# Patient Record
Sex: Male | Born: 1940 | Race: Black or African American | Hispanic: No | Marital: Married | Smoking: Former smoker
Health system: Southern US, Community
[De-identification: ages and names within clinical notes are randomized; demographics above are authoritative.]

## PROBLEM LIST (undated history)

## (undated) DIAGNOSIS — Z860100 Personal history of colon polyps, unspecified: Secondary | ICD-10-CM

## (undated) DIAGNOSIS — I2510 Atherosclerotic heart disease of native coronary artery without angina pectoris: Secondary | ICD-10-CM

## (undated) DIAGNOSIS — R7303 Prediabetes: Secondary | ICD-10-CM

## (undated) DIAGNOSIS — I10 Essential (primary) hypertension: Secondary | ICD-10-CM

## (undated) DIAGNOSIS — M1990 Unspecified osteoarthritis, unspecified site: Secondary | ICD-10-CM

## (undated) DIAGNOSIS — K5732 Diverticulitis of large intestine without perforation or abscess without bleeding: Secondary | ICD-10-CM

## (undated) DIAGNOSIS — R739 Hyperglycemia, unspecified: Secondary | ICD-10-CM

## (undated) DIAGNOSIS — K627 Radiation proctitis: Secondary | ICD-10-CM

## (undated) DIAGNOSIS — E785 Hyperlipidemia, unspecified: Secondary | ICD-10-CM

## (undated) DIAGNOSIS — E119 Type 2 diabetes mellitus without complications: Secondary | ICD-10-CM

## (undated) DIAGNOSIS — C61 Malignant neoplasm of prostate: Secondary | ICD-10-CM

## (undated) DIAGNOSIS — L02419 Cutaneous abscess of limb, unspecified: Secondary | ICD-10-CM

## (undated) HISTORY — DX: Cutaneous abscess of limb, unspecified: L02.419

## (undated) HISTORY — PX: COLONOSCOPY W/ POLYPECTOMY: SHX1380

## (undated) HISTORY — DX: Malignant neoplasm of prostate: C61

## (undated) HISTORY — DX: Atherosclerotic heart disease of native coronary artery without angina pectoris: I25.10

## (undated) HISTORY — DX: Essential (primary) hypertension: I10

## (undated) HISTORY — DX: Personal history of colonic polyps: Z86.010

## (undated) HISTORY — DX: Type 2 diabetes mellitus without complications: E11.9

## (undated) HISTORY — DX: Unspecified osteoarthritis, unspecified site: M19.90

## (undated) HISTORY — DX: Personal history of colon polyps, unspecified: Z86.0100

## (undated) HISTORY — DX: Radiation proctitis: K62.7

## (undated) HISTORY — DX: Diverticulitis of large intestine without perforation or abscess without bleeding: K57.32

## (undated) HISTORY — PX: COLONOSCOPY: SHX174

## (undated) HISTORY — DX: Hyperglycemia, unspecified: R73.9

## (undated) HISTORY — DX: Hyperlipidemia, unspecified: E78.5

---

## 2002-11-22 DIAGNOSIS — C61 Malignant neoplasm of prostate: Secondary | ICD-10-CM

## 2002-11-22 HISTORY — DX: Malignant neoplasm of prostate: C61

## 2004-01-20 ENCOUNTER — Encounter: Admission: RE | Admit: 2004-01-20 | Discharge: 2004-04-19 | Payer: Self-pay | Admitting: Internal Medicine

## 2004-09-14 ENCOUNTER — Ambulatory Visit (HOSPITAL_COMMUNITY): Admission: RE | Admit: 2004-09-14 | Discharge: 2004-09-14 | Payer: Self-pay | Admitting: Urology

## 2004-09-14 IMAGING — CR DG ABDOMEN 1V
1 series · 1 of 1 positions shown · non-contrast
Comparison: none

CLINICAL DATA: Left ureteral calculus. Preop for lithotripsy.

[view not recorded]
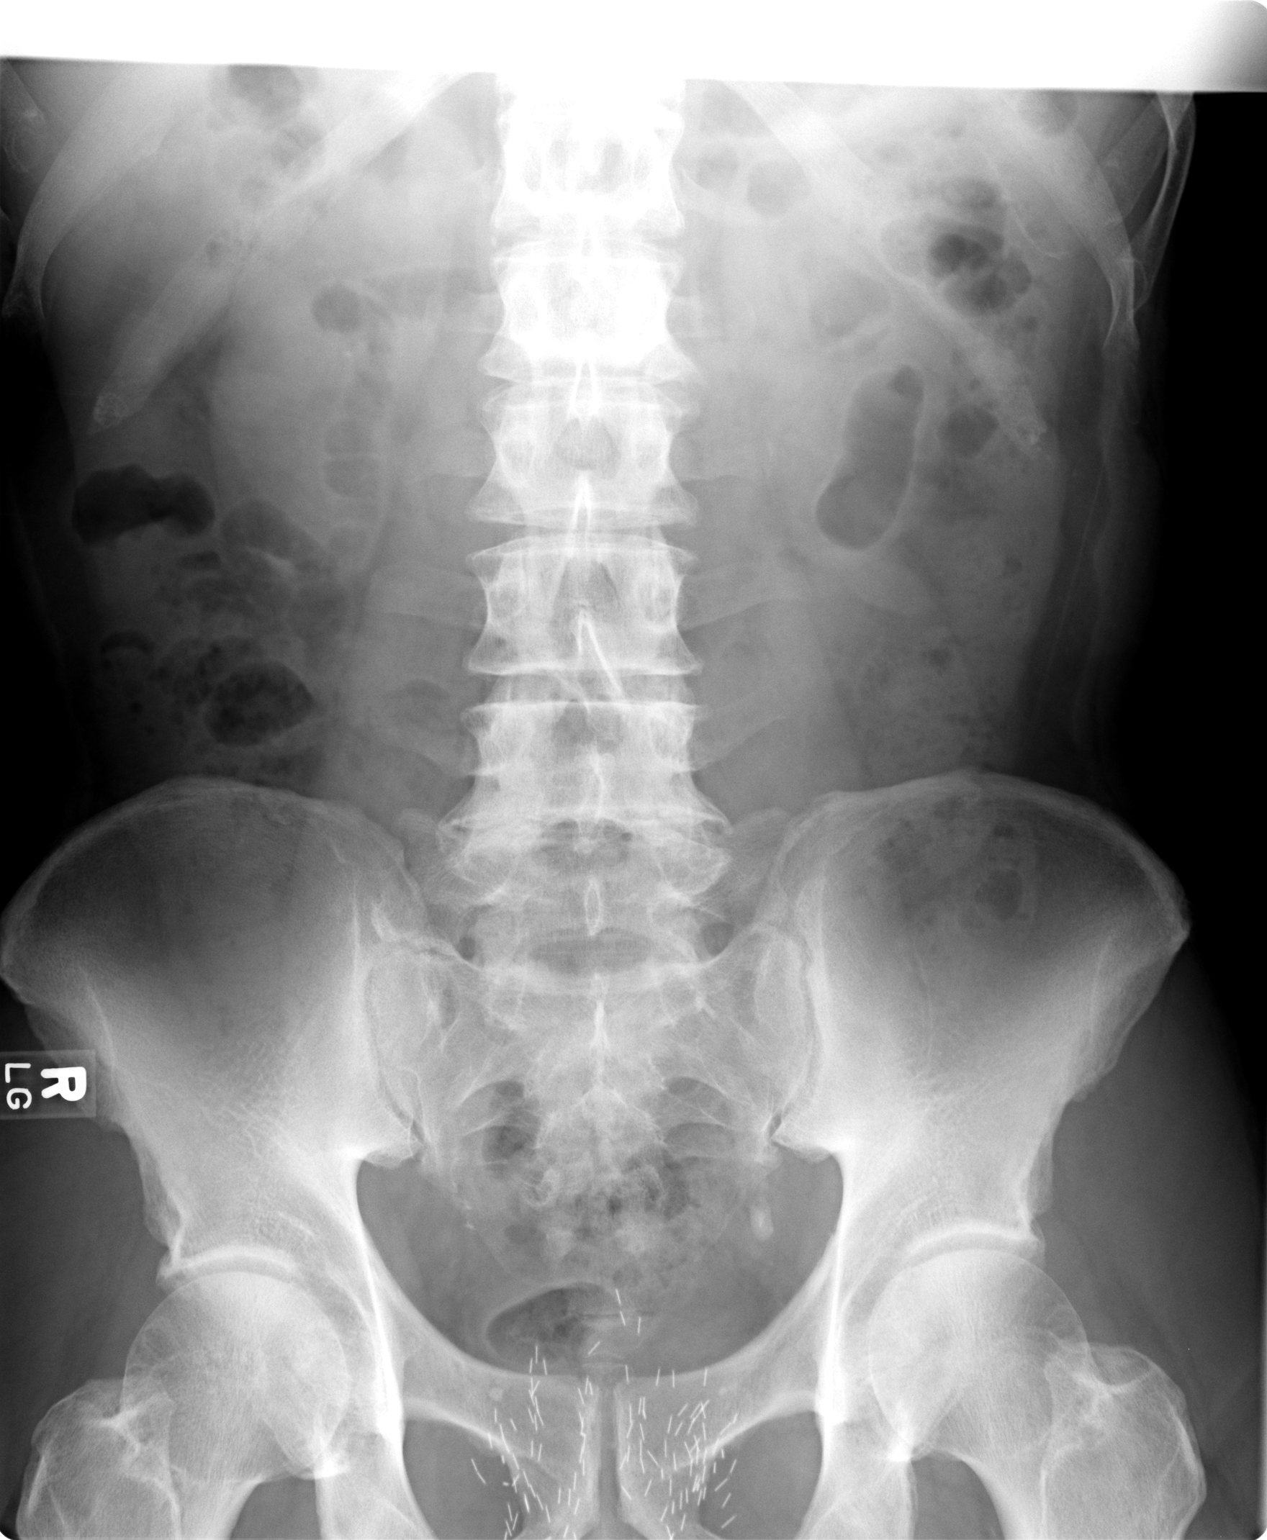

[1 of 1 positions shown; findings below may reference images not displayed]

ABDOMEN - ONE VIEW

There are no prior studies for comparison.

A tiny 3 mm calculus is noted in the mid pole of the right kidney, and a probable tiny 2 mm
calculus is also noted in the upper pole of the left kidney.

A large ureteral calcification is seen in the left pelvis over the expected course of the distal
left ureter and this measures 5 x 12 mm. Several tiny calcifications are also seen in the right
pelvis, which are of uncertain etiology. These may represent vascular calcifications, although
ureteral calculi cannot definitely be excluded. 

There is no evidence of dilated bowel loops. Prostate radioactive seed implants are noted.

IMPRESSION 

1. 5 x 12 mm left pelvic calcification, suspicious for distal left ureteral calculus. 

2. Several tiny calcifications in the right pelvis are indeterminate, and may represent distal
right ureteral calculi versus vascular calcification.

3. Tiny bilateral intrarenal calculi.

## 2005-09-21 ENCOUNTER — Ambulatory Visit: Payer: Self-pay | Admitting: Internal Medicine

## 2005-10-06 ENCOUNTER — Encounter (INDEPENDENT_AMBULATORY_CARE_PROVIDER_SITE_OTHER): Payer: Self-pay | Admitting: *Deleted

## 2005-10-06 ENCOUNTER — Ambulatory Visit: Payer: Self-pay | Admitting: Internal Medicine

## 2007-07-12 ENCOUNTER — Ambulatory Visit: Payer: Self-pay | Admitting: Vascular Surgery

## 2008-04-17 ENCOUNTER — Observation Stay: Payer: TRICARE For Life (TFL) | Admitting: Internal Medicine

## 2008-04-17 ENCOUNTER — Other Ambulatory Visit: Payer: Self-pay

## 2008-04-17 IMAGING — CT CT HEAD WITHOUT CONTRAST
2 series · 16 of 30 positions shown, 20 images · non-contrast
Comparison: none

REASON FOR EXAM: dizzy
COMMENTS:

[Series 2: without · axial · non-contrast · 0.40mm/px · z∈[+8,+128]mm · 13 of 29 slices shown, 17 images]
[im 3/29  brain]
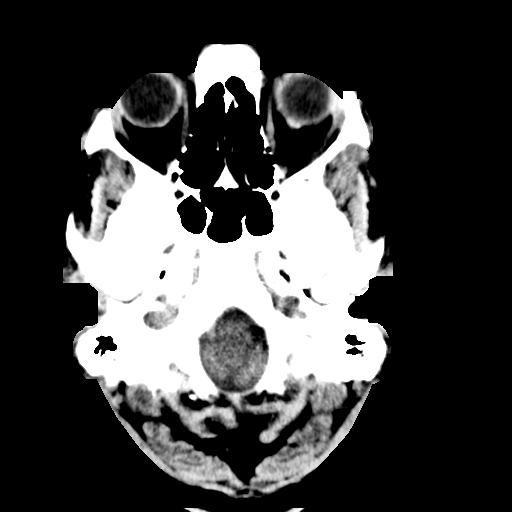
[im 3/29  bone]
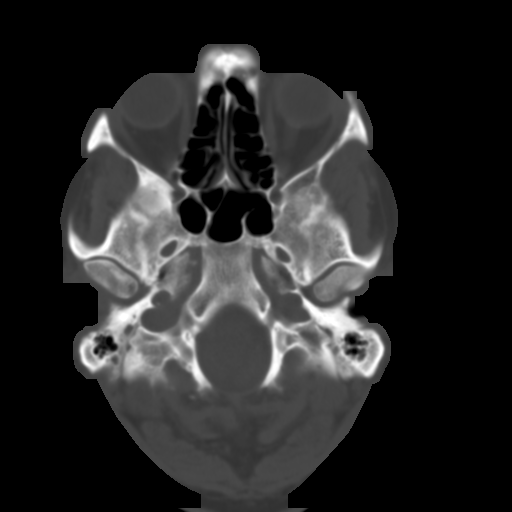
[im 5/29  brain]
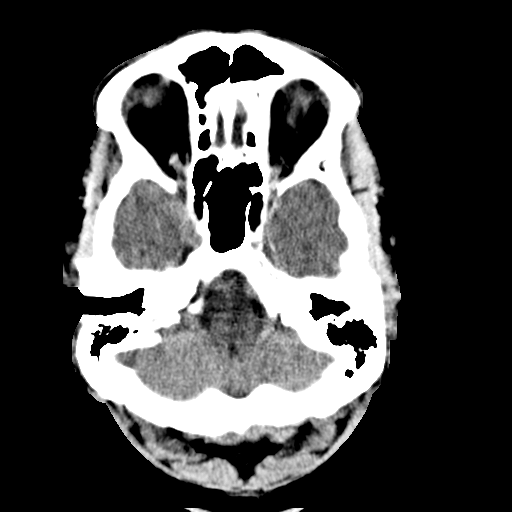
[im 7/29  brain]
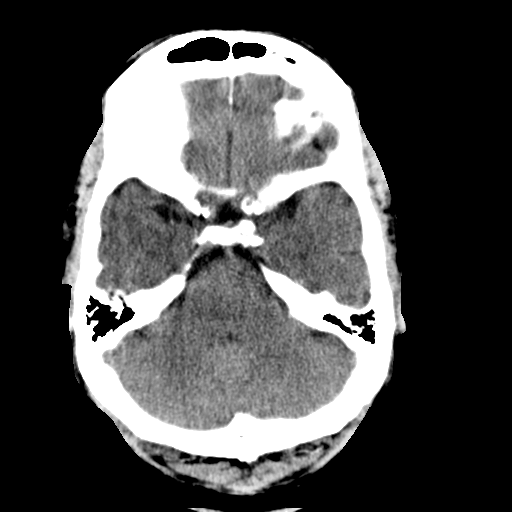
[im 9/29  brain]
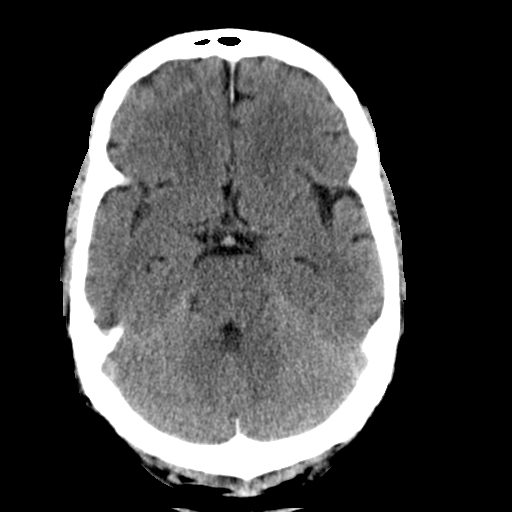
[im 11/29  brain]
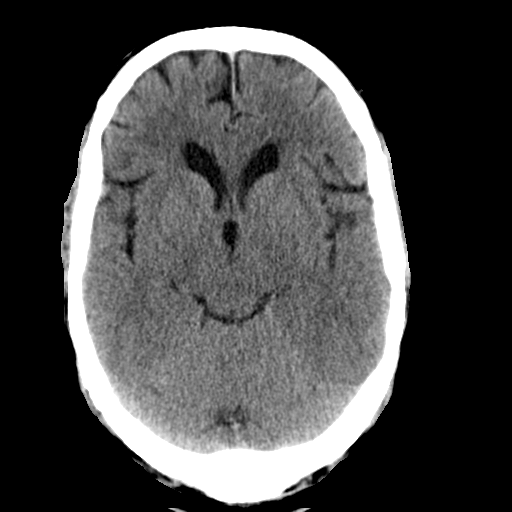
[im 11/29  bone]
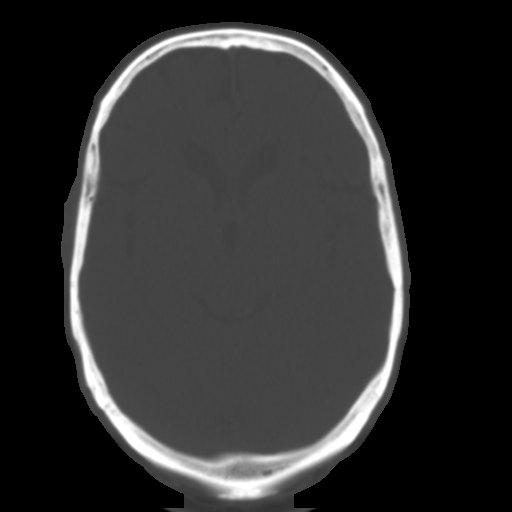
[im 13/29  brain]
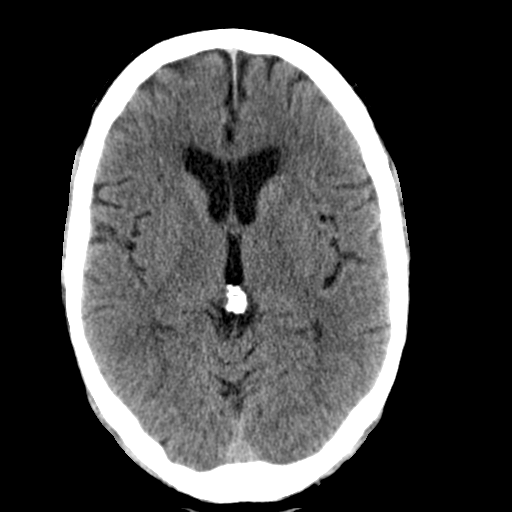
[im 15/29  brain]
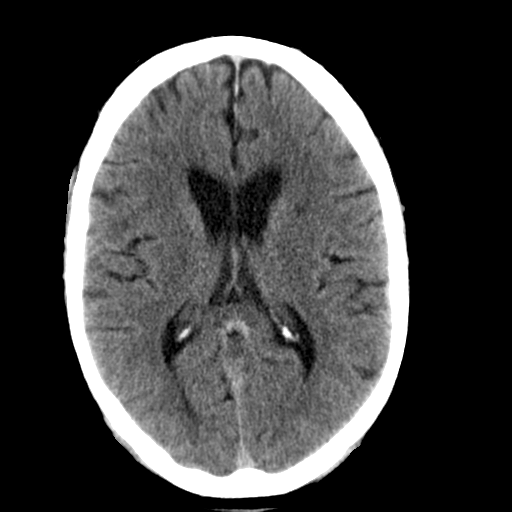
[im 17/29  brain]
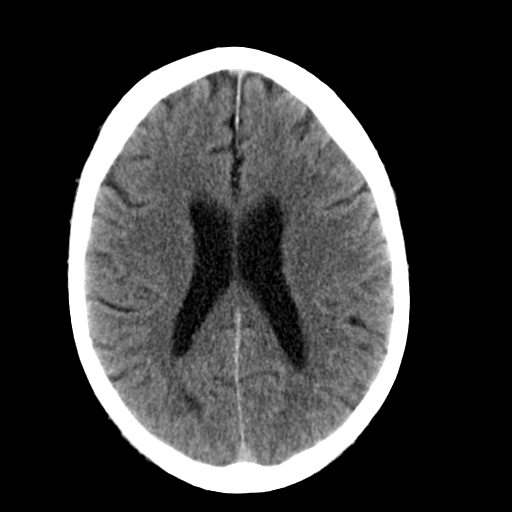
[im 19/29  brain]
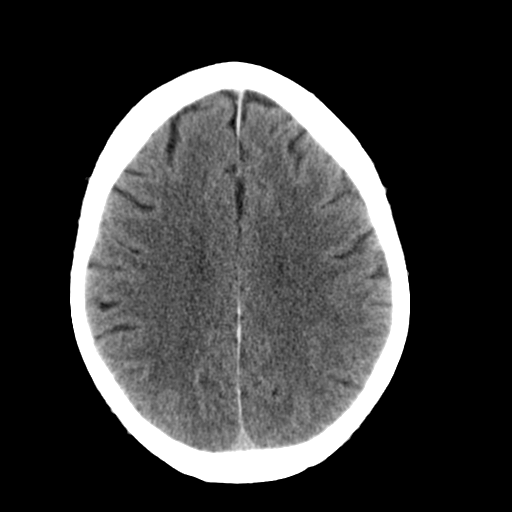
[im 19/29  bone]
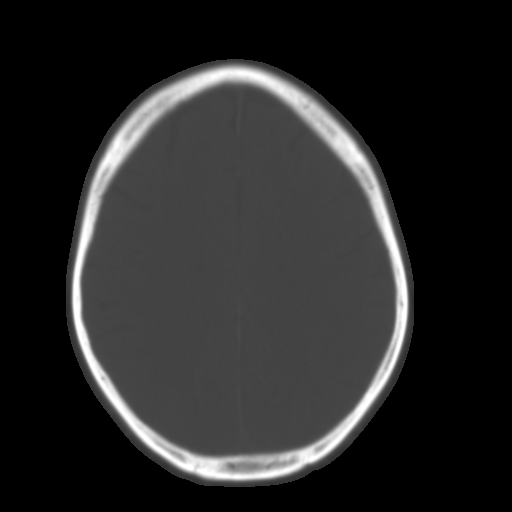
[im 21/29  brain]
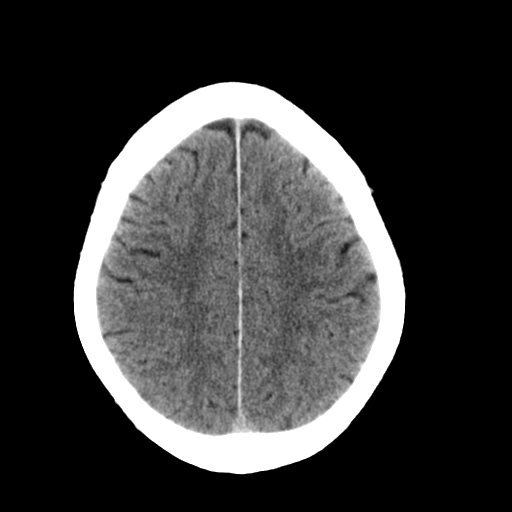
[im 23/29  brain]
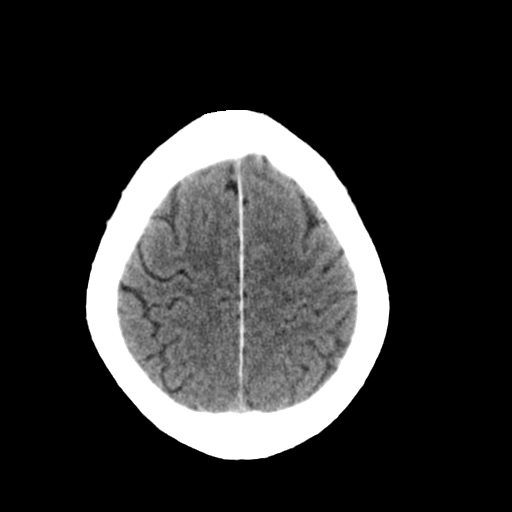
[im 25/29  brain]
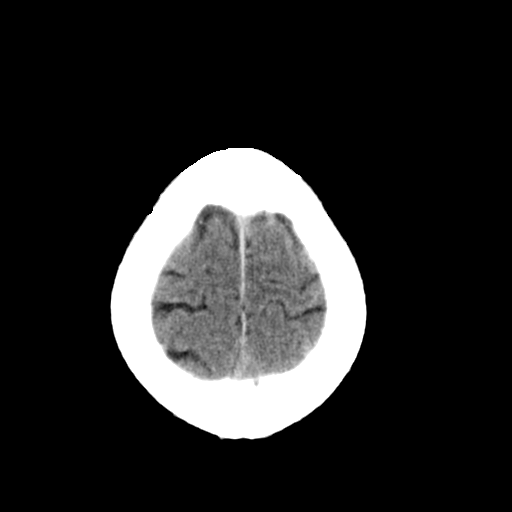
[im 27/29  brain]
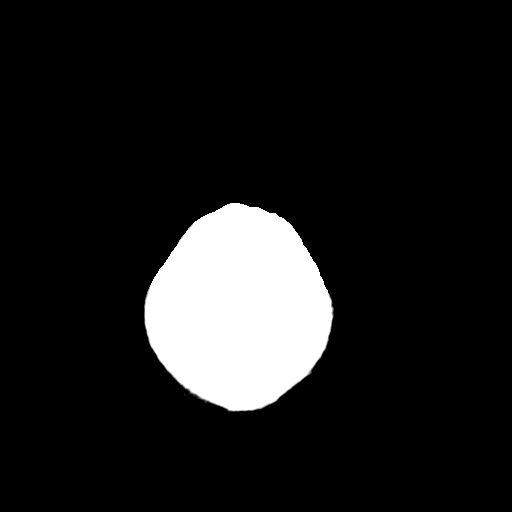
[im 27/29  bone]
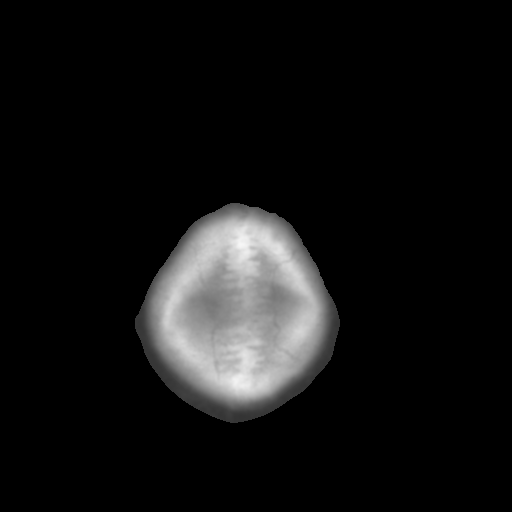

[Series 3: bone · axial · 0.40mm/px · z∈[+8,+48]mm · 3 of 29 slices shown]
[im 3/29  bone]
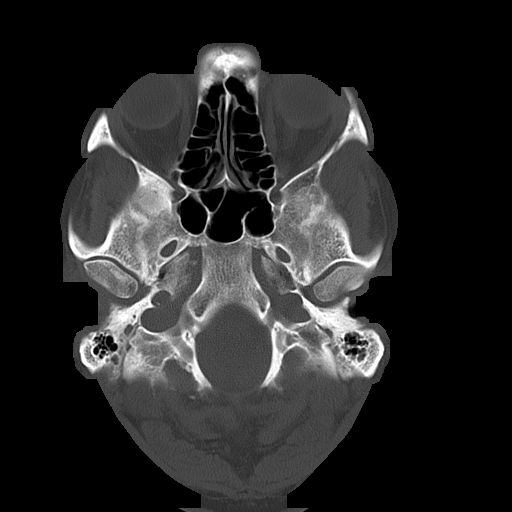
[im 7/29  bone]
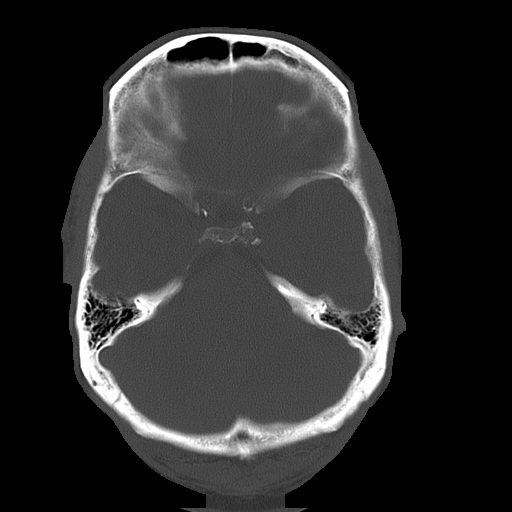
[im 11/29  bone]
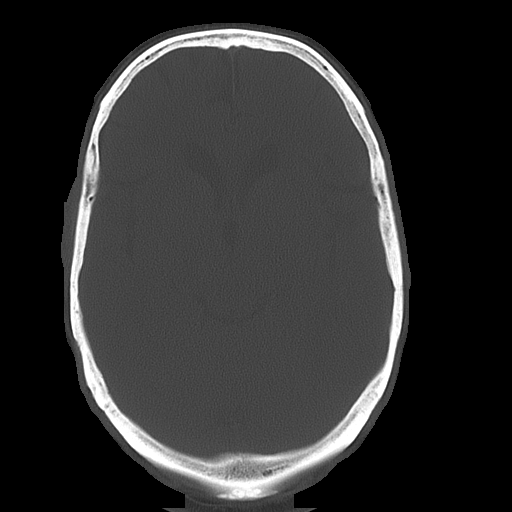

[16 of 30 positions shown; findings below may reference images not displayed]

PROCEDURE:     CT  - CT HEAD WITHOUT CONTRAST  - [DATE]  [DATE]

RESULT:     There is no evidence of intraaxial nor extraaxial fluid
collections nor evidence of acute hemorrhage. No secondary signs are
appreciated to suggest mass-effect, subacute or chronic infarction. The
visualized bony skeleton demonstrates no evidence of fracture nor
dislocation.
IMPRESSION: Head CT without evidence of focal or acute intracranial
abnormalities.

Dr. XONN, of the emergency department, was informed of these findings at
the time of the initial interpretation.

## 2008-04-22 HISTORY — PX: CORONARY ANGIOPLASTY WITH STENT PLACEMENT: SHX49

## 2008-04-26 ENCOUNTER — Ambulatory Visit: Payer: Self-pay | Admitting: Cardiology

## 2008-04-26 LAB — CONVERTED CEMR LAB
ALT: 32 units/L (ref 0–53)
AST: 23 units/L (ref 0–37)
Albumin: 4.8 g/dL (ref 3.5–5.2)
Alkaline Phosphatase: 43 units/L (ref 39–117)
BUN: 18 mg/dL (ref 6–23)
Basophils Absolute: 0 10*3/uL (ref 0.0–0.1)
Basophils Relative: 1 % (ref 0–1)
CO2: 24 meq/L (ref 19–32)
Calcium: 10.1 mg/dL (ref 8.4–10.5)
Chloride: 106 meq/L (ref 96–112)
Creatinine, Ser: 1.12 mg/dL (ref 0.40–1.50)
Eosinophils Absolute: 0.1 10*3/uL (ref 0.0–0.7)
Eosinophils Relative: 2 % (ref 0–5)
Glucose, Bld: 85 mg/dL (ref 70–99)
HCT: 46 % (ref 39.0–52.0)
Hemoglobin: 15.3 g/dL (ref 13.0–17.0)
INR: 1.1 (ref 0.0–1.5)
Lymphocytes Relative: 53 % — ABNORMAL HIGH (ref 12–46)
Lymphs Abs: 1.8 10*3/uL (ref 0.7–4.0)
MCHC: 33.3 g/dL (ref 30.0–36.0)
MCV: 79 fL (ref 78.0–100.0)
Monocytes Absolute: 0.4 10*3/uL (ref 0.1–1.0)
Monocytes Relative: 11 % (ref 3–12)
Neutro Abs: 1.2 10*3/uL — ABNORMAL LOW (ref 1.7–7.7)
Neutrophils Relative %: 34 % — ABNORMAL LOW (ref 43–77)
Platelets: 158 10*3/uL (ref 150–400)
Potassium: 3.8 meq/L (ref 3.5–5.3)
Prothrombin Time: 14.1 s (ref 11.6–15.2)
RBC: 5.82 M/uL — ABNORMAL HIGH (ref 4.22–5.81)
RDW: 15.3 % (ref 11.5–15.5)
Sodium: 142 meq/L (ref 135–145)
Total Bilirubin: 0.5 mg/dL (ref 0.3–1.2)
Total Protein: 8 g/dL (ref 6.0–8.3)
WBC: 3.4 10*3/uL — ABNORMAL LOW (ref 4.0–10.5)
aPTT: 35 s (ref 24–37)

## 2008-05-02 ENCOUNTER — Ambulatory Visit: Payer: Self-pay | Admitting: Cardiovascular Disease

## 2008-05-02 ENCOUNTER — Inpatient Hospital Stay (HOSPITAL_COMMUNITY): Admission: RE | Admit: 2008-05-02 | Discharge: 2008-05-03 | Payer: Self-pay | Admitting: Cardiovascular Disease

## 2008-05-02 ENCOUNTER — Encounter: Admission: RE | Admit: 2008-05-02 | Discharge: 2008-05-03 | Payer: Self-pay | Admitting: Cardiovascular Disease

## 2008-05-02 ENCOUNTER — Ambulatory Visit: Payer: Self-pay | Admitting: Cardiology

## 2008-05-02 ENCOUNTER — Inpatient Hospital Stay (HOSPITAL_BASED_OUTPATIENT_CLINIC_OR_DEPARTMENT_OTHER): Admission: RE | Admit: 2008-05-02 | Discharge: 2008-05-02 | Payer: Self-pay | Admitting: Cardiology

## 2008-05-13 ENCOUNTER — Ambulatory Visit: Payer: Self-pay | Admitting: Cardiology

## 2008-07-08 ENCOUNTER — Encounter: Payer: Self-pay | Admitting: Cardiology

## 2008-08-01 ENCOUNTER — Ambulatory Visit: Payer: Self-pay | Admitting: Internal Medicine

## 2008-08-02 ENCOUNTER — Encounter: Payer: Self-pay | Admitting: Cardiology

## 2008-08-02 LAB — CONVERTED CEMR LAB
BUN: 22 mg/dL (ref 6–23)
CO2: 24 meq/L (ref 19–32)
Calcium: 10 mg/dL (ref 8.4–10.5)
Chloride: 103 meq/L (ref 96–112)
Creatinine, Ser: 1.12 mg/dL (ref 0.40–1.50)
Glucose, Bld: 99 mg/dL (ref 70–99)
Potassium: 3.7 meq/L (ref 3.5–5.3)
Sodium: 140 meq/L (ref 135–145)

## 2008-08-05 ENCOUNTER — Ambulatory Visit: Payer: Self-pay | Admitting: Cardiology

## 2008-08-29 DIAGNOSIS — C61 Malignant neoplasm of prostate: Secondary | ICD-10-CM | POA: Insufficient documentation

## 2008-08-29 DIAGNOSIS — K5730 Diverticulosis of large intestine without perforation or abscess without bleeding: Secondary | ICD-10-CM | POA: Insufficient documentation

## 2008-08-29 DIAGNOSIS — I10 Essential (primary) hypertension: Secondary | ICD-10-CM | POA: Insufficient documentation

## 2008-08-29 DIAGNOSIS — D126 Benign neoplasm of colon, unspecified: Secondary | ICD-10-CM | POA: Insufficient documentation

## 2008-08-29 DIAGNOSIS — I2510 Atherosclerotic heart disease of native coronary artery without angina pectoris: Secondary | ICD-10-CM | POA: Insufficient documentation

## 2008-08-29 DIAGNOSIS — E785 Hyperlipidemia, unspecified: Secondary | ICD-10-CM

## 2008-08-29 DIAGNOSIS — E1169 Type 2 diabetes mellitus with other specified complication: Secondary | ICD-10-CM | POA: Insufficient documentation

## 2008-09-03 ENCOUNTER — Ambulatory Visit: Payer: Self-pay | Admitting: Internal Medicine

## 2008-09-03 DIAGNOSIS — K520 Gastroenteritis and colitis due to radiation: Secondary | ICD-10-CM | POA: Insufficient documentation

## 2008-09-10 ENCOUNTER — Ambulatory Visit: Payer: Self-pay | Admitting: Internal Medicine

## 2008-09-10 ENCOUNTER — Encounter: Payer: Self-pay | Admitting: Internal Medicine

## 2008-09-12 ENCOUNTER — Encounter: Payer: Self-pay | Admitting: Internal Medicine

## 2009-04-10 ENCOUNTER — Ambulatory Visit: Payer: Self-pay | Admitting: Cardiology

## 2010-03-30 ENCOUNTER — Ambulatory Visit: Payer: Self-pay | Admitting: Cardiology

## 2010-12-22 NOTE — Assessment & Plan Note (Signed)
Summary: 1 yr rov pfh   Visit Type:  Follow-up Primary Provider:  Marisue Brooklyn DO.  CC:  CAD.  History of Present Illness: The patient presents for followup of his known coronary disease. Since I last saw him his only complaint has been swelling in his ankles and wrists. He says this causes a little stiffness but is not overwhelmingly uncomfortable. He exercises a couple of times per week and denies any chest pressure, neck or arm discomfort. He denies any palpitations, presyncope or syncope. He denies any shortness of breath, PND or orthopnea. He did have lipids drawn recently but he forgot to bring these results.  Current Medications (verified): 1)  Plavix 75 Mg Tabs (Clopidogrel Bisulfate) .Marland Kitchen.. 1 Tablet By Mouth Once Daily 2)  Aspirin Ec 325 Mg Tbec (Aspirin) .Marland Kitchen.. 1 Tablet By Mouth Once Daily 3)  Flomax 0.4 Mg Xr24h-Cap (Tamsulosin Hcl) .Marland Kitchen.. 1 Tablet By Mouth Once Daily 4)  Amlodipine Besylate 10 Mg Tabs (Amlodipine Besylate) .Marland Kitchen.. 1 Tablet By Mouth Once Daily 5)  Citalopram Hydrobromide 40 Mg Tabs (Citalopram Hydrobromide) .... 1/2 Tablet By Mouth Once Daily 6)  Zolpidem Tartrate 10 Mg Tabs (Zolpidem Tartrate) .Marland Kitchen.. 1 Tablet By Mouth At Bedtime 7)  Hydrochlorothiazide 25 Mg Tabs (Hydrochlorothiazide) .Marland Kitchen.. 1 Tablet By Mouth Once Daily 8)  Vitamin B-6 50 Mg Tabs (Pyridoxine Hcl) .... Take One Tablet By Mouth Once Daily. 9)  Risperidone 2 Mg Tabs (Risperidone) .... 1/2 Tablet At Bedtime 10)  Simvastatin 80 Mg Tabs (Simvastatin) .... 1/2 Tablet By Mouth At Bedtime 11)  Nitroglycerin 0.4 Mg Subl (Nitroglycerin) .... One Tablet Under Tongue Every 5 Minutes As Needed For Chest Pain---May Repeat Times Three 12)  Ergocalciferol 50000 Unit Caps (Ergocalciferol) .Marland Kitchen.. 1 Tablet Q Week 13)  Cialis 10 Mg Tabs (Tadalafil) .... As Needed  Allergies (verified): No Known Drug Allergies  Past History:  Past Medical History: COLONIC POLYPS (ICD-211.3) DIVERTICULOSIS, COLON (ICD-562.10) CAD  (ICD-414.00) (99% stenosis in the mid-right coronary artery with 80% to 90% diffuse   stenosis more proximally.  There is also a 99% stenosis in the distal   left circumflex.  EF was 60% with mild inferior hypokinesis.  He then underwent successful PCI   and stenting of the left circumflex with placement of a Psychologist, occupational Platinum Study drug-eluting stent.) ADENOCARCINOMA, PROSTATE (ICD-185)status post seed implantation HYPERLIPIDEMIA (ICD-272.4) HYPERTENSION (ICD-401.9) Radiation proctitis with chronic bleeding  Past Surgical History: Reviewed history from 04/08/2009 and no changes required. Angioplasty/stent  6/09  Review of Systems       As stated in the HPI and negative for all other systems.   Vital Signs:  Patient profile:   70 year old male Height:      69 inches Weight:      191 pounds BMI:     28.31 Pulse rate:   62 / minute Resp:     16 per minute BP sitting:   138 / 70  (right arm)  Vitals Entered By: Marrion Coy, CNA (Mar 30, 2010 11:41 AM)  Physical Exam  General:  Well developed, well nourished, in no acute distress. Head:  normocephalic and atraumatic Eyes:  PERRLA/EOM intact; conjunctiva and lids normal. Mouth:  Teeth, gums and palate normal. Oral mucosa normal. Neck:  Neck supple, no JVD. No masses, thyromegaly or abnormal cervical nodes. Chest Wall:  no deformities or breast masses noted Lungs:  Clear bilaterally to auscultation and percussion. Abdomen:  Bowel sounds positive; abdomen soft and non-tender without masses, organomegaly,  or hernias noted. No hepatosplenomegaly. Msk:  Back normal, normal gait. Muscle strength and tone normal. Extremities:  mild ankle edema, nonpitting Neurologic:  Alert and oriented x 3. Skin:  Intact without lesions or rashes. Psych:  Normal affect.   Detailed Cardiovascular Exam  Neck    Carotids: Carotids full and equal bilaterally without bruits.      Neck Veins: Normal, no JVD.    Heart    Inspection:  no deformities or lifts noted.      Palpation: normal PMI with no thrills palpable.      Auscultation: regular rate and rhythm, S1, S2 without murmurs, rubs, gallops, or clicks.    Vascular    Abdominal Aorta: no palpable masses, pulsations, or audible bruits.      Femoral Pulses: normal femoral pulses bilaterally.      Pedal Pulses: normal pedal pulses bilaterally.      Radial Pulses: normal radial pulses bilaterally.      Peripheral Circulation: no clubbing, cyanosis, with normal capillary refill.     EKG  Procedure date:  03/30/2010  Findings:      sinus rhythm, rate 63, left axis deviation, early transition lead one consistent with abnormal lead placement, minimal voltage criteria for LVH  Impression & Recommendations:  Problem # 1:  EDEMA (ICD-782.3) I suspect his edema is related to the Norvasc. We discussed stopping this. He does not want to try that as his blood pressure is well controlled and the swelling is not overly bothersome. He will let me know if he changes his mind.  Problem # 2:  CAD (ICD-414.00) He has no new symptoms. He will continue the Plavix indefinitely. No new evaluations are necessary. Orders: EKG w/ Interpretation (93000)  Problem # 3:  HYPERLIPIDEMIA (ICD-272.4) He is having his lipids followed by his primary care. I would be happy to review these but he forgot the blood work that was drawn recently. The goal should be an LDL less than 100 and HDL greater than 40.  Patient Instructions: 1)  Your physician recommends that you schedule a follow-up appointment in: 1 year with Dr Antoine Poche 2)  Your physician recommends that you continue on your current medications as directed. Please refer to the Current Medication list given to you today.

## 2011-04-06 NOTE — Cardiovascular Report (Signed)
[name] Colon, [name] Colon             ACCOUNT NO.:  [id number]   MEDICAL RECORD NO.:  [id number]          PATIENT TYPE:  OIB   LOCATION:  1961                         FACILITY:  [facility]   PHYSICIAN:  Rollene Rotunda, MD, FACCDATE OF BIRTH:  08-05-1941   DATE OF PROCEDURE:  05/02/2008  DATE OF DISCHARGE:  05/02/2008                            CARDIAC CATHETERIZATION   PRIMARY CARE PHYSICIAN:  Lovenia Kim, DO   CARDIOLOGIST:  Rollene Rotunda, MD, [facility]   PROCEDURE:  Left heart catheterization/coronary arteriography.   INDICATIONS:  The patient with new onset chest pain (unstable angina)  and a Cardiolite suggesting inferior wall ischemia.   PROCEDURE:  Left heart catheterization was performed via the right  femoral artery.  The artery was cannulated using anterior wall puncture.  A #4-French arterial sheath was inserted via the modified Seldinger  technique.  Preformed Judkins and a pigtail catheter were utilized.  The  patient tolerated the procedure well and left the lab in stable  condition.   RESULTS:  Hemodynamics LV 139/25, AO 139/1.  Coronaries, left main at  25% stenosis.  The LAD had a proximal long 25% stenosis.  First diagonal  was very large and wrapped the apex.  There was long proximal 30-40%  stenosis.  The circumflex in the AV groove had diffuse long luminal  irregularities 20% stenosis.  There was a distal long 99% stenosis  before a moderate-sized posterolateral.  First obtuse marginal was very  small and normal.  Second obtuse marginal was large and branching with  diffuse luminal irregularities.  The right coronary artery was dominant,  but moderate-sized vessel.  There were proximal and mid diffuse lesions  with scattered 80-90% lesions and a mid 99% stenosis.  PDA was moderate  size and normal.   Left ventriculogram:  Left ventriculogram was obtained in the RAO  projection.  The EF of 60% with mild inferior hypokinesis.   Distal aortogram:  Distal aortogram  was obtained, secondary to  difficulty advancing the guidewire.   NOTE:  All catheter exchanges were made with a retained guidewire.  The  distal aortogram demonstrated, he had diffuse nonobstructive 25% plaque  in the right greater than left iliac.  Renal arteries appeared to have  some mild nonobstructive plaquing bilaterally.   CONCLUSION:  Severe two-vessel coronary artery disease.  Mild bilateral  iliac plaque.  Well-preserved ejection fraction with minor inferior wall  motion abnormality.   PLAN:  We will refer the patient to Dr. Excell Seltzer for consideration of  percutaneous revascularization of the circumflex lesion, which is a  discrete lesion compromising a moderate size vessel.  We will also  consider PCI of the  right coronary artery.  However, this is diffuse disease and appears to  be more chronic.  This may be difficult to treat percutaneously and we  may require medical management that I will review with Dr. Excell Seltzer.  Regardless, the patient needs aggressive secondary risk reduction.      Rollene Rotunda, MD, [facility]  Electronically Signed     JH/MEDQ  D:  05/02/2008  T:  05/02/2008  Job:  [id number]  cc:   Lovenia Kim, D.O.

## 2011-04-06 NOTE — Procedures (Signed)
CAROTID DUPLEX EXAM   INDICATION:  Carotid bruit.   HISTORY:  Diabetes:  Diet controlled  Cardiac:  No  Hypertension:  Yes  Smoking:  No quit in 1994  Previous Surgery:  No  CV History:  Amaurosis Fugax No, Paresthesias No, Hemiparesis No   REFERRING PHYSICIAN:  Dr. Marisue Brooklyn                                       RIGHT             LEFT  Brachial systolic pressure:         134               138  Brachial Doppler waveforms:         Biphasic          Biphasic  Vertebral direction of flow:        Antegrade         Antegrade  DUPLEX VELOCITIES (cm/sec)  CCA peak systolic                   73                97  ECA peak systolic                   77                94  ICA peak systolic                   42                54  ICA end diastolic                   11                18  PLAQUE MORPHOLOGY:                  None              None  PLAQUE AMOUNT:                      None              None  PLAQUE LOCATION:                    None              None   IMPRESSION:  1. No internal carotid artery stenosis bilaterally.  2. A preliminary copy was faxed to Dr. Hardie Pulley office.   ___________________________________________  Larina Earthly, M.D.   DP/MEDQ  D:  07/12/2007  T:  07/13/2007  Job:  [id number]

## 2011-04-06 NOTE — Letter (Signed)
May 13, 2008    Camila Li, PA  Ambulatory Care, [facility],  [address]  [place], [place] [address]   RE:  MEZIAH, BLASINGAME  MRN:  [id number]  /  DOB:  12-13-1940   Dear Ms. Gates:   Mr. Iovino is under my care for coronary artery disease.  He was  recently diagnosed with this when he had some chest discomfort and an  abnormal stress test.  He was subsequently found to have 2-vessel  coronary artery disease on cardiac catheterization and underwent  successful angioplasty and stenting of a lesion in the circumflex.  He  does have obstructive coronary disease in his right coronary artery and  is being managed for this.  He has a well-preserved ejection fraction.  Currently, he has no unstable symptoms.  He has had no arrhythmias or  valvular abnormalities.  He has not had any syncope.  His blood pressure  is well controlled.  No other cardiovascular problems that would  preclude him from driving in accordance with Department of [facility]  Administration Guidelines.   I hope this letter is helpful.  I do think that he should be cleared to  drive.  I would be happy to discuss this further.  Please call my office  at [phone].    Sincerely,      Rollene Rotunda, MD, [facility]  Electronically Signed    JH/MedQ  DD: 05/13/2008  DT: 05/14/2008  Job #: [id number]

## 2011-04-06 NOTE — Assessment & Plan Note (Signed)
[facility] OFFICE NOTE   NAME:[name] Colon, [name] Colon                      MRN:          [id number]  DATE:04/26/2008                            DOB:          11/14/1941    PRIMARY:  Marisue Brooklyn.   REASON FOR PRESENTATION:  Evaluate patient with an abnormal stress test  and chest discomfort.   HISTORY OF PRESENT ILLNESS:  The patient is a 70 year old gentleman  without prior cardiac history.  He was taking a trip to [place], now  about 10 days ago.  He had an episode of substernal chest pain while  driving.  This was severe.  It was a heaviness.  It lasted about 5  minutes.  He had never had this before.  There was no radiation to his  neck or to his arms.  He had a warm fuzzy feeling after that.  He  thought it was significant and he might have to pull over but he did not  and kept driving.  It went away on its own.  He had never had this  before.  He subsequently went to [facility] and came back.  However, when  he was trying to mow the lawn a few days after that he noticed chest  discomfort with activity.  He stopped what he was doing.  He fell  lightheaded the next day and on the 27th presented to [facility] Emergency  Room.  He ruled out for myocardial infarction, had no acute EKG changes.  However, he was seen by Dr. Juliann Pares and sent for a stress perfusion  study.  This demonstrated evidence of moderate inferior defect with  moderate redistribution suggestive of a possible ischemia.  The EF was  66%.  It was suggested that he have a cath but he wanted to come here  for another opinion.   Since that time the patient has been doing some activities but not as  much as he would like.  He is laid off a little bit until he gets his  evaluation.  He has not had any new discomfort, neck or arm discomfort.  He has not had any new palpitation, presyncope or syncope.   PAST MEDICAL HISTORY:  1. Hypertension since 1978.  2. Hyperlipidemia since 2003.  3. Diabetes mellitus controlled with diet.  4. Prostate cancer.  5. Post traumatic stress disorder.  6. Lumbar disc disease.  7. Nephrolithiasis.   PAST SURGICAL HISTORY:  1. Operation on four pulled muscles.  2. Radiation seed implants.   ALLERGIES:  NONE.   MEDICATIONS:  1. Lexapro 40 mg daily.  2. Norvasc 10 mg daily.  3. Hydrochlorothiazide 25 mg daily.  4. Pyridoxine.  5. Risperdal 0.5 mg nightly.  6. Simvastatin 80 mg daily.  7. Flomax.  8. Ambien.   SOCIAL HISTORY:  The patient is retired.  He was in the [employer] and is  disabled from this.  He is married and has two children.  He has four  living grandchildren.  He quit smoking in 1995 after smoking for 38  years.  His does not drink  alcohol.   FAMILY HISTORY:  Contributory for his father having peripheral vascular  disease in his 4s and dying of heart failure at 31.  He had brothers  who died of complications of diabetes.   REVIEW OF SYSTEMS:  As stated in the HPI and positive for some decreased  hearing, urinary incontinence and some hematuria with nephrolithiasis in  the past, back pain.  Negative for all other systems.   PHYSICAL EXAMINATION:  The patient is in no acute distress.  He has a  rather flat affect.  Blood pressure 140/76, weight 187 pounds, body mass  index 27.  HEENT:  Eyelids unremarkable, pupils equally round and reactive to  light, fundi not visualized, oral mucosa unremarkable.  NECK:  No jugular venous distention at 45 degrees, carotid upstroke  brisk and symmetric, no bruits, no thyromegaly.  LYMPHATICS:  No cervical, axillary, inguinal adenopathy.  LUNGS:  Clear to auscultation bilaterally.  BACK:  No costovertebral angle tenderness.  CHEST:  Unremarkable.  HEART:  PMI not displaced or sustained, S1 and S2 within normal limits,  no S3, no S4, no clicks, no rubs, no murmurs.  ABDOMEN:  Flat, positive bowel sounds normal in frequency and pitch, no  bruits,  no rebound, no guarding, no midline pulsatile mass, no  hepatomegaly, no splenomegaly.  SKIN:  No rashes, no nodules.  EXTREMITIES:  With 2+ pulses throughout, left soft femoral bruit, no  cyanosis, no clubbing, no edema.  NEURO:  Oriented to person, place, and time; cranial nerves II-XII  grossly intact, motor grossly intact throughout.   ASSESSMENT/PLAN:  1. Abnormal stress perfusion study and chest pain.  The patient's      pretest probability of obstructive coronary disease is quite high.      Given this, the next test should be a cardiac catheterization.  I      described the risks and benefits.  He understands the risk of      death, heart attack, stroke, bleeding, bruising, vascular trauma,      emboli, infection, renal insufficiency and other.  He agrees to      proceed.  He will lay off any physical activity until we have this      done and come to the ER if he does have any chest discomfort.  We      will give him a prescription for sublingual nitroglycerin.  He will      be taking aspirin which we will make sure we had to his regimen.      He will remain on the other medicines as listed.  2. Risk reduction.  He can have a fasting lipid profile at some point.      I will try to review the [place] records to see if he had this.      We need to treat this aggressively.  3. Hypertension.  Blood pressure is very slightly elevated.  He will      remain on the medications as listed.  4. Post traumatic stress disorder.  He has this under control and he      will remain on his medicines.  5. Followup will be at the time of his catheterization.     Rollene Rotunda, MD, [facility]  Electronically Signed    JH/MedQ  DD: 04/26/2008  DT: 04/26/2008  Job #: [id number]   cc:   Lovenia Kim, D.O.

## 2011-04-06 NOTE — Assessment & Plan Note (Signed)
[facility]                            CARDIOLOGY OFFICE NOTE   NAME:Colon, [name] [name] Colon                    MRN:          [id number]  DATE:08/05/2008                            DOB:          Oct 12, 1941    PRIMARY CARE PHYSICIAN:  [name] Colon, D.O.   REASON FOR PRESENTATION:  Evaluate the patient with coronary artery  disease.   HISTORY OF PRESENT ILLNESS:  The patient is a 70 year old gentleman who  presents for followup of his known coronary artery disease.  Since I  last saw him, he has been walking for exercise.  With his level of  activity, he denies any chest discomfort, neck or arm discomfort.  He  has had no palpitations, presyncope, or syncope.  He denies any PND or  orthopnea.  He will occasionally get some fleeting chest discomfort, but  no symptoms similar to his previous angina.   PAST MEDICAL HISTORY:  Coronary artery disease (EF 60% on  catheterization in June, left main had 25% stenosis, the LAD had 25%  stenosis, followed by 30-40% stenosis, circumflex had 20% stenosis, his  distal long 99% stenosis before moderate-sized posterolateral.  The  right coronary artery was dominant of moderate size with diffuse 80-90%  stenosis and mid 99% stenosis.  He had a drug-eluting stent to the left  circumflex), hypertension, dyslipidemia, good diet-controlled diabetes  mellitus, prostate cancer, posttraumatic stress disorder, lumbar disk  disease, radiation proctitis with seed implants.   ALLERGIES:  None.   MEDICATIONS:  1. Plavix 75 mg daily.  2. Aspirin 325 mg daily.  3. Tamsulosin.  4. Zolpidem.  5. Amlodipine 10 mg daily.  6. Citalopram.  7. Hydrochlorothiazide 25 mg daily.  8. Paroxetine.  9. Risperidone.  10.Simvastatin 40 mg at bedtime.   REVIEW OF SYSTEMS:  As stated in the HPI and otherwise negative for  other systems.   PHYSICAL EXAMINATION:  GENERAL:  The patient is in no distress.  VITAL SIGNS:  Blood pressure  137/78, heart rate 57 and regular, weight  192 pounds, body mass index 28.  HEENT:  Eyelids unremarkable.  Pupils, equal, round, reactive to light.  Fundi not visualized.  Oral mucosa unremarkable.  NECK:  No jugular venous distention at 45 degrees.  Carotid upstroke  brisk and symmetric.  No bruits, no thyromegaly.  LYMPHATICS:  No cervical, axillary, or inguinal adenopathy.  LUNGS:  Clear to auscultation bilaterally.  BACK:  No costovertebral angle tenderness.  CHEST:  Unremarkable.  HEART:  PMI not displaced on sustained.  S1 and S2 within normal limits.  No S3, no S4, no clicks, no rubs, no murmurs.  ABDOMEN:  Flat, positive bowel sounds.  Normal in frequency and pitch,  no bruits, no guarding, no midline pulsatile mass, no hepatomegaly, no  splenomegaly.  SKIN:  No rashes, no nodules.  EXTREMITIES:  2+  pulses, no edema.   EKG sinus rhythm rate 58, axis leftward, interval within normal limits.  No acute ST-wave changes.   ASSESSMENT AND PLAN:  1. Coronary artery disease.  The patient has coronary artery disease      with  no ongoing symptoms.  No further cardiovascular testing is      suggested.  He will continue with medications as listed.  Of  note,      he does have some issues with guaiac-positive stools, apparently he      has not had any profound anemia.  He is due to see Dr. Marina Colon soon.      He has had his aspirin held in the past with radiation proctitis.      For now, he is going to continue his aspirin and Plavix and should      only have this stopped, if there is absolute contraindication.  He      will continue with risk reduction and increase his walking even      more.  2. Dyslipidemia.  I reviewed his lipid profile.  He is very happy to      see that his HDL is up 10 points from 34-44, his LDL is in the 80s.      He will continue on this regimen.  3. Hypertension.  Blood pressure is borderline.  He will keep an eye      on this.  He will continue on the  medication as listed.  4. Followup.  We will see him back in 6 months or sooner if needed.     Rollene Rotunda, MD, [facility]  Electronically Signed    JH/MedQ  DD: 08/05/2008  DT: 08/06/2008  Job #: [id number]   cc:   [name] Colon, D.O.

## 2011-04-09 NOTE — Assessment & Plan Note (Signed)
[facility]                            [place] OFFICE NOTE   NAME:[name] Colon, [name] Colon                      MRN:          [id number]  DATE:05/13/2008                            DOB:          09-30-1941    PRIMARY CARE PHYSICIAN:  Lovenia Kim, DO.   REASON FOR PRESENTATION:  Evaluate the patient with coronary artery  disease status post recent PCI.   HISTORY OF PRESENT ILLNESS:  The patient presented with chest discomfort  and had an abnormal stress perfusion study at [facility].  He had a  cardiac catheterization subsequently demonstrating an EF of 60%.  Left  main had 25% stenosis, LAD had a long proximal 25% stenosis, first  diagonal was large and wrapped the apex of the long proximal 30%-40%  stenosis, the circumflex in the AV groove had diffuse luminal  irregularities with 20% stenosis, there is distal long 99% stenosis  before moderate size posterolateral, first obtuse marginal was very  small and normal, second obtuse marginal was large and branching with  diffuse luminal irregularities, and the right coronary artery is  dominant but moderate sized with diffuse scattered 80%-90% stenosis and  mid 99% stenosis.  The patient subsequently had stenting with a drug-  eluting stent of the left circumflex.   Since discharge from the hospital, he has not been overly active but has  not had any symptoms.  He has not had any chest discomfort, neck or arm  discomfort.  He has had no palpitation, presyncope, or syncope.  He has  had no shortness of breath.  He denies any PND or orthopnea.  He has had  no groin pain or bleeding.   PAST MEDICAL HISTORY:  1. Coronary artery disease, as described.  2. Hypertension.  3. Dyslipidemia.  4. Diet-controlled diabetes.  5. Prostate cancer.  6. Posttraumatic stress disorder.  7. Lumbar disk disease.   PAST SURGICAL HISTORY:  Radiation seed implants and operation 4 pulled  muscles.   ALLERGIES:  None.   MEDICATIONS:  1. Norvasc 10 mg daily.  2. Hydrochlorothiazide 25 mg daily.  3. Paroxetine 50 mg daily.  4. Risperdal 0.5 mg at bedtime.  5. Simvastatin 80 mg daily.  6. Flomax 0.4 mg daily.  7. Ambien 5 mg daily.  8. Citalopram.  9. Plavix 75 mg daily.  10.Aspirin 325 mg daily.  11.Vitamin D.   REVIEW OF SYSTEMS:  As stated in the HPI and otherwise negative for  other systems.   PHYSICAL EXAMINATION:  GENERAL:  The patient is in no distress.  VITAL SIGNS:  Blood pressure 118/68, heart rate 58 and regular, weight  191, and body mass index 28.  HEENT:  Eyelids are unremarkable, pupils equal, round, and reactive to  light, fundi not visualized, and oral mucosa is unremarkable.  NECK:  No  jugular venous distention at 45 degrees, carotid upstroke brisk and  symmetrical, no bruits, and no thyromegaly.  LYMPHATICS:  No cervical, axillary, or inguinal adenopathy.  LUNGS:  Clear to auscultation bilaterally.  CHEST:  Unremarkable.  HEART:  PMI not displaced or sustained, S1 and S2 within  normal limits,  no S3 or S4, and no clicks, rubs, or murmurs.  ABDOMEN:  Flat, positive  bowel sounds, normal in frequency and pitch, no bruits, no rebound, no  guarding, no midline pulsatile mass, no hepatomegaly, and no  splenomegaly.  SKIN:  No rashes and no nodules.  EXTREMITIES:  Pulses 2+ throughout, the right groin has a slight firm,  nontender nodule on to the skin without bruit or pulsatile mass.  NEUROLOGIC:  Grossly intact.   EKG; sinus rhythm, rate 58, axis within normal limits, intervals within  normal limits, and no acute ST-T wave changes.   ASSESSMENT AND PLAN:  1. Coronary artery disease.  The patient is having no further      symptoms.  No further cardiovascular testing is suggested.  He will      continue with aggressive risk reduction.  2. Dyslipidemia.  We will check a lipid profile again in about 12      weeks.  He has had a low HDL, and I have prescribed exercise which       he is going to slowly increase.  He may need combination therapy      going for.  3. Borderline blood sugars.  He reports a hemoglobin A1c of 6.  He      will have this followed by Dr. Elisabeth Most going forward.  4. Erectile dysfunction.  I cautioned the patient about the      combination of Flomax and Viagra.  He has no contraindication to      become sexually active again, though I have not prescribed      the Viagra.  He is not taking nitrates.  5. Followup.  We will see him back in 3 months.     Rollene Rotunda, MD, [facility]  Electronically Signed    JH/MedQ  DD: 05/13/2008  DT: 05/14/2008  Job #: [id number]   cc:   Lovenia Kim, D.O.

## 2011-04-09 NOTE — Discharge Summary (Signed)
[name] Colon, [name] Colon             ACCOUNT NO.:  [id number]   MEDICAL RECORD NO.:  [id number]          PATIENT TYPE:  OIB   LOCATION:  1961                         FACILITY:  [facility]   PHYSICIAN:  Veverly Fells. Excell Seltzer, MD  DATE OF BIRTH:  10-Dec-1940   DATE OF ADMISSION:  05/02/2008  DATE OF DISCHARGE:  05/03/2008                               DISCHARGE SUMMARY   PRIMARY CARDIOLOGIST:  Rollene Rotunda, MD, [facility].   PRIMARY CARE Tkeya Stencil:  Lovenia Kim, D.O.   [facility] Prathik Aman:  Dr. Olene Floss.   DISCHARGE DIAGNOSIS:  Unstable angina.   SECONDARY DIAGNOSES:  1. Coronary artery disease status post successful percutaneous      coronary intervention and stenting of the left circumflex with the      platinum studies, drug-eluting stent, this admission.  2. Hypertension.  3. Hyperlipidemia.  4. Diet-controlled diabetes mellitus.  5. History of prostate cancer.  6. History of posttraumatic stress disorder.  7. Lumbar disk disease.  8. Nephrolithiasis.   ALLERGIES:  No known drug allergies.   PROCEDURE:  Left cardiac catheterization with successful PCI and  stenting of the right coronary artery with placement of drug-eluting  stent.   HISTORY OF PRESENT ILLNESS:  A 70 year old African-American male without  prior cardiac history, who recently had episode of chest discomfort and  underwent  Myoview stress testing demonstrating moderate inferior defect  with moderate redistribution suggestive of ischemia.  EF was 66%.  He  subsequently follow up Dr. Antoine Poche, April 26, 2008, in our [place]  office, now scheduled for cardiac catheterization.   HOSPITAL COURSE:  The patient presented to the outpatient cardiac cath  lab, May 02, 2008, with a left heart cardiac catheterization revealed  99% stenosis in the mid-right coronary artery with 80% to 90% diffuse  stenosis more proximally.  There is also a 99% stenosis in the distal  left circumflex.  EF was 60% with mild inferior hypokinesis.  Films  were  reviewed and was felt the patient benefit most from percutaneous  intervention to the left circumflex.  He then underwent successful PCI  and stenting of the left circumflex with placement of a Tree surgeon Platinum Study drug-eluting stent.  The patient tolerated  this procedure well and post procedure cardiac markers have remained  negative.  He has been ambulating without difficulty or limitations, and  we discharged home today in good condition.   DISCHARGE LABS:  Hemoglobin 13.3, hematocrit 40.0, WBC 4.6, platelets  158, MCV 79.4, sodium 138, potassium 3.1, chloride 104, CO2 27, BUN 11,  creatinine 28.7, glucose 84, CK 235, MB 3.1, and troponin I 0.01.  Total  cholesterol 126, triglycerides 53, HDL 34, LDL 81, calcium 8.6, and  hemoglobin A1c 6.1.   DISPOSITION:  The patient is being discharged home today in good  condition.   FOLLOW-UP APPOINTMENTS:  We have arranged for follow up with Dr.  Antoine Poche in our [facility] office on May 13, 2008, at 10:30 a.m.  He is  asked to follow up Dr. Marisue Brooklyn and Dr. Olene Floss at the [facility] as previously scheduled.  DISCHARGE MEDICATIONS:  Aspirin 325 mg daily, Plavix 75 mg daily,  Lexapro 40 mg  daily, Norvasc 10 mg daily, HCTZ 25 mg daily, Risperdal  0.5 mg nightly, and simvastatin 80 mg nightly, Flomax 0.4 mg nightly,  Ambien 5 mg nightly, vitamin B6 50 mg daily, nitroglycerin 0.4 mg  sublingual p.r.n. chest pain.  Outstanding lab studies, none.   DURATION OF DISCHARGE/ENCOUNTER:  45 minutes including physician time.      [name] Colon, [name] Colon      Veverly Fells. Excell Seltzer, MD  Electronically Signed    CB/MEDQ  D:  05/03/2008  T:  05/04/2008  Job:  [id number]   cc:   Lovenia Kim, D.O.  Dr. Olene Floss

## 2011-04-20 ENCOUNTER — Other Ambulatory Visit: Payer: Self-pay | Admitting: Cardiovascular Disease

## 2011-06-03 ENCOUNTER — Encounter: Payer: Self-pay | Admitting: Cardiology

## 2011-07-09 ENCOUNTER — Encounter: Payer: Self-pay | Admitting: Cardiology

## 2011-07-09 ENCOUNTER — Ambulatory Visit (INDEPENDENT_AMBULATORY_CARE_PROVIDER_SITE_OTHER): Payer: Medicare Other | Admitting: Cardiology

## 2011-07-09 DIAGNOSIS — I2510 Atherosclerotic heart disease of native coronary artery without angina pectoris: Secondary | ICD-10-CM

## 2011-07-09 DIAGNOSIS — E785 Hyperlipidemia, unspecified: Secondary | ICD-10-CM

## 2011-07-09 DIAGNOSIS — R609 Edema, unspecified: Secondary | ICD-10-CM

## 2011-07-09 DIAGNOSIS — I10 Essential (primary) hypertension: Secondary | ICD-10-CM

## 2011-07-09 NOTE — Patient Instructions (Signed)
The current medical regimen is effective;  continue present plan and medications.  Your physician has requested that you have an exercise tolerance test. For further information please visit [email or web address] Please also follow instruction sheet, as given.  Please have fasting lab (lipid) work the day you return for your treadmill.  Please speak with your MD at the [facility] about the possibility of changing from Zocor as you are also on Norvasc (Amlodipine).

## 2011-07-09 NOTE — Assessment & Plan Note (Signed)
This is mild and he he will have no change in therapy.

## 2011-07-09 NOTE — Assessment & Plan Note (Signed)
I will bring the patient back for a POET (Plain Old Exercise Test). This will allow me to screen for obstructive coronary disease, risk stratify and very importantly provide a prescription for exercise.  It has been 4 years since his cath.  He needs to exercise more.

## 2011-07-09 NOTE — Assessment & Plan Note (Signed)
The blood pressure is at target. No change in medications is indicated. We will continue with therapeutic lifestyle changes (TLC).  

## 2011-07-09 NOTE — Assessment & Plan Note (Signed)
He will get a fasting lipid profile when he returns.  I told him that he should be off of Zocor 80 mg since he is on amlodipine 10.  [facility] and does not want me to write a prescription.  He understands the FDA warning.

## 2011-07-09 NOTE — Progress Notes (Signed)
HPI The patient presents for follow up of his CAD.  Since I last saw him he has done well.  The patient denies any new symptoms such as chest discomfort, neck or arm discomfort. There has been no new shortness of breath, PND or orthopnea. There have been no reported palpitations, presyncope or syncope.  He does not exercise as much as I would like.  He goes up and down stairs without complaints.  No Known Allergies  Current Outpatient Prescriptions  Medication Sig Dispense Refill  . amLODipine (NORVASC) 10 MG tablet Take 10 mg by mouth daily.        Marland Kitchen aspirin EC 325 MG tablet Take 325 mg by mouth daily.        . citalopram (CELEXA) 40 MG tablet Take 20 mg by mouth daily.        . hydrochlorothiazide 25 MG tablet Take 25 mg by mouth daily.        . nitroGLYCERIN (NITROSTAT) 0.4 MG SL tablet Place 0.4 mg under the tongue every 5 (five) minutes as needed.        Marland Kitchen PLAVIX 75 MG tablet TAKE 1 TABLET BY MOUTH DAILY  30 tablet  11  . pyridOXINE (VITAMIN B-6) 50 MG tablet Take 50 mg by mouth daily.        . risperiDONE (RISPERDAL) 2 MG tablet Take 1 mg by mouth daily.        . simvastatin (ZOCOR) 80 MG tablet Take 40 mg by mouth at bedtime.        . tadalafil (CIALIS) 10 MG tablet Take 10 mg by mouth daily as needed.        . Tamsulosin HCl (FLOMAX) 0.4 MG CAPS Take 0.4 mg by mouth daily.        Marland Kitchen zolpidem (AMBIEN) 10 MG tablet Take 10 mg by mouth at bedtime.          Past Medical History  Diagnosis Date  . History of colonoscopy   . History of colonic polyps   . Diverticulitis, colon   . CAD (coronary artery disease)   . Adenocarcinoma of prostate     s/p seed implantation  . Hyperlipidemia   . HTN (hypertension)   . Radiation proctitis     with chronic bleeding    Past Surgical History  Procedure Date  . Coronary angioplasty with stent placement 04/2008   ROS:  As stated in the HPI and negative for all other systems.  PHYSICAL EXAM BP 126/68  Pulse 58  Ht 5\' 9"  (1.753 m)  Wt 189  lb (85.73 kg)  BMI 27.91 kg/m2 GENERAL:  Well appearing HEENT:  Pupils equal round and reactive, fundi not visualized, oral mucosa unremarkable NECK:  No jugular venous distention, waveform within normal limits, carotid upstroke brisk and symmetric, no bruits, no thyromegaly LYMPHATICS:  No cervical, inguinal adenopathy LUNGS:  Clear to auscultation bilaterally BACK:  No CVA tenderness CHEST:  Unremarkable HEART:  PMI not displaced or sustained,S1 and S2 within normal limits, no S3, no S4, no clicks, no rubs, no murmurs ABD:  Flat, positive bowel sounds normal in frequency in pitch, no bruits, no rebound, no guarding, no midline pulsatile mass, no hepatomegaly, no splenomegaly EXT:  2 plus pulses throughout, trace edema, no cyanosis no clubbing SKIN:  No rashes no nodules NEURO:  Cranial nerves II through XII grossly intact, motor grossly intact throughout PSYCH:  Cognitively intact, oriented to person place and time   EKG:  Sinus rhythm, rate  58, axis within normal limits, intervals within normal limits, no acute ST-T wave changes.   ASSESSMENT AND PLAN

## 2011-07-27 ENCOUNTER — Encounter: Payer: Self-pay | Admitting: Cardiology

## 2011-08-05 ENCOUNTER — Ambulatory Visit (INDEPENDENT_AMBULATORY_CARE_PROVIDER_SITE_OTHER): Payer: Medicare Other | Admitting: General Surgery

## 2011-08-05 ENCOUNTER — Encounter (INDEPENDENT_AMBULATORY_CARE_PROVIDER_SITE_OTHER): Payer: Self-pay | Admitting: General Surgery

## 2011-08-05 VITALS — BP 124/84 | HR 56 | Temp 98.2°F | Ht 69.0 in | Wt 187.6 lb

## 2011-08-05 DIAGNOSIS — R229 Localized swelling, mass and lump, unspecified: Secondary | ICD-10-CM

## 2011-08-05 DIAGNOSIS — R2230 Localized swelling, mass and lump, unspecified upper limb: Secondary | ICD-10-CM

## 2011-08-05 NOTE — Progress Notes (Signed)
Chief Complaint  Patient presents with  . Other    new pt- eval  4-5 cm. cystic mass left axilla...Marland KitchenMarland Kitchen pt on antibiotics    HPI [name] Colon is a 70 y.o. male.    The stoma was referred to me by Dr. Lucky Cowboy for evaluation of an enlarging mass in the left axilla.  This patient states that he has had a lump in his left axilla for many years. It has been enlarging for the past 6 months and this has accelerated over the past few weeks. He denies any history of pain, tenderness or drainage.  He was recently placed on doxycycline for this.  Significant medical history is that he has coronary artery disease and had stent placed in June 2009. He is on Plavix and aspirin. He has been treated with for prostate cancer with radiation therapy. He has hypertension and radiation proctitis. HPI  Past Medical History  Diagnosis Date  . History of colonoscopy   . History of colonic polyps   . Diverticulitis, colon   . CAD (coronary artery disease)   . Adenocarcinoma of prostate     s/p seed implantation  . Hyperlipidemia   . HTN (hypertension)   . Radiation proctitis     with chronic bleeding  . Axillary abscess     left    Past Surgical History  Procedure Date  . Coronary angioplasty with stent placement 04/2008    Family History  Problem Relation Age of Onset  . Heart disease Father   . Colon cancer Neg Hx     Social History History  Substance Use Topics  . Smoking status: Former Smoker    Quit date: 11/22/1992  . Smokeless tobacco: Not on file  . Alcohol Use: Yes    No Known Allergies  Current Outpatient Prescriptions  Medication Sig Dispense Refill  . amLODipine (NORVASC) 10 MG tablet Take 10 mg by mouth daily.        Marland Kitchen aspirin EC 325 MG tablet Take 325 mg by mouth daily.        . citalopram (CELEXA) 40 MG tablet Take 20 mg by mouth daily.        Marland Kitchen doxycycline (ADOXA) 100 MG tablet Take 100 mg by mouth 2 (two) times daily.        . hydrochlorothiazide 25 MG  tablet Take 25 mg by mouth daily.        . nitroGLYCERIN (NITROSTAT) 0.4 MG SL tablet Place 0.4 mg under the tongue every 5 (five) minutes as needed.        Marland Kitchen PLAVIX 75 MG tablet TAKE 1 TABLET BY MOUTH DAILY  30 tablet  11  . propranolol (INDERAL) 10 MG tablet Take 10 mg by mouth as needed.        . pyridOXINE (VITAMIN B-6) 50 MG tablet Take 50 mg by mouth daily.        . risperiDONE (RISPERDAL) 2 MG tablet Take 1 mg by mouth daily.        . simvastatin (ZOCOR) 80 MG tablet Take 40 mg by mouth at bedtime.        . tadalafil (CIALIS) 10 MG tablet Take 10 mg by mouth daily as needed.        . Tamsulosin HCl (FLOMAX) 0.4 MG CAPS Take 0.4 mg by mouth daily.        Marland Kitchen zolpidem (AMBIEN) 10 MG tablet Take 10 mg by mouth at bedtime.  Review of Systems Review of Systems  Constitutional: Negative.   HENT: Negative.   Eyes: Negative.   Respiratory: Negative.   Cardiovascular: Negative.   Gastrointestinal: Negative.   Genitourinary: Negative.   Musculoskeletal: Negative.   Skin: Negative.   Neurological: Negative.   Hematological: Negative.   Psychiatric/Behavioral: Negative.     Blood pressure 124/84, pulse 56, temperature 98.2 F (36.8 C), temperature source Temporal, height 5\' 9"  (1.753 m), weight 187 lb 9.6 oz (85.095 kg).  Physical Exam Physical Exam  Constitutional: He appears well-developed. No distress.  HENT:  Head: Normocephalic and atraumatic.  Nose: Nose normal.  Mouth/Throat: No oropharyngeal exudate.  Eyes: Conjunctivae are normal. Left eye exhibits no discharge. No scleral icterus.  Neck: Normal range of motion. Neck supple. No JVD present. No tracheal deviation present. No thyromegaly present.  Cardiovascular: Normal rate, regular rhythm and normal heart sounds.  Exam reveals no gallop.   No murmur heard. Pulmonary/Chest: Effort normal and breath sounds normal. He has no wheezes. He has no rales. He exhibits no tenderness.  Abdominal: Bowel sounds are normal. He  exhibits no distension and no mass. There is no tenderness. There is no rebound and no guarding.  Musculoskeletal: Normal range of motion. He exhibits no edema.  Lymphadenopathy:    He has no cervical adenopathy.  Neurological: He is alert. He exhibits normal muscle tone.  Skin: Skin is dry. No rash noted. He is not diaphoretic. No erythema. No pallor.     Psychiatric: His behavior is normal. Judgment and thought content normal.    Data Reviewed  I reviewed Dr. Gigi Gin office notes, his medication list, and his medical history. Assessment    5 cm left axillary mass, probable epidermoid cyst. This has been enlarging and is at risk for infection.  Coronary artery disease, status post coronary angioplasty with stent placement 2009.  Prostate cancer.  Radiation proctitis  Hypertension  Hyperlipidemia  Diverticulosis.     Plan    Scheduled for elective excision of this large mass in his left axilla under monitored sedation in a controlled setting in the operating room.  He will need to discontinue his aspirin and Plavix for 5 days preop to reduce the risk of bleeding.   cardiac clearance with Dr. Antoine Poche.  I discussed the indications and details of the surgery with the patient. Risks and complications have been outlined, including but not limited to bleeding, infection, nerve damage with chronic pain or numbness, cardiac pulmonary and thromboembolic problems. Recurrence is also a possibility and this was explained. He seems to understand all these issues well. All ofl his questions were answered. He is in full agreement with this plan.       Caeli Linehan M 08/05/2011, 9:35 AM

## 2011-08-05 NOTE — Patient Instructions (Signed)
You have a 5 cm mass in your left axilla. I suspect that this is a benign cystic process that has enlarged recently. There may be some low-grade infection associated with this, and I agree with the antibiotic you are taking. We are going to schedule you for surgery to remove this in the near future.

## 2011-08-09 ENCOUNTER — Ambulatory Visit (INDEPENDENT_AMBULATORY_CARE_PROVIDER_SITE_OTHER): Payer: Medicare Other | Admitting: General Surgery

## 2011-08-09 ENCOUNTER — Encounter (INDEPENDENT_AMBULATORY_CARE_PROVIDER_SITE_OTHER): Payer: Self-pay | Admitting: General Surgery

## 2011-08-09 VITALS — Temp 98.2°F

## 2011-08-09 DIAGNOSIS — L089 Local infection of the skin and subcutaneous tissue, unspecified: Secondary | ICD-10-CM

## 2011-08-09 DIAGNOSIS — L723 Sebaceous cyst: Secondary | ICD-10-CM

## 2011-08-09 NOTE — Progress Notes (Signed)
Subjective:     Patient ID: [name] Colon, male   DOB: 02/20/1941, 70 y.o.   MRN: [id number]  HPI This is a 70 year old male with a history of a left axillary abscess who was seen by Dr. Derrell Lolling last week. He was awaiting clearance from his cardiologist prior to taking him to the operating room. In the interim he has developed more tenderness and drainage from his left axillary abscess. He denies any fevers at this point. He still is on his Plavix and his aspirin.  Review of Systems     Objective:   Physical Exam tender fluctuant left axillary abscess with some surrounding erythema   Assessment:     Left axillary abscess    Plan:     His left axillary abscess is certainly worsen when he was here previously. I discussed with him incising and draining this today. I cleansed this with Betadine and then anesthetized with 1% lidocaine. I then made a cruciate incision overlying this with the turn of some. Once as well as a fair amount of sebaceous cyst material. I then packed this with iodoform gauze and placed a dressing. He still is going to need this excised at some point but they should take care of the infection. He's going to continue the doxycycline. He is going to come back and see Dr. Derrell Lolling in one week. I had him remove the packing in 48 hours.

## 2011-08-16 ENCOUNTER — Encounter (INDEPENDENT_AMBULATORY_CARE_PROVIDER_SITE_OTHER): Payer: Self-pay | Admitting: General Surgery

## 2011-08-16 ENCOUNTER — Ambulatory Visit (INDEPENDENT_AMBULATORY_CARE_PROVIDER_SITE_OTHER): Payer: Medicare Other | Admitting: General Surgery

## 2011-08-16 VITALS — BP 126/84 | HR 60 | Temp 96.6°F | Resp 14 | Ht 69.0 in | Wt 192.2 lb

## 2011-08-16 DIAGNOSIS — Z9889 Other specified postprocedural states: Secondary | ICD-10-CM

## 2011-08-16 NOTE — Progress Notes (Signed)
Subjective:     Patient ID: [name] Colon, male   DOB: 10/29/1941, 70 y.o.   MRN: [id number]  HPI This patient underwent recent incision and drainage of left axillary abscess by Dr. Dwain Sarna. Dr. Dwain Sarna also expressed some sebaceous material. The patient states that he's feeling much better. He is still taking antibiotics.  Review of Systems     Objective:   Physical Exam The patient looks well. He is in no distress.  The left axilla shows a small mass and a small open area where the incision was made. The lump is probably 2 cm x 1 cm in size. It is much smaller than it was before. It is not tender. There is no purulent drainage or cellulitis.    Assessment:     Abscessed left axillary sebaceous cyst. Recovering uneventfully following incision and drainage.    Plan:     Continue wound care and antibiotics as prescribed.  I told him that if a mass persists, more than one month, that he needs to return to see me for possible excision of residual sebaceous cyst. He was told that if the mass completely resolves then nothing further needs to be done.

## 2011-08-16 NOTE — Patient Instructions (Signed)
The nfection in your left axilla appears to be clearing up. You still have a small lump which hopefully will completely resolve. Take the rest of the antibiotics until they are completely gone. If you still have a lump in your left axilla in one month then return to see me. If the lump completely goes away, then nothing further needs to be done.

## 2011-08-19 LAB — CARDIAC PANEL(CRET KIN+CKTOT+MB+TROPI)
CK, MB: 3.1
CK, MB: 3.4
Relative Index: 1.1
Relative Index: 1.3
Total CK: 235 — ABNORMAL HIGH
Total CK: 310 — ABNORMAL HIGH
Troponin I: 0.01
Troponin I: 0.01

## 2011-08-19 LAB — BASIC METABOLIC PANEL
BUN: 11
CO2: 27
Calcium: 8.6
Chloride: 104
Creatinine, Ser: 0.87
GFR calc Af Amer: >60
GFR calc non Af Amer: >60
Glucose, Bld: 84
Potassium: 3.1 — ABNORMAL LOW
Sodium: 138

## 2011-08-19 LAB — POCT I-STAT GLUCOSE
Glucose, Bld: 88
Operator id: 141321

## 2011-08-19 LAB — CBC
HCT: 40
Hemoglobin: 13.3
MCHC: 33.3
MCV: 79.4
Platelets: 158
RBC: 5.04
RDW: 14.6
WBC: 4.6

## 2011-08-19 LAB — HEMOGLOBIN A1C
Hgb A1c MFr Bld: 6.1
Mean Plasma Glucose: 140

## 2011-08-19 LAB — LIPID PANEL
Cholesterol: 126
HDL: 34 — ABNORMAL LOW
LDL Cholesterol: 81
Total CHOL/HDL Ratio: 3.7
Triglycerides: 53
VLDL: 11

## 2011-08-30 ENCOUNTER — Ambulatory Visit (INDEPENDENT_AMBULATORY_CARE_PROVIDER_SITE_OTHER): Payer: Medicare Other | Admitting: Cardiology

## 2011-08-30 ENCOUNTER — Other Ambulatory Visit (INDEPENDENT_AMBULATORY_CARE_PROVIDER_SITE_OTHER): Payer: Medicare Other | Admitting: *Deleted

## 2011-08-30 DIAGNOSIS — I2510 Atherosclerotic heart disease of native coronary artery without angina pectoris: Secondary | ICD-10-CM

## 2011-08-30 DIAGNOSIS — E785 Hyperlipidemia, unspecified: Secondary | ICD-10-CM

## 2011-08-30 LAB — LIPID PANEL
Cholesterol: 136 mg/dL (ref 0–200)
HDL: 46.7 mg/dL (ref >39.00)
LDL Cholesterol: 76 mg/dL (ref 0–99)
Total CHOL/HDL Ratio: 3
Triglycerides: 67 mg/dL (ref 0.0–149.0)
VLDL: 13.4 mg/dL (ref 0.0–40.0)

## 2011-08-30 NOTE — Progress Notes (Signed)
Exercise Treadmill Test  Pre-Exercise Testing Evaluation Rhythm: sinus bradycardia  Rate: 54   PR:  17 QRS:  .09  QT:  46 QTc: 44     Test  Exercise Tolerance Test Ordering MD: Angelina Sheriff, MD  Interpreting MD:  Angelina Sheriff, MD  Unique Test No: 1  Treadmill:  1  Indication for ETT: known ASHD  Contraindication to ETT: No   Stress Modality: exercise - treadmill  Cardiac Imaging Performed: non   Protocol: standard Bruce - maximal  Max BP:  179/93  Max MPHR (bpm):  150 85% MPR (bpm):  127  MPHR obtained (bpm):  100 % MPHR obtained:  70  Reached 85% MPHR (min:sec):  NA Total Exercise Time (min-sec):  6:00  Workload in METS:  7.0 Borg Scale: 17  Reason ETT Terminated:  fatigue    ST Segment Analysis At Rest: normal ST segments - no evidence of significant ST depression With Exercise: no evidence of significant ST depression  Other Information Arrhythmia:  No Angina during ETT:  absent (0) Quality of ETT:  non-diagnostic  ETT Interpretation:  normal - no evidence of ischemia by ST analysis  Comments: The patient had a fair exercise tolerance.  There was no chest pain.  There was an appropriate level of dyspnea.  There were no arrhythmias, a normal heart rate response and normal BP response.  There were no ischemic ST T wave changes and a normal heart rate recovery.  He did not achieve his target heart rate because of his medications.  I did not want him to hold his medications for this.   Recommendations: Negative ETT but not at target heart rate.  No further testing is indicated.  Based on the above I gave the patient a prescription for exercise.

## 2011-09-07 ENCOUNTER — Encounter: Payer: Self-pay | Admitting: *Deleted

## 2012-02-29 ENCOUNTER — Encounter: Payer: Self-pay | Admitting: Cardiology

## 2012-04-18 ENCOUNTER — Other Ambulatory Visit: Payer: Self-pay | Admitting: *Deleted

## 2012-04-18 MED ORDER — CLOPIDOGREL BISULFATE 75 MG PO TABS: 75.0000 mg | ORAL_TABLET | Freq: Every day | ORAL | Status: DC

## 2012-05-31 ENCOUNTER — Other Ambulatory Visit (HOSPITAL_COMMUNITY): Payer: Self-pay | Admitting: Internal Medicine

## 2012-05-31 ENCOUNTER — Ambulatory Visit (HOSPITAL_COMMUNITY)
Admission: RE | Admit: 2012-05-31 | Discharge: 2012-05-31 | Disposition: A | Discharge status: Home / Self Care | Payer: Medicare Other | Source: Ambulatory Visit | Attending: Internal Medicine | Admitting: Internal Medicine

## 2012-05-31 DIAGNOSIS — R059 Cough, unspecified: Secondary | ICD-10-CM | POA: Insufficient documentation

## 2012-05-31 DIAGNOSIS — R634 Abnormal weight loss: Secondary | ICD-10-CM | POA: Insufficient documentation

## 2012-05-31 DIAGNOSIS — R05 Cough: Secondary | ICD-10-CM

## 2012-05-31 DIAGNOSIS — Z87891 Personal history of nicotine dependence: Secondary | ICD-10-CM | POA: Insufficient documentation

## 2012-05-31 IMAGING — CR DG CHEST 2V
3 series · 3 of 3 positions shown · non-contrast
Comparison: None.

CLINICAL DATA: Nonproductive cough.  Former smoker. Unintentional
weight loss during past month.

CHEST - 2 VIEW

[view not recorded (1 of 3)]
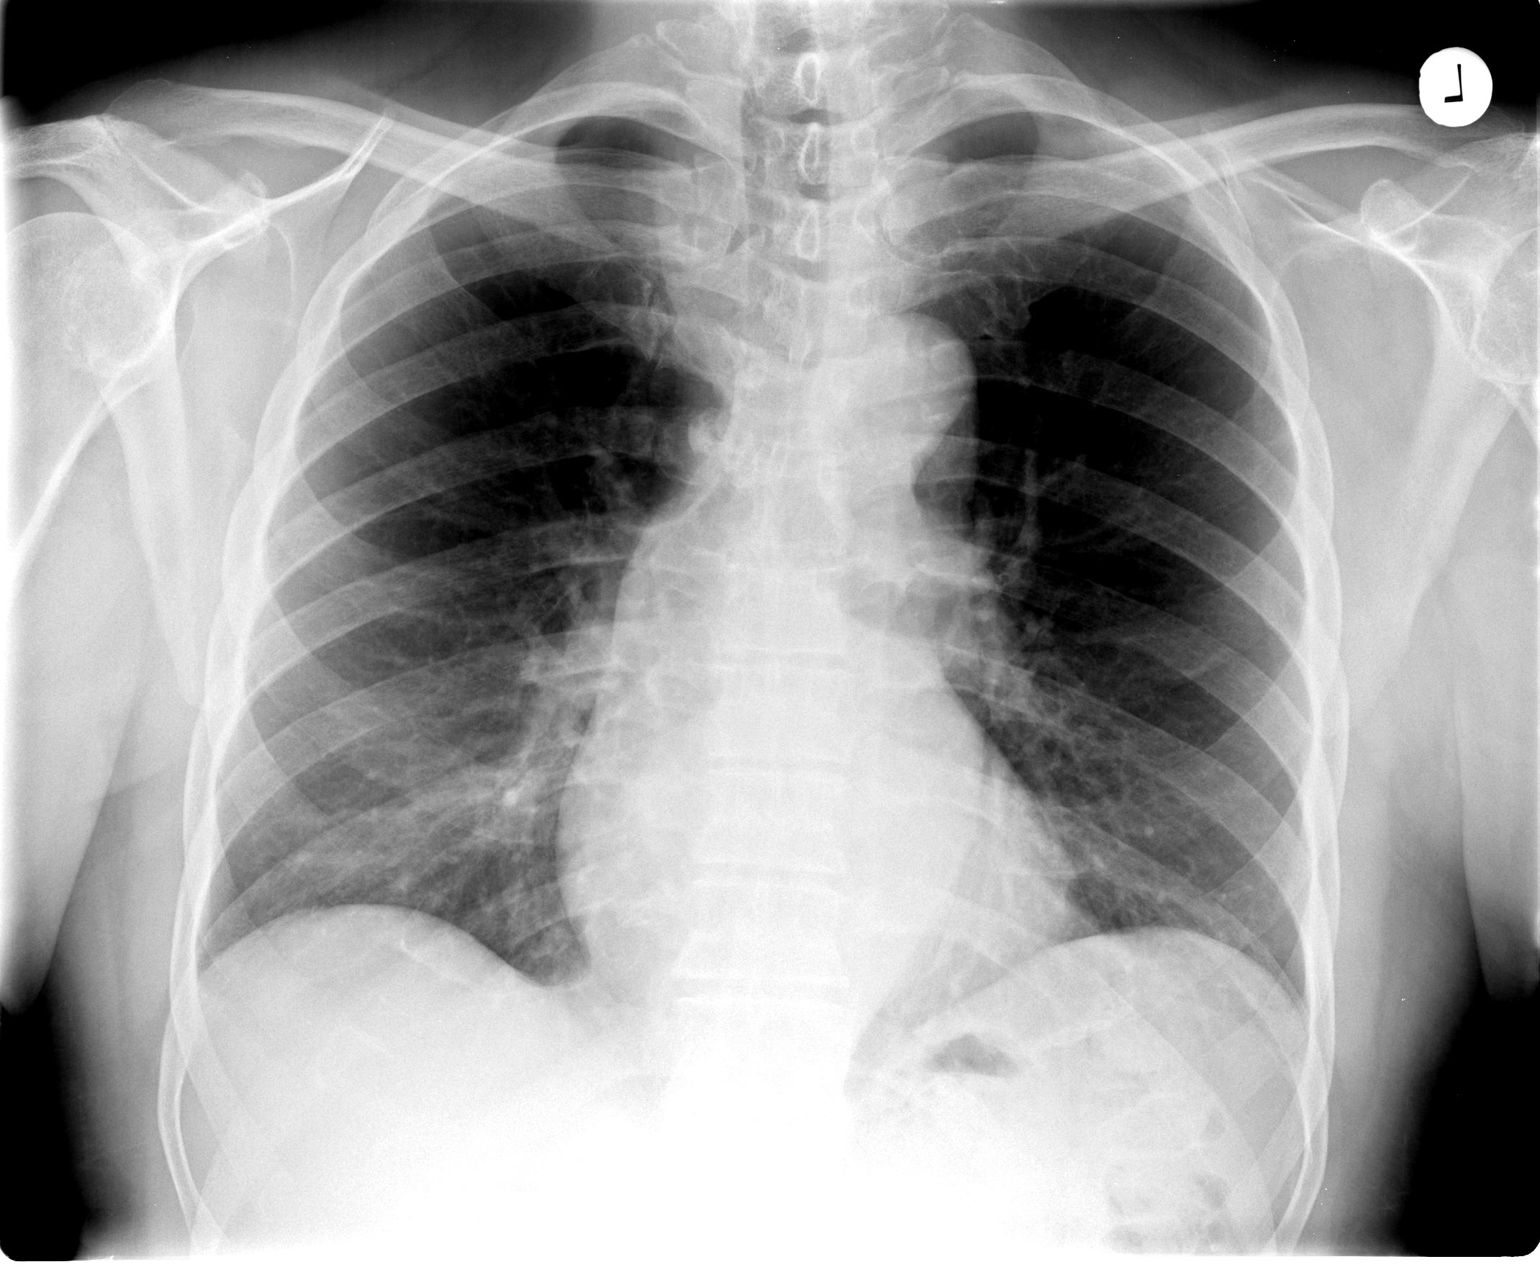

[view not recorded (2 of 3)]
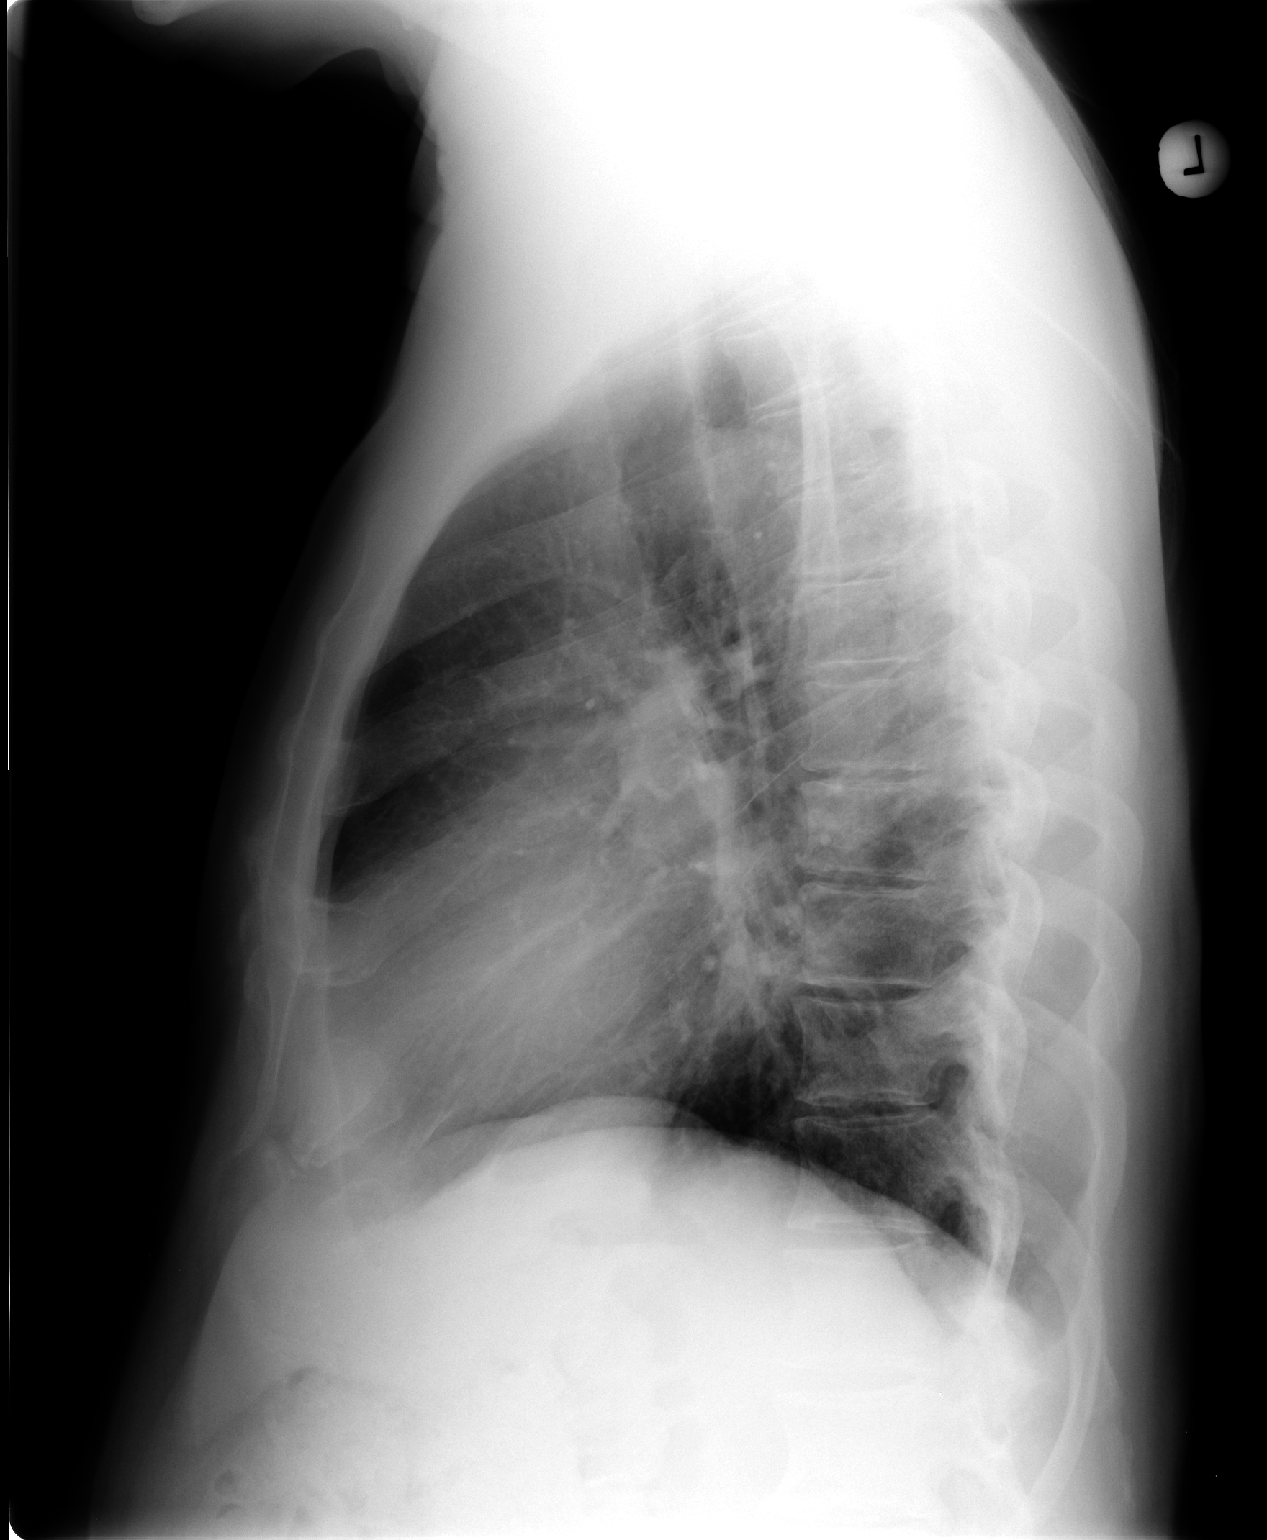

[view not recorded (3 of 3)]
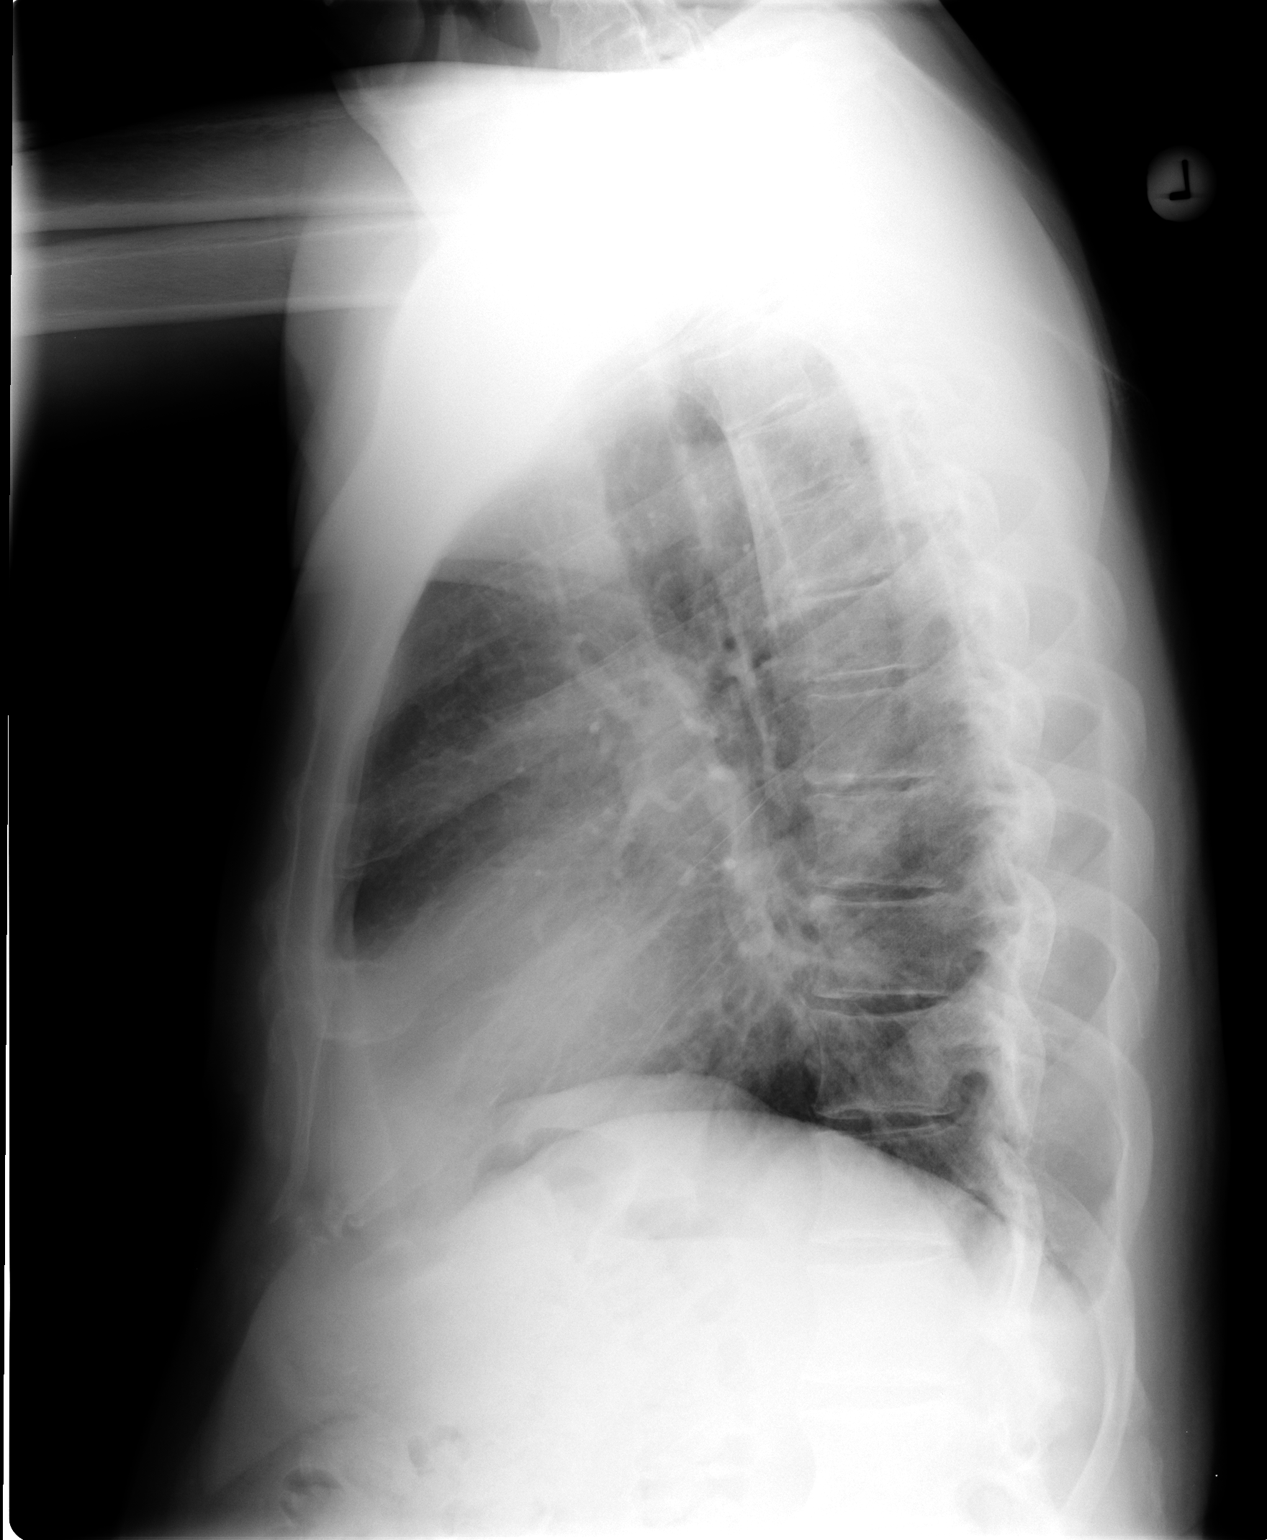

[3 of 3 positions shown; findings below may reference images not displayed]

FINDINGS: The heart size is within normal limits.  Mild tortuosity
of the thoracic aorta and great vessels is noted.  Both lungs are
clear.  No evidence of pleural effusion.  No mass or
lymphadenopathy identified.  The visualized skeletal structures are
unremarkable.
IMPRESSION: No active cardiopulmonary disease.

## 2012-08-16 ENCOUNTER — Encounter (HOSPITAL_COMMUNITY): Payer: Self-pay | Admitting: Adult Health

## 2012-08-16 DIAGNOSIS — R51 Headache: Secondary | ICD-10-CM | POA: Insufficient documentation

## 2012-08-16 DIAGNOSIS — I10 Essential (primary) hypertension: Secondary | ICD-10-CM | POA: Insufficient documentation

## 2012-08-16 NOTE — ED Notes (Addendum)
Went to [place] today for an appointment for PSTD, found to have BP of 204/108. Sent to [facility] ER and given Hydralazine 50 mg and norvasc 5 mg and sent home with same. Took one hydralazine at 4 pm for increased BP of 190/100, bp was brought down, but pt developed HA this evening and bp has gone back up. Denies blurred vision, denies CP, denies SOB. Neurologically intact.  BP 187/109 at this time.  Pt has been taking Lisinopril 40 mg for HTN.

## 2012-08-17 ENCOUNTER — Emergency Department (HOSPITAL_COMMUNITY): Payer: Medicare Other

## 2012-08-17 ENCOUNTER — Emergency Department (HOSPITAL_COMMUNITY)
Admission: EM | Admit: 2012-08-17 | Discharge: 2012-08-17 | Disposition: A | Discharge status: Home / Self Care | Payer: Medicare Other | Attending: Emergency Medicine | Admitting: Emergency Medicine

## 2012-08-17 DIAGNOSIS — R51 Headache: Secondary | ICD-10-CM

## 2012-08-17 DIAGNOSIS — I10 Essential (primary) hypertension: Secondary | ICD-10-CM

## 2012-08-17 IMAGING — CT CT HEAD W/O CM
1 of 2 series · 13 of 30 positions shown, 17 images · non-contrast
Comparison: None.

CLINICAL DATA: Hypertension.  Headaches.  Nausea.

CT HEAD WITHOUT CONTRAST
TECHNIQUE: Contiguous axial images were obtained from the base of
the skull through the vertex without contrast.

[Series 2: brain · axial · 0.48mm/px · z∈[+155,+288]mm · 13 of 32 slices shown, 17 images]
[im 3/32  brain]
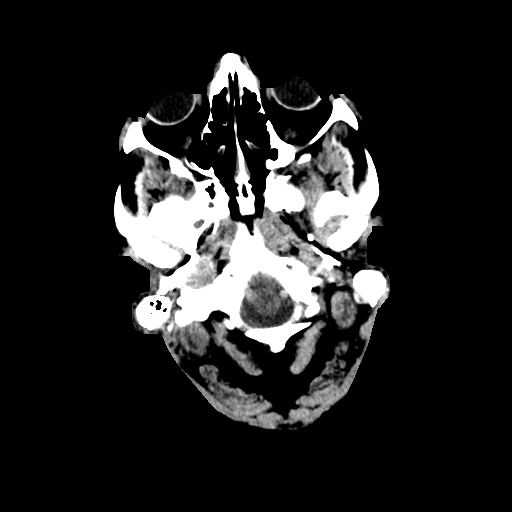
[im 3/32  bone]
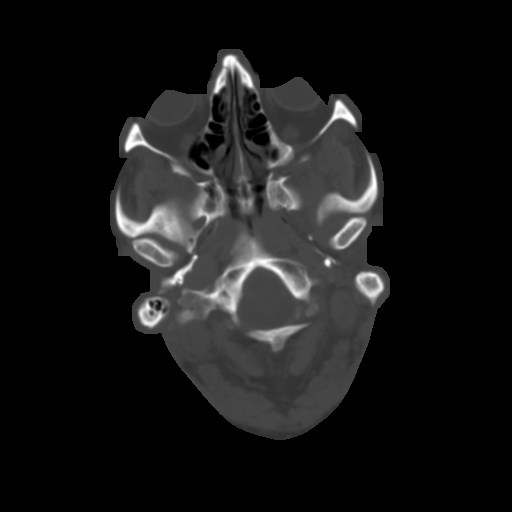
[im 5/32  brain]
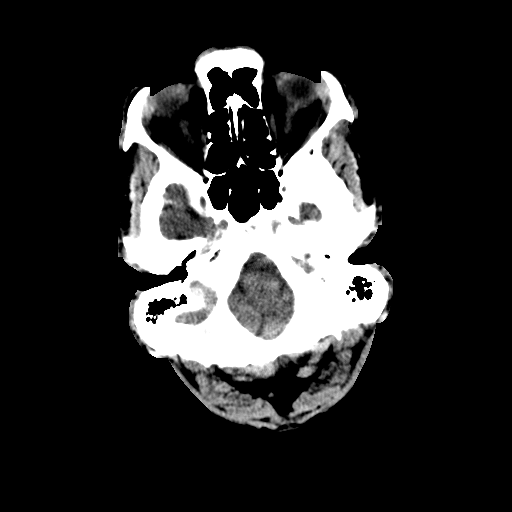
[im 7/32  brain]
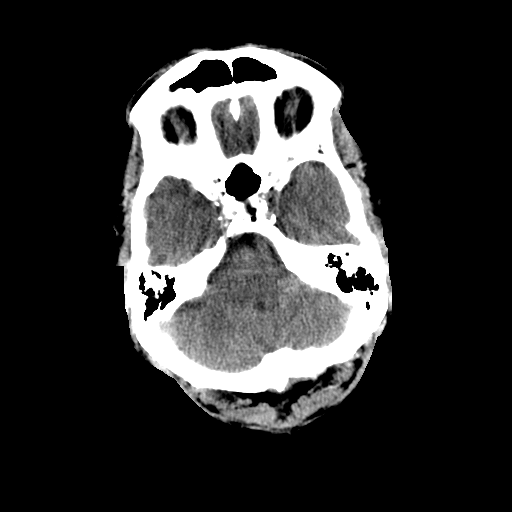
[im 9/32  brain]
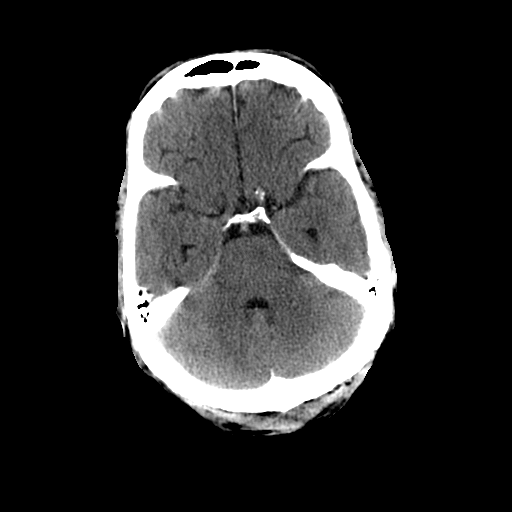
[im 12/32  brain]
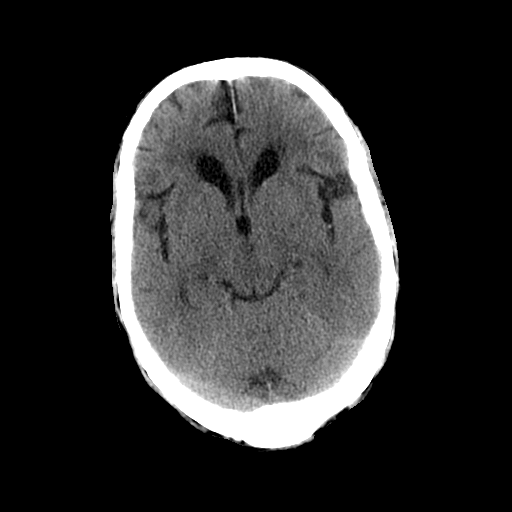
[im 12/32  bone]
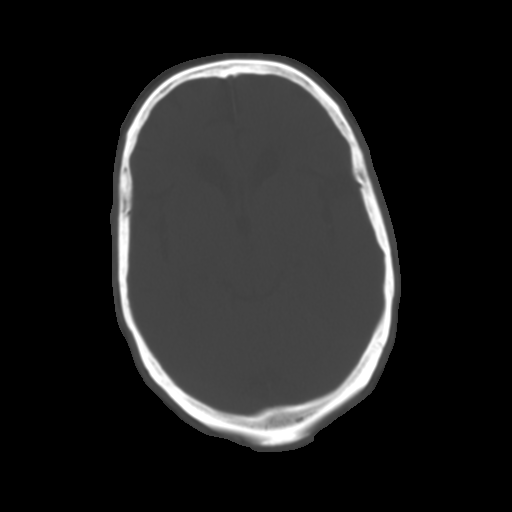
[im 14/32  brain]
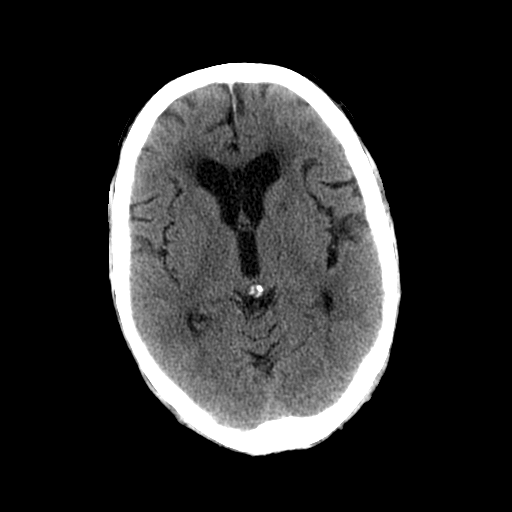
[im 16/32  brain]
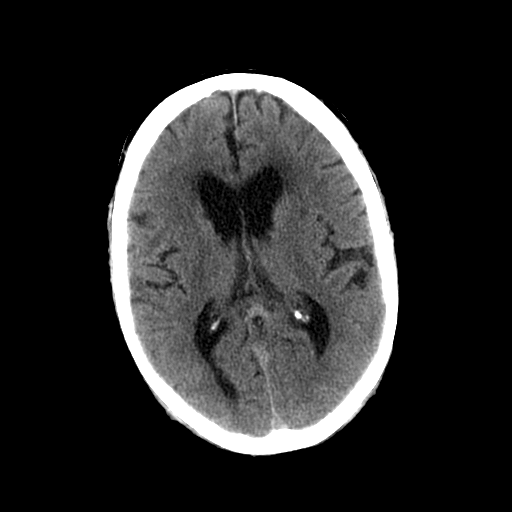
[im 18/32  brain]
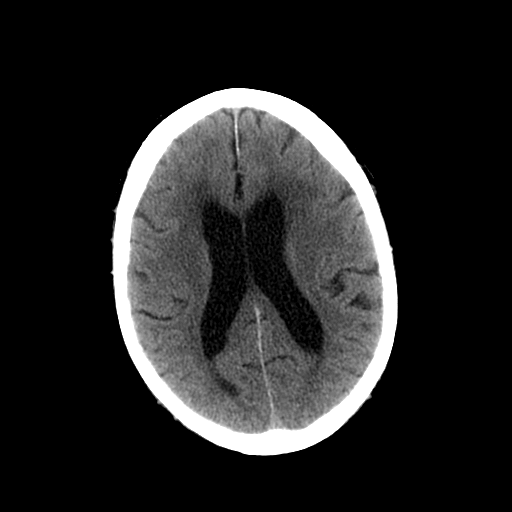
[im 20/32  brain]
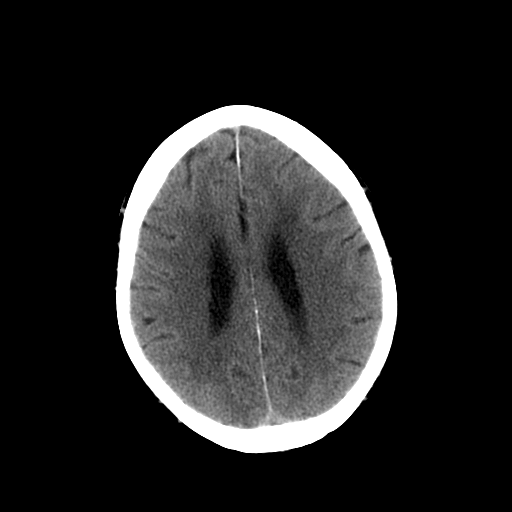
[im 20/32  bone]
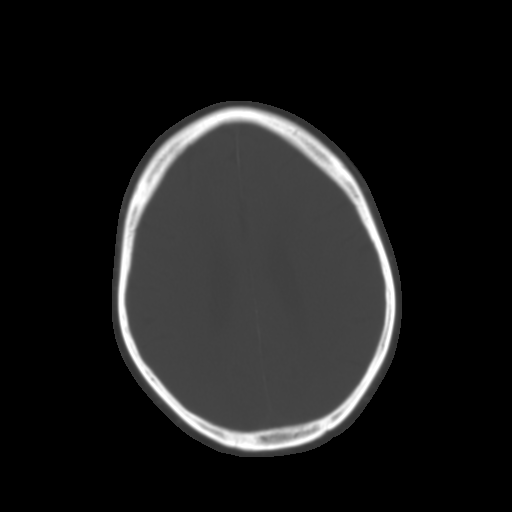
[im 23/32  brain]
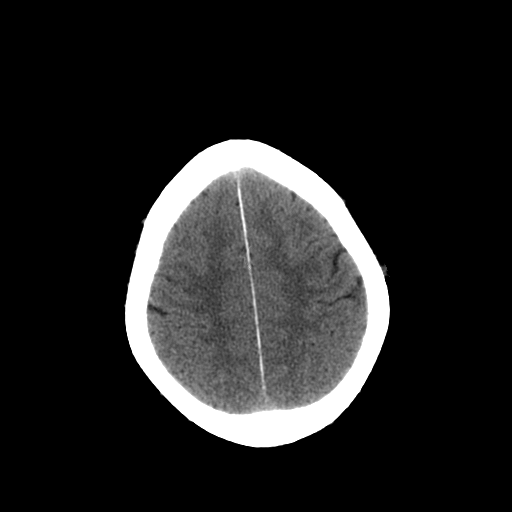
[im 25/32  brain]
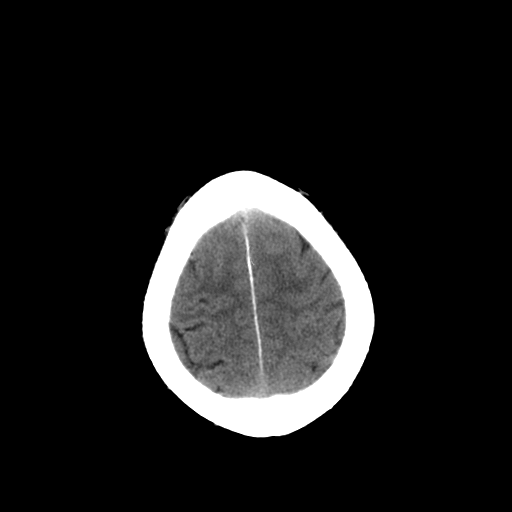
[im 27/32  brain]
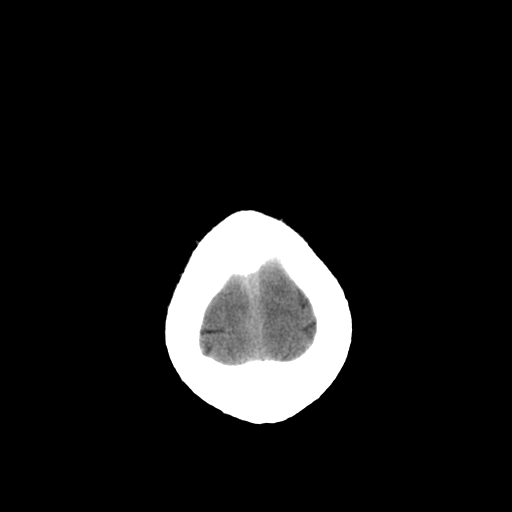
[im 29/32  brain]
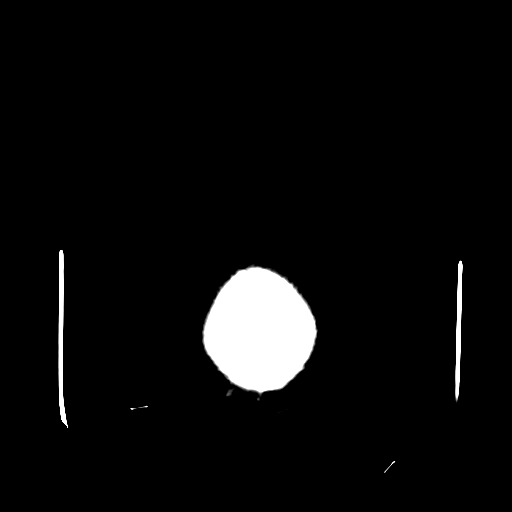
[im 29/32  bone]
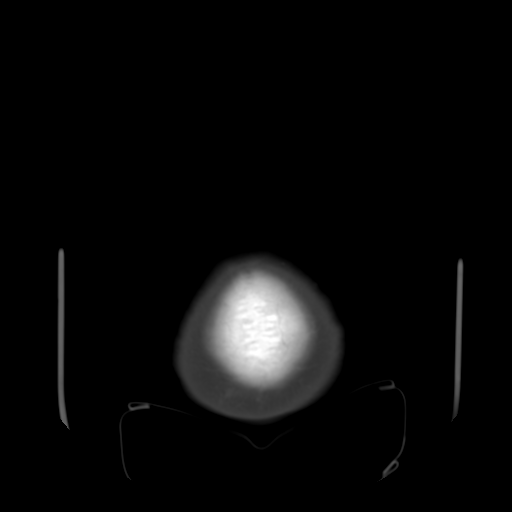

[13 of 30 positions shown; findings below may reference images not displayed]

FINDINGS: No mass lesion, mass effect, midline shift,
hydrocephalus, hemorrhage.  No acute territorial cortical
ischemia/infarct. Atrophy and chronic ischemic white matter disease
is present.  Visualized paranasal sinuses appear within normal
limits.
IMPRESSION: Mild atrophy and chronic ischemic white matter disease.  No acute
intracranial abnormality.

## 2012-08-17 NOTE — ED Provider Notes (Addendum)
History     CSN: [id number]  Arrival date & time 08/16/12  2154   First MD Initiated Contact with Patient 08/17/12 0050      Chief Complaint  Patient presents with  . Hypertension    (Consider location/radiation/quality/duration/timing/severity/associated sxs/prior treatment) Patient is a 71 y.o. male presenting with hypertension. The history is provided by the patient.  Hypertension This is a chronic problem. The current episode started more than 1 week ago. The problem occurs constantly. The problem has been gradually worsening. Associated symptoms include headaches. Nothing aggravates the symptoms. Nothing relieves the symptoms.    Past Medical History  Diagnosis Date  . History of colonoscopy   . History of colonic polyps   . Diverticulitis, colon   . CAD (coronary artery disease)   . Adenocarcinoma of prostate     s/p seed implantation  . Hyperlipidemia   . HTN (hypertension)   . Radiation proctitis     with chronic bleeding  . Axillary abscess     left    Past Surgical History  Procedure Date  . Coronary angioplasty with stent placement 04/2008    Family History  Problem Relation Age of Onset  . Heart disease Father   . Colon cancer Neg Hx     History  Substance Use Topics  . Smoking status: Former Smoker    Quit date: 11/22/1992  . Smokeless tobacco: Not on file  . Alcohol Use: Yes      Review of Systems  Neurological: Positive for headaches.  All other systems reviewed and are negative.    Allergies  Review of patient's allergies indicates no known allergies.  Home Medications   Current Outpatient Rx  Name Route Sig Dispense Refill  . ASPIRIN EC 325 MG PO TBEC Oral Take 325 mg by mouth daily.      Marland Kitchen CITALOPRAM HYDROBROMIDE 40 MG PO TABS Oral Take 20 mg by mouth daily.      Marland Kitchen CLOPIDOGREL BISULFATE 75 MG PO TABS Oral Take 1 tablet (75 mg total) by mouth daily. 30 tablet 9  . HYDRALAZINE HCL 50 MG PO TABS Oral Take 50 mg by mouth 3 (three)  times daily.    Marland Kitchen HYDROCHLOROTHIAZIDE 25 MG PO TABS Oral Take 25 mg by mouth daily.      Marland Kitchen LISINOPRIL 40 MG PO TABS Oral Take 20 mg by mouth daily.    Marland Kitchen NAPROXEN SODIUM 220 MG PO TABS Oral Take 220 mg by mouth 2 (two) times daily with a meal. For headache pain    . PROPRANOLOL HCL 10 MG PO TABS Oral Take 10 mg by mouth as needed. For anxiety    . VITAMIN B-6 50 MG PO TABS Oral Take 50 mg by mouth daily.      Marland Kitchen RISPERIDONE 2 MG PO TABS Oral Take 1 mg by mouth at bedtime.     Marland Kitchen SIMVASTATIN 80 MG PO TABS Oral Take 40 mg by mouth at bedtime.      . TAMSULOSIN HCL 0.4 MG PO CAPS Oral Take 0.4 mg by mouth daily.      Marland Kitchen ZOLPIDEM TARTRATE 10 MG PO TABS Oral Take 10 mg by mouth at bedtime.      Marland Kitchen NITROGLYCERIN 0.4 MG SL SUBL Sublingual Place 0.4 mg under the tongue every 5 (five) minutes as needed.        BP 187/109  Pulse 83  Temp 97.4 F (36.3 C) (Oral)  Resp 20  SpO2 96%  Physical Exam  Constitutional: He is oriented to person, place, and time. He appears well-developed and well-nourished.  HENT:  Head: Normocephalic and atraumatic.  Eyes: Conjunctivae normal are normal. Pupils are equal, round, and reactive to light.  Neck: Normal range of motion. Neck supple.  Cardiovascular: Normal rate, regular rhythm, normal heart sounds and intact distal pulses.   Pulmonary/Chest: Effort normal and breath sounds normal.  Abdominal: Soft. Bowel sounds are normal.  Neurological: He is alert and oriented to person, place, and time.  Skin: Skin is warm and dry.  Psychiatric: He has a normal mood and affect. His behavior is normal. Judgment and thought content normal.    ED Course  Procedures (including critical care time)  Labs Reviewed - No data to display No results found.   No diagnosis found.   Date: 08/17/2012  Rate: 88  Rhythm: normal sinus rhythm  QRS Axis: normal  Intervals: QT prolonged  ST/T Wave abnormalities: nonspecific ST changes  Conduction Disutrbances:none  Narrative  Interpretation:   Old EKG Reviewed: unchanged    MDM  + ha, htn,  Seen in ed in [place] earlier today where bp meds were manipulated.  States developed excruciating ha this evening  Which was atypical, and came to ed to be evaluated.  Appt with pmd tomorrow.  Will check bp, ct head,  reassess   bp improved.  Head ct neg.  Will dc to [facility] outpt,  Ret new/worsening sxs     Iverna Hammac Lytle Michaels, MD 08/17/12 0100  Ziaire Bieser Lytle Michaels, MD 08/17/12 [phone]

## 2012-08-17 NOTE — ED Notes (Signed)
Pt took Hydralazine 50 mg PO around 1600 on 08/16/12 prior to arrival. Before then, pt sts BP was 208/100.

## 2012-08-31 ENCOUNTER — Ambulatory Visit: Payer: TRICARE For Life (TFL) | Admitting: Cardiology

## 2012-08-31 ENCOUNTER — Encounter: Payer: Self-pay | Admitting: Cardiology

## 2012-09-01 ENCOUNTER — Ambulatory Visit (INDEPENDENT_AMBULATORY_CARE_PROVIDER_SITE_OTHER): Payer: Medicare Other | Admitting: Cardiology

## 2012-09-01 ENCOUNTER — Encounter: Payer: Self-pay | Admitting: Cardiology

## 2012-09-01 VITALS — BP 130/60 | HR 88 | Ht 69.0 in | Wt 188.0 lb

## 2012-09-01 DIAGNOSIS — I2510 Atherosclerotic heart disease of native coronary artery without angina pectoris: Secondary | ICD-10-CM

## 2012-09-01 NOTE — Progress Notes (Signed)
HPI The patient presents for follow up of his CAD.  Since I last saw him he has done relatively well.  However, I did review an ER report recently when he presented with hypertension. He had been taking lisinopril but this was stopped because of cough. He had been off of amlodipine because of edema. He is now back on amlodipine. He also reports that he was just started on Benicar yesterday. He is walking routinely. The patient denies any new symptoms such as chest discomfort, neck or arm discomfort. There has been no new shortness of breath, PND or orthopnea. There have been no reported palpitations, presyncope or syncope.    No Known Allergies  Current Outpatient Prescriptions  Medication Sig Dispense Refill  . amLODipine (NORVASC) 5 MG tablet Take 5 mg by mouth daily.      Marland Kitchen aspirin 81 MG tablet Take 81 mg by mouth daily.      . citalopram (CELEXA) 40 MG tablet Take 20 mg by mouth daily.        . clopidogrel (PLAVIX) 75 MG tablet Take 1 tablet (75 mg total) by mouth daily.  30 tablet  9  . hydrALAZINE (APRESOLINE) 50 MG tablet Take 50 mg by mouth 3 (three) times daily.      . hydrochlorothiazide 25 MG tablet Take 25 mg by mouth daily.        . naproxen sodium (ANAPROX) 220 MG tablet Take 220 mg by mouth as needed. For headache pain      . nitroGLYCERIN (NITROSTAT) 0.4 MG SL tablet Place 0.4 mg under the tongue every 5 (five) minutes as needed.        . propranolol (INDERAL) 10 MG tablet Take 10 mg by mouth as needed. For anxiety      . pyridOXINE (VITAMIN B-6) 50 MG tablet Take 50 mg by mouth daily.        . risperiDONE (RISPERDAL) 2 MG tablet Take 1 mg by mouth at bedtime.       . simvastatin (ZOCOR) 80 MG tablet Take 40 mg by mouth at bedtime.        . Tamsulosin HCl (FLOMAX) 0.4 MG CAPS Take 0.4 mg by mouth daily.        Marland Kitchen zolpidem (AMBIEN) 10 MG tablet Take 10 mg by mouth at bedtime.          Past Medical History  Diagnosis Date  . History of colonoscopy   . History of colonic  polyps   . Diverticulitis, colon   . CAD (coronary artery disease)   . Adenocarcinoma of prostate     s/p seed implantation  . Hyperlipidemia   . HTN (hypertension)   . Radiation proctitis     with chronic bleeding  . Axillary abscess     left    Past Surgical History  Procedure Date  . Coronary angioplasty with stent placement 04/2008   ROS:  As stated in the HPI and negative for all other systems.  PHYSICAL EXAM BP 130/60  Pulse 88  Ht 5\' 9"  (1.753 m)  Wt 188 lb (85.276 kg)  BMI 27.76 kg/m2 GENERAL:  Well appearing HEENT:  Pupils equal round and reactive, fundi not visualized, oral mucosa unremarkable NECK:  No jugular venous distention, waveform within normal limits, carotid upstroke brisk and symmetric, no bruits, no thyromegaly LYMPHATICS:  No cervical, inguinal adenopathy LUNGS:  Clear to auscultation bilaterally BACK:  No CVA tenderness CHEST:  Unremarkable HEART:  PMI not displaced or sustained,S1  and S2 within normal limits, no S3, no S4, no clicks, no rubs, no murmurs ABD:  Flat, positive bowel sounds normal in frequency in pitch, no bruits, no rebound, no guarding, no midline pulsatile mass, no hepatomegaly, no splenomegaly EXT:  2 plus pulses throughout, trace edema, no cyanosis no clubbing SKIN:  No rashes no nodules NEURO:  Cranial nerves II through XII grossly intact, motor grossly intact throughout PSYCH:  Cognitively intact, oriented to person place and time   EKG:  Sinus rhythm, rate 58, axis within normal limits, intervals within normal limits, no acute ST-T wave changes.   ASSESSMENT AND PLAN  CAD -  He has had no new symptoms since a stress test last year. He will continue with risk reduction. No further cardiovascular testing is indicated.  EDEMA -  This was related to Norvasc but he does better with this medication in terms of his blood pressure so he will continue his lower dose.  HYPERLIPIDEMIA -  I reviewed his most recent lipids. These are  at target. No change in therapy is indicated.  HYPERTENSION -  He said his blood pressure is not typically controlled at home. It is good year. He was just started on Benicar. I will last him to bring his blood pressure cuff in to correlate it and make sure it is accurate. We will keep his blood pressure diary and follow with  Nadean Corwin, MD

## 2012-09-01 NOTE — Patient Instructions (Addendum)
The current medical regimen is effective;  continue present plan and medications.  Follow up in 1 year with Dr Hochrein.  You will receive a letter in the mail 2 months before you are due.  Please call us when you receive this letter to schedule your follow up appointment.  

## 2012-09-07 ENCOUNTER — Ambulatory Visit (INDEPENDENT_AMBULATORY_CARE_PROVIDER_SITE_OTHER): Payer: Medicare Other

## 2012-09-07 VITALS — BP 146/70 | HR 80 | Resp 12 | Wt 190.2 lb

## 2012-09-07 DIAGNOSIS — I10 Essential (primary) hypertension: Secondary | ICD-10-CM

## 2012-09-07 NOTE — Progress Notes (Signed)
[name] Colon presents with his bp cuff for a bp check to find out if his bp monitor is accurate.  Manual BP=146/70  P=80.  [name] Colon's bp monitor reads 130/74 and 81.  Systolic number is off.  Rx for a new bp monitor was given to [name] Colon.  I also reminded him to check his bp 1-2 hours after taking his bp meds.

## 2012-10-09 ENCOUNTER — Encounter: Payer: Self-pay | Admitting: Cardiology

## 2013-02-07 ENCOUNTER — Other Ambulatory Visit: Payer: Self-pay | Admitting: Cardiology

## 2013-08-13 ENCOUNTER — Encounter: Payer: Self-pay | Admitting: Internal Medicine

## 2013-08-15 ENCOUNTER — Encounter: Payer: Self-pay | Admitting: Nurse Practitioner

## 2013-08-20 ENCOUNTER — Ambulatory Visit (INDEPENDENT_AMBULATORY_CARE_PROVIDER_SITE_OTHER): Payer: Medicare Other | Admitting: Nurse Practitioner

## 2013-08-20 ENCOUNTER — Telehealth: Payer: Self-pay

## 2013-08-20 ENCOUNTER — Encounter: Payer: Self-pay | Admitting: Nurse Practitioner

## 2013-08-20 VITALS — BP 150/80 | HR 72 | Ht 69.0 in | Wt 194.0 lb

## 2013-08-20 DIAGNOSIS — Z86010 Personal history of colonic polyps: Secondary | ICD-10-CM

## 2013-08-20 DIAGNOSIS — Z7901 Long term (current) use of anticoagulants: Secondary | ICD-10-CM

## 2013-08-20 MED ORDER — MOVIPREP 100 G PO SOLR: ORAL | Status: DC

## 2013-08-20 NOTE — Patient Instructions (Addendum)
You have been scheduled for a colonoscopy with propofol. Please follow written instructions given to you at your visit today.  Please pick up your prep kit at the pharmacy within the next 1-3 days. If you use inhalers (even only as needed), please bring them with you on the day of your procedure. Your physician has requested that you go to www.startemmi.com and enter the access code given to you at your visit today. This web site gives a general overview about your procedure. However, you should still follow specific instructions given to you by our office regarding your preparation for the procedure.  You will be contaced by our office prior to your procedure for directions on holding your Plavix.  If you do not hear from our office 1 week prior to your scheduled procedure, please call [phone] to discuss.  I appreciate the opportunity to care for you.

## 2013-08-20 NOTE — Telephone Encounter (Signed)
[facility] [address]  [place] [address]  08/20/2013    RE: [name] Colon DOB: 05-29-1941 MRN: [id number]   Dear Dr. Rollene Rotunda,    We have scheduled the above patient for an endoscopic procedure. Our records show that he is on anticoagulation therapy.   Please advise as to how long the patient may come off his therapy of Plavix prior to the colonoscopy procedure, which is scheduled for 09/11/13 with Dr. Yancey Flemings.  Please fax back/ or route the completed form to Neisha Hinger [place], CMA (AAMA) at [phone] .   Sincerely,   Vincenzo Stave [place], CMA (AAMA)

## 2013-08-20 NOTE — Progress Notes (Signed)
Agree with initial assessment and plans 

## 2013-08-20 NOTE — Progress Notes (Signed)
HPI :  Patient is a 72 year old male known to Dr. Marina Goodell.  He has a history of adenomatous colon polyps. His last colonoscopy was in October 2009, done for evaluation of Hemoccult-positive stool and polyp surveillance. 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  Patient is due for surveillance colonoscopy. He has CAD and is s/p stenting of left circumflex with drug-eluting stent in 2009. Patient takes Plavix as well as several antihypertensives and he is followed by Dr. Antoine Poche.  No recent chest pain. He has no GI complaints  Past Medical History  Diagnosis Date  . History of colonic polyps   . Diverticulitis, colon   . CAD (coronary artery disease)   . Adenocarcinoma of prostate     s/p seed implantation  . Hyperlipidemia   . HTN (hypertension)   . Radiation proctitis     with chronic bleeding  . Axillary abscess     left    Family History  Problem Relation Age of Onset  . Heart disease Father   . Colon cancer Neg Hx    History  Substance Use Topics  . Smoking status: Former Smoker    Quit date: 11/22/1992  . Smokeless tobacco: Never Used  . Alcohol Use: No   Current Outpatient Prescriptions  Medication Sig Dispense Refill  . amLODipine (NORVASC) 5 MG tablet Take 5 mg by mouth daily.      Marland Kitchen aspirin 81 MG tablet Take 81 mg by mouth daily.      Marland Kitchen atenolol (TENORMIN) 50 MG tablet Take 50 mg by mouth daily.      . citalopram (CELEXA) 40 MG tablet Take 20 mg by mouth daily.        . clopidogrel (PLAVIX) 75 MG tablet take 1 tablet by mouth once daily  30 tablet  9  . hydrALAZINE (APRESOLINE) 50 MG tablet Take 50 mg by mouth 3 (three) times daily.      . hydrochlorothiazide 25 MG tablet Take 25 mg by mouth daily.        Marland Kitchen losartan (COZAAR) 25 MG tablet Take 25 mg by mouth daily. Patient not sure of dosage      . naproxen sodium (ANAPROX) 220 MG tablet Take 220 mg by mouth as needed. For headache pain      .  nitroGLYCERIN (NITROSTAT) 0.4 MG SL tablet Place 0.4 mg under the tongue every 5 (five) minutes as needed.        . propranolol (INDERAL) 10 MG tablet Take 10 mg by mouth as needed. For anxiety      . pyridOXINE (VITAMIN B-6) 50 MG tablet Take 50 mg by mouth daily.        . risperiDONE (RISPERDAL) 2 MG tablet Take 1 mg by mouth at bedtime.       . simvastatin (ZOCOR) 80 MG tablet Take 40 mg by mouth at bedtime.        . Tamsulosin HCl (FLOMAX) 0.4 MG CAPS Take 0.4 mg by mouth daily.        Marland Kitchen zolpidem (AMBIEN) 10 MG tablet Take 10 mg by mouth at bedtime.         No current facility-administered medications for this visit.   No Known Allergies   Review of Systems: All systems reviewed and negative except where noted in HPI.   Physical Exam: BP 150/80  Pulse 72  Ht 5\' 9"  (1.753 m)  Wt 194 lb (87.998 kg)  BMI 28.64 kg/m2 Constitutional:  Pleasant,well-developed, black male in no acute distress. HEENT: Normocephalic and atraumatic. Conjunctivae are normal. No scleral icterus. Neck supple.  Cardiovascular: Normal rate, regular rhythm.  Pulmonary/chest: Effort normal and breath sounds normal. No wheezing, rales or rhonchi. Abdominal: Soft, nondistended, nontender. Bowel sounds active throughout. There are no masses palpable. No hepatomegaly. Extremities: no edema Neurological: Alert and oriented to person place and time. Skin: Skin is warm and dry. No rashes noted. Psychiatric: Normal mood and affect. Behavior is normal.   ASSESSMENT AND PLAN: 71. 72 year old male with a history of adenomatous colon polyps (2004, 2006, 2009), now due for surveillance colonoscopy.  We will contact his cardiologist, Dr. Antoine Poche about holding Plavix for procedure. The risks, benefits, and alternatives to colonoscopy with possible biopsy and possible polypectomy were discussed with the patient and he consents to proceed.   2. CAD, history of stent placement in 2009. Patient is on chronic Plavix and followed  by [facility].    3. HTN, on several antihypertensive medications.  4.  ? Depression based on medication profile. Also, patient on Risperdal.

## 2013-08-21 NOTE — Telephone Encounter (Signed)
Forwarded to Patti [place] for her knowledge.

## 2013-08-21 NOTE — Telephone Encounter (Signed)
Ok to hold Plavix for 5 days as needed for the procedure.

## 2013-08-21 NOTE — Telephone Encounter (Signed)
Spoke with patient who was driving, he is going to call me back tomorrow for instructions on holding his Plavix.

## 2013-08-22 NOTE — Telephone Encounter (Signed)
Patient informed to hold Plavix for 5 days prior to procedure, he verbalized understanding.

## 2013-09-03 ENCOUNTER — Ambulatory Visit (INDEPENDENT_AMBULATORY_CARE_PROVIDER_SITE_OTHER): Payer: Medicare Other | Admitting: Cardiology

## 2013-09-03 ENCOUNTER — Encounter: Payer: Self-pay | Admitting: Cardiology

## 2013-09-03 VITALS — BP 160/71 | HR 57 | Ht 69.0 in | Wt 195.0 lb

## 2013-09-03 DIAGNOSIS — I2510 Atherosclerotic heart disease of native coronary artery without angina pectoris: Secondary | ICD-10-CM

## 2013-09-03 DIAGNOSIS — I10 Essential (primary) hypertension: Secondary | ICD-10-CM

## 2013-09-03 NOTE — Patient Instructions (Signed)
Please stop your Plavix Continue all other medications.  Follow up in 1 year with Dr Antoine Poche.  You will receive a letter in the mail 2 months before you are due.  Please call us when you receive this letter to schedule your follow up appointment.

## 2013-09-03 NOTE — Progress Notes (Signed)
HPI The patient presents for follow up of his CAD.  Since I last saw him he has done well.  He does some walking.  The patient denies any new symptoms such as chest discomfort, neck or arm discomfort. There has been no new shortness of breath, PND or orthopnea. There have been no reported palpitations, presyncope or syncope.  He has had none of the symptoms that was his previous angina.   No Known Allergies  Current Outpatient Prescriptions  Medication Sig Dispense Refill  . amLODipine (NORVASC) 5 MG tablet Take 5 mg by mouth daily.      Marland Kitchen aspirin 81 MG tablet Take 81 mg by mouth daily.      Marland Kitchen atenolol (TENORMIN) 50 MG tablet Take 50 mg by mouth daily.      . citalopram (CELEXA) 40 MG tablet Take 20 mg by mouth daily.        . clopidogrel (PLAVIX) 75 MG tablet take 1 tablet by mouth once daily  30 tablet  9  . hydrALAZINE (APRESOLINE) 50 MG tablet Take 50 mg by mouth 3 (three) times daily.      . hydrochlorothiazide 25 MG tablet Take 25 mg by mouth daily.        Marland Kitchen losartan (COZAAR) 25 MG tablet Take 25 mg by mouth daily. Patient not sure of dosage      . MOVIPREP 100 G SOLR Use per prep instructions  1 kit  0  . naproxen sodium (ANAPROX) 220 MG tablet Take 220 mg by mouth as needed. For headache pain      . nitroGLYCERIN (NITROSTAT) 0.4 MG SL tablet Place 0.4 mg under the tongue every 5 (five) minutes as needed.        . propranolol (INDERAL) 10 MG tablet Take 10 mg by mouth as needed. For anxiety      . pyridOXINE (VITAMIN B-6) 50 MG tablet Take 50 mg by mouth daily.        . risperiDONE (RISPERDAL) 2 MG tablet Take 1 mg by mouth at bedtime.       . simvastatin (ZOCOR) 80 MG tablet Take 40 mg by mouth at bedtime.        . Tamsulosin HCl (FLOMAX) 0.4 MG CAPS Take 0.4 mg by mouth daily.        Marland Kitchen zolpidem (AMBIEN) 10 MG tablet Take 10 mg by mouth at bedtime.         No current facility-administered medications for this visit.    Past Medical History  Diagnosis Date  . History of  colonic polyps   . Diverticulitis, colon   . CAD (coronary artery disease)   . Adenocarcinoma of prostate     s/p seed implantation  . Hyperlipidemia   . HTN (hypertension)   . Radiation proctitis     with chronic bleeding  . Axillary abscess     left    Past Surgical History  Procedure Laterality Date  . Coronary angioplasty with stent placement  04/2008   ROS:  As stated in the HPI and negative for all other systems.  PHYSICAL EXAM BP 160/71  Pulse 57  Ht 5\' 9"  (1.753 m)  Wt 195 lb (88.451 kg)  BMI 28.78 kg/m2 GENERAL:  Well appearing NECK:  No jugular venous distention, waveform within normal limits, carotid upstroke brisk and symmetric, no bruits, no thyromegaly LUNGS:  Clear to auscultation bilaterally BACK:  No CVA tenderness CHEST:  Unremarkable HEART:  PMI not displaced or sustained,S1 and  S2 within normal limits, no S3, no S4, no clicks, no rubs, no murmurs ABD:  Flat, positive bowel sounds normal in frequency in pitch, no bruits, no rebound, no guarding, no midline pulsatile mass, no hepatomegaly, no splenomegaly EXT:  2 plus pulses throughout, trace edema, no cyanosis no clubbing   EKG:  Sinus rhythm, rate 57, axis within normal limits, intervals within normal limits, no acute ST-T wave changes.  09/03/2013   ASSESSMENT AND PLAN  CAD -  He has had no new symptoms since a stress test in 2012.  He has had no new symptoms since that time. He will continue with risk reduction. No further cardiovascular testing is indicated.  He can stop the Plavix.  HYPERLIPIDEMIA -  I have not seen recent lipids.  This is followed by Nadean Corwin, MD.  I will defer to his management  HYPERTENSION -  His blood pressure is slightly elevated.  However this is unusual.  His readings at home are SBP 120 - 140.  He will continue the current therapy.

## 2013-09-10 ENCOUNTER — Encounter: Payer: Self-pay | Admitting: Cardiology

## 2013-09-11 ENCOUNTER — Encounter: Payer: Self-pay | Admitting: Internal Medicine

## 2013-09-11 ENCOUNTER — Ambulatory Visit (AMBULATORY_SURGERY_CENTER): Payer: Medicare Other | Admitting: Internal Medicine

## 2013-09-11 VITALS — BP 166/81 | HR 48 | Temp 97.9°F | Resp 21 | Ht 69.0 in | Wt 194.0 lb

## 2013-09-11 DIAGNOSIS — D126 Benign neoplasm of colon, unspecified: Secondary | ICD-10-CM

## 2013-09-11 DIAGNOSIS — Z86010 Personal history of colonic polyps: Secondary | ICD-10-CM

## 2013-09-11 MED ORDER — SODIUM CHLORIDE 0.9 % IV SOLN: 500.0000 mL | INTRAVENOUS | Status: DC

## 2013-09-11 NOTE — Progress Notes (Signed)
Called to room to assist during endoscopic procedure.  Patient ID and intended procedure confirmed with present staff. Received instructions for my participation in the procedure from the performing physician.  

## 2013-09-11 NOTE — Progress Notes (Signed)
Patient did not experience any of the following events: a burn prior to discharge; a fall within the facility; wrong site/side/patient/procedure/implant event; or a hospital transfer or hospital admission upon discharge from the facility. (G8907) Patient did not have preoperative order for IV antibiotic SSI prophylaxis. (G8918)  

## 2013-09-11 NOTE — Patient Instructions (Addendum)

## 2013-09-11 NOTE — Op Note (Signed)
[facility] [address] [place], [address]   COLONOSCOPY PROCEDURE REPORT  PATIENT: [name] Colon, [name] Colon  MR#: [id number] BIRTHDATE: 1941/11/04 , 71  yrs. old GENDER: Male ENDOSCOPIST: Roxy Cedar, MD REFERRED [id number] Program Recall PROCEDURE DATE:  09/11/2013 PROCEDURE:   Colonoscopy with snare polypectomy x20 Codes for extended service for time (greater than 30 minutes) and technical (20 polyps) First Screening Colonoscopy - Avg.  risk and is 50 yrs.  old or older - No.  Prior Negative Screening - Now for repeat screening. N/A  History of Adenoma - Now for follow-up colonoscopy & has been > or = to 3 yrs.  Yes hx of adenoma.  Has been 3 or more years since last colonoscopy.  Polyps Removed Today? Yes. ASA CLASS:   Class III INDICATIONS:Patient's personal history of adenomatous colon polyps.  MEDICATIONS: MAC sedation, administered by CRNA and propofol (Diprivan) 300mg  IV  DESCRIPTION OF PROCEDURE:   After the risks benefits and alternatives of the procedure were thoroughly explained, informed consent was obtained.  A digital rectal exam revealed no abnormalities of the rectum.   The LB [id number]  endoscope was introduced through the anus and advanced to the cecum, which was identified by both the appendix and ileocecal valve. No adverse events experienced.   The quality of the prep was good, using MoviPrep  The instrument was then slowly withdrawn as the colon was fully examined.      COLON FINDINGS: The colonoscope was advanced to the cecum without difficulty.  Approximately 20 polyps were found throughout the colon.  18 polyps measured less than 8 mm and were removed with cold snare.  2 larger polyps measuring 12 and 18 mm respectively were removed with hot snare.  Most, but not all polyps were retrieved.  Moderate left-sided diverticulosis was present. 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 Retroflexed views  revealed no abnormalities. The time to cecum=3 minutes 20 seconds.  Withdrawal time=28 minutes 09 seconds.  The scope was withdrawn and the procedure completed. COMPLICATIONS: There were no complications.  ENDOSCOPIC IMPRESSION: 1. 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 Left-sided diverticulosis 3. Radiation proctitis.  RECOMMENDATIONS: 1. Repeat Colonoscopy in 1 year.   Note: The patient's Plavix has been discontinued by his cardiologist   eSigned:  Roxy Cedar, MD 09/11/2013 5:05 PM   cc: Lucky Cowboy, MD and The Patient   PATIENT NAME:  [name] Colon, [name] Colon MR#: [id number]

## 2013-09-11 NOTE — Progress Notes (Signed)
A/ox3 pleased with MAC, report to Jane RN 

## 2013-09-12 ENCOUNTER — Telehealth: Payer: Self-pay | Admitting: *Deleted

## 2013-09-12 NOTE — Telephone Encounter (Signed)
  Follow up Call-  Call back number 09/11/2013  Post procedure Call Back phone  # [phone]  Permission to leave phone message Yes     Patient questions:  Do you have a fever, pain , or abdominal swelling? no Pain Score  0 *  Have you tolerated food without any problems? yes  Have you been able to return to your normal activities? yes  Do you have any questions about your discharge instructions: Diet   no Medications  no Follow up visit  no  Do you have questions or concerns about your Care? no  Actions: * If pain score is 4 or above: No action needed, pain <4.

## 2013-09-18 ENCOUNTER — Encounter: Payer: Self-pay | Admitting: Internal Medicine

## 2013-09-27 ENCOUNTER — Other Ambulatory Visit: Payer: Self-pay

## 2013-09-27 ENCOUNTER — Other Ambulatory Visit: Payer: Self-pay | Admitting: Physician Assistant

## 2013-09-27 NOTE — Telephone Encounter (Signed)
RX Ambien 10 mg called to pharmacy

## 2013-11-19 ENCOUNTER — Other Ambulatory Visit: Payer: Self-pay | Admitting: Internal Medicine

## 2013-12-13 ENCOUNTER — Encounter: Payer: Self-pay | Admitting: Physician Assistant

## 2013-12-13 ENCOUNTER — Ambulatory Visit (INDEPENDENT_AMBULATORY_CARE_PROVIDER_SITE_OTHER): Payer: Medicare Other | Admitting: Physician Assistant

## 2013-12-13 VITALS — BP 120/70 | HR 56 | Temp 97.9°F | Resp 16 | Ht 70.0 in | Wt 196.0 lb

## 2013-12-13 DIAGNOSIS — I2510 Atherosclerotic heart disease of native coronary artery without angina pectoris: Secondary | ICD-10-CM

## 2013-12-13 DIAGNOSIS — E785 Hyperlipidemia, unspecified: Secondary | ICD-10-CM

## 2013-12-13 DIAGNOSIS — Z79899 Other long term (current) drug therapy: Secondary | ICD-10-CM

## 2013-12-13 DIAGNOSIS — I10 Essential (primary) hypertension: Secondary | ICD-10-CM

## 2013-12-13 DIAGNOSIS — E119 Type 2 diabetes mellitus without complications: Secondary | ICD-10-CM

## 2013-12-13 LAB — CBC WITH DIFFERENTIAL/PLATELET
BASOS ABS: 0 10*3/uL (ref 0.0–0.1)
BASOS PCT: 1 % (ref 0–1)
Eosinophils Absolute: 0.1 10*3/uL (ref 0.0–0.7)
Eosinophils Relative: 2 % (ref 0–5)
HCT: 44.4 % (ref 39.0–52.0)
HEMOGLOBIN: 14.8 g/dL (ref 13.0–17.0)
LYMPHS PCT: 40 % (ref 12–46)
Lymphs Abs: 1.4 10*3/uL (ref 0.7–4.0)
MCH: 26.1 pg (ref 26.0–34.0)
MCHC: 33.3 g/dL (ref 30.0–36.0)
MCV: 78.2 fL (ref 78.0–100.0)
MONOS PCT: 8 % (ref 3–12)
Monocytes Absolute: 0.3 10*3/uL (ref 0.1–1.0)
NEUTROS ABS: 1.7 10*3/uL (ref 1.7–7.7)
NEUTROS PCT: 49 % (ref 43–77)
Platelets: 185 10*3/uL (ref 150–400)
RBC: 5.68 MIL/uL (ref 4.22–5.81)
RDW: 16.3 % — AB (ref 11.5–15.5)
WBC: 3.4 10*3/uL — AB (ref 4.0–10.5)

## 2013-12-13 LAB — BASIC METABOLIC PANEL WITH GFR
BUN: 12 mg/dL (ref 6–23)
CHLORIDE: 103 meq/L (ref 96–112)
CO2: 26 mEq/L (ref 19–32)
Calcium: 9.9 mg/dL (ref 8.4–10.5)
Creat: 1.1 mg/dL (ref 0.50–1.35)
GFR, EST AFRICAN AMERICAN: 77 mL/min
GFR, EST NON AFRICAN AMERICAN: 66 mL/min
Glucose, Bld: 139 mg/dL — ABNORMAL HIGH (ref 70–99)
POTASSIUM: 3.4 meq/L — AB (ref 3.5–5.3)
SODIUM: 142 meq/L (ref 135–145)

## 2013-12-13 LAB — HEMOGLOBIN A1C
Hgb A1c MFr Bld: 6.2 % — ABNORMAL HIGH (ref <5.7)
Mean Plasma Glucose: 131 mg/dL — ABNORMAL HIGH (ref <117)

## 2013-12-13 LAB — HEPATIC FUNCTION PANEL
ALT: 13 U/L (ref 0–53)
AST: 21 U/L (ref 0–37)
Albumin: 4.3 g/dL (ref 3.5–5.2)
Alkaline Phosphatase: 44 U/L (ref 39–117)
BILIRUBIN DIRECT: 0.2 mg/dL (ref 0.0–0.3)
BILIRUBIN INDIRECT: 0.5 mg/dL (ref 0.0–0.9)
Total Bilirubin: 0.7 mg/dL (ref 0.3–1.2)
Total Protein: 6.8 g/dL (ref 6.0–8.3)

## 2013-12-13 LAB — MAGNESIUM: Magnesium: 1.7 mg/dL (ref 1.5–2.5)

## 2013-12-13 LAB — LIPID PANEL
CHOL/HDL RATIO: 3.4 ratio
Cholesterol: 147 mg/dL (ref 0–200)
HDL: 43 mg/dL (ref >39)
LDL CALC: 89 mg/dL (ref 0–99)
TRIGLYCERIDES: 76 mg/dL (ref <150)
VLDL: 15 mg/dL (ref 0–40)

## 2013-12-13 LAB — TSH: TSH: 2.021 u[IU]/mL (ref 0.350–4.500)

## 2013-12-13 MED ORDER — POTASSIUM CHLORIDE ER 10 MEQ PO TBCR: 10.0000 meq | EXTENDED_RELEASE_TABLET | Freq: Two times a day (BID) | ORAL | Status: DC

## 2013-12-13 NOTE — Addendum Note (Signed)
Addended by: Vicie Mutters R on: 12/13/2013 06:10 PM   Modules accepted: Orders

## 2013-12-13 NOTE — Progress Notes (Signed)
HPI Patient presents for 3 month follow up with hypertension, hyperlipidemia, diabetes and vitamin D. Patient's blood pressure has been controlled at home, today their BP is BP: 120/70 mmHg  Patient denies chest pain, shortness of breath, dizziness.  Patient's cholesterol is diet controlled. In addition they are on zocor and denies myalgias. The cholesterol last visit was LDL 132 which is not at goal of less than 70.  The patient has been working on diet and exercise for diabetes, and denies changes in vision, polys, and paresthesias. Last A1C 6.5 (6.0)  His last potassium was 3.4. He has CKD with last GFR 54 and BUN 14, Cr 1.31. Vitamin D 36.  Recent colonoscopy with Dr. Henrene Pastor that will be repeated in one year for due to 20 polyps.  Patient is on Vitamin D supplement.   Current Medications:  Current Outpatient Prescriptions on File Prior to Visit  Medication Sig Dispense Refill  . amLODipine (NORVASC) 5 MG tablet Take 5 mg by mouth daily.      Marland Kitchen aspirin 81 MG tablet Take 81 mg by mouth daily.      Marland Kitchen atenolol (TENORMIN) 100 MG tablet take 1 tablet by mouth once daily for blood pressure  90 tablet  1  . atenolol (TENORMIN) 50 MG tablet Take 50 mg by mouth daily.      . citalopram (CELEXA) 40 MG tablet Take 20 mg by mouth daily.        . hydrALAZINE (APRESOLINE) 50 MG tablet Take 50 mg by mouth 3 (three) times daily.      Marland Kitchen losartan (COZAAR) 25 MG tablet Take 25 mg by mouth daily. Patient not sure of dosage      . naproxen sodium (ANAPROX) 220 MG tablet Take 220 mg by mouth as needed. For headache pain      . nitroGLYCERIN (NITROSTAT) 0.4 MG SL tablet Place 0.4 mg under the tongue every 5 (five) minutes as needed.        . propranolol (INDERAL) 10 MG tablet Take 10 mg by mouth as needed. For anxiety      . pyridOXINE (VITAMIN B-6) 50 MG tablet Take 50 mg by mouth daily.        . risperiDONE (RISPERDAL) 2 MG tablet Take 1 mg by mouth at bedtime.       . simvastatin (ZOCOR) 80 MG tablet Take 40 mg  by mouth at bedtime.        . Tamsulosin HCl (FLOMAX) 0.4 MG CAPS Take 0.4 mg by mouth daily.        Marland Kitchen zolpidem (AMBIEN) 10 MG tablet take 1 tablet by mouth at bedtime  30 tablet  5   No current facility-administered medications on file prior to visit.   Medical History:  Past Medical History  Diagnosis Date  . History of colonic polyps   . Diverticulitis, colon   . CAD (coronary artery disease)   . Adenocarcinoma of prostate     s/p seed implantation  . Hyperlipidemia   . HTN (hypertension)   . Radiation proctitis     with chronic bleeding  . Axillary abscess     left   Allergies:  Allergies  Allergen Reactions  . Viagra [Sildenafil Citrate]     ROS Constitutional: Denies fever, chills, headaches, insomnia, fatigue, night sweats Eyes: Denies redness, blurred vision, diplopia, discharge, itchy, watery eyes.  ENT: Denies congestion, post nasal drip, sore throat, earache, dental pain, Tinnitus, Vertigo, Sinus pain, snoring.  Cardio: Denies chest pain, palpitations,  irregular heartbeat, dyspnea, diaphoresis, orthopnea, PND, claudication, edema Respiratory: denies cough, shortness of breath, wheezing.  Gastrointestinal: Denies dysphagia, heartburn, AB pain/ cramps, N/V, diarrhea, constipation, hematemesis, melena, hematochezia,  hemorrhoids Genitourinary: Denies dysuria, frequency, urgency, nocturia, hesitancy, discharge, hematuria, flank pain Musculoskeletal: Denies myalgia, stiffness, pain, swelling and strain/sprain. Skin: Denies pruritis, rash, changing in skin lesion Neuro: Denies Weakness, tremor, incoordination, spasms, pain Psychiatric: Denies confusion, memory loss, sensory loss Endocrine: Denies change in weight, skin, hair change, nocturia Diabetic Polys, Denies visual blurring, hyper /hypo glycemic episodes, and paresthesia, Heme/Lymph: Denies Excessive bleeding, bruising, enlarged lymph nodes  Family history- Review and unchanged Social history- Review and  unchanged Physical Exam: Filed Vitals:   12/13/13 0927  BP: 120/70  Pulse: 56  Temp: 97.9 F (36.6 C)  Resp: 16   Filed Weights   12/13/13 0927  Weight: 196 lb (88.905 kg)   General Appearance: Well nourished, in no apparent distress. Eyes: PERRLA, EOMs, conjunctiva no swelling or erythema Sinuses: No Frontal/maxillary tenderness ENT/Mouth: Ext aud canals clear, TMs without erythema, bulging. No erythema, swelling, or exudate on post pharynx.  Tonsils not swollen or erythematous. Hearing normal.  Neck: Supple, thyroid normal.  Respiratory: Respiratory effort normal, BS equal bilaterally without rales, rhonchi, wheezing or stridor.  Cardio: RRR with no MRGs. Brisk peripheral pulses without edema.  Abdomen: Soft, + BS.  Non tender, no guarding, rebound, hernias, masses. Lymphatics: Non tender without lymphadenopathy.  Musculoskeletal: Full ROM, 5/5 strength, normal gait.  Skin: Warm, dry without rashes, lesions, ecchymosis.  Neuro: Cranial nerves intact. Normal muscle tone, no cerebellar symptoms. Sensation intact.  Psych: Awake and oriented X 3, normal affect, Insight and Judgment appropriate.   Assessment and Plan:  Hypertension: Continue medication, monitor blood pressure at home.  Continue DASH diet. Cholesterol: Continue diet and exercise. Check cholesterol. Goal is 70, if not at goal we will switch to lipitor or add zetia.  Diabetes-Continue diet and exercise. Check A1C. LONG discussion about DM and his CAD.  CKD- check GFR CAD- no CP.SOB.  Vitamin D Def- check level and continue medications.   Continue diet and meds as discussed. Further disposition pending results of labs.  Vicie Mutters 9:31 AM

## 2013-12-13 NOTE — Patient Instructions (Signed)
   Bad carbs also include fruit juice, alcohol, and sweet tea. These are empty calories that do not signal to your brain that you are full.   Please remember the good carbs are still carbs which convert into sugar. So please measure them out no more than 1/2-1 cup of rice, oatmeal, pasta, and beans.  Veggies are however free foods! Pile them on.   I like lean protein at every meal such as chicken, [place], pork chops, cottage cheese, etc. Just do not fry these meats and please center your meal around vegetable, the meats should be a side dish.   No all fruit is created equal. Please see the list below, the fruit at the bottom is higher in sugars than the fruit at the top   Your LDL is not in range. Your LDL is the bad cholesterol that can lead to heart attack and stroke. To lower your number you can decrease your fatty foods, red meat, cheese, milk and increase fiber like whole grains and veggies. You can also add a fiber supplement like Metamucil or Benefiber.

## 2013-12-14 ENCOUNTER — Other Ambulatory Visit: Payer: Self-pay

## 2013-12-17 NOTE — Progress Notes (Signed)
[facility] 12/17/13

## 2014-01-18 ENCOUNTER — Other Ambulatory Visit: Payer: Medicare Other

## 2014-01-18 DIAGNOSIS — D72819 Decreased white blood cell count, unspecified: Secondary | ICD-10-CM

## 2014-01-18 DIAGNOSIS — E876 Hypokalemia: Secondary | ICD-10-CM

## 2014-01-18 LAB — CBC WITH DIFFERENTIAL/PLATELET
Basophils Absolute: 0 10*3/uL (ref 0.0–0.1)
Basophils Relative: 1 % (ref 0–1)
EOS ABS: 0.1 10*3/uL (ref 0.0–0.7)
EOS PCT: 2 % (ref 0–5)
HEMATOCRIT: 44.7 % (ref 39.0–52.0)
Hemoglobin: 15 g/dL (ref 13.0–17.0)
LYMPHS ABS: 1.5 10*3/uL (ref 0.7–4.0)
Lymphocytes Relative: 42 % (ref 12–46)
MCH: 26.2 pg (ref 26.0–34.0)
MCHC: 33.6 g/dL (ref 30.0–36.0)
MCV: 78.1 fL (ref 78.0–100.0)
MONO ABS: 0.3 10*3/uL (ref 0.1–1.0)
Monocytes Relative: 8 % (ref 3–12)
Neutro Abs: 1.7 10*3/uL (ref 1.7–7.7)
Neutrophils Relative %: 47 % (ref 43–77)
PLATELETS: 167 10*3/uL (ref 150–400)
RBC: 5.72 MIL/uL (ref 4.22–5.81)
RDW: 16 % — ABNORMAL HIGH (ref 11.5–15.5)
WBC: 3.6 10*3/uL — ABNORMAL LOW (ref 4.0–10.5)

## 2014-01-18 LAB — BASIC METABOLIC PANEL WITH GFR
BUN: 14 mg/dL (ref 6–23)
CALCIUM: 9.8 mg/dL (ref 8.4–10.5)
CO2: 25 mEq/L (ref 19–32)
Chloride: 103 mEq/L (ref 96–112)
Creat: 1.12 mg/dL (ref 0.50–1.35)
GFR, Est African American: 75 mL/min
GFR, Est Non African American: 65 mL/min
Glucose, Bld: 158 mg/dL — ABNORMAL HIGH (ref 70–99)
Potassium: 3.4 mEq/L — ABNORMAL LOW (ref 3.5–5.3)
Sodium: 141 mEq/L (ref 135–145)

## 2014-01-18 LAB — MAGNESIUM: Magnesium: 1.9 mg/dL (ref 1.5–2.5)

## 2014-01-21 ENCOUNTER — Telehealth: Payer: Self-pay

## 2014-01-21 ENCOUNTER — Other Ambulatory Visit: Payer: Self-pay

## 2014-01-21 MED ORDER — POTASSIUM CHLORIDE CRYS ER 20 MEQ PO TBCR: 20.0000 meq | EXTENDED_RELEASE_TABLET | Freq: Two times a day (BID) | ORAL | Status: DC

## 2014-01-21 NOTE — Telephone Encounter (Signed)
Patient aware of lab results and instructions, patient aware new RX for Potassium Chloride 20 meq sent to pharmacy, he is aware of increase from 10 to 20 meq and will recheck at his April visit with Dr Melford Aase per Vicie Mutters, PA

## 2014-02-25 ENCOUNTER — Other Ambulatory Visit: Payer: Self-pay | Admitting: Physician Assistant

## 2014-03-17 ENCOUNTER — Encounter: Payer: Self-pay | Admitting: Internal Medicine

## 2014-03-17 DIAGNOSIS — Z79899 Other long term (current) drug therapy: Secondary | ICD-10-CM | POA: Insufficient documentation

## 2014-03-17 DIAGNOSIS — E119 Type 2 diabetes mellitus without complications: Secondary | ICD-10-CM | POA: Insufficient documentation

## 2014-03-17 NOTE — Progress Notes (Signed)
Patient ID: [name] Colon, male   DOB: 1941-08-29, 73 y.o.   MRN: [id number]    This very nice 73 y.o. 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    HTN predates since the 1990's. BP has been controlled at home. Today's BP: 126/74 mmHg . In 2006 pt had a Stent for ACS and in 2012 had a negative stress test. Patient denies any cardiac type chest pain, palpitations, dyspnea/orthopnea/PND, dizziness, claudication, or dependent edema.   Hyperlipidemia was not controlled with diet as he had been off of his meds at his October visit. Last Cholesterol was 193, Triglycerides were 84, HDL 44 and LDL 132 and patient agreed to restart his simvastatin. LDL did come down to 87 in Jan 2015 Patient denies myalgias or other med SE's.    Also, the patient has history of T2 NIDDM with Stage 2 CKD (GFR 75 ml/min)  And managed with diet since 2003 with last A1c was 6.2% in Jan 2015. Patient denies any symptoms of reactive hypoglycemia, diabetic polys, paresthesias or visual blurring.   Further, Patient has history of Vitamin D Deficiency of 60 in 2012 with last vitamin D of 36 in Oct 2014. Patient supplements vitamin D without any suspected side-effects.  Medication Sig  . amLODipine (NORVASC) 5 MG tablet Take 5 mg by mouth daily.  Marland Kitchen aspirin 81 MG tablet Take 81 mg by mouth daily.  Marland Kitchen atenolol (TENORMIN) 100 MG tablet take 1 tablet by mouth once daily for blood pressure  . citalopram (CELEXA) 40 MG tablet Take 20 mg by mouth daily.    Marland Kitchen losartan (COZAAR) 25 MG tablet Take 25 mg by mouth daily. Patient not sure of dosage  . nitroGLYCERIN (NITROSTAT) 0.4 MG SL tablet Place 0.4 mg under the tongue every 5 (five) minutes as needed.    . potassium chloride (K-DUR) 10 MEQ tablet Take 1 tablet (10 mEq total) by mouth 2 (two) times daily.  . potassium chloride SA (K-DUR,KLOR-CON) 20 MEQ tablet Take 1 tablet (20 mEq total) by mouth 2 (two)  times daily.  . propranolol (INDERAL) 10 MG tablet Take 10 mg by mouth as needed. For anxiety  . pyridOXINE (VITAMIN B-6) 50 MG tablet Take 50 mg by mouth daily.    . risperiDONE (RISPERDAL) 2 MG tablet Take 1 mg by mouth at bedtime.   . simvastatin (ZOCOR) 80 MG tablet Take 40 mg by mouth at bedtime.    . Tamsulosin HCl (FLOMAX) 0.4 MG CAPS Take 0.4 mg by mouth daily.    Marland Kitchen zolpidem (AMBIEN) 10 MG tablet take 1 tablet by mouth at bedtime   Allergies  Allergen Reactions  . Viagra [Sildenafil Citrate]    PMHx:   Past Medical History  Diagnosis Date  . History of colonic polyps   . Diverticulitis, colon   . CAD (coronary artery disease)   . Adenocarcinoma of prostate     s/p seed implantation  . Hyperlipidemia   . HTN (hypertension)   . Radiation proctitis     with chronic bleeding  . Axillary abscess     left   FHx:    Reviewed / unchanged  SHx:    Reviewed / unchanged   Systems Review: Constitutional: Denies fever, chills, wt changes, headaches, insomnia, fatigue, night sweats, change in appetite. Eyes: Denies redness, blurred vision, diplopia, discharge, itchy, watery eyes.  ENT: Denies discharge, congestion, post nasal drip, epistaxis, sore throat, earache,  hearing loss, dental pain, tinnitus, vertigo, sinus pain, snoring.  CV: Denies chest pain, palpitations, irregular heartbeat, syncope, dyspnea, diaphoresis, orthopnea, PND, claudication, edema. Respiratory: denies cough, dyspnea, DOE, pleurisy, hoarseness, laryngitis, wheezing.  Gastrointestinal: Denies dysphagia, odynophagia, heartburn, reflux, water brash, abdominal pain or cramps, nausea, vomiting, bloating, diarrhea, constipation, hematemesis, melena, hematochezia,  or hemorrhoids. Genitourinary: Denies dysuria, frequency, urgency, nocturia, hesitancy, discharge, hematuria, flank pain. Musculoskeletal: Denies arthralgias, myalgias, stiffness, jt. swelling, pain, limp, strain/sprain.  Skin: Denies pruritus, rash, hives,  warts, acne, eczema, change in skin lesion(s). Neuro: No weakness, tremor, incoordination, spasms, paresthesia, or pain. Psychiatric: Denies confusion, memory loss, or sensory loss. Endo: Denies change in weight, skin, hair change.  Heme/Lymph: No excessive bleeding, bruising, orenlarged lymph nodes.  Exam:  BP 126/74  Pulse 52  Temp 98.1 F   Resp 16  Ht 5\' 10"    Wt 195 lb 3.2 oz   BMI 28.01 kg/m2  Appears well nourished - in no distress. Eyes: PERRLA, EOMs, conjunctiva no swelling or erythema. Sinuses: No frontal/maxillary tenderness ENT/Mouth: EAC's clear, TM's nl w/o erythema, bulging. Nares clear w/o erythema, swelling, exudates. Oropharynx clear without erythema or exudates. Oral hygiene is good. Tongue normal, non obstructing. Hearing intact.  Neck: Supple. Thyroid nl. Car 2+/2+ without bruits, nodes or JVD. Chest: Respirations nl with BS clear & equal w/o rales, rhonchi, wheezing or stridor.  Cor: Heart sounds normal w/ regular rate and rhythm without sig. murmurs, gallops, clicks, or rubs. Peripheral pulses normal and equal  without edema.  Abdomen: Soft & bowel sounds normal. Non-tender w/o guarding, rebound, hernias, masses, or organomegaly.  Lymphatics: Unremarkable.  Musculoskeletal: Full ROM all peripheral extremities, joint stability, 5/5 strength, and normal gait.  Skin: Warm, dry without exposed rashes, lesions, ecchymosis apparent.  Neuro: Cranial nerves intact, reflexes equal bilaterally. Sensory-motor testing grossly intact. Tendon reflexes grossly intact.  Pysch: Alert & oriented x 3. Insight and judgement nl & appropriate. No ideations.  Assessment and Plan:  1. Hypertension - Continue monitor blood pressure at home. Continue diet/meds same.  2. Hyperlipidemia - Continue diet/meds, exercise,& lifestyle modifications. Continue monitor periodic cholesterol/liver & renal functions   3. T2 NIDDM w/Stage 2 CKD - continue recommend prudent low glycemic diet, weight  control, regular exercise, diabetic monitoring and periodic eye exams.  4. Vitamin D Deficiency - Continue supplementation.  5. ASCAD S/P Stent (2006)   Recommended regular exercise, BP monitoring, weight control, and discussed med and SE's. Recommended labs to assess and monitor clinical status. Further disposition pending results of labs.

## 2014-03-18 ENCOUNTER — Ambulatory Visit (INDEPENDENT_AMBULATORY_CARE_PROVIDER_SITE_OTHER): Payer: Medicare Other | Admitting: Internal Medicine

## 2014-03-18 VITALS — BP 126/74 | HR 52 | Temp 98.1°F | Resp 16 | Ht 70.0 in | Wt 195.2 lb

## 2014-03-18 DIAGNOSIS — Z79899 Other long term (current) drug therapy: Secondary | ICD-10-CM

## 2014-03-18 DIAGNOSIS — E559 Vitamin D deficiency, unspecified: Secondary | ICD-10-CM | POA: Insufficient documentation

## 2014-03-18 DIAGNOSIS — E782 Mixed hyperlipidemia: Secondary | ICD-10-CM

## 2014-03-18 DIAGNOSIS — I10 Essential (primary) hypertension: Secondary | ICD-10-CM

## 2014-03-18 DIAGNOSIS — E1129 Type 2 diabetes mellitus with other diabetic kidney complication: Secondary | ICD-10-CM

## 2014-03-18 LAB — MAGNESIUM: MAGNESIUM: 1.9 mg/dL (ref 1.5–2.5)

## 2014-03-18 LAB — CBC WITH DIFFERENTIAL/PLATELET
BASOS ABS: 0 10*3/uL (ref 0.0–0.1)
Basophils Relative: 1 % (ref 0–1)
EOS ABS: 0.1 10*3/uL (ref 0.0–0.7)
Eosinophils Relative: 2 % (ref 0–5)
HEMATOCRIT: 46.5 % (ref 39.0–52.0)
Hemoglobin: 15.5 g/dL (ref 13.0–17.0)
Lymphocytes Relative: 37 % (ref 12–46)
Lymphs Abs: 1.5 10*3/uL (ref 0.7–4.0)
MCH: 26.1 pg (ref 26.0–34.0)
MCHC: 33.3 g/dL (ref 30.0–36.0)
MCV: 78.2 fL (ref 78.0–100.0)
MONO ABS: 0.4 10*3/uL (ref 0.1–1.0)
Monocytes Relative: 9 % (ref 3–12)
Neutro Abs: 2 10*3/uL (ref 1.7–7.7)
Neutrophils Relative %: 51 % (ref 43–77)
PLATELETS: 165 10*3/uL (ref 150–400)
RBC: 5.95 MIL/uL — ABNORMAL HIGH (ref 4.22–5.81)
RDW: 15.9 % — ABNORMAL HIGH (ref 11.5–15.5)
WBC: 4 10*3/uL (ref 4.0–10.5)

## 2014-03-18 LAB — LIPID PANEL
Cholesterol: 156 mg/dL (ref 0–200)
HDL: 45 mg/dL (ref >39)
LDL CALC: 94 mg/dL (ref 0–99)
Total CHOL/HDL Ratio: 3.5 Ratio
Triglycerides: 83 mg/dL (ref <150)
VLDL: 17 mg/dL (ref 0–40)

## 2014-03-18 LAB — BASIC METABOLIC PANEL WITH GFR
BUN: 13 mg/dL (ref 6–23)
CO2: 28 meq/L (ref 19–32)
CREATININE: 1.11 mg/dL (ref 0.50–1.35)
Calcium: 10.1 mg/dL (ref 8.4–10.5)
Chloride: 99 mEq/L (ref 96–112)
GFR, EST NON AFRICAN AMERICAN: 66 mL/min
GFR, Est African American: 76 mL/min
Glucose, Bld: 132 mg/dL — ABNORMAL HIGH (ref 70–99)
Potassium: 3.7 mEq/L (ref 3.5–5.3)
Sodium: 138 mEq/L (ref 135–145)

## 2014-03-18 LAB — HEPATIC FUNCTION PANEL
ALBUMIN: 4.6 g/dL (ref 3.5–5.2)
ALK PHOS: 41 U/L (ref 39–117)
ALT: 25 U/L (ref 0–53)
AST: 25 U/L (ref 0–37)
Bilirubin, Direct: 0.2 mg/dL (ref 0.0–0.3)
Indirect Bilirubin: 0.7 mg/dL (ref 0.2–1.2)
TOTAL PROTEIN: 7.4 g/dL (ref 6.0–8.3)
Total Bilirubin: 0.9 mg/dL (ref 0.2–1.2)

## 2014-03-18 LAB — HEMOGLOBIN A1C
HEMOGLOBIN A1C: 6.2 % — AB (ref <5.7)
MEAN PLASMA GLUCOSE: 131 mg/dL — AB (ref <117)

## 2014-03-18 LAB — VITAMIN D 25 HYDROXY (VIT D DEFICIENCY, FRACTURES): VIT D 25 HYDROXY: 34 ng/mL (ref 30–89)

## 2014-03-18 LAB — TSH: TSH: 2.874 u[IU]/mL (ref 0.350–4.500)

## 2014-03-18 NOTE — Patient Instructions (Signed)

## 2014-03-19 LAB — INSULIN, FASTING: Insulin fasting, serum: 95 u[IU]/mL — ABNORMAL HIGH (ref 3–28)

## 2014-03-22 ENCOUNTER — Other Ambulatory Visit: Payer: Self-pay | Admitting: Physician Assistant

## 2014-05-21 ENCOUNTER — Other Ambulatory Visit: Payer: Self-pay | Admitting: Emergency Medicine

## 2014-05-21 ENCOUNTER — Other Ambulatory Visit: Payer: Self-pay | Admitting: Physician Assistant

## 2014-05-21 ENCOUNTER — Encounter: Payer: Self-pay | Admitting: Cardiology

## 2014-05-21 MED ORDER — ATENOLOL 100 MG PO TABS: ORAL_TABLET | ORAL | Status: DC

## 2014-06-17 ENCOUNTER — Ambulatory Visit: Payer: Self-pay | Admitting: Emergency Medicine

## 2014-06-20 ENCOUNTER — Ambulatory Visit: Payer: Self-pay | Admitting: Emergency Medicine

## 2014-07-03 ENCOUNTER — Ambulatory Visit (INDEPENDENT_AMBULATORY_CARE_PROVIDER_SITE_OTHER): Payer: Medicare Other | Admitting: Physician Assistant

## 2014-07-03 ENCOUNTER — Encounter: Payer: Self-pay | Admitting: Physician Assistant

## 2014-07-03 VITALS — BP 110/70 | HR 60 | Temp 97.9°F | Resp 16 | Ht 70.0 in | Wt 195.0 lb

## 2014-07-03 DIAGNOSIS — I10 Essential (primary) hypertension: Secondary | ICD-10-CM

## 2014-07-03 DIAGNOSIS — E782 Mixed hyperlipidemia: Secondary | ICD-10-CM

## 2014-07-03 DIAGNOSIS — E559 Vitamin D deficiency, unspecified: Secondary | ICD-10-CM

## 2014-07-03 DIAGNOSIS — Z79899 Other long term (current) drug therapy: Secondary | ICD-10-CM

## 2014-07-03 DIAGNOSIS — Z7901 Long term (current) use of anticoagulants: Secondary | ICD-10-CM

## 2014-07-03 DIAGNOSIS — E1129 Type 2 diabetes mellitus with other diabetic kidney complication: Secondary | ICD-10-CM

## 2014-07-03 LAB — CBC WITH DIFFERENTIAL/PLATELET
BASOS ABS: 0 10*3/uL (ref 0.0–0.1)
BASOS PCT: 1 % (ref 0–1)
EOS ABS: 0.1 10*3/uL (ref 0.0–0.7)
EOS PCT: 3 % (ref 0–5)
HEMATOCRIT: 45.2 % (ref 39.0–52.0)
Hemoglobin: 14.9 g/dL (ref 13.0–17.0)
Lymphocytes Relative: 38 % (ref 12–46)
Lymphs Abs: 1.6 10*3/uL (ref 0.7–4.0)
MCH: 25.9 pg — ABNORMAL LOW (ref 26.0–34.0)
MCHC: 33 g/dL (ref 30.0–36.0)
MCV: 78.5 fL (ref 78.0–100.0)
MONO ABS: 0.3 10*3/uL (ref 0.1–1.0)
Monocytes Relative: 8 % (ref 3–12)
Neutro Abs: 2.1 10*3/uL (ref 1.7–7.7)
Neutrophils Relative %: 50 % (ref 43–77)
Platelets: 170 10*3/uL (ref 150–400)
RBC: 5.76 MIL/uL (ref 4.22–5.81)
RDW: 15.8 % — AB (ref 11.5–15.5)
WBC: 4.1 10*3/uL (ref 4.0–10.5)

## 2014-07-03 LAB — BASIC METABOLIC PANEL WITH GFR
BUN: 15 mg/dL (ref 6–23)
CALCIUM: 10.2 mg/dL (ref 8.4–10.5)
CO2: 29 mEq/L (ref 19–32)
CREATININE: 1.23 mg/dL (ref 0.50–1.35)
Chloride: 104 mEq/L (ref 96–112)
GFR, EST AFRICAN AMERICAN: 67 mL/min
GFR, EST NON AFRICAN AMERICAN: 58 mL/min — AB
Glucose, Bld: 168 mg/dL — ABNORMAL HIGH (ref 70–99)
Potassium: 3.7 mEq/L (ref 3.5–5.3)
Sodium: 142 mEq/L (ref 135–145)

## 2014-07-03 LAB — LIPID PANEL
Cholesterol: 143 mg/dL (ref 0–200)
HDL: 41 mg/dL (ref >39)
LDL CALC: 81 mg/dL (ref 0–99)
Total CHOL/HDL Ratio: 3.5 Ratio
Triglycerides: 104 mg/dL (ref <150)
VLDL: 21 mg/dL (ref 0–40)

## 2014-07-03 LAB — HEPATIC FUNCTION PANEL
ALBUMIN: 4.5 g/dL (ref 3.5–5.2)
ALT: 20 U/L (ref 0–53)
AST: 21 U/L (ref 0–37)
Alkaline Phosphatase: 44 U/L (ref 39–117)
BILIRUBIN TOTAL: 0.5 mg/dL (ref 0.2–1.2)
Bilirubin, Direct: 0.2 mg/dL (ref 0.0–0.3)
Indirect Bilirubin: 0.3 mg/dL (ref 0.2–1.2)
TOTAL PROTEIN: 7.3 g/dL (ref 6.0–8.3)

## 2014-07-03 LAB — HEMOGLOBIN A1C
Hgb A1c MFr Bld: 6.2 % — ABNORMAL HIGH (ref <5.7)
MEAN PLASMA GLUCOSE: 131 mg/dL — AB (ref <117)

## 2014-07-03 LAB — MAGNESIUM: Magnesium: 2.1 mg/dL (ref 1.5–2.5)

## 2014-07-03 LAB — TSH: TSH: 2.024 u[IU]/mL (ref 0.350–4.500)

## 2014-07-03 NOTE — Progress Notes (Signed)
Assessment and Plan:  Hypertension: Continue medication, monitor blood pressure at home. Continue DASH diet. Cholesterol: Continue diet and exercise. Check cholesterol.  Diabetes-Continue diet and exercise. Check A1C Vitamin D Def- check level and continue medications.   Continue diet and meds as discussed. Further disposition pending results of labs. Discussed med's effects and SE's.    HPI 73 y.o. male  presents for 3 month follow up with hypertension, hyperlipidemia, diabetes and vitamin D. His blood pressure has been controlled at home, today their BP is BP: 110/70 mmHg He does workout, he walks up and down the stairs to his "man cave" which is upstairs. He denies chest pain, shortness of breath, dizziness.  He is on cholesterol medication and denies myalgias. His cholesterol is at goal. The cholesterol last visit was:   Lab Results  Component Value Date   CHOL 156 03/18/2014   HDL 45 03/18/2014   LDLCALC 94 03/18/2014   TRIG 83 03/18/2014   CHOLHDL 3.5 03/18/2014   He has been working on diet and exercise for Diabetes, and denies paresthesia of the feet, polydipsia and polyuria. Last A1C in the office was:  Lab Results  Component Value Date   HGBA1C 6.2* 03/18/2014   Patient is on Vitamin D supplement. Lab Results  Component Value Date   VD25OH 34 03/18/2014     He takes ambien at night to help him sleep, denies AE's.  Current Medications:  Current Outpatient Prescriptions on File Prior to Visit  Medication Sig Dispense Refill  . amLODipine (NORVASC) 5 MG tablet Take 5 mg by mouth daily.      Marland Kitchen aspirin 81 MG tablet Take 81 mg by mouth daily.      Marland Kitchen atenolol (TENORMIN) 100 MG tablet take 1 tablet by mouth once daily for blood pressure  90 tablet  1  . citalopram (CELEXA) 40 MG tablet Take 20 mg by mouth daily.        Marland Kitchen losartan (COZAAR) 25 MG tablet Take 25 mg by mouth daily. Patient not sure of dosage      . losartan-hydrochlorothiazide (HYZAAR) 100-25 MG per tablet take 1  tablet by mouth once daily  30 tablet  5  . nitroGLYCERIN (NITROSTAT) 0.4 MG SL tablet Place 0.4 mg under the tongue every 5 (five) minutes as needed.        . potassium chloride (K-DUR) 10 MEQ tablet Take 1 tablet (10 mEq total) by mouth 2 (two) times daily.  60 tablet  3  . potassium chloride SA (K-DUR,KLOR-CON) 20 MEQ tablet take 1 tablet by mouth twice a day  60 tablet  3  . propranolol (INDERAL) 10 MG tablet Take 10 mg by mouth as needed. For anxiety      . pyridOXINE (VITAMIN B-6) 50 MG tablet Take 50 mg by mouth daily.        . risperiDONE (RISPERDAL) 2 MG tablet Take 1 mg by mouth at bedtime.       . simvastatin (ZOCOR) 80 MG tablet Take 40 mg by mouth at bedtime.        . Tamsulosin HCl (FLOMAX) 0.4 MG CAPS Take 0.4 mg by mouth daily.        Marland Kitchen zolpidem (AMBIEN) 10 MG tablet take 1 tablet by mouth at bedtime  30 tablet  4   No current facility-administered medications on file prior to visit.   Medical History:  Past Medical History  Diagnosis Date  . History of colonic polyps   . Diverticulitis, colon   .  CAD (coronary artery disease)   . Adenocarcinoma of prostate     s/p seed implantation  . Hyperlipidemia   . HTN (hypertension)   . Radiation proctitis     with chronic bleeding  . Axillary abscess     left   Allergies:  Allergies  Allergen Reactions  . Viagra [Sildenafil Citrate]      Review of Systems: [X]  = complains of  [ ]  = denies  General: Fatigue [ ]  Fever [ ]  Chills [ ]  Weakness [ ]   Insomnia [ ]  Eyes: Redness [ ]  Blurred vision [ ]  Diplopia [ ]   ENT: Congestion [ ]  Sinus Pain [ ]  Post Nasal Drip [ ]  Sore Throat [ ]  Earache [ ]   Cardiac: Chest pain/pressure [ ]  SOB [ ]  Orthopnea [ ]   Palpitations [ ]   Paroxysmal nocturnal dyspnea[ ]  Claudication [ ]  Edema [ ]   Pulmonary: Cough [ ]  Wheezing[ ]   SOB [ ]   Snoring [ ]   GI: Nausea [ ]  Vomiting[ ]  Dysphagia[ ]  Heartburn[ ]  Abdominal pain [ ]  Constipation [ ] ; Diarrhea [ ] ; BRBPR [ ]  Melena[ ]  GU: Hematuria[ ]   Dysuria [ ]  Nocturia[ ]  Urgency [ ]   Hesitancy [ ]  Discharge [ ]  Neuro: Headaches[ ]  Vertigo[ ]  Paresthesias[ ]  Spasm [ ]  Speech changes [ ]  Incoordination [ ]   Ortho: Arthritis [ ]  Joint pain [ ]  Muscle pain [ ]  Joint swelling [ ]  Back Pain [ ]  Skin:  Rash [ ]   Pruritis [ ]  Change in skin lesion [ ]   Psych: Depression[ ]  Anxiety[ ]  Confusion [ ]  Memory loss [ ]   Heme/Lypmh: Bleeding [ ]  Bruising [ ]  Enlarged lymph nodes [ ]   Endocrine: Visual blurring [ ]  Paresthesia [ ]  Polyuria [ ]  Polydypsea [ ]    Heat/cold intolerance [ ]  Hypoglycemia [ ]   Family history- Review and unchanged Social history- Review and unchanged Physical Exam: BP 110/70  Pulse 60  Temp(Src) 97.9 F (36.6 C)  Resp 16  Ht 5\' 10"  (1.778 m)  Wt 195 lb (88.451 kg)  BMI 27.98 kg/m2 Wt Readings from Last 3 Encounters:  07/03/14 195 lb (88.451 kg)  03/18/14 195 lb 3.2 oz (88.542 kg)  12/13/13 196 lb (88.905 kg)   General Appearance: Well nourished, in no apparent distress. Eyes: PERRLA, EOMs, conjunctiva no swelling or erythema Sinuses: No Frontal/maxillary tenderness ENT/Mouth: Ext aud canals clear, TMs without erythema, bulging. No erythema, swelling, or exudate on post pharynx.  Tonsils not swollen or erythematous. Hearing normal.  Neck: Supple, thyroid normal.  Respiratory: Respiratory effort normal, BS equal bilaterally without rales, rhonchi, wheezing or stridor.  Cardio: RRR with no MRGs. Brisk peripheral pulses without edema.  Abdomen: Soft, + BS.  Non tender, no guarding, rebound, hernias, masses. Lymphatics: Non tender without lymphadenopathy.  Musculoskeletal: Full ROM, 5/5 strength, normal gait.  Skin: Warm, dry without rashes, lesions, ecchymosis.  Neuro: Cranial nerves intact. No cerebellar symptoms. Sensation intact.  Psych: Awake and oriented X 3, normal affect, Insight and Judgment appropriate.    Vicie Mutters 9:51 AM

## 2014-07-04 LAB — INSULIN, FASTING: Insulin fasting, serum: 196 u[IU]/mL — ABNORMAL HIGH (ref 3–28)

## 2014-07-11 ENCOUNTER — Encounter: Payer: Self-pay | Admitting: Internal Medicine

## 2014-07-22 ENCOUNTER — Encounter: Payer: Self-pay | Admitting: Internal Medicine

## 2014-07-23 ENCOUNTER — Other Ambulatory Visit: Payer: Self-pay | Admitting: Physician Assistant

## 2014-09-03 ENCOUNTER — Ambulatory Visit (INDEPENDENT_AMBULATORY_CARE_PROVIDER_SITE_OTHER): Payer: Medicare Other | Admitting: Cardiology

## 2014-09-03 ENCOUNTER — Encounter: Payer: Self-pay | Admitting: Cardiology

## 2014-09-03 VITALS — BP 157/83 | HR 52 | Ht 69.0 in | Wt 196.0 lb

## 2014-09-03 DIAGNOSIS — E782 Mixed hyperlipidemia: Secondary | ICD-10-CM

## 2014-09-03 DIAGNOSIS — I2510 Atherosclerotic heart disease of native coronary artery without angina pectoris: Secondary | ICD-10-CM

## 2014-09-03 DIAGNOSIS — I10 Essential (primary) hypertension: Secondary | ICD-10-CM

## 2014-09-03 NOTE — Progress Notes (Signed)
HPI The patient presents for follow up of his CAD.  Since I last saw him he has done well.  He is limited by knee pain.  However, with routine activities he has no new complints.  The patient denies any new symptoms such as chest discomfort, neck or arm discomfort. There has been no new shortness of breath, PND or orthopnea. There have been no reported palpitations, presyncope or syncope.  He has had none of the symptoms that was his previous angina.   Allergies  Allergen Reactions  . Viagra [Sildenafil Citrate]     Current Outpatient Prescriptions  Medication Sig Dispense Refill  . amLODipine (NORVASC) 5 MG tablet Take 5 mg by mouth daily.      Marland Kitchen aspirin 81 MG tablet Take 81 mg by mouth daily.      Marland Kitchen atenolol (TENORMIN) 100 MG tablet take 1 tablet by mouth once daily for blood pressure  90 tablet  1  . citalopram (CELEXA) 40 MG tablet Take 20 mg by mouth daily.        Marland Kitchen losartan (COZAAR) 25 MG tablet Take 25 mg by mouth daily. Patient not sure of dosage      . losartan-hydrochlorothiazide (HYZAAR) 100-25 MG per tablet take 1 tablet by mouth once daily  30 tablet  5  . nitroGLYCERIN (NITROSTAT) 0.4 MG SL tablet Place 0.4 mg under the tongue every 5 (five) minutes as needed.        . potassium chloride (K-DUR) 10 MEQ tablet Take 1 tablet (10 mEq total) by mouth 2 (two) times daily.  60 tablet  3  . potassium chloride SA (K-DUR,KLOR-CON) 20 MEQ tablet take 1 tablet by mouth twice a day  60 tablet  3  . propranolol (INDERAL) 10 MG tablet Take 10 mg by mouth as needed. For anxiety      . pyridOXINE (VITAMIN B-6) 50 MG tablet Take 50 mg by mouth daily.        . risperiDONE (RISPERDAL) 2 MG tablet Take 1 mg by mouth at bedtime.       . simvastatin (ZOCOR) 80 MG tablet Take 40 mg by mouth at bedtime.        . Tamsulosin HCl (FLOMAX) 0.4 MG CAPS Take 0.4 mg by mouth daily.        Marland Kitchen zolpidem (AMBIEN) 10 MG tablet take 1 tablet by mouth at bedtime  30 tablet  4   No current facility-administered  medications for this visit.    Past Medical History  Diagnosis Date  . History of colonic polyps   . Diverticulitis, colon   . CAD (coronary artery disease)   . Adenocarcinoma of prostate     s/p seed implantation  . Hyperlipidemia   . HTN (hypertension)   . Radiation proctitis     with chronic bleeding  . Axillary abscess     left    Past Surgical History  Procedure Laterality Date  . Coronary angioplasty with stent placement  04/2008   ROS:  As stated in the HPI and negative for all other systems.  PHYSICAL EXAM BP 157/83  Pulse 52  Ht 5\' 9"  (1.753 m)  Wt 196 lb (88.905 kg)  BMI 28.93 kg/m2 GENERAL:  Well appearing NECK:  No jugular venous distention, waveform within normal limits, carotid upstroke brisk and symmetric, no bruits, no thyromegaly LUNGS:  Clear to auscultation bilaterally CHEST:  Unremarkable HEART:  PMI not displaced or sustained,S1 and S2 within normal limits, no S3, no  S4, no clicks, no rubs, no murmurs ABD:  Flat, positive bowel sounds normal in frequency in pitch, no bruits, no rebound, no guarding, no midline pulsatile mass, no hepatomegaly, no splenomegaly EXT:  2 plus pulses throughout, trace edema, no cyanosis no clubbing   EKG:  Sinus rhythm, rate 52, axis within normal limits, intervals within normal limits, no acute ST-T wave changes.  09/03/2014   ASSESSMENT AND PLAN  CAD -  He has had no new symptoms since a stress test in 2012.  Marland Kitchen He will continue with risk reduction. No further cardiovascular testing is indicated.  We did discuss exercises he might do with a sore knee.  HYPERLIPIDEMIA -  I have seen recent lipids.  This is followed by Alesia Richards, MD.  I will defer to his management  Lab Results  Component Value Date   CHOL 143 07/03/2014   TRIG 104 07/03/2014   HDL 41 07/03/2014   LDLCALC 81 07/03/2014     HYPERTENSION -  His blood pressure is slightly elevated.  However this is unusual.  His readings at home are SBP  120 - 140.  He will continue the current therapy.

## 2014-09-03 NOTE — Patient Instructions (Signed)
Your physician recommends that you schedule a follow-up appointment in: one year with Dr. Hochrein  

## 2014-09-05 ENCOUNTER — Ambulatory Visit (AMBULATORY_SURGERY_CENTER): Payer: Self-pay | Admitting: *Deleted

## 2014-09-05 VITALS — Ht 69.0 in | Wt 197.6 lb

## 2014-09-05 DIAGNOSIS — Z86010 Personal history of colonic polyps: Secondary | ICD-10-CM

## 2014-09-05 MED ORDER — MOVIPREP 100 G PO SOLR: 1.0000 | Freq: Once | ORAL | Status: DC

## 2014-09-05 NOTE — Progress Notes (Signed)
No egg or soy allergy. No anesthesia problems.  No home O2.  No diet meds.  

## 2014-09-17 ENCOUNTER — Encounter: Payer: Self-pay | Admitting: Internal Medicine

## 2014-09-17 ENCOUNTER — Ambulatory Visit (INDEPENDENT_AMBULATORY_CARE_PROVIDER_SITE_OTHER): Payer: Medicare Other | Admitting: Internal Medicine

## 2014-09-17 VITALS — BP 126/80 | HR 60 | Temp 97.6°F | Resp 16 | Ht 70.0 in | Wt 197.0 lb

## 2014-09-17 DIAGNOSIS — Z1331 Encounter for screening for depression: Secondary | ICD-10-CM

## 2014-09-17 DIAGNOSIS — E559 Vitamin D deficiency, unspecified: Secondary | ICD-10-CM

## 2014-09-17 DIAGNOSIS — I10 Essential (primary) hypertension: Secondary | ICD-10-CM

## 2014-09-17 DIAGNOSIS — E782 Mixed hyperlipidemia: Secondary | ICD-10-CM

## 2014-09-17 DIAGNOSIS — I2510 Atherosclerotic heart disease of native coronary artery without angina pectoris: Secondary | ICD-10-CM

## 2014-09-17 DIAGNOSIS — Z1212 Encounter for screening for malignant neoplasm of rectum: Secondary | ICD-10-CM

## 2014-09-17 DIAGNOSIS — R6889 Other general symptoms and signs: Secondary | ICD-10-CM

## 2014-09-17 DIAGNOSIS — Z0001 Encounter for general adult medical examination with abnormal findings: Secondary | ICD-10-CM

## 2014-09-17 DIAGNOSIS — Z79899 Other long term (current) drug therapy: Secondary | ICD-10-CM

## 2014-09-17 DIAGNOSIS — Z9181 History of falling: Secondary | ICD-10-CM

## 2014-09-17 DIAGNOSIS — E1129 Type 2 diabetes mellitus with other diabetic kidney complication: Secondary | ICD-10-CM

## 2014-09-17 DIAGNOSIS — Z125 Encounter for screening for malignant neoplasm of prostate: Secondary | ICD-10-CM

## 2014-09-17 LAB — CBC WITH DIFFERENTIAL/PLATELET
BASOS ABS: 0 10*3/uL (ref 0.0–0.1)
BASOS PCT: 1 % (ref 0–1)
Eosinophils Absolute: 0.1 10*3/uL (ref 0.0–0.7)
Eosinophils Relative: 4 % (ref 0–5)
HEMATOCRIT: 47 % (ref 39.0–52.0)
Hemoglobin: 15.5 g/dL (ref 13.0–17.0)
LYMPHS PCT: 46 % (ref 12–46)
Lymphs Abs: 1.6 10*3/uL (ref 0.7–4.0)
MCH: 26.5 pg (ref 26.0–34.0)
MCHC: 33 g/dL (ref 30.0–36.0)
MCV: 80.3 fL (ref 78.0–100.0)
MONO ABS: 0.2 10*3/uL (ref 0.1–1.0)
Monocytes Relative: 6 % (ref 3–12)
Neutro Abs: 1.5 10*3/uL — ABNORMAL LOW (ref 1.7–7.7)
Neutrophils Relative %: 43 % (ref 43–77)
PLATELETS: 167 10*3/uL (ref 150–400)
RBC: 5.85 MIL/uL — ABNORMAL HIGH (ref 4.22–5.81)
RDW: 15.7 % — AB (ref 11.5–15.5)
WBC: 3.5 10*3/uL — AB (ref 4.0–10.5)

## 2014-09-17 NOTE — Patient Instructions (Signed)
 Recommend the book "The END of DIETING" by Dr Joel Furman   and the book "The END of DIABETES " by Dr Joel Fuhrman  At [employer] - get book & Audio CD's      Being diabetic has a  300% increased risk for heart attack, stroke, cancer, and alzheimer- type vascular dementia. It is very important that you work harder with diet by avoiding all foods that are white except chicken & fish. Avoid white rice (brown & wild rice is OK), white potatoes (sweetpotatoes in moderation is OK), White bread or wheat bread or anything made out of white flour like bagels, donuts, rolls, buns, biscuits, cakes, pastries, cookies, pizza crust, and pasta (made from white flour & egg whites) - vegetarian pasta or spinach or wheat pasta is OK. Multigrain breads like Arnold's or [employer], or multigrain sandwich thins or flatbreads.  Diet, exercise and weight loss can reverse and cure diabetes in the early stages.  Diet, exercise and weight loss is very important in the control and prevention of complications of diabetes which affects every system in your body, ie. Brain - dementia/stroke, eyes - glaucoma/blindness, heart - heart attack/heart failure, kidneys - dialysis, stomach - gastric paralysis, intestines - malabsorption, nerves - severe painful neuritis, circulation - gangrene & loss of a leg(s), and finally cancer and Alzheimers.    I recommend avoid fried & greasy foods,  sweets/candy, white rice (brown or wild rice or Quinoa is OK), white potatoes (sweet potatoes are OK) - anything made from white flour - bagels, doughnuts, rolls, buns, biscuits,white and wheat breads, pizza crust and traditional pasta made of white flour & egg white(vegetarian pasta or spinach or wheat pasta is OK).  Multi-grain bread is OK - like multi-grain flat bread or sandwich thins. Avoid alcohol in excess. Exercise is also important.    Eat all the vegetables you want - avoid meat, especially red meat and dairy - especially cheese.  Cheese  is the most concentrated form of trans-fats which is the worst thing to clog up our arteries. Veggie cheese is OK which can be found in the fresh produce section at [employer] or [employer] or [employer]  Preventive Care for Adults A healthy lifestyle and preventive care can promote health and wellness. Preventive health guidelines for men include the following key practices:  A routine yearly physical is a good way to check with your health care provider about your health and preventative screening. It is a chance to share any concerns and updates on your health and to receive a thorough exam.  Visit your dentist for a routine exam and preventative care every 6 months. Brush your teeth twice a day and floss once a day. Good oral hygiene prevents tooth decay and gum disease.  The frequency of eye exams is based on your age, health, family medical history, use of contact lenses, and other factors. Follow your health care provider's recommendations for frequency of eye exams.  Eat a healthy diet. Foods such as vegetables, fruits, whole grains, low-fat dairy products, and lean protein foods contain the nutrients you need without too many calories. Decrease your intake of foods high in solid fats, added sugars, and salt. Eat the right amount of calories for you.Get information about a proper diet from your health care provider, if necessary.  Regular physical exercise is one of the most important things you can do for your health. Most adults should get at least 150 minutes of moderate-intensity exercise (any activity that   that increases your heart rate and causes you to sweat) each week. In addition, most adults need muscle-strengthening exercises on 2 or more days a week.  Maintain a healthy weight. The body mass index (BMI) is a screening tool to identify possible weight problems. It provides an estimate of body fat based on height and weight. Your health care provider can find your BMI and can help  you achieve or maintain a healthy weight.For adults 20 years and older:  A BMI below 18.5 is considered underweight.  A BMI of 18.5 to 24.9 is normal.  A BMI of 25 to 29.9 is considered overweight.  A BMI of 30 and above is considered obese.  Maintain normal blood lipids and cholesterol levels by exercising and minimizing your intake of saturated fat. Eat a balanced diet with plenty of fruit and vegetables. Blood tests for lipids and cholesterol should begin at age 39 and be repeated every 5 years. If your lipid or cholesterol levels are high, you are over 50, or you are at high risk for heart disease, you may need your cholesterol levels checked more frequently.Ongoing high lipid and cholesterol levels should be treated with medicines if diet and exercise are not working.  If you smoke, find out from your health care provider how to quit. If you do not use tobacco, do not start.  Lung cancer screening is recommended for adults aged 13-80 years who are at high risk for developing lung cancer because of a history of smoking. A yearly low-dose CT scan of the lungs is recommended for people who have at least a 30-pack-year history of smoking and are a current smoker or have quit within the past 15 years. A pack year of smoking is smoking an average of 1 pack of cigarettes a day for 1 year (for example: 1 pack a day for 30 years or 2 packs a day for 15 years). Yearly screening should continue until the smoker has stopped smoking for at least 15 years. Yearly screening should be stopped for people who develop a health problem that would prevent them from having lung cancer treatment.  If you choose to drink alcohol, do not have more than 2 drinks per day. One drink is considered to be 12 ounces (355 mL) of beer, 5 ounces (148 mL) of wine, or 1.5 ounces (44 mL) of liquor.  Avoid use of street drugs. Do not share needles with anyone. Ask for help if you need support or instructions about stopping the  use of drugs.  High blood pressure causes heart disease and increases the risk of stroke. Your blood pressure should be checked at least every 1-2 years. Ongoing high blood pressure should be treated with medicines, if weight loss and exercise are not effective.  If you are 14-52 years old, ask your health care provider if you should take aspirin to prevent heart disease.  Diabetes screening involves taking a blood sample to check your fasting blood sugar level. Testing should be considered at a younger age or be carried out more frequently if you are overweight and have at least 1 risk factor for diabetes.  Colorectal cancer can be detected and often prevented. Most routine colorectal cancer screening begins at the age of 19 and continues through age 71. However, your health care provider may recommend screening at an earlier age if you have risk factors for colon cancer. On a yearly basis, your health care provider may provide home test kits to check for hidden blood  in the stool. Use of a small camera at the end of a tube to directly examine the colon (sigmoidoscopy or colonoscopy) can detect the earliest forms of colorectal cancer. Talk to your health care provider about this at age 22, when routine screening begins. Direct exam of the colon should be repeated every 5-10 years through age 54, unless early forms of precancerous polyps or small growths are found.  Hepatitis C blood testing is recommended for all people born from [birth year] through 1965 and any individual with known risks for hepatitis C.  Screening for abdominal aortic aneurysm (AAA)  by ultrasound is recommended for men who have history of high blood pressure or who are current or former smokers.  Healthy men should  receive prostate-specific antigen (PSA) blood tests as part of routine cancer screening. Talk with your health care provider about prostate cancer screening.  Testicular cancer screening is  recommended for adult males.  Screening includes self-exam, a health care provider exam, and other screening tests. Consult with your health care provider about any symptoms you have or any concerns you have about testicular cancer.  Use sunscreen. Apply sunscreen liberally and repeatedly throughout the day. You should seek shade when your shadow is shorter than you. Protect yourself by wearing long sleeves, pants, a wide-brimmed hat, and sunglasses year round, whenever you are outdoors.  Once a month, do a whole-body skin exam, using a mirror to look at the skin on your back. Tell your health care provider about new moles, moles that have irregular borders, moles that are larger than a pencil eraser, or moles that have changed in shape or color.  Stay current with required vaccines (immunizations).  Influenza vaccine. All adults should be immunized every year.  Tetanus, diphtheria, and acellular pertussis (Td, Tdap) vaccine. An adult who has not previously received Tdap or who does not know his vaccine status should receive 1 dose of Tdap. This initial dose should be followed by tetanus and diphtheria toxoids (Td) booster doses every 10 years. Adults with an unknown or incomplete history of completing a 3-dose immunization series with Td-containing vaccines should begin or complete a primary immunization series including a Tdap dose. Adults should receive a Td booster every 10 years.  Zoster vaccine. One dose is recommended for adults aged 42 years or older unless certain conditions are present.    PREVNAR - Pneumococcal 13-valent conjugate (PCV13) vaccine. When indicated, a person who is uncertain of his immunization history and has no record of immunization should receive the PCV13 vaccine. An adult aged 59 years or older who has certain medical conditions and has not been previously immunized should receive 1 dose of PCV13 vaccine. This PCV13 should be followed with a dose of pneumococcal polysaccharide (PPSV23) vaccine. The  PPSV23 vaccine dose should be obtained at least 8 weeks after the dose of PCV13 vaccine. An adult aged 45 years or older who has certain medical conditions and previously received 1 or more doses of PPSV23 vaccine should receive 1 dose of PCV13. The PCV13 vaccine dose should be obtained 1 or more years after the last PPSV23 vaccine dose.    PNEUMOVAX - Pneumococcal polysaccharide (PPSV23) vaccine. When PCV13 is also indicated, PCV13 should be obtained first. All adults aged 61 years and older should be immunized. An adult younger than age 20 years who has certain medical conditions should be immunized. Any person who resides in a nursing home or long-term care facility should be immunized. An adult smoker should be immunized.  People with an immunocompromised condition and certain other conditions should receive both PCV13 and PPSV23 vaccines. People with human immunodeficiency virus (HIV) infection should be immunized as soon as possible after diagnosis. Immunization during chemotherapy or radiation therapy should be avoided. Routine use of PPSV23 vaccine is not recommended for American Indians, [place] Natives, or people younger than 65 years unless there are medical conditions that require PPSV23 vaccine. When indicated, people who have unknown immunization and have no record of immunization should receive PPSV23 vaccine. One-time revaccination 5 years after the first dose of PPSV23 is recommended for people aged 19-64 years who have chronic kidney failure, nephrotic syndrome, asplenia, or immunocompromised conditions. People who received 1-2 doses of PPSV23 before age 7 years should receive another dose of PPSV23 vaccine at age 54 years or later if at least 5 years have passed since the previous dose. Doses of PPSV23 are not needed for people immunized with PPSV23 at or after age 43 years.    Hepatitis A vaccine. Adults who wish to be protected from this disease, have certain high-risk conditions, work  with hepatitis A-infected animals, work in hepatitis A research labs, or travel to or work in countries with a high rate of hepatitis A should be immunized. Adults who were previously unvaccinated and who anticipate close contact with an international adoptee during the first 60 days after arrival in the [place] from a country with a high rate of hepatitis A should be immunized.    Hepatitis B vaccine. Adults should be immunized if they wish to be protected from this disease, have certain high-risk conditions, may be exposed to blood or other infectious body fluids, are household contacts or sex partners of hepatitis B positive people, are clients or workers in certain care facilities, or travel to or work in countries with a high rate of hepatitis B.   Preventive Service / Frequency   Ages 26 and over  Blood pressure check.  Lipid and cholesterol check.  Lung cancer screening. / Every year if you are aged 42-80 years and have a 30-pack-year history of smoking and currently smoke or have quit within the past 15 years. Yearly screening is stopped once you have quit smoking for at least 15 years or develop a health problem that would prevent you from having lung cancer treatment.  Fecal occult blood test (FOBT) of stool. You may not have to do this test if you get a colonoscopy every 10 years.  Flexible sigmoidoscopy** or colonoscopy.** / Every 5 years for a flexible sigmoidoscopy or every 10 years for a colonoscopy beginning at age 35 and continuing until age 15.  Hepatitis C blood test.** / For all people born from [birth year] through 1966 and any individual with known risks for hepatitis C.  Abdominal aortic aneurysm (AAA) screening./ Screening current or former smokers or have Hypertension.  Skin self-exam. / Monthly.  Influenza vaccine. / Every year.  Tetanus, diphtheria, and acellular pertussis (Tdap/Td) vaccine.** / 1 dose of Td every 10 years.   Zoster vaccine.** / 1 dose for  adults aged 31 years or older.         Pneumococcal 13-valent conjugate (PCV13) vaccine.    Pneumococcal polysaccharide (PPSV23) vaccine.     Hepatitis A vaccine.** / Consult your health care provider.  Hepatitis B vaccine.** / Consult your health care provider.

## 2014-09-17 NOTE — Progress Notes (Signed)
Patient ID: [name] Colon, male   DOB: 02/21/1941, 73 y.o.   MRN: [id number]  [facility] VISIT AND CPE  Assessment:   1. Essential hypertension  - [place], RETROPERITNL ABD,  LTD - TSH  2. Hyperlipidemia  - Lipid panel  3. Type 2 diabetes mellitus with other diabetic kidney complication  - Microalbumin / creatinine urine ratio - Hemoglobin A1c - Insulin, fasting - HM DIABETES FOOT EXAM - LOW EXTREMITY NEUR EXAM DOCUM  4. Vitamin D deficiency  - Vit D  25 hydroxy (rtn osteoporosis monitoring)  5. Atherosclerosis of native coronary artery of native heart without angina pectoris   6. Screening for rectal cancer  - POC Hemoccult Bld/Stl (3-Cd Home Screen); Future  7. Screening for prostate cancer  - PSA  8. Medication management  - Urine Microscopic - CBC with Differential - BASIC METABOLIC PANEL WITH GFR - Hepatic function panel - Magnesium  9. At low risk for fall  10. Screening for depression - Negative  11. Encounter for general adult medical examination with abnormal findings  12. ASHD s/p PTCA & Stent (2003)   Plan:   During the course of the visit the patient was educated and counseled about appropriate screening and preventive services including:    Pneumococcal vaccine   Influenza vaccine  Td vaccine  Screening electrocardiogram  Bone densitometry screening  Colorectal cancer screening  Diabetes screening  Glaucoma screening  Nutrition counseling   Advanced directives: requested  Screening recommendations, referrals: Vaccinations: DT vaccine 2008 Influenza vaccine HD Oct 2015 @ [place] Pneumococcal vaccine declined Prevnar vaccine declined Shingles vaccine Advised to get for free @ Pharmacy Hep B vaccine not indicated  Nutrition assessed and recommended  Colonoscopy 09/11/2013 and f/u scheduled 09/19/2014(Dr Henrene Pastor) Recommended yearly ophthalmology/optometry visit for glaucoma screening and  checkup Recommended yearly dental visit for hygiene and checkup Advanced directives - No - Offered forms  Conditions/risks identified: BMI: Discussed weight loss, diet, and increase physical activity.  Increase physical activity: AHA recommends 150 minutes of physical activity a week.  Medications reviewed Diabetes is at goal, ARB therapy: Yes. Urinary Incontinence is not an issue: discussed non pharmacology and pharmacology options.  Fall risk: low- discussed PT, home fall assessment, medications.   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  Date of last medicare wellness visit is unknown.  He has had elevated blood pressure since the 1990's. His blood pressure has been controlled at home, today their BP is BP: 126/80 mmHg. Patient had aPTCA & Stent in 2003 by Dr Percival Spanish and had been on Plavix til Oct 2014. Neg ETT in [place]cT 2014.  He does not workout. He denies chest pain, shortness of breath, dizziness.  He is on cholesterol medication and denies myalgias. His cholesterol is at goal. The cholesterol last visit was:  Lab Results  Component Value Date   CHOL 143 07/03/2014   HDL 41 07/03/2014   LDLCALC 81 07/03/2014   TRIG 104 07/03/2014   CHOLHDL 3.5 07/03/2014   He has had diabetes for 12 years since 2003. He has been working on diet and exercise for diabetes, and denies foot ulcerations, hyperglycemia, nausea, paresthesia of the feet, polydipsia, polyuria and visual disturbances. Last A1C in the office was:  Lab Results  Component Value Date   HGBA1C 6.2* 07/03/2014   Patient is on Vitamin D supplement.  (Vit D 38 in 2008) Lab Results  Component Value Date  VD25OH 27 - still very low 03/18/2014     Names of Other Physician/Practitioners you currently use: 1. [facility] here for primary care 2. [facility] - Different Dr's - every 6 months monitoring for glaucoma 3.  Dr Evette Doffing @ [place], dentist, last visit Aug 2015  Patient Care Team: Unk Pinto, MD as PCP - General (Internal Medicine) Minus Breeding, MD as Consulting Physician (Cardiology) Irene Shipper, MD as Consulting Physician (Gastroenterology) Claybon Jabs, MD as Consulting Physician (Urology)  Medication Review: Medication Sig  . amLODipine (NORVASC) 5 MG tablet Take 5 mg by mouth daily.  Marland Kitchen aspirin 81 MG tablet Take 81 mg by mouth daily.  Marland Kitchen atenolol (TENORMIN) 100 MG tablet take 1 tablet by mouth once daily for blood pressure  . citalopram (CELEXA) 40 MG tablet Take 20 mg by mouth daily.    Marland Kitchen losartan-hydrochlorothiazide (HYZAAR) 100-25 MG per tablet take 1 tablet by mouth once daily  . MOVIPREP 100 G SOLR Take 1 kit (200 g total) by mouth once. Name brand only, movi prep as directed, no substitutions.  . nitroGLYCERIN (NITROSTAT) 0.4 MG SL tablet Place 0.4 mg under the tongue every 5 (five) minutes as needed.    . potassium chloride SA (K-DUR,KLOR-CON) 20 MEQ tablet take 1 tablet by mouth twice a day  . propranolol (INDERAL) 10 MG tablet Take 10 mg by mouth as needed. For anxiety  . pyridOXINE (VITAMIN B-6) 50 MG tablet Take 50 mg by mouth daily.    . risperiDONE (RISPERDAL) 2 MG tablet Take 1 mg by mouth at bedtime.   . simvastatin (ZOCOR) 80 MG tablet Take 40 mg by mouth at bedtime.    . Tamsulosin HCl (FLOMAX) 0.4 MG CAPS Take 0.4 mg by mouth daily.    Marland Kitchen zolpidem (AMBIEN) 10 MG tablet take 1 tablet by mouth at bedtime     Current Problems (verified) Patient Active Problem List   Diagnosis Date Noted  . Vitamin D deficiency 03/18/2014  . 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 Medication management 03/17/2014  . Long term (current) use of anticoagulants 03/17/2014  . 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 ADENOCARCINOMA, PROSTATE 08/29/2008  . COLONIC POLYPS 08/29/2008  . Hyperlipidemia 08/29/2008  . Essential hypertension 08/29/2008  . Coronary atherosclerosis 08/29/2008   . DIVERTICULOSIS, COLON 08/29/2008    Screening Tests Health Maintenance  Topic Date Due  . Foot Exam  09/26/1950  . Ophthalmology Exam  09/26/1950  . Zostavax  09/26/2000  . Pneumococcal Polysaccharide Vaccine Age 10 And Over  09/26/2005  . Urine Microalbumin  08/31/2013  . Influenza Vaccine  06/22/2014  . Colonoscopy  09/11/2014  . Hemoglobin A1c  01/03/2015  . Tetanus/tdap  11/22/2016    Immunization History  Administered Date(s) Administered  . Td 11/22/2006    Preventative care: Last colonoscopy: 09/11/2013 and scheduled 09/19/2014 w/ Dr Henrene Pastor  Prior vaccinations: TD : 2008  [facility]  Pneumococcal: Refused Shingles/Zostavax: Advised to get free at Pharmacy  History reviewed: allergies, current medications, past family history, past medical history, past social history, past surgical history and problem list  Risk Factors: Tobacco History  Substance Use Topics  . Smoking status: Former Smoker    Quit date: 11/22/1992  . Smokeless tobacco: Never Used  . Alcohol Use: No   He does not smoke.  Patient is a former smoker. Are there smokers in your home (other than you)?  No  Alcohol Current  alcohol use: none  Caffeine Current caffeine use: coffee 1-2 /day  Exercise Current exercise: none  Nutrition/Diet Current diet: in general, a "healthy" diet    Cardiac risk factors: advanced age (older than 6 for men, 66 for women), diabetes mellitus, dyslipidemia, hypertension, male gender, obesity and sedentary lifestyle.  Depression Screen (Note: if answer to either of the following is "Yes", a more complete depression screening is indicated)   Q1: Over the past two weeks, have you felt down, depressed or hopeless? No  Q2: Over the past two weeks, have you felt little interest or pleasure in doing things? No  Have you lost interest or pleasure in daily life? No  Do you often feel hopeless? No  Do you cry easily over simple problems?  No  Activities of Daily Living In your present state of health, do you have any difficulty performing the following activities?:  Driving? No Managing money?  No Feeding yourself? No Getting from bed to chair? No Climbing a flight of stairs? No Preparing food and eating?: No Bathing or showering? No Getting dressed: No Getting to the toilet? No Using the toilet:No Moving around from place to place: No In the past year have you fallen or had a near fall?:No   Are you sexually active?  No  Do you have more than one partner?  No  Vision Difficulties: No  Hearing Difficulties: No Do you often ask people to speak up or repeat themselves? No Do you experience ringing or noises in your ears? No Do you have difficulty understanding soft or whispered voices? No  Cognition  Do you feel that you have a problem with memory?No  Do you often misplace items? No  Do you feel safe at home?  Yes  Advanced directives Does patient have a [place]? No - offered forms Does patient have a Living Will? No - offered forms   Objective:     Blood pressure 126/80, pulse 60, temperature 97.6 F (36.4 C), temperature source Temporal, resp. rate 16, height _0  (1.778 m), weight 197 lb (89.359 kg). Body mass index is 28.27 kg/(m^2).  General appearance: alert, no distress, WD/WN, male Cognitive Testing  Alert? Yes  Normal Appearance? Yes  Oriented to person? Yes  Place? Yes   Time? Yes  Recall of three objects?  Yes  Can perform simple calculations? Yes  Displays appropriate judgment? Yes  Can read the correct time from a watch/clock? Yes  HEENT: normocephalic, sclerae anicteric, TMs pearly, nares patent, no discharge or erythema, pharynx normal Oral cavity: MMM, no lesions Neck: supple, no lymphadenopathy, no thyromegaly, no masses Heart: RRR, normal S1, S2, no murmurs Lungs: CTA bilaterally, no wheezes, rhonchi, or rales Abdomen: +bs, soft, non tender, non  distended, no masses, no hepatomegaly, no splenomegaly Musculoskeletal: nontender, no swelling, no obvious deformity Extremities: no edema, no cyanosis, no clubbing Pulses: 2+ symmetric, upper and lower extremities, normal cap refill Neurological: alert, oriented x 3, CN2-12 intact, strength normal upper extremities and lower extremities, sensation normal throughout, DTRs 2+ throughout, no cerebellar signs, gait normal Psychiatric: normal affect, behavior normal, pleasant   Medicare Attestation I have personally reviewed: The patient's medical and social history Their use of alcohol, tobacco or illicit drugs Their current medications and supplements The patient's functional ability including ADLs,fall risks, home safety risks, cognitive, and hearing and visual impairment Diet and physical activities Evidence for depression or mood disorders  The patient's weight, height, BMI, and visual acuity have been recorded  in the chart.  I have made referrals, counseling, and provided education to the patient based on review of the above and I have provided the patient with a written personalized care plan for preventive services.    Samai Corea DAVID, MD   09/17/2014

## 2014-09-18 ENCOUNTER — Other Ambulatory Visit: Payer: Self-pay | Admitting: Physician Assistant

## 2014-09-18 LAB — VITAMIN D 25 HYDROXY (VIT D DEFICIENCY, FRACTURES): Vit D, 25-Hydroxy: 110 ng/mL — ABNORMAL HIGH (ref 30–89)

## 2014-09-18 LAB — BASIC METABOLIC PANEL WITH GFR
BUN: 12 mg/dL (ref 6–23)
CHLORIDE: 104 meq/L (ref 96–112)
CO2: 25 mEq/L (ref 19–32)
Calcium: 10 mg/dL (ref 8.4–10.5)
Creat: 1.09 mg/dL (ref 0.50–1.35)
GFR, EST AFRICAN AMERICAN: 77 mL/min
GFR, EST NON AFRICAN AMERICAN: 67 mL/min
Glucose, Bld: 133 mg/dL — ABNORMAL HIGH (ref 70–99)
Potassium: 3.8 mEq/L (ref 3.5–5.3)
Sodium: 142 mEq/L (ref 135–145)

## 2014-09-18 LAB — PSA

## 2014-09-18 LAB — MICROALBUMIN / CREATININE URINE RATIO
Creatinine, Urine: 206.5 mg/dL
MICROALB/CREAT RATIO: 5.8 mg/g (ref 0.0–30.0)
Microalb, Ur: 1.2 mg/dL (ref <2.0)

## 2014-09-18 LAB — URINALYSIS, MICROSCOPIC ONLY
BACTERIA UA: NONE SEEN
CASTS: NONE SEEN
CRYSTALS: NONE SEEN
Squamous Epithelial / LPF: NONE SEEN

## 2014-09-18 LAB — LIPID PANEL
CHOLESTEROL: 149 mg/dL (ref 0–200)
HDL: 42 mg/dL (ref >39)
LDL Cholesterol: 93 mg/dL (ref 0–99)
TRIGLYCERIDES: 69 mg/dL (ref <150)
Total CHOL/HDL Ratio: 3.5 Ratio
VLDL: 14 mg/dL (ref 0–40)

## 2014-09-18 LAB — INSULIN, FASTING: INSULIN FASTING, SERUM: 50 u[IU]/mL — AB (ref 2.0–19.6)

## 2014-09-18 LAB — HEPATIC FUNCTION PANEL
ALBUMIN: 4.7 g/dL (ref 3.5–5.2)
ALT: 18 U/L (ref 0–53)
AST: 22 U/L (ref 0–37)
Alkaline Phosphatase: 47 U/L (ref 39–117)
BILIRUBIN TOTAL: 0.8 mg/dL (ref 0.2–1.2)
Bilirubin, Direct: 0.2 mg/dL (ref 0.0–0.3)
Indirect Bilirubin: 0.6 mg/dL (ref 0.2–1.2)
TOTAL PROTEIN: 7.4 g/dL (ref 6.0–8.3)

## 2014-09-18 LAB — TSH: TSH: 2.672 u[IU]/mL (ref 0.350–4.500)

## 2014-09-18 LAB — HEMOGLOBIN A1C
Hgb A1c MFr Bld: 6.1 % — ABNORMAL HIGH (ref <5.7)
MEAN PLASMA GLUCOSE: 128 mg/dL — AB (ref <117)

## 2014-09-18 LAB — MAGNESIUM: Magnesium: 1.9 mg/dL (ref 1.5–2.5)

## 2014-09-19 ENCOUNTER — Encounter: Payer: Self-pay | Admitting: Internal Medicine

## 2014-09-19 ENCOUNTER — Ambulatory Visit (AMBULATORY_SURGERY_CENTER): Payer: Medicare Other | Admitting: Internal Medicine

## 2014-09-19 VITALS — BP 152/97 | HR 45 | Temp 96.4°F | Resp 23 | Ht 69.0 in | Wt 197.0 lb

## 2014-09-19 DIAGNOSIS — D120 Benign neoplasm of cecum: Secondary | ICD-10-CM

## 2014-09-19 DIAGNOSIS — D123 Benign neoplasm of transverse colon: Secondary | ICD-10-CM

## 2014-09-19 DIAGNOSIS — Z86010 Personal history of colonic polyps: Secondary | ICD-10-CM

## 2014-09-19 MED ORDER — SODIUM CHLORIDE 0.9 % IV SOLN: 500.0000 mL | INTRAVENOUS | Status: DC

## 2014-09-19 NOTE — Progress Notes (Signed)
Called to room to assist during endoscopic procedure.  Patient ID and intended procedure confirmed with present staff. Received instructions for my participation in the procedure from the performing physician.  

## 2014-09-19 NOTE — Patient Instructions (Signed)
YOU HAD AN ENDOSCOPIC PROCEDURE TODAY AT THE [facility]: Refer to the procedure report that was given to you for any specific questions about what was found during the examination.  If the procedure report does not answer your questions, please call your gastroenterologist to clarify.  If you requested that your care partner not be given the details of your procedure findings, then the procedure report has been included in a sealed envelope for you to review at your convenience later.  YOU SHOULD EXPECT: Some feelings of bloating in the abdomen. Passage of more gas than usual.  Walking can help get rid of the air that was put into your GI tract during the procedure and reduce the bloating. If you had a lower endoscopy (such as a colonoscopy or flexible sigmoidoscopy) you may notice spotting of blood in your stool or on the toilet paper. If you underwent a bowel prep for your procedure, then you may not have a normal bowel movement for a few days.  DIET: Your first meal following the procedure should be a light meal and then it is ok to progress to your normal diet.  A half-sandwich or bowl of soup is an example of a good first meal.  Heavy or fried foods are harder to digest and may make you feel nauseous or bloated.  Likewise meals heavy in dairy and vegetables can cause extra gas to form and this can also increase the bloating.  Drink plenty of fluids but you should avoid alcoholic beverages for 24 hours.  ACTIVITY: Your care partner should take you home directly after the procedure.  You should plan to take it easy, moving slowly for the rest of the day.  You can resume normal activity the day after the procedure however you should NOT DRIVE or use heavy machinery for 24 hours (because of the sedation medicines used during the test).    SYMPTOMS TO REPORT IMMEDIATELY: A gastroenterologist can be reached at any hour.  During normal business hours, 8:30 AM to 5:00 PM Monday through Friday,  call [phone].  After hours and on weekends, please call the GI answering service at [phone] who will take a message and have the physician on call contact you.   Following lower endoscopy (colonoscopy or flexible sigmoidoscopy):  Excessive amounts of blood in the stool  Significant tenderness or worsening of abdominal pains  Swelling of the abdomen that is new, acute  Fever of 100F or higher  FOLLOW UP: If any biopsies were taken you will be contacted by phone or by letter within the next 1-3 weeks.  Call your gastroenterologist if you have not heard about the biopsies in 3 weeks.  Our staff will call the home number listed on your records the next business day following your procedure to check on you and address any questions or concerns that you may have at that time regarding the information given to you following your procedure. This is a courtesy call and so if there is no answer at the home number and we have not heard from you through the emergency physician on call, we will assume that you have returned to your regular daily activities without incident.  SIGNATURES/CONFIDENTIALITY: You and/or your care partner have signed paperwork which will be entered into your electronic medical record.  These signatures attest to the fact that that the information above on your After Visit Summary has been reviewed and is understood.  Full responsibility of the confidentiality of this   discharge information lies with you and/or your care-partner.   Resume medications. Information given on polyps, diverticulosis and proctitis and high fiber diet with discharge instructions.

## 2014-09-19 NOTE — Progress Notes (Signed)
Report to PACU, RN, vss, BBS= Clear.  

## 2014-09-19 NOTE — Op Note (Signed)
[place]  [address] [place], [id number]   COLONOSCOPY PROCEDURE REPORT  PATIENT: Fox, Salminen  MR#: [id number] BIRTHDATE: 10/22/1941 , 70  yrs. old GENDER: male ENDOSCOPIST: Eustace Quail, MD REFERRED EX:[id number] Program Recall PROCEDURE DATE:  09/19/2014 PROCEDURE:   Colonoscopy with snare polypectomy -4 First Screening Colonoscopy - Avg.  risk and is 50 yrs.  old or older - No.  Prior Negative Screening - Now for repeat screening. N/A  History of Adenoma - Now for follow-up colonoscopy & has been > or = to 3 yrs.  No.  It has been less than 3 yrs since last colonoscopy.  Medical reason.  Polyps Removed Today? Yes. ASA CLASS:   Class III INDICATIONS:surveillance colonoscopy based on a history of adenomatous colonic polyp(s). Previous examinations 2004, 2006, 2009, and most recently October 2014 (greater than 20 adenomas). MEDICATIONS: Monitored anesthesia care and Propofol 200 mg IV  DESCRIPTION OF PROCEDURE:   After the risks benefits and alternatives of the procedure were thoroughly explained, informed consent was obtained.  The digital rectal exam revealed no abnormalities of the rectum.   The LB [id number]  endoscope was introduced through the anus and advanced to the cecum, which was identified by both the appendix and ileocecal valve. No adverse events experienced.   The quality of the prep was good, using MoviPrep  The instrument was then slowly withdrawn as the colon was fully examined.  COLON FINDINGS: Four polyps, 3-23mm in size, were found in the transverse colon(3) and cecum.  A cold snare polypectomy was performed.  The resection was complete, the polyp tissue was retrieved and sent to histology.   There was moderate diverticulosis in the left colon. 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   The examination was otherwise normal.  Retroflexed views revealed no abnormalities. The time to cecum=2 min 47 sec. Withdrawal time=12  minutes 19 seconds.  The scope was withdrawn and the procedure completed. COMPLICATIONS: There were no immediate complications.  ENDOSCOPIC IMPRESSION: 1.   Four polyps were found in the transverse colon and at the cecum; polypectomy was performed with a cold snare 2.   Moderate diverticulosis was noted in the left colon 3.   Radiation proctitis 4.   The examination was otherwise normal  RECOMMENDATIONS: 1. Repeat Colonoscopy in 3 years.  eSigned:  Eustace Quail, MD 09/19/2014 12:04 PM   cc: Unk Pinto, MD and The Patient   PATIENT NAME:  Apolo, Cutshaw MR#: [id number]

## 2014-09-20 ENCOUNTER — Telehealth: Payer: Self-pay | Admitting: *Deleted

## 2014-09-20 NOTE — Telephone Encounter (Signed)
  Follow up Call-  Call back number 09/19/2014 09/11/2013  Post procedure Call Back phone  # [phone]  Permission to leave phone message Yes Yes     Patient questions:  Do you have a fever, pain , or abdominal swelling? No. Pain Score  0 *  Have you tolerated food without any problems? Yes.    Have you been able to return to your normal activities? Yes.    Do you have any questions about your discharge instructions: Diet   No. Medications  No. Follow up visit  No.  Do you have questions or concerns about your Care? No.  Actions: * If pain score is 4 or above: No action needed, pain <4.

## 2014-09-24 ENCOUNTER — Encounter: Payer: Self-pay | Admitting: Internal Medicine

## 2014-11-12 ENCOUNTER — Encounter: Payer: Self-pay | Admitting: Physician Assistant

## 2014-11-12 ENCOUNTER — Ambulatory Visit (INDEPENDENT_AMBULATORY_CARE_PROVIDER_SITE_OTHER): Payer: Medicare Other | Admitting: Physician Assistant

## 2014-11-12 ENCOUNTER — Ambulatory Visit (HOSPITAL_COMMUNITY)
Admission: RE | Admit: 2014-11-12 | Discharge: 2014-11-12 | Disposition: A | Discharge status: Home / Self Care | Payer: Medicare Other | Source: Ambulatory Visit | Attending: Physician Assistant | Admitting: Physician Assistant

## 2014-11-12 VITALS — BP 146/80 | HR 78 | Temp 98.2°F | Resp 16 | Ht 70.0 in | Wt 200.0 lb

## 2014-11-12 DIAGNOSIS — R05 Cough: Secondary | ICD-10-CM | POA: Insufficient documentation

## 2014-11-12 DIAGNOSIS — R059 Cough, unspecified: Secondary | ICD-10-CM

## 2014-11-12 DIAGNOSIS — J209 Acute bronchitis, unspecified: Secondary | ICD-10-CM

## 2014-11-12 DIAGNOSIS — R0989 Other specified symptoms and signs involving the circulatory and respiratory systems: Secondary | ICD-10-CM | POA: Diagnosis not present

## 2014-11-12 DIAGNOSIS — I2510 Atherosclerotic heart disease of native coronary artery without angina pectoris: Secondary | ICD-10-CM

## 2014-11-12 IMAGING — CR DG CHEST 2V
2 series · 2 of 2 positions shown · non-contrast
Comparison: None.

CLINICAL DATA: Cough, chest cold/congestion x1 week

EXAM:
CHEST  2 VIEW

[view not recorded (1 of 2)]
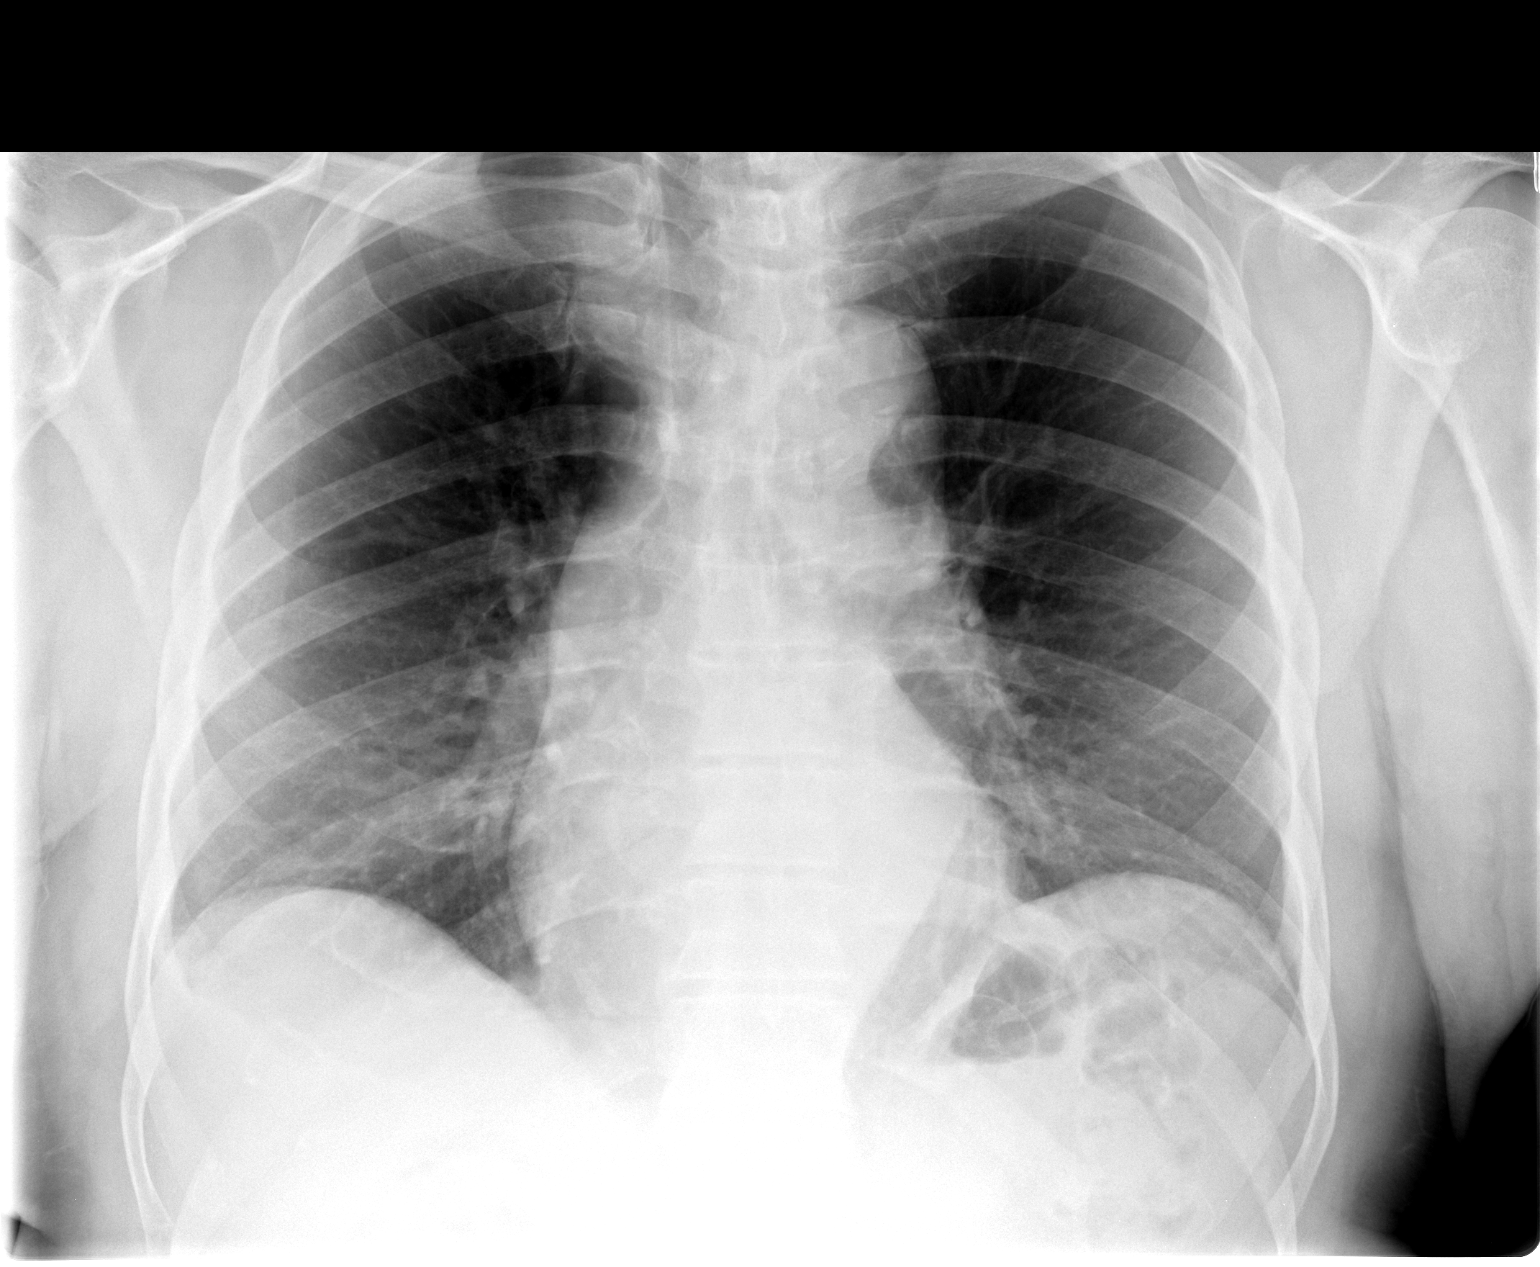

[view not recorded (2 of 2)]
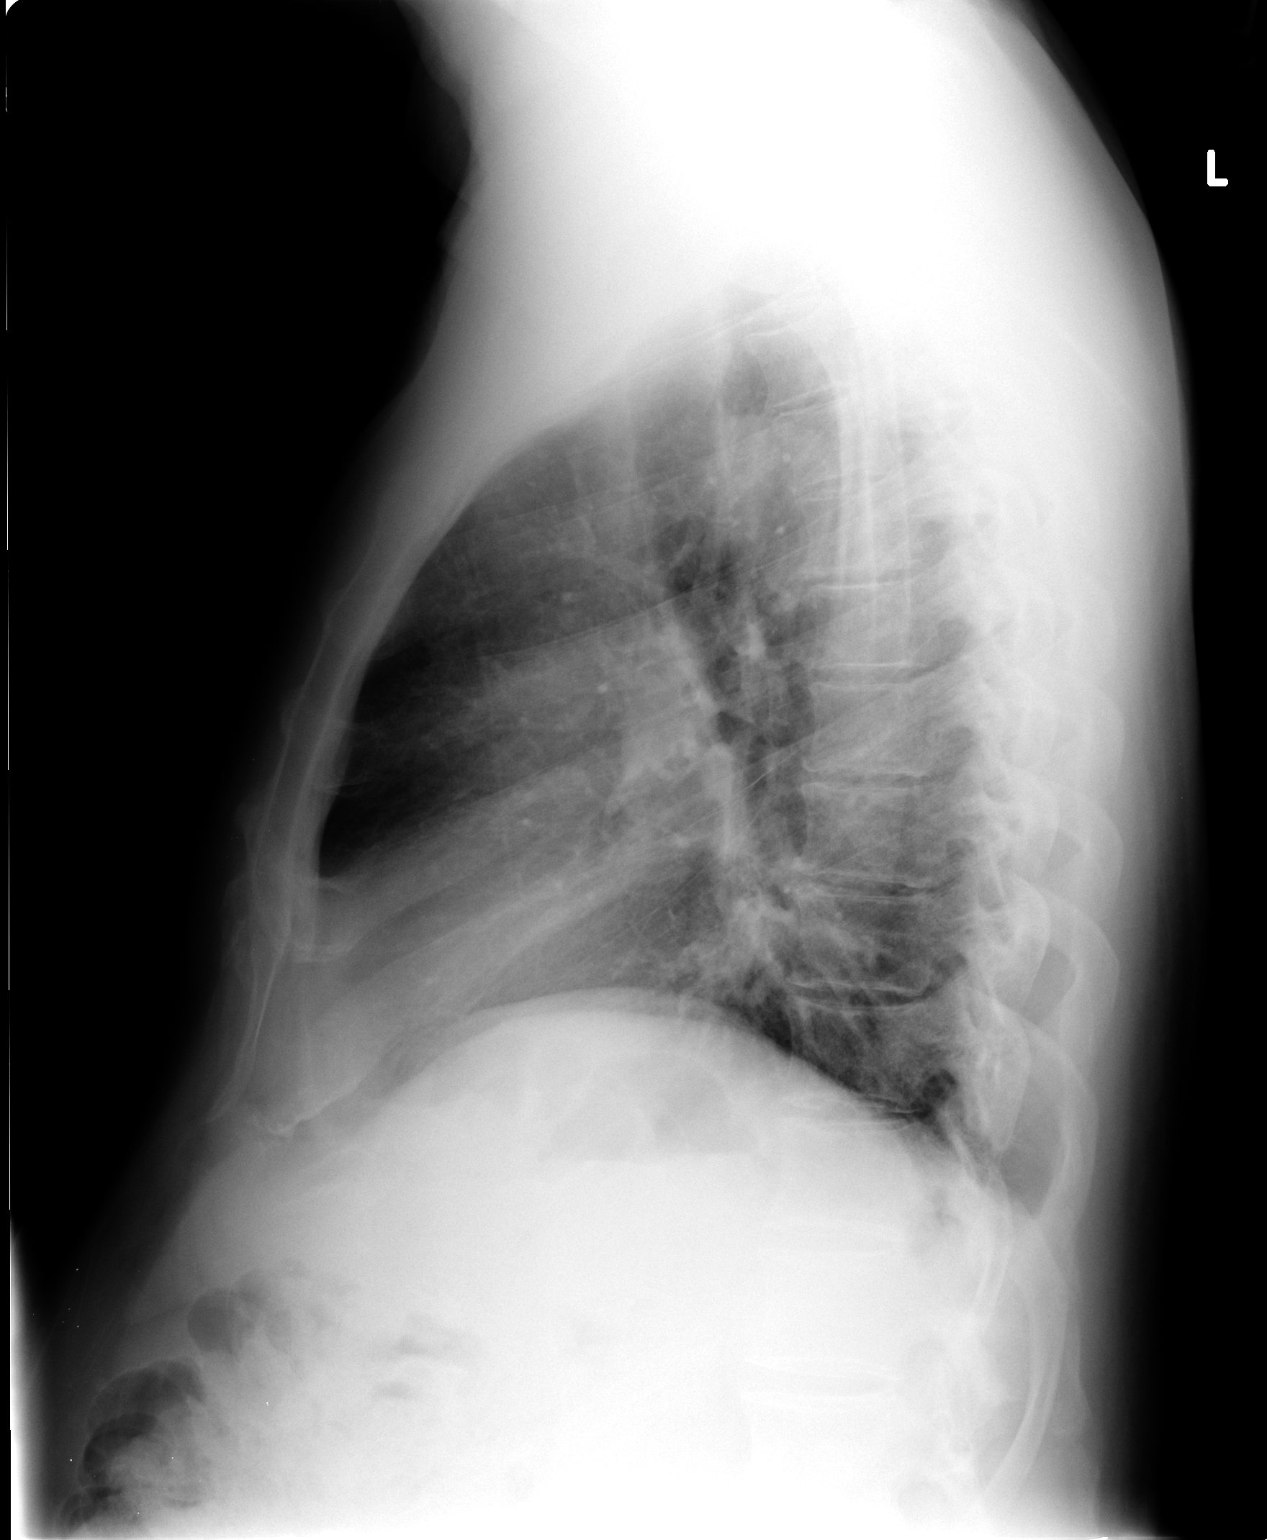

[2 of 2 positions shown; findings below may reference images not displayed]

FINDINGS: Mild patchy right basilar opacity, atelectasis versus pneumonia. No
pleural effusion or pneumothorax.

Heart is normal in size.

Visualized osseous structures are within normal limits.
IMPRESSION: Mild patchy right basilar opacity, atelectasis versus pneumonia.

## 2014-11-12 MED ORDER — BENZONATATE 100 MG PO CAPS: 200.0000 mg | ORAL_CAPSULE | Freq: Three times a day (TID) | ORAL | Status: DC | PRN

## 2014-11-12 MED ORDER — PREDNISONE 10 MG PO TABS: ORAL_TABLET | ORAL | Status: AC

## 2014-11-12 MED ORDER — IPRATROPIUM-ALBUTEROL 0.5-2.5 (3) MG/3ML IN SOLN
3.0000 mL | Freq: Once | RESPIRATORY_TRACT | Status: AC
  Administered 2014-11-12: 3 mL via RESPIRATORY_TRACT

## 2014-11-12 MED ORDER — DOXYCYCLINE HYCLATE 100 MG PO TABS: 100.0000 mg | ORAL_TABLET | Freq: Two times a day (BID) | ORAL | Status: DC

## 2014-11-12 NOTE — Progress Notes (Signed)
Subjective:    Patient ID: [name] Colon, male    DOB: 05-13-1941, 73 y.o.   MRN: [id number]  Cough This is a new problem. Episode onset: 1 week ago. The problem has been gradually improving. Cough characteristics: Mucus with yellow brown color. Associated symptoms include headaches and a sore throat. Pertinent negatives include no chills, ear pain, fever, postnasal drip, rash, rhinorrhea, shortness of breath or wheezing. The symptoms are aggravated by lying down. Risk factors: Former Smoker- Quit 1994- Used to smoke 1ppd and started at age 60. Treatments tried: Coricidin OTC. The treatment provided no relief.  GFR= 67 on 09/17/14 Review of Systems  Constitutional: Positive for fatigue. Negative for fever, chills and diaphoresis.  HENT: Positive for congestion and sore throat. Negative for ear discharge, ear pain, postnasal drip, rhinorrhea, sinus pressure and trouble swallowing.   Eyes: Negative.   Respiratory: Positive for cough. Negative for chest tightness, shortness of breath and wheezing.   Cardiovascular: Negative.   Gastrointestinal: Negative.  Negative for nausea, vomiting, abdominal pain, diarrhea and constipation.  Skin: Negative.  Negative for rash.  Neurological: Positive for dizziness and headaches. Negative for light-headedness.   Past Medical History  Diagnosis Date  . History of colonic polyps   . Diverticulitis, colon   . CAD (coronary artery disease)   . Adenocarcinoma of prostate     s/p seed implantation  . Hyperlipidemia   . HTN (hypertension)   . Radiation proctitis     with chronic bleeding  . Axillary abscess     left  . Arthritis    Current Outpatient Prescriptions on File Prior to Visit  Medication Sig Dispense Refill  . amLODipine (NORVASC) 5 MG tablet Take 5 mg by mouth daily.    Marland Kitchen aspirin 81 MG tablet Take 81 mg by mouth daily.    Marland Kitchen atenolol (TENORMIN) 100 MG tablet take 1 tablet by mouth once daily for blood pressure 90 tablet 1  . citalopram  (CELEXA) 40 MG tablet Take 20 mg by mouth daily.      Marland Kitchen losartan-hydrochlorothiazide (HYZAAR) 100-25 MG per tablet take 1 tablet by mouth once daily 30 tablet 5  . nitroGLYCERIN (NITROSTAT) 0.4 MG SL tablet Place 0.4 mg under the tongue every 5 (five) minutes as needed.      . potassium chloride SA (K-DUR,KLOR-CON) 20 MEQ tablet take 1 tablet by mouth twice a day 60 tablet 3  . propranolol (INDERAL) 10 MG tablet Take 10 mg by mouth as needed. For anxiety    . pyridOXINE (VITAMIN B-6) 50 MG tablet Take 50 mg by mouth daily.      . risperiDONE (RISPERDAL) 2 MG tablet Take 1 mg by mouth at bedtime.     . simvastatin (ZOCOR) 80 MG tablet Take 40 mg by mouth at bedtime.      . Tamsulosin HCl (FLOMAX) 0.4 MG CAPS Take 0.4 mg by mouth daily.      Marland Kitchen zolpidem (AMBIEN) 10 MG tablet take 1 tablet by mouth at bedtime 30 tablet 4   No current facility-administered medications on file prior to visit.   Allergies  Allergen Reactions  . Viagra [Sildenafil Citrate]      BP 146/80 mmHg  Pulse 78  Temp(Src) 98.2 F (36.8 C) (Temporal)  Resp 16  Ht 5\' 10"  (1.778 m)  Wt 200 lb (90.719 kg)  BMI 28.70 kg/m2  SpO2 93% SpO2 after DuoNeb was 98% Wt Readings from Last 3 Encounters:  11/12/14 200 lb (90.719 kg)  09/19/14 197 lb (89.359 kg)  09/17/14 197 lb (89.359 kg)   Objective:   Physical Exam  Constitutional: He is oriented to person, place, and time. He appears well-developed and well-nourished. He has a sickly appearance. No distress.  HENT:  Head: Normocephalic.  Right Ear: Tympanic membrane, external ear and ear canal normal.  Left Ear: Tympanic membrane, external ear and ear canal normal.  Nose: Nose normal. Right sinus exhibits no maxillary sinus tenderness and no frontal sinus tenderness. Left sinus exhibits no maxillary sinus tenderness and no frontal sinus tenderness.  Mouth/Throat: Uvula is midline and mucous membranes are normal. Mucous membranes are not pale and not dry. No trismus in the  jaw. No uvula swelling. Posterior oropharyngeal erythema present. No oropharyngeal exudate, posterior oropharyngeal edema or tonsillar abscesses.  Eyes: Conjunctivae and lids are normal. Pupils are equal, round, and reactive to light. Right eye exhibits no discharge. Left eye exhibits no discharge. No scleral icterus.  Neck: Trachea normal, normal range of motion and phonation normal. Neck supple. No tracheal tenderness present. No tracheal deviation present.  Cardiovascular: Normal rate, regular rhythm, S1 normal, S2 normal and normal pulses.  Exam reveals no gallop, no distant heart sounds and no friction rub.   Murmur heard.  Systolic murmur is present with a grade of 2/6  Pulmonary/Chest: Effort normal. No stridor. No respiratory distress. He has decreased breath sounds. He has no wheezes. He has no rhonchi. He has no rales. He exhibits no tenderness.  Decreased breath sounds in all lung fields.  Abdominal: Soft. Bowel sounds are normal. There is no tenderness. There is no rebound and no guarding.  Lymphadenopathy:  No tenderness or LAD.  Neurological: He is alert and oriented to person, place, and time. Gait normal.  Skin: Skin is warm, dry and intact. No rash noted. He is not diaphoretic.  Psychiatric: He has a normal mood and affect. His speech is normal and behavior is normal. Judgment and thought content normal. Cognition and memory are normal.  Vitals reviewed.  Assessment & Plan:  1. Acute bronchitis, unspecified organism -Take Prednisone as prescribed for inflammation- predniSONE (DELTASONE) 10 MG tablet; Take 3 tablets PO for 2 days, then take 2 tablets PO for 2 days, then take 1 tablet PO for 3 days.  Dispense: 13 tablet; Refill: 0 -Take Tessalon Perles as prescribed for cough- benzonatate (TESSALON PERLES) 100 MG capsule; Take 2 capsules (200 mg total) by mouth 3 (three) times daily as needed for cough (Max: 600mg  per day (6 capsules per day)).  Dispense: 120 capsule; Refill: 0 -  Gave DuoNeb in office -ipratropium-albuterol (DUONEB) 0.5-2.5 (3) MG/3ML nebulizer solution 3 mL; Take 3 mLs by nebulization once. -Take doxycycline as prescribed with food- doxycycline (VIBRA-TABS) 100 MG tablet; Take 1 tablet (100 mg total) by mouth 2 (two) times daily. For 7 days  Dispense: 14 tablet; Refill: 0  2. Cough Ordered Chest X-Ray to R/O Pneumonia - DG Chest 2 View; Future  Discussed medication effects and SE's.  Pt agreed to treatment plan. If you are not feeling better in 10-14 days, then please call the office. Please keep your follow up appt on 01/14/15.  Addendum:  Chest X-ray showed "Mild patchy right basilar opacity, atelectasis versus pneumonia".  Please continue medications as prescribed.  Please follow up in 1 week.   Shakila Mak, Stephani Police, PA-C 9:36 AM [facility]

## 2014-11-12 NOTE — Patient Instructions (Addendum)
-Take Doxycycline as prescribed with food for bronchitis. -Take Prednisone as prescribed for inflammation. -Take Tessalon Perles as prescribed for cough. -Please get chest x-ray at [facility] hospital. If your chest x-ray shows pneumonia, then please follow up in 1 week.  If you are not feeling better in 10-14 days, then please call the office. If you get worsening shortness of breath, chest pain, cough, abdominal pain, nausea, vomiting, diarrhea, constipation, then please go to ED immediately.  Acute Bronchitis Bronchitis is inflammation of the airways that extend from the windpipe into the lungs (bronchi). The inflammation often causes mucus to develop. This leads to a cough, which is the most common symptom of bronchitis.  In acute bronchitis, the condition usually develops suddenly and goes away over time, usually in a couple weeks. Smoking, allergies, and asthma can make bronchitis worse. Repeated episodes of bronchitis may cause further lung problems.  CAUSES Acute bronchitis is most often caused by the same virus that causes a cold. The virus can spread from person to person (contagious) through coughing, sneezing, and touching contaminated objects. SIGNS AND SYMPTOMS   Cough.   Fever.   Coughing up mucus.   Body aches.   Chest congestion.   Chills.   Shortness of breath.   Sore throat.  DIAGNOSIS  Acute bronchitis is usually diagnosed through a physical exam. Your health care provider will also ask you questions about your medical history. Tests, such as chest X-rays, are sometimes done to rule out other conditions.  TREATMENT  Acute bronchitis usually goes away in a couple weeks. Oftentimes, no medical treatment is necessary. Medicines are sometimes given for relief of fever or cough. Antibiotic medicines are usually not needed but may be prescribed in certain situations. In some cases, an inhaler may be recommended to help reduce shortness of breath and control the  cough. A cool mist vaporizer may also be used to help thin bronchial secretions and make it easier to clear the chest.  HOME CARE INSTRUCTIONS  Get plenty of rest.   Drink enough fluids to keep your urine clear or pale yellow (unless you have a medical condition that requires fluid restriction). Increasing fluids may help thin your respiratory secretions (sputum) and reduce chest congestion, and it will prevent dehydration.   Take medicines only as directed by your health care provider.  If you were prescribed an antibiotic medicine, finish it all even if you start to feel better.  Avoid smoking and secondhand smoke. Exposure to cigarette smoke or irritating chemicals will make bronchitis worse. If you are a smoker, consider using nicotine gum or skin patches to help control withdrawal symptoms. Quitting smoking will help your lungs heal faster.   Reduce the chances of another bout of acute bronchitis by washing your hands frequently, avoiding people with cold symptoms, and trying not to touch your hands to your mouth, nose, or eyes.   Keep all follow-up visits as directed by your health care provider.  SEEK MEDICAL CARE IF: Your symptoms do not improve after 1 week of treatment.  SEEK IMMEDIATE MEDICAL CARE IF:  You develop an increased fever or chills.   You have chest pain.   You have severe shortness of breath.  You have bloody sputum.   You develop dehydration.  You faint or repeatedly feel like you are going to pass out.  You develop repeated vomiting.  You develop a severe headache. MAKE SURE YOU:   Understand these instructions.  Will watch your condition.  Will get help  right away if you are not doing well or get worse. Document Released: 12/16/2004 Document Revised: 03/25/2014 Document Reviewed: 05/01/2013 [facility] Patient Information 2015 [place], [place]. This information is not intended to replace advice given to you by your health care provider. Make  sure you discuss any questions you have with your health care provider.

## 2014-11-18 ENCOUNTER — Other Ambulatory Visit: Payer: Self-pay | Admitting: *Deleted

## 2014-11-18 MED ORDER — ATENOLOL 100 MG PO TABS: ORAL_TABLET | ORAL | Status: DC

## 2014-11-20 ENCOUNTER — Ambulatory Visit (INDEPENDENT_AMBULATORY_CARE_PROVIDER_SITE_OTHER): Payer: Medicare Other | Admitting: Physician Assistant

## 2014-11-20 ENCOUNTER — Encounter: Payer: Self-pay | Admitting: Physician Assistant

## 2014-11-20 VITALS — BP 140/76 | HR 54 | Temp 98.2°F | Resp 16 | Ht 70.0 in | Wt 197.0 lb

## 2014-11-20 DIAGNOSIS — I2510 Atherosclerotic heart disease of native coronary artery without angina pectoris: Secondary | ICD-10-CM

## 2014-11-20 DIAGNOSIS — J189 Pneumonia, unspecified organism: Secondary | ICD-10-CM

## 2014-11-20 NOTE — Patient Instructions (Addendum)
-continue medications as prescribed. Please drink water to stay hydrated.  Please keep follow up appt on 01/14/15  Pneumonia Pneumonia is an infection of the lungs.  CAUSES Pneumonia may be caused by bacteria or a virus. Usually, these infections are caused by breathing infectious particles into the lungs (respiratory tract). SIGNS AND SYMPTOMS   Cough.  Fever.  Chest pain.  Increased rate of breathing.  Wheezing.  Mucus production. DIAGNOSIS  If you have the common symptoms of pneumonia, your health care provider will typically confirm the diagnosis with a chest X-ray. The X-ray will show an abnormality in the lung (pulmonary infiltrate) if you have pneumonia. Other tests of your blood, urine, or sputum may be done to find the specific cause of your pneumonia. Your health care provider may also do tests (blood gases or pulse oximetry) to see how well your lungs are working. TREATMENT  Some forms of pneumonia may be spread to other people when you cough or sneeze. You may be asked to wear a mask before and during your exam. Pneumonia that is caused by bacteria is treated with antibiotic medicine. Pneumonia that is caused by the influenza virus may be treated with an antiviral medicine. Most other viral infections must run their course. These infections will not respond to antibiotics.  HOME CARE INSTRUCTIONS   Cough suppressants may be used if you are losing too much rest. However, coughing protects you by clearing your lungs. You should avoid using cough suppressants if you can.  Your health care provider may have prescribed medicine if he or she thinks your pneumonia is caused by bacteria or influenza. Finish your medicine even if you start to feel better.  Your health care provider may also prescribe an expectorant. This loosens the mucus to be coughed up.  Take medicines only as directed by your health care provider.  Do not smoke. Smoking is a common cause of bronchitis and  can contribute to pneumonia. If you are a smoker and continue to smoke, your cough may last several weeks after your pneumonia has cleared.  A cold steam vaporizer or humidifier in your room or home may help loosen mucus.  Coughing is often worse at night. Sleeping in a semi-upright position in a recliner or using a couple pillows under your head will help with this.  Get rest as you feel it is needed. Your body will usually let you know when you need to rest. PREVENTION A pneumococcal shot (vaccine) is available to prevent a common bacterial cause of pneumonia. This is usually suggested for:  People over 67 years old.  Patients on chemotherapy.  People with chronic lung problems, such as bronchitis or emphysema.  People with immune system problems. If you are over 65 or have a high risk condition, you may receive the pneumococcal vaccine if you have not received it before. In some countries, a routine influenza vaccine is also recommended. This vaccine can help prevent some cases of pneumonia.You may be offered the influenza vaccine as part of your care. If you smoke, it is time to quit. You may receive instructions on how to stop smoking. Your health care provider can provide medicines and counseling to help you quit. SEEK MEDICAL CARE IF: You have a fever. SEEK IMMEDIATE MEDICAL CARE IF:   Your illness becomes worse. This is especially true if you are elderly or weakened from any other disease.  You cannot control your cough with suppressants and are losing sleep.  You begin coughing up  blood.  You develop pain which is getting worse or is uncontrolled with medicines.  Any of the symptoms which initially brought you in for treatment are getting worse rather than better.  You develop shortness of breath or chest pain. MAKE SURE YOU:   Understand these instructions.  Will watch your condition.  Will get help right away if you are not doing well or get worse. Document  Released: 11/08/2005 Document Revised: 03/25/2014 Document Reviewed: 01/28/2011 [facility] Patient Information 2015 [place], [place]. This information is not intended to replace advice given to you by your health care provider. Make sure you discuss any questions you have with your health care provider.

## 2014-11-20 NOTE — Progress Notes (Signed)
Subjective:    Patient ID: [name] Colon, male    DOB: 25-Apr-1941, 73 y.o.   MRN: [id number]  HPI Comments: An African American 73yo male presents to the office today for 1 week follow up for pneumonia.  Patient was prescribed doxycyline and patient states he started it on 11/12/14 and finished it on 11/18/14.  Patient states the tessalon perles are working for cough.  Patient states he took prednisone as prescribed and finished it on 11/18/14.  Patient states he is feeling better and is only having the cough right now.  Patients chest x-ray on 11/12/14 showed "Mild patchy right basilar opacity, atelectasis versus pneumonia". GFR= 67 on 09/17/14 Review of Systems  Constitutional: Negative.  Negative for fever, chills, diaphoresis and fatigue.  HENT: Negative.  Negative for congestion, ear discharge, ear pain, postnasal drip, rhinorrhea, sinus pressure, sore throat and trouble swallowing.   Eyes: Negative.   Respiratory: Positive for cough. Negative for chest tightness, shortness of breath and wheezing.   Cardiovascular: Negative.   Gastrointestinal: Negative.  Negative for nausea, vomiting, abdominal pain, diarrhea and constipation.  Musculoskeletal: Negative.   Skin: Negative.   Neurological: Negative.  Negative for dizziness, light-headedness and headaches.   Past Medical History  Diagnosis Date  . History of colonic polyps   . Diverticulitis, colon   . CAD (coronary artery disease)   . Adenocarcinoma of prostate     s/p seed implantation  . Hyperlipidemia   . HTN (hypertension)   . Radiation proctitis     with chronic bleeding  . Axillary abscess     left  . Arthritis    Current Outpatient Prescriptions on File Prior to Visit  Medication Sig Dispense Refill  . amLODipine (NORVASC) 5 MG tablet Take 5 mg by mouth daily.    Marland Kitchen aspirin 81 MG tablet Take 81 mg by mouth daily.    Marland Kitchen atenolol (TENORMIN) 100 MG tablet take 1 tablet by mouth once daily for blood pressure 90 tablet 1   . benzonatate (TESSALON PERLES) 100 MG capsule Take 2 capsules (200 mg total) by mouth 3 (three) times daily as needed for cough (Max: 600mg  per day (6 capsules per day)). 120 capsule 0  . citalopram (CELEXA) 40 MG tablet Take 20 mg by mouth daily.      Marland Kitchen losartan-hydrochlorothiazide (HYZAAR) 100-25 MG per tablet take 1 tablet by mouth once daily 30 tablet 5  . nitroGLYCERIN (NITROSTAT) 0.4 MG SL tablet Place 0.4 mg under the tongue every 5 (five) minutes as needed.      . potassium chloride SA (K-DUR,KLOR-CON) 20 MEQ tablet take 1 tablet by mouth twice a day 60 tablet 3  . propranolol (INDERAL) 10 MG tablet Take 10 mg by mouth as needed. For anxiety    . pyridOXINE (VITAMIN B-6) 50 MG tablet Take 50 mg by mouth daily.      . risperiDONE (RISPERDAL) 2 MG tablet Take 1 mg by mouth at bedtime.     . simvastatin (ZOCOR) 80 MG tablet Take 40 mg by mouth at bedtime.      . Tamsulosin HCl (FLOMAX) 0.4 MG CAPS Take 0.4 mg by mouth daily.      Marland Kitchen zolpidem (AMBIEN) 10 MG tablet take 1 tablet by mouth at bedtime 30 tablet 4   No current facility-administered medications on file prior to visit.   Allergies  Allergen Reactions  . Viagra [Sildenafil Citrate]      BP 140/76 mmHg  Pulse 54  Temp(Src) 98.2 F (  36.8 C) (Temporal)  Resp 16  Ht 5\' 10"  (1.778 m)  Wt 197 lb (89.359 kg)  BMI 28.27 kg/m2  SpO2 97%  Wt Readings from Last 3 Encounters:  11/20/14 197 lb (89.359 kg)  11/12/14 200 lb (90.719 kg)  09/19/14 197 lb (89.359 kg)   Objective:   Physical Exam  Constitutional: He is oriented to person, place, and time. He appears well-developed and well-nourished. He does not have a sickly appearance. No distress.  HENT:  Head: Normocephalic.  Nose: Nose normal. Right sinus exhibits no maxillary sinus tenderness and no frontal sinus tenderness. Left sinus exhibits no maxillary sinus tenderness and no frontal sinus tenderness.  Mouth/Throat: Uvula is midline, oropharynx is clear and moist and  mucous membranes are normal. Mucous membranes are not pale and not dry. No trismus in the jaw. No uvula swelling. No oropharyngeal exudate, posterior oropharyngeal edema, posterior oropharyngeal erythema or tonsillar abscesses.  Eyes: Conjunctivae and lids are normal. Pupils are equal, round, and reactive to light. Right eye exhibits no discharge. Left eye exhibits no discharge. No scleral icterus.  Neck: Trachea normal, normal range of motion and phonation normal. Neck supple. No tracheal tenderness present. No tracheal deviation present.  Cardiovascular: Normal rate, regular rhythm, S1 normal, S2 normal and normal pulses.  Exam reveals no gallop, no distant heart sounds and no friction rub.   Murmur heard.  Systolic murmur is present with a grade of 2/6  Pulmonary/Chest: Effort normal. No accessory muscle usage or stridor. No respiratory distress. He has no decreased breath sounds. He has no wheezes. He has no rhonchi. He has no rales. He exhibits no tenderness.  Abdominal: Soft. Bowel sounds are normal. There is no tenderness. There is no rebound and no guarding.  Lymphadenopathy:  No tenderness or LAD.  Neurological: He is alert and oriented to person, place, and time. Gait normal.  Skin: Skin is warm, dry and intact. No rash noted. He is not diaphoretic. No pallor.  Psychiatric: He has a normal mood and affect. His speech is normal and behavior is normal. Judgment and thought content normal. Cognition and memory are normal.  Vitals reviewed.  Assessment & Plan:  1. Pneumonia, organism unspecified- follow up and "feeling much better" -Continue Tessalon Perles as prescribed for cough  Patient is interested in getting Pneumonia Vaccines started at next visit.  Discussed medication effects and SE's.  Pt agreed to treatment plan. Please keep your follow up appt on 01/14/15.   Jaelene Garciagarcia, Stephani Police, PA-C 8:47 AM [facility]

## 2014-12-24 ENCOUNTER — Other Ambulatory Visit: Payer: Self-pay | Admitting: Physician Assistant

## 2015-01-14 ENCOUNTER — Encounter: Payer: Self-pay | Admitting: Physician Assistant

## 2015-01-14 ENCOUNTER — Ambulatory Visit (INDEPENDENT_AMBULATORY_CARE_PROVIDER_SITE_OTHER): Payer: Medicare Other | Admitting: Physician Assistant

## 2015-01-14 VITALS — BP 130/78 | HR 52 | Temp 98.0°F | Resp 16 | Ht 70.0 in | Wt 202.0 lb

## 2015-01-14 DIAGNOSIS — E559 Vitamin D deficiency, unspecified: Secondary | ICD-10-CM

## 2015-01-14 DIAGNOSIS — Z79899 Other long term (current) drug therapy: Secondary | ICD-10-CM

## 2015-01-14 DIAGNOSIS — I10 Essential (primary) hypertension: Secondary | ICD-10-CM

## 2015-01-14 DIAGNOSIS — D649 Anemia, unspecified: Secondary | ICD-10-CM

## 2015-01-14 DIAGNOSIS — I2510 Atherosclerotic heart disease of native coronary artery without angina pectoris: Secondary | ICD-10-CM

## 2015-01-14 DIAGNOSIS — C61 Malignant neoplasm of prostate: Secondary | ICD-10-CM

## 2015-01-14 DIAGNOSIS — E1129 Type 2 diabetes mellitus with other diabetic kidney complication: Secondary | ICD-10-CM

## 2015-01-14 DIAGNOSIS — E782 Mixed hyperlipidemia: Secondary | ICD-10-CM

## 2015-01-14 LAB — CBC WITH DIFFERENTIAL/PLATELET
BASOS ABS: 0 10*3/uL (ref 0.0–0.1)
Basophils Relative: 1 % (ref 0–1)
EOS ABS: 0.2 10*3/uL (ref 0.0–0.7)
EOS PCT: 6 % — AB (ref 0–5)
HCT: 48.3 % (ref 39.0–52.0)
Hemoglobin: 15.9 g/dL (ref 13.0–17.0)
Lymphocytes Relative: 40 % (ref 12–46)
Lymphs Abs: 1.5 10*3/uL (ref 0.7–4.0)
MCH: 25.9 pg — AB (ref 26.0–34.0)
MCHC: 32.9 g/dL (ref 30.0–36.0)
MCV: 78.5 fL (ref 78.0–100.0)
MPV: 10.7 fL (ref 8.6–12.4)
Monocytes Absolute: 0.2 10*3/uL (ref 0.1–1.0)
Monocytes Relative: 6 % (ref 3–12)
Neutro Abs: 1.8 10*3/uL (ref 1.7–7.7)
Neutrophils Relative %: 47 % (ref 43–77)
Platelets: 183 10*3/uL (ref 150–400)
RBC: 6.15 MIL/uL — ABNORMAL HIGH (ref 4.22–5.81)
RDW: 16 % — ABNORMAL HIGH (ref 11.5–15.5)
WBC: 3.8 10*3/uL — AB (ref 4.0–10.5)

## 2015-01-14 LAB — IRON AND TIBC
%SAT: 19 % — ABNORMAL LOW (ref 20–55)
IRON: 66 ug/dL (ref 42–165)
TIBC: 353 ug/dL (ref 215–435)
UIBC: 287 ug/dL (ref 125–400)

## 2015-01-14 LAB — HEPATIC FUNCTION PANEL
ALK PHOS: 50 U/L (ref 39–117)
ALT: 21 U/L (ref 0–53)
AST: 24 U/L (ref 0–37)
Albumin: 4.8 g/dL (ref 3.5–5.2)
BILIRUBIN INDIRECT: 0.4 mg/dL (ref 0.2–1.2)
Bilirubin, Direct: 0.2 mg/dL (ref 0.0–0.3)
Total Bilirubin: 0.6 mg/dL (ref 0.2–1.2)
Total Protein: 7.9 g/dL (ref 6.0–8.3)

## 2015-01-14 LAB — FERRITIN: Ferritin: 142 ng/mL (ref 22–322)

## 2015-01-14 LAB — LIPID PANEL
CHOL/HDL RATIO: 3.3 ratio
Cholesterol: 164 mg/dL (ref 0–200)
HDL: 49 mg/dL (ref >=40)
LDL Cholesterol: 97 mg/dL (ref 0–99)
TRIGLYCERIDES: 89 mg/dL (ref <150)
VLDL: 18 mg/dL (ref 0–40)

## 2015-01-14 LAB — BASIC METABOLIC PANEL WITH GFR
BUN: 11 mg/dL (ref 6–23)
CO2: 28 mEq/L (ref 19–32)
Calcium: 10.3 mg/dL (ref 8.4–10.5)
Chloride: 104 mEq/L (ref 96–112)
Creat: 1.16 mg/dL (ref 0.50–1.35)
GFR, EST AFRICAN AMERICAN: 71 mL/min
GFR, EST NON AFRICAN AMERICAN: 62 mL/min
Glucose, Bld: 129 mg/dL — ABNORMAL HIGH (ref 70–99)
POTASSIUM: 3.5 meq/L (ref 3.5–5.3)
Sodium: 141 mEq/L (ref 135–145)

## 2015-01-14 LAB — HEMOGLOBIN A1C
Hgb A1c MFr Bld: 6.3 % — ABNORMAL HIGH (ref <5.7)
MEAN PLASMA GLUCOSE: 134 mg/dL — AB (ref <117)

## 2015-01-14 LAB — TSH: TSH: 1.637 u[IU]/mL (ref 0.350–4.500)

## 2015-01-14 LAB — VITAMIN B12: VITAMIN B 12: 627 pg/mL (ref 211–911)

## 2015-01-14 LAB — MAGNESIUM: MAGNESIUM: 2 mg/dL (ref 1.5–2.5)

## 2015-01-14 MED ORDER — PREDNISONE 20 MG PO TABS: ORAL_TABLET | ORAL | Status: DC

## 2015-01-14 NOTE — Patient Instructions (Signed)
Check your detergent, soap, and dryer sheets to make sure not changed.   We will eliminate meds 1 x 1, stop for 1 week at a time and see if helps 1_ Stop B6 supplement 2) decrease celexa to every other day 3) Cut zocor in half x 2 weeks then every other day  Rash A rash is a change in the color or texture of your skin. There are many different types of rashes. You may have other problems that accompany your rash. CAUSES   Infections.  Allergic reactions. This can include allergies to pets or foods.  Certain medicines.  Exposure to certain chemicals, soaps, or cosmetics.  Heat.  Exposure to poisonous plants.  Tumors, both cancerous and noncancerous. SYMPTOMS   Redness.  Scaly skin.  Itchy skin.  Dry or cracked skin.  Bumps.  Blisters.  Pain. DIAGNOSIS  Your caregiver may do a physical exam to determine what type of rash you have. A skin sample (biopsy) may be taken and examined under a microscope. TREATMENT  Treatment depends on the type of rash you have. Your caregiver may prescribe certain medicines. For serious conditions, you may need to see a skin doctor (dermatologist). HOME CARE INSTRUCTIONS   Avoid the substance that caused your rash.  Do not scratch your rash. This can cause infection.  You may take cool baths to help stop itching.  Only take over-the-counter or prescription medicines as directed by your caregiver.  Keep all follow-up appointments as directed by your caregiver. SEEK IMMEDIATE MEDICAL CARE IF:  You have increasing pain, swelling, or redness.  You have a fever.  You have new or severe symptoms.  You have body aches, diarrhea, or vomiting.  Your rash is not better after 3 days. MAKE SURE YOU:  Understand these instructions.  Will watch your condition.  Will get help right away if you are not doing well or get worse. Document Released: 10/29/2002 Document Revised: 01/31/2012 Document Reviewed: 08/23/2011 [facility] Patient  Information 2015 [place], [place]. This information is not intended to replace advice given to you by your health care provider. Make sure you discuss any questions you have with your health care provider.

## 2015-01-14 NOTE — Progress Notes (Signed)
Assessment and Plan:  Hypertension: Continue medication, monitor blood pressure at home. Continue DASH diet.  Reminder to go to the ER if any CP, SOB, nausea, dizziness, severe HA, changes vision/speech, left arm numbness and tingling and jaw pain. Cholesterol: Continue diet and exercise. Check cholesterol.  Diabetes with diabetic chronic kidney disease-Continue diet and exercise. Check A1C Vitamin D Def- check level and continue medications.  Rash/Dermatitis- continue zyrtec- predniosone- check detgergent etc at home- check labs- ? From B6, Zocor, celexa, norvasc- will eliminate 1 by 1.   Continue diet and meds as discussed. Further disposition pending results of labs. Discussed med's effects and SE's.    HPI 74 y.o. male  presents for 3 month follow up with hypertension, hyperlipidemia, diabetes and vitamin D, prostate CA.  His blood pressure has been controlled at home, today their BP is BP: 130/78 mmHg.  He does workout. He denies chest pain, shortness of breath, dizziness.  He is on cholesterol medication and denies myalgias. His cholesterol is at goal. The cholesterol was:  09/17/2014: Cholesterol, Total 149; HDL Cholesterol by NMR 42; LDL (calc) 93; Triglycerides 69  He has been working on diet and exercise for diabetes with diabetic chronic kidney disease, he is on bASA, he is on ACE/ARB, and denies  paresthesia of the feet, polydipsia, polyuria and visual disturbances. Last A1C was: 09/17/2014: Hemoglobin-A1c 6.1*  Patient is on Vitamin D supplement. 09/17/2014: Vit D, 25-Hydroxy 110*  Patient for past month complains of purists on chest, shoulders, Ab, back, and top of thighs. Zyrtec has helped, taking twice a day. Denies change in meds, denies soap/detergent change. Tried to get of KCL for a week without help. Tried to stop vitamin D for a week without help. Heat does make the itching worse, there is no rash except when he scratches.    Current Medications:  Current Outpatient  Prescriptions on File Prior to Visit  Medication Sig Dispense Refill  . amLODipine (NORVASC) 5 MG tablet Take 5 mg by mouth daily.    Marland Kitchen aspirin 81 MG tablet Take 81 mg by mouth daily.    Marland Kitchen atenolol (TENORMIN) 100 MG tablet take 1 tablet by mouth once daily for blood pressure 90 tablet 1  . citalopram (CELEXA) 40 MG tablet Take 20 mg by mouth daily.      Marland Kitchen losartan-hydrochlorothiazide (HYZAAR) 100-25 MG per tablet take 1 tablet by mouth once daily 30 tablet 5  . nitroGLYCERIN (NITROSTAT) 0.4 MG SL tablet Place 0.4 mg under the tongue every 5 (five) minutes as needed.      . potassium chloride SA (K-DUR,KLOR-CON) 20 MEQ tablet take 1 tablet by mouth twice a day 60 tablet 3  . propranolol (INDERAL) 10 MG tablet Take 10 mg by mouth as needed. For anxiety    . pyridOXINE (VITAMIN B-6) 50 MG tablet Take 50 mg by mouth daily.      . risperiDONE (RISPERDAL) 2 MG tablet Take 1 mg by mouth at bedtime.     . simvastatin (ZOCOR) 80 MG tablet Take 40 mg by mouth at bedtime.      . Tamsulosin HCl (FLOMAX) 0.4 MG CAPS Take 0.4 mg by mouth daily.      Marland Kitchen zolpidem (AMBIEN) 10 MG tablet take 1 tablet by mouth at bedtime 30 tablet 4   No current facility-administered medications on file prior to visit.   Medical History:  Past Medical History  Diagnosis Date  . History of colonic polyps   . Diverticulitis, colon   .  CAD (coronary artery disease)   . Adenocarcinoma of prostate     s/p seed implantation  . Hyperlipidemia   . HTN (hypertension)   . Radiation proctitis     with chronic bleeding  . Axillary abscess     left  . Arthritis    Allergies:  Allergies  Allergen Reactions  . Viagra [Sildenafil Citrate]      Review of Systems:  Review of Systems  Constitutional: Negative.   HENT: Negative.   Eyes: Negative.   Respiratory: Negative.   Cardiovascular: Negative.   Gastrointestinal: Negative.   Genitourinary: Negative.   Musculoskeletal: Negative.   Skin: Positive for itching and rash.   Neurological: Negative.   Endo/Heme/Allergies: Negative.   Psychiatric/Behavioral: Negative.     Family history- Review and unchanged Social history- Review and unchanged Physical Exam: BP 130/78 mmHg  Pulse 52  Temp(Src) 98 F (36.7 C)  Resp 16  Ht 5\' 10"  (1.778 m)  Wt 202 lb (91.627 kg)  BMI 28.98 kg/m2 Wt Readings from Last 3 Encounters:  01/14/15 202 lb (91.627 kg)  11/20/14 197 lb (89.359 kg)  11/12/14 200 lb (90.719 kg)   General Appearance: Well nourished, in no apparent distress. Eyes: PERRLA, EOMs, conjunctiva no swelling or erythema Sinuses: No Frontal/maxillary tenderness ENT/Mouth: Ext aud canals clear, TMs without erythema, bulging. No erythema, swelling, or exudate on post pharynx.  Tonsils not swollen or erythematous. Hearing normal.  Neck: Supple, thyroid normal.  Respiratory: Respiratory effort normal, BS equal bilaterally without rales, rhonchi, wheezing or stridor.  Cardio: RRR with no MRGs. Brisk peripheral pulses without edema.  Abdomen: Soft, + BS.  Non tender, no guarding, rebound, hernias, masses. Lymphatics: Non tender without lymphadenopathy.  Musculoskeletal: Full ROM, 5/5 strength, Normal gait Skin: Warm, dry with excoriations on back and very fine papules on chest and back without lesions, ecchymosis.  Neuro: Cranial nerves intact. No cerebellar symptoms.  Psych: Awake and oriented X 3, normal affect, Insight and Judgment appropriate.    Vicie Mutters, PA-C 10:57 AM [facility]

## 2015-01-15 LAB — VITAMIN D 25 HYDROXY (VIT D DEFICIENCY, FRACTURES): VIT D 25 HYDROXY: 72 ng/mL (ref 30–100)

## 2015-01-16 ENCOUNTER — Other Ambulatory Visit: Payer: Self-pay | Admitting: Internal Medicine

## 2015-03-17 ENCOUNTER — Other Ambulatory Visit: Payer: Self-pay | Admitting: Internal Medicine

## 2015-04-17 ENCOUNTER — Other Ambulatory Visit: Payer: Self-pay | Admitting: *Deleted

## 2015-04-17 MED ORDER — ATENOLOL 100 MG PO TABS: ORAL_TABLET | ORAL | Status: DC

## 2015-04-18 ENCOUNTER — Ambulatory Visit: Payer: Self-pay | Admitting: Internal Medicine

## 2015-04-28 ENCOUNTER — Encounter: Payer: Self-pay | Admitting: Internal Medicine

## 2015-04-28 ENCOUNTER — Ambulatory Visit (INDEPENDENT_AMBULATORY_CARE_PROVIDER_SITE_OTHER): Payer: Medicare Other | Admitting: Internal Medicine

## 2015-04-28 VITALS — BP 138/80 | HR 56 | Temp 97.7°F | Resp 16 | Ht 70.0 in | Wt 200.4 lb

## 2015-04-28 DIAGNOSIS — I10 Essential (primary) hypertension: Secondary | ICD-10-CM

## 2015-04-28 DIAGNOSIS — E782 Mixed hyperlipidemia: Secondary | ICD-10-CM

## 2015-04-28 DIAGNOSIS — Z79899 Other long term (current) drug therapy: Secondary | ICD-10-CM

## 2015-04-28 DIAGNOSIS — E559 Vitamin D deficiency, unspecified: Secondary | ICD-10-CM

## 2015-04-28 DIAGNOSIS — E1121 Type 2 diabetes mellitus with diabetic nephropathy: Secondary | ICD-10-CM

## 2015-04-28 LAB — BASIC METABOLIC PANEL WITH GFR
BUN: 13 mg/dL (ref 6–23)
CO2: 27 meq/L (ref 19–32)
CREATININE: 1.17 mg/dL (ref 0.50–1.35)
Calcium: 9.9 mg/dL (ref 8.4–10.5)
Chloride: 102 mEq/L (ref 96–112)
GFR, Est African American: 71 mL/min
GFR, Est Non African American: 61 mL/min
Glucose, Bld: 158 mg/dL — ABNORMAL HIGH (ref 70–99)
Potassium: 3.7 mEq/L (ref 3.5–5.3)
SODIUM: 140 meq/L (ref 135–145)

## 2015-04-28 LAB — CBC WITH DIFFERENTIAL/PLATELET
Basophils Absolute: 0 10*3/uL (ref 0.0–0.1)
Basophils Relative: 1 % (ref 0–1)
Eosinophils Absolute: 0.3 10*3/uL (ref 0.0–0.7)
Eosinophils Relative: 7 % — ABNORMAL HIGH (ref 0–5)
HCT: 47 % (ref 39.0–52.0)
Hemoglobin: 15.3 g/dL (ref 13.0–17.0)
Lymphocytes Relative: 40 % (ref 12–46)
Lymphs Abs: 1.7 10*3/uL (ref 0.7–4.0)
MCH: 25.8 pg — ABNORMAL LOW (ref 26.0–34.0)
MCHC: 32.6 g/dL (ref 30.0–36.0)
MCV: 79.4 fL (ref 78.0–100.0)
MPV: 10.4 fL (ref 8.6–12.4)
Monocytes Absolute: 0.3 10*3/uL (ref 0.1–1.0)
Monocytes Relative: 7 % (ref 3–12)
NEUTROS PCT: 45 % (ref 43–77)
Neutro Abs: 1.9 10*3/uL (ref 1.7–7.7)
Platelets: 167 10*3/uL (ref 150–400)
RBC: 5.92 MIL/uL — ABNORMAL HIGH (ref 4.22–5.81)
RDW: 16.1 % — ABNORMAL HIGH (ref 11.5–15.5)
WBC: 4.3 10*3/uL (ref 4.0–10.5)

## 2015-04-28 LAB — LIPID PANEL
Cholesterol: 150 mg/dL (ref 0–200)
HDL: 48 mg/dL (ref >=40)
LDL CALC: 86 mg/dL (ref 0–99)
Total CHOL/HDL Ratio: 3.1 Ratio
Triglycerides: 81 mg/dL (ref <150)
VLDL: 16 mg/dL (ref 0–40)

## 2015-04-28 LAB — HEMOGLOBIN A1C
HEMOGLOBIN A1C: 6.3 % — AB (ref <5.7)
Mean Plasma Glucose: 134 mg/dL — ABNORMAL HIGH (ref <117)

## 2015-04-28 LAB — HEPATIC FUNCTION PANEL
ALBUMIN: 4.4 g/dL (ref 3.5–5.2)
ALT: 20 U/L (ref 0–53)
AST: 21 U/L (ref 0–37)
Alkaline Phosphatase: 42 U/L (ref 39–117)
BILIRUBIN DIRECT: 0.2 mg/dL (ref 0.0–0.3)
Indirect Bilirubin: 0.6 mg/dL (ref 0.2–1.2)
TOTAL PROTEIN: 6.8 g/dL (ref 6.0–8.3)
Total Bilirubin: 0.8 mg/dL (ref 0.2–1.2)

## 2015-04-28 LAB — TSH: TSH: 2.076 u[IU]/mL (ref 0.350–4.500)

## 2015-04-28 LAB — MAGNESIUM: MAGNESIUM: 2 mg/dL (ref 1.5–2.5)

## 2015-04-28 NOTE — Patient Instructions (Signed)

## 2015-04-28 NOTE — Progress Notes (Signed)
Patient ID: [name] Colon, male   DOB: [birth year], 74 y.o.   MRN: [id number]   This very nice 74 y.o. 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    Patient is treated for HTN circa 1990 & BP has been controlled at home. Today's BP: 138/80 mmHg. Patient also has ASCAD and had PTCA/Stent in 2003 by Dr Percival Spanish. In 2014 he had a Negative ETT. Patient has had no complaints of any cardiac type chest pain, palpitations, dyspnea/orthopnea/PND, dizziness, claudication, or dependent edema.   Hyperlipidemia is controlled with diet & meds. Patient denies myalgias or other med SE's. Last Lipids were at goal - Total Chol 164; HDL 49; LDL 97; Trig 89 on 01/14/2015.   Also, the patient has history of T2_NIDDM since 2003 w/ Stage 2 CKD (GFR 75) and has had no symptoms of reactive hypoglycemia, diabetic polys, paresthesias or visual blurring.  Last A1c was 6.3% on 01/14/2015.    Further, the patient also has history of Vitamin D Deficiency of 38 in 2012 and supplements vitamin D without any suspected side-effects. Last vitamin D was 72 on 01/14/2015.  Medication Sig  . amLODipine (NORVASC) 5 MG tablet Take 5 mg by mouth daily.  Marland Kitchen aspirin 81 MG tablet Take 81 mg by mouth daily.  Marland Kitchen atenolol (TENORMIN) 100 MG tablet take 1 tablet by mouth once daily for blood pressure  . citalopram (CELEXA) 40 MG tablet Take 20 mg by mouth daily.    Marland Kitchen losartan-hctz (HYZAAR) 100-25 MG  take 1 tablet by mouth once daily  . NITROSTAT 0.4 MG SL tablet Place 0.4 mg under the tongue every 5 (five) minutes as needed.    . KCl 20 MEQ tablet take 1 tablet by mouth twice a day  . propranolol (INDERAL) 10 MG tablet Take 10 mg by mouth as needed. For anxiety  . pyridOXINE (VITAMIN B-6) 50 MG tablet Take 50 mg by mouth daily.    . risperiDONE (RISPERDAL) 2 MG tablet Take 1 mg by mouth at bedtime.   . simvastatin (ZOCOR) 80 MG tablet Take 40 mg by mouth  at bedtime.    . Tamsulosin HCl (FLOMAX) 0.4 MG  Take 0.4 mg by mouth daily.    Marland Kitchen zolpidem (AMBIEN) 10 MG tablet take 1 tablet by mouth at bedtime   Allergies  Allergen Reactions  . Viagra [Sildenafil Citrate]    PMHx:   Past Medical History  Diagnosis Date  . History of colonic polyps   . Diverticulitis, colon   . CAD (coronary artery disease)   . Adenocarcinoma of prostate     s/p seed implantation  . Hyperlipidemia   . HTN (hypertension)   . Radiation proctitis     with chronic bleeding  . Axillary abscess     left  . Arthritis    Immunization History  Administered Date(s) Administered  . Influenza-Unspecified 08/22/2014  . Td 11/22/2006   Past Surgical History  Procedure Laterality Date  . Coronary angioplasty with stent placement  04/2008  . Colonoscopy w/ polypectomy     FHx:    Reviewed / unchanged  SHx:    Reviewed / unchanged  Systems Review:  Constitutional: Denies fever, chills, wt changes, headaches, insomnia, fatigue, night sweats, change in appetite. Eyes: Denies redness, blurred vision, diplopia, discharge, itchy, watery eyes.  ENT: Denies discharge, congestion, post nasal drip, epistaxis, sore throat, earache, hearing loss, dental pain, tinnitus, vertigo, sinus pain,  snoring.  CV: Denies chest pain, palpitations, irregular heartbeat, syncope, dyspnea, diaphoresis, orthopnea, PND, claudication or edema. Respiratory: denies cough, dyspnea, DOE, pleurisy, hoarseness, laryngitis, wheezing.  Gastrointestinal: Denies dysphagia, odynophagia, heartburn, reflux, water brash, abdominal pain or cramps, nausea, vomiting, bloating, diarrhea, constipation, hematemesis, melena, hematochezia  or hemorrhoids. Genitourinary: Denies dysuria, frequency, urgency, nocturia, hesitancy, discharge, hematuria or flank pain. Musculoskeletal: Denies arthralgias, myalgias, stiffness, jt. swelling, pain, limping or strain/sprain.  Skin: Denies pruritus, rash, hives, warts, acne,  eczema or change in skin lesion(s). Neuro: No weakness, tremor, incoordination, spasms, paresthesia or pain. Psychiatric: Denies confusion, memory loss or sensory loss. Endo: Denies change in weight, skin or hair change.  Heme/Lymph: No excessive bleeding, bruising or enlarged lymph nodes.  Physical Exam  BP 138/80   Pulse 56  Temp97.7 F   Resp 16  Ht 5\' 10"    Wt 200 lb 6.4 oz     BMI 28.75   Appears well nourished and in no distress. Eyes: PERRLA, EOMs, conjunctiva no swelling or erythema. Sinuses: No frontal/maxillary tenderness ENT/Mouth: EAC's clear, TM's nl w/o erythema, bulging. Nares clear w/o erythema, swelling, exudates. Oropharynx clear without erythema or exudates. Oral hygiene is good. Tongue normal, non obstructing. Hearing intact.  Neck: Supple. Thyroid nl. Car 2+/2+ without bruits, nodes or JVD. Chest: Respirations nl with BS clear & equal w/o rales, rhonchi, wheezing or stridor.  Cor: Heart sounds normal w/ regular rate and rhythm without sig. murmurs, gallops, clicks, or rubs. Peripheral pulses normal and equal  without edema.  Abdomen: Soft & bowel sounds normal. Non-tender w/o guarding, rebound, hernias, masses, or organomegaly.  Lymphatics: Unremarkable.  Musculoskeletal: Full ROM all peripheral extremities, joint stability, 5/5 strength, and normal gait.  Skin: Warm, dry without exposed rashes, lesions or ecchymosis apparent.  Neuro: Cranial nerves intact, reflexes equal bilaterally. Sensory-motor testing grossly intact. Tendon reflexes grossly intact.  Pysch: Alert & oriented x 3.  Insight and judgement nl & appropriate. No ideations.  Assessment and Plan:  1. Essential hypertension  - TSH  2. Hyperlipidemia  - Lipid panel  3. Type 2 diabetes mellitus with diabetic nephropathy  - Hemoglobin A1c - Insulin, random  4. Vitamin D deficiency  - Vit D  25 hydroxy (rtn osteoporosis monitoring)  5. Medication management  - CBC with  Differential/Platelet - BASIC METABOLIC PANEL WITH GFR - Hepatic function panel - Magnesium   Recommended regular exercise, BP monitoring, weight control, and discussed med and SE's. Recommended labs to assess and monitor clinical status. Further disposition pending results of labs. Over 30 minutes of exam, counseling, chart review was performed

## 2015-04-29 LAB — VITAMIN D 25 HYDROXY (VIT D DEFICIENCY, FRACTURES): VIT D 25 HYDROXY: 56 ng/mL (ref 30–100)

## 2015-04-29 LAB — INSULIN, RANDOM: INSULIN: 119.6 u[IU]/mL — AB (ref 2.0–19.6)

## 2015-05-19 ENCOUNTER — Other Ambulatory Visit: Payer: Self-pay

## 2015-05-26 ENCOUNTER — Other Ambulatory Visit: Payer: Self-pay | Admitting: Physician Assistant

## 2015-06-10 ENCOUNTER — Encounter: Payer: Self-pay | Admitting: Internal Medicine

## 2015-06-10 ENCOUNTER — Ambulatory Visit (INDEPENDENT_AMBULATORY_CARE_PROVIDER_SITE_OTHER): Payer: Medicare Other | Admitting: Internal Medicine

## 2015-06-10 VITALS — BP 140/82 | HR 54 | Temp 98.2°F | Resp 16 | Ht 70.0 in | Wt 200.0 lb

## 2015-06-10 DIAGNOSIS — I2510 Atherosclerotic heart disease of native coronary artery without angina pectoris: Secondary | ICD-10-CM | POA: Diagnosis not present

## 2015-06-10 DIAGNOSIS — R609 Edema, unspecified: Secondary | ICD-10-CM

## 2015-06-10 MED ORDER — FUROSEMIDE 40 MG PO TABS: 40.0000 mg | ORAL_TABLET | Freq: Every day | ORAL | Status: DC

## 2015-06-10 NOTE — Progress Notes (Signed)
   Subjective:    Patient ID: [name] Colon, male    DOB: September 22, 1941, 74 y.o.   MRN: [id number]  HPI  Patient presents to the office for evaluation of bilateral leg swelling for the past two weeks.  Patient reports that he the swelling has been going on for the past couple weeks and started to get better on Sunday.  He reports that he does have high blood pressure and feels that he needs a diuretic.  He has not had any pain with the swelling.  He does not have any PND, orthopnea, coughing, SOB, or wheezing.  He has not had trouble  With leg swelling in the past.  He has had some swelling though.  He reports that he did try elevating his legs with no relief.    Review of Systems  Constitutional: Negative for fever, chills and fatigue.  Respiratory: Negative for cough, chest tightness, shortness of breath and wheezing.   Cardiovascular: Positive for leg swelling. Negative for chest pain and palpitations.  Musculoskeletal: Negative for myalgias, joint swelling, arthralgias and gait problem.       Objective:   Physical Exam  Constitutional: He is oriented to person, place, and time. He appears well-developed and well-nourished. No distress.  HENT:  Head: Normocephalic.  Mouth/Throat: Oropharynx is clear and moist. No oropharyngeal exudate.  Eyes: Conjunctivae are normal. No scleral icterus.  Neck: Normal range of motion. Neck supple. No JVD present. No thyromegaly present.  Cardiovascular: Normal rate, regular rhythm, normal heart sounds and intact distal pulses.  Exam reveals no gallop and no friction rub.   No murmur heard. Trace pretibial edema bilaterally.  Non-pitting.  No calf tenderness to palpation bilaterally.  Pulmonary/Chest: Effort normal and breath sounds normal. No respiratory distress. He has no wheezes. He has no rales. He exhibits no tenderness.  Musculoskeletal: Normal range of motion.  Lymphadenopathy:    He has no cervical adenopathy.  Neurological: He is alert and  oriented to person, place, and time.  Skin: Skin is warm and dry. He is not diaphoretic.  Psychiatric: He has a normal mood and affect. His behavior is normal. Judgment and thought content normal.  Nursing note and vitals reviewed.         Assessment & Plan:   1. Peripheral edema -minimal on exam today.  Possible early CHF vs. Venous insufficiency.  No systemic symptoms of CHF.   -elevate legs -avoid salt -lasix prn -return to office prn for SOB, PND, orthopnea -compression stockings.

## 2015-06-10 NOTE — Patient Instructions (Signed)
Please take lasix or furosemide only when your leg swelling is getting worse. Please work on elevating you legs above your heart and avoiding salt.  Compression stockings can be worn to help prevent some of the swelling. If you develop shortness of breath, wheezing, or any other concerning symptoms please let me know.   Varicose Veins Varicose veins are veins that have become enlarged and twisted. CAUSES This condition is the result of valves in the veins not working properly. Valves in the veins help return blood from the leg to the heart. When your calf muscles squeeze, the blood moves up your leg then the valves close and this continues until the blood gets back to your heart.  If these valves are damaged, blood flows backwards and backs up into the veins in the leg near the skin OR if your are sitting/standing for a long time without using your calf muscles the blood will back up into the veins in your legs. This causes the veins to become larger. People who are on their feet a lot, sit a lot without walking (like on a plane, at a desk, or in a car), who are pregnant, or who are overweight are more likely to develop varicose veins. SYMPTOMS   Bulging, twisted-appearing, bluish veins, most commonly found on the legs.  Leg pain or a feeling of heaviness. These symptoms may be worse at the end of the day.  Leg swelling.  Skin color changes. DIAGNOSIS  Varicose veins can usually be diagnosed with an exam of your legs by your caregiver. He or she may recommend an ultrasound of your leg veins. TREATMENT  Most varicose veins can be treated at home.However, other treatments are available for people who have persistent symptoms or who want to treat the cosmetic appearance of the varicose veins. But this is only cosmetic and they will return if not properly treated. These include:  Laser treatment of very small varicose veins.  Medicine that is shot (injected) into the vein. This medicine hardens  the walls of the vein and closes off the vein. This treatment is called sclerotherapy. Afterwards, you may need to wear clothing or bandages that apply pressure.  Surgery. HOME CARE INSTRUCTIONS   Do not stand or sit in one position for long periods of time. Do not sit with your legs crossed. Rest with your legs raised during the day.  Your legs have to be higher than your heart so that gravity will force the valves to open, so please really elevate your legs.   Wear elastic stockings or support hose. Do not wear other tight, encircling garments around the legs, pelvis, or waist.  [employer]  has a wide variety of well priced compression stockings. [place], [place] [id number] [id number] [place] ARE COPPER INFUSED COMPRESSION SOCKS AT [facility] OR [facility]  Walk as much as possible to increase blood flow.  Raise the foot of your bed at night with 2-inch blocks.  If you get a cut in the skin over the vein and the vein bleeds, lie down with your leg raised and press on it with a clean cloth until the bleeding stops. Then place a bandage (dressing) on the cut. See your caregiver if it continues to bleed or needs stitches. SEEK MEDICAL CARE IF:   The skin around your ankle starts to break down.  You have pain, redness, tenderness, or hard swelling developing in your leg over a vein.  You are uncomfortable  due to leg pain. Document Released: 08/18/2005 Document Revised: 01/31/2012 Document Reviewed: 01/04/2011 [facility] Patient Information 2014 [place].

## 2015-06-15 ENCOUNTER — Other Ambulatory Visit: Payer: Self-pay | Admitting: Internal Medicine

## 2015-07-15 ENCOUNTER — Other Ambulatory Visit: Payer: Self-pay | Admitting: Internal Medicine

## 2015-08-14 ENCOUNTER — Other Ambulatory Visit: Payer: Self-pay | Admitting: Internal Medicine

## 2015-08-26 ENCOUNTER — Other Ambulatory Visit: Payer: Self-pay | Admitting: Physician Assistant

## 2015-09-26 ENCOUNTER — Ambulatory Visit (INDEPENDENT_AMBULATORY_CARE_PROVIDER_SITE_OTHER): Payer: Medicare Other | Admitting: Internal Medicine

## 2015-09-26 ENCOUNTER — Encounter: Payer: Self-pay | Admitting: Internal Medicine

## 2015-09-26 VITALS — BP 118/74 | HR 64 | Temp 97.5°F | Resp 16 | Ht 70.25 in | Wt 202.2 lb

## 2015-09-26 DIAGNOSIS — Z1212 Encounter for screening for malignant neoplasm of rectum: Secondary | ICD-10-CM

## 2015-09-26 DIAGNOSIS — Z9181 History of falling: Secondary | ICD-10-CM

## 2015-09-26 DIAGNOSIS — I10 Essential (primary) hypertension: Secondary | ICD-10-CM | POA: Diagnosis not present

## 2015-09-26 DIAGNOSIS — Z6828 Body mass index (BMI) 28.0-28.9, adult: Secondary | ICD-10-CM

## 2015-09-26 DIAGNOSIS — Z6830 Body mass index (BMI) 30.0-30.9, adult: Secondary | ICD-10-CM

## 2015-09-26 DIAGNOSIS — E1121 Type 2 diabetes mellitus with diabetic nephropathy: Secondary | ICD-10-CM

## 2015-09-26 DIAGNOSIS — Z1389 Encounter for screening for other disorder: Secondary | ICD-10-CM | POA: Diagnosis not present

## 2015-09-26 DIAGNOSIS — Z1331 Encounter for screening for depression: Secondary | ICD-10-CM

## 2015-09-26 DIAGNOSIS — I2510 Atherosclerotic heart disease of native coronary artery without angina pectoris: Secondary | ICD-10-CM

## 2015-09-26 DIAGNOSIS — E669 Obesity, unspecified: Secondary | ICD-10-CM | POA: Insufficient documentation

## 2015-09-26 DIAGNOSIS — C61 Malignant neoplasm of prostate: Secondary | ICD-10-CM

## 2015-09-26 DIAGNOSIS — R6889 Other general symptoms and signs: Secondary | ICD-10-CM | POA: Diagnosis not present

## 2015-09-26 DIAGNOSIS — N492 Inflammatory disorders of scrotum: Secondary | ICD-10-CM

## 2015-09-26 DIAGNOSIS — Z0001 Encounter for general adult medical examination with abnormal findings: Secondary | ICD-10-CM

## 2015-09-26 DIAGNOSIS — F4310 Post-traumatic stress disorder, unspecified: Secondary | ICD-10-CM

## 2015-09-26 DIAGNOSIS — Z125 Encounter for screening for malignant neoplasm of prostate: Secondary | ICD-10-CM

## 2015-09-26 DIAGNOSIS — E559 Vitamin D deficiency, unspecified: Secondary | ICD-10-CM

## 2015-09-26 DIAGNOSIS — Z79899 Other long term (current) drug therapy: Secondary | ICD-10-CM

## 2015-09-26 DIAGNOSIS — E782 Mixed hyperlipidemia: Secondary | ICD-10-CM

## 2015-09-26 LAB — LIPID PANEL
CHOL/HDL RATIO: 3.8 ratio (ref <=5.0)
CHOLESTEROL: 153 mg/dL (ref 125–200)
HDL: 40 mg/dL (ref >=40)
LDL Cholesterol: 90 mg/dL (ref <130)
TRIGLYCERIDES: 116 mg/dL (ref <150)
VLDL: 23 mg/dL (ref <30)

## 2015-09-26 LAB — TSH: TSH: 1.62 u[IU]/mL (ref 0.350–4.500)

## 2015-09-26 LAB — CBC WITH DIFFERENTIAL/PLATELET
BASOS ABS: 0 10*3/uL (ref 0.0–0.1)
Basophils Relative: 0 % (ref 0–1)
EOS ABS: 0.2 10*3/uL (ref 0.0–0.7)
EOS PCT: 4 % (ref 0–5)
HCT: 43.2 % (ref 39.0–52.0)
Hemoglobin: 14.3 g/dL (ref 13.0–17.0)
LYMPHS ABS: 1.9 10*3/uL (ref 0.7–4.0)
Lymphocytes Relative: 38 % (ref 12–46)
MCH: 26 pg (ref 26.0–34.0)
MCHC: 33.1 g/dL (ref 30.0–36.0)
MCV: 78.4 fL (ref 78.0–100.0)
MPV: 10.1 fL (ref 8.6–12.4)
Monocytes Absolute: 0.4 10*3/uL (ref 0.1–1.0)
Monocytes Relative: 8 % (ref 3–12)
Neutro Abs: 2.5 10*3/uL (ref 1.7–7.7)
Neutrophils Relative %: 50 % (ref 43–77)
PLATELETS: 169 10*3/uL (ref 150–400)
RBC: 5.51 MIL/uL (ref 4.22–5.81)
RDW: 16 % — ABNORMAL HIGH (ref 11.5–15.5)
WBC: 4.9 10*3/uL (ref 4.0–10.5)

## 2015-09-26 LAB — BASIC METABOLIC PANEL WITH GFR
BUN: 12 mg/dL (ref 7–25)
CALCIUM: 9.9 mg/dL (ref 8.6–10.3)
CO2: 27 mmol/L (ref 20–31)
CREATININE: 1.11 mg/dL (ref 0.70–1.18)
Chloride: 101 mmol/L (ref 98–110)
GFR, EST NON AFRICAN AMERICAN: 65 mL/min (ref >=60)
GFR, Est African American: 75 mL/min (ref >=60)
Glucose, Bld: 130 mg/dL — ABNORMAL HIGH (ref 65–99)
Potassium: 3.5 mmol/L (ref 3.5–5.3)
SODIUM: 142 mmol/L (ref 135–146)

## 2015-09-26 LAB — HEPATIC FUNCTION PANEL
ALT: 20 U/L (ref 9–46)
AST: 24 U/L (ref 10–35)
Albumin: 4.4 g/dL (ref 3.6–5.1)
Alkaline Phosphatase: 52 U/L (ref 40–115)
BILIRUBIN DIRECT: 0.1 mg/dL (ref <=0.2)
BILIRUBIN TOTAL: 0.5 mg/dL (ref 0.2–1.2)
Indirect Bilirubin: 0.4 mg/dL (ref 0.2–1.2)
Total Protein: 7.3 g/dL (ref 6.1–8.1)

## 2015-09-26 LAB — MAGNESIUM: MAGNESIUM: 1.8 mg/dL (ref 1.5–2.5)

## 2015-09-26 LAB — HEMOGLOBIN A1C
Hgb A1c MFr Bld: 6.4 % — ABNORMAL HIGH (ref <5.7)
Mean Plasma Glucose: 137 mg/dL — ABNORMAL HIGH (ref <117)

## 2015-09-26 MED ORDER — DOXYCYCLINE HYCLATE 100 MG PO CAPS: ORAL_CAPSULE | ORAL | Status: AC

## 2015-09-26 NOTE — Progress Notes (Signed)
Patient ID: [name] Colon, male   DOB: 04-27-1941, 74 y.o.   MRN: [id number]  Medicare Annual Wellness Visit  And  Comprehensive Evaluation, Examination and Management   Assessment:   1. Essential hypertension  - Microalbumin / creatinine urine ratio - EKG 12-Lead - [place], RETROPERITNL ABD,  LTD - TSH  2. Hyperlipidemia  - Lipid panel  3. Type 2 diabetes mellitus with diabetic nephropathy, without long-term current use of insulin (HCC)  - Hemoglobin A1c - Insulin, random  4. Vitamin D deficiency  - Vit D  25 hydroxy   5. ASCAD s/p PTCA w/ Stents   6. PTSD (post-traumatic stress disorder)   7. ADENOCARCINOMA, PROSTATE  - PSA  8. Screening for rectal cancer  - declined Hemoccult cards  9. Prostate cancer screening   10. Depression screen   11. At low risk for fall   12. Scrotal abscess  - Recc Warm sitz baths  - Doxycycline 100 mg x 2 cap stat, then 1 cap bid x 15 da, then 1 cap qd x 15 da  13. Encounter for general adult medical examination with abnormal findings   14. Medication management  - Urinalysis, Routine w reflex microscopic (not at [facility]) - CBC with Differential/Platelet - BASIC METABOLIC PANEL WITH GFR - Hepatic function panel - Magnesium  15. BMI 28.0-28.9,adult  Plan:   During the course of the visit the patient was educated and counseled about appropriate screening and preventive services including:    Pneumococcal vaccine   Influenza vaccine  Td vaccine  Screening electrocardiogram  Bone densitometry screening  Colorectal cancer screening - 09/19/2014 - last Colonoscopy w/Dr Fuller Plan  Diabetes screening  Glaucoma screening - [facility]  Nutrition counseling   Advanced directives: Given forms for Healthcare POA and Advanced Directives  Screening recommendations, referrals: Vaccinations: Immunization History  Administered Date(s) Administered  . Influenza-Unspecified 08/22/2014, 09/03/2015  . Pneumococcal  Conjugate-13 09/03/2015  DT vaccine 2008 Influenza vaccine 09/03/2015 Pneumococcal vaccine deferred Shingles vaccine declined Hep B vaccine not indicated  Nutrition assessed and recommended  Colonoscopy 09/19/2014 Recommended yearly ophthalmology/optometry visit for glaucoma screening and checkup Recommended yearly dental visit for hygiene and checkup Advanced directives - undecided - given forms to review   Conditions/risks identified: BMI: Discussed weight loss, diet, and increase physical activity.  Increase physical activity: AHA recommends 150 minutes of physical activity a week.  Medications reviewed Diabetes is not at goal, ACE/ARB therapy: Yes. Urinary Incontinence is not an issue: discussed non pharmacology and pharmacology options.  Fall risk: low- discussed PT, home fall assessment, medications.   Subjective:      [name] Colon  presents for [employer] Visit and presents for a comprehensive evaluation, examination and management of multiple medical co-morbidities.  Date of last medicare wellness visit 09/17/2014.  This very nice 74 y.o. MBM presents for  follow up with Hypertension, Hyperlipidemia, Pre-Diabetes and Vitamin D Deficiency. 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      Patient is treated for HTN circa 1990 & BP has been controlled at home. Today's BP: 118/74 mmHg. .  In 2009, patient presented with ACS and had PTCA with Stents inserted. Patient has had no complaints of any cardiac type chest pain, palpitations, dyspnea/orthopnea/PND, dizziness, claudication, or dependent edema. Patient has done well since that time & continues close f/u with Dr Percival Spanish. Patient had a negative provocative Stress test in 2014.  Hyperlipidemia is controlled with diet & meds. Patient denies myalgias or other med SE's. Last Lipids were at goal with Cholesterol 150; HDL 48; LDL 86;  Triglycerides 81 on 04/28/2015.     Also, the patient has history of T2_NIDDM since 2003 and has maintained his A1c below 6.5% and hence stayed off of Diabetic meds and has had no symptoms of reactive hypoglycemia, diabetic polys, paresthesias or visual blurring.  Last A1c was 6.3% on 04/28/2015.      Further, the patient also has history of Vitamin D Deficiency of 38 in 2012 and supplements vitamin D without any suspected side-effects. Last vitamin D was 56 on 04/28/2015.  Names of Other Physician/Practitioners you currently use:  1. [facility] here for primary care. 2. Patient seen at the [facility] annually. 3. Patient is seen at the [place] [facility] annually. 4. Patient is followed quarterly at the [facility] clinics.  Patient Care Team: Unk Pinto, MD as PCP - General (Internal Medicine) Minus Breeding, MD as Consulting Physician (Cardiology) Irene Shipper, MD as Consulting Physician (Gastroenterology) Kathie Rhodes, MD as Consulting Physician (Urology)  Medication Review: Medication Sig  . amLODipine (NORVASC) 5 MG tablet Take 5 mg by mouth daily.  Marland Kitchen aspirin 81 MG tablet Take 81 mg by mouth daily.  Marland Kitchen atenolol (TENORMIN) 100 MG tablet take 1 tablet by mouth once daily for blood pressure  . citalopram (CELEXA) 40 MG tablet Take 20 mg by mouth daily.    . furosemide (LASIX) 40 MG tablet Take 1 tablet (40 mg total) by mouth daily.  Marland Kitchen losartan-hydrochlorothiazide (HYZAAR) 100-25 MG tablet take 1 tablet by mouth once daily  . nitroGLYCERIN (NITROSTAT) 0.4 MG SL tablet Place 0.4 mg under the tongue every 5 (five) minutes as needed.    . potassium chloride SA (K-DUR,KLOR-CON) 20 MEQ tablet take 1 tablet by mouth twice a day  . propranolol (INDERAL) 10 MG tablet Take 10 mg by mouth as needed. For anxiety  . pyridOXINE (VITAMIN B-6) 50 MG tablet Take 50 mg by mouth daily.    . risperiDONE (RISPERDAL) 2 MG tablet Take 1 mg by mouth at bedtime.   .  simvastatin (ZOCOR) 80 MG tablet Take 40 mg by mouth at bedtime.    . Tamsulosin HCl (FLOMAX) 0.4 MG CAPS Take 0.4 mg by mouth daily.    Marland Kitchen zolpidem (AMBIEN) 10 MG tablet take 1 tablet by mouth at bedtime   Allergies  Allergen Reactions  . Viagra [Sildenafil Citrate]    Current Problems (verified) Patient Active Problem List   Diagnosis Date Noted  . PTSD (post-traumatic stress disorder) 09/26/2015  . BMI 28.0-28.9,adult 09/26/2015  . Vitamin D deficiency 03/18/2014  . 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 Medication management 03/17/2014  . 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 ADENOCARCINOMA, PROSTATE 08/29/2008  . COLONIC POLYPS 08/29/2008  . Hyperlipidemia 08/29/2008  . Essential hypertension 08/29/2008  . Coronary atherosclerosis 08/29/2008  . DIVERTICULOSIS, COLON 08/29/2008   Screening Tests Health Maintenance  Topic Date Due  . OPHTHALMOLOGY EXAM  09/26/1950  . ZOSTAVAX  09/26/2000  . PNA vac Low Risk Adult (1 of 2 - PCV13) 09/26/2005  . INFLUENZA VACCINE  06/23/2015  . FOOT EXAM  09/18/2015  . URINE MICROALBUMIN  09/18/2015  . HEMOGLOBIN A1C  10/28/2015  . TETANUS/TDAP  11/22/2016  . COLONOSCOPY  09/19/2017   Immunization History  Administered Date(s) Administered  . Influenza-Unspecified 08/22/2014, 09/03/2015  . Pneumococcal Conjugate-13 09/03/2015  .  Td 11/22/2006   Preventative care: Last colonoscopy: 09/19/2014  Past Medical History  Diagnosis Date  . History of colonic polyps   . Diverticulitis, colon   . CAD (coronary artery disease)   . Adenocarcinoma of prostate ([place])     s/p seed implantation  . Hyperlipidemia   . HTN (hypertension)   . Radiation proctitis     with chronic bleeding  . Axillary abscess     left  . Arthritis    Past Surgical History  Procedure Laterality Date  . Coronary angioplasty with stent placement  04/2008  . Colonoscopy w/ polypectomy     Risk Factors: Tobacco Social History  Substance Use Topics  .  Smoking status: Former Smoker    Quit date: 11/22/1992  . Smokeless tobacco: Never Used  . Alcohol Use: No   He does not smoke.  Patient is a former smoker. Are there smokers in your home (other than you)?  No   Married x 40 years - has 2 daughters 71 & 80 yo, 4 GC & 1 GGC 5&1/2 yo.   Alcohol Current alcohol use: none  Caffeine Current caffeine use: infrequent  Exercise Current exercise: housecleaning and walking  Nutrition/Diet Current diet: in general, a "healthy" diet    Cardiac risk factors: advanced age (older than 12 for men, 83 for women), diabetes mellitus, dyslipidemia, family history of premature cardiovascular disease, hypertension, male gender, sedentary lifestyle and smoking/ tobacco exposure.  Depression Screen (Note: if answer to either of the following is "Yes", a more complete depression screening is indicated)   Q1: Over the past two weeks, have you felt down, depressed or hopeless? No  Q2: Over the past two weeks, have you felt little interest or pleasure in doing things? No  Have you lost interest or pleasure in daily life? No  Do you often feel hopeless? No  Do you cry easily over simple problems? No  Activities of Daily Living In your present state of health, do you have any difficulty performing the following activities?:  Driving? No Managing money?  No Feeding yourself? No Getting from bed to chair? No Climbing a flight of stairs? No Preparing food and eating?: No Bathing or showering? No Getting dressed: No Getting to the toilet? No Using the toilet:No Moving around from place to place: No In the past year have you fallen or had a near fall?:No   Are you sexually active?  No  Do you have more than one partner?  No  Vision Difficulties: No  Hearing Difficulties: No Do you often ask people to speak up or repeat themselves? No Do you experience ringing or noises in your ears? No Do you have difficulty understanding soft or whispered  voices? No  Cognition  Do you feel that you have a problem with memory?No  Do you often misplace items? No  Do you feel safe at home?  Yes  Advanced directives Does patient have a [place]? No - given forms to review Does patient have a Living Will? No - given forms to review  ROS: Constitutional: Denies fever, chills, weight loss/gain, headaches, insomnia, fatigue, night sweats or change in appetite. Eyes: Denies redness, blurred vision, diplopia, discharge, itchy or watery eyes.  ENT: Denies discharge, congestion, post nasal drip, epistaxis, sore throat, earache, hearing loss, dental pain, Tinnitus, Vertigo, Sinus pain or snoring.  Cardio: Denies chest pain, palpitations, irregular heartbeat, syncope, dyspnea, diaphoresis, orthopnea, PND, claudication or edema Respiratory: denies cough, dyspnea, DOE, pleurisy,  hoarseness, laryngitis or wheezing.  Gastrointestinal: Denies dysphagia, heartburn, reflux, water brash, pain, cramps, nausea, vomiting, bloating, diarrhea, constipation, hematemesis, melena, hematochezia, jaundice or hemorrhoids Genitourinary: Denies dysuria, frequency, urgency, nocturia, hesitancy, discharge, hematuria or flank pain Musculoskeletal: Denies arthralgia, myalgia, stiffness, Jt. Swelling, pain, limp or strain/sprain. Denies Falls. Skin: Denies puritis, rash, hives, warts, acne, eczema or change in skin lesion. C/o "boil" on scrotum x months and recently goten larger & draining. Neuro: No weakness, tremor, incoordination, spasms, paresthesia or pain Psychiatric: Denies confusion, memory loss or sensory loss. Denies Depression. Endocrine: Denies change in weight, skin, hair change, nocturia, and paresthesia, diabetic polys, visual blurring or hyper / hypo glycemic episodes.  Heme/Lymph: No excessive bleeding, bruising or enlarged lymph nodes.  Objective:     BP 118/74 mmHg  Pulse 64  Temp(Src) 97.5 F (36.4 C)  Resp 16  Ht 5' 10.25" (1.784 m)   Wt 202 lb 3.2 oz (91.717 kg)  BMI 28.82 kg/m2  General Appearance:  Alert  WD/WN, male  in no apparent distress. Eyes: PERRLA, EOMs nl, conjunctiva normal, normal fundi and vessels. Sinuses: No frontal/maxillary tenderness ENT/Mouth: EACs patent / TMs  nl. Nares clear without erythema, swelling, mucoid exudates. Oral hygiene is good. No erythema, swelling, or exudate. Tongue normal, non-obstructing. Tonsils not swollen or erythematous. Hearing normal.  Neck: Supple, thyroid normal. No bruits, nodes or JVD. Respiratory: Respiratory effort normal.  BS equal and clear bilateral without rales, rhonci, wheezing or stridor. Cardio: Heart sounds are normal with regular rate and rhythm and no murmurs, rubs or gallops. Peripheral pulses are normal and equal bilaterally without edema. No aortic or femoral bruits. Chest: symmetric with normal excursions and percussion.  Abdomen: Flat, soft, with nl bowel sounds. Nontender, no guarding, rebound, hernias, masses, or organomegaly.  Lymphatics: Non tender without lymphadenopathy.  Genitourinary: there is a fluctuant draining scrotal abscess at the rt cruroscrotal reflection. No hernias.Testes nl. DRE - prostatic fossa flat w/o nodularity - smooth & firm. Musculoskeletal: Full ROM all peripheral extremities, joint stability, 5/5 strength, and normal gait. Skin: Warm and dry without rashes, lesions, cyanosis, clubbing or  ecchymosis.  Neuro: Cranial nerves intact, reflexes equal bilaterally. Normal muscle tone, no cerebellar symptoms. Sensation intact.  Pysch: Alert and oriented X 3 with normal affect, insight and judgment appropriate.   Cognitive Testing  Alert? Yes  Normal Appearance? Yes  Oriented to person? Yes  Place? Yes   Time? Yes  Recall of three objects?  Yes  Can perform simple calculations? Yes  Displays appropriate judgment? Yes  Can read the correct time from a watch/clock? Yes  Medicare Attestation I have personally reviewed: The  patient's medical and social history Their use of alcohol, tobacco or illicit drugs Their current medications and supplements The patient's functional ability including ADLs,fall risks, home safety risks, cognitive, and hearing and visual impairment Diet and physical activities Evidence for depression or mood disorders  The patient's weight, height, BMI, and visual acuity have been recorded in the chart.  I have made referrals, counseling, and provided education to the patient based on review of the above and I have provided the patient with a written personalized care plan for preventive services.  Over 40 minutes of exam, counseling, chart review was performed.  Traeger Sultana DAVID, MD   09/26/2015

## 2015-09-26 NOTE — Patient Instructions (Signed)
Recommend Adult Low Dose Aspirin or   coated  Aspirin 81 mg daily   To reduce risk of Colon Cancer 20 %,   Skin Cancer 26 % ,   Melanoma 46%   and   Pancreatic cancer 60%   ++++++++++++++++++++++++++++++++++++++++++++++++++++++  Vitamin D goal   is between 70-100.   Please make sure that you are taking your Vitamin D as directed.   It is very important as a natural anti-inflammatory   helping hair, skin, and nails, as well as reducing stroke and heart attack risk.   It helps your bones and helps with mood.  It also decreases numerous cancer risks so please take it as directed.   Low Vit D is associated with a 200-300% higher risk for CANCER   and 200-300% higher risk for HEART   ATTACK  &  STROKE.   ......................................  It is also associated with higher death rate at younger ages,   autoimmune diseases like Rheumatoid arthritis, Lupus, Multiple Sclerosis.     Also many other serious conditions, like depression, Alzheimer's  Dementia, infertility, muscle aches, fatigue, fibromyalgia - just to name a few.  ++++++++++++++++++++++++++++++++++++++++++++++++  Recommend the book "The END of DIETING" by Dr Joel Fuhrman   & the book "The END of DIABETES " by Dr Joel Fuhrman  At [employer] - get book & Audio CD's     Being diabetic has a  300% increased risk for heart attack, stroke, cancer, and alzheimer- type vascular dementia. It is very important that you work harder with diet by avoiding all foods that are white. Avoid white rice (brown & wild rice is OK), white potatoes (sweetpotatoes in moderation is OK), White bread or wheat bread or anything made out of white flour like bagels, donuts, rolls, buns, biscuits, cakes, pastries, cookies, pizza crust, and pasta (made from white flour & egg whites) - vegetarian pasta or spinach or wheat pasta is OK. Multigrain breads like Arnold's or Pepperidge Farm, or multigrain sandwich thins or flatbreads.  Diet,  exercise and weight loss can reverse and cure diabetes in the early stages.  Diet, exercise and weight loss is very important in the control and prevention of complications of diabetes which affects every system in your body, ie. Brain - dementia/stroke, eyes - glaucoma/blindness, heart - heart attack/heart failure, kidneys - dialysis, stomach - gastric paralysis, intestines - malabsorption, nerves - severe painful neuritis, circulation - gangrene & loss of a leg(s), and finally cancer and Alzheimers.    I recommend avoid fried & greasy foods,  sweets/candy, white rice (brown or wild rice or Quinoa is OK), white potatoes (sweet potatoes are OK) - anything made from white flour - bagels, doughnuts, rolls, buns, biscuits,white and wheat breads, pizza crust and traditional pasta made of white flour & egg white(vegetarian pasta or spinach or wheat pasta is OK).  Multi-grain bread is OK - like multi-grain flat bread or sandwich thins. Avoid alcohol in excess. Exercise is also important.    Eat all the vegetables you want - avoid meat, especially red meat and dairy - especially cheese.  Cheese is the most concentrated form of trans-fats which is the worst thing to clog up our arteries. Veggie cheese is OK which can be found in the fresh produce section at [employer] or [employer] or [employer]  ++++++++++++++++++++++++++++++++++++++++++++++++++ DASH Eating Plan  DASH stands for "Dietary Approaches to Stop Hypertension."   The DASH eating plan is a healthy eating plan that has been shown to reduce high   blood pressure (hypertension). Additional health benefits [place] include reducing the risk of type 2 diabetes mellitus, heart disease, and stroke. The DASH eating plan Poet also help with weight loss.  WHAT DO I NEED TO KNOW ABOUT THE DASH EATING PLAN? For the DASH eating plan, you will follow these general guidelines:  Choose foods with a percent daily value for sodium of less than 5% (as listed on the food  label).  Use salt-free seasonings or herbs instead of table salt or sea salt.  Check with your health care provider or pharmacist before using salt substitutes.  Eat lower-sodium products, often labeled as "lower sodium" or "no salt added."  Eat fresh foods.  Eat more vegetables, fruits, and low-fat dairy products.    Choose whole grains. Look for the word "whole" as the first word in the ingredient list.  Choose fish   Limit sweets, desserts, sugars, and sugary drinks.  Choose heart-healthy fats.  Eat veggie cheese   Eat more home-cooked food and less restaurant, buffet, and fast food.  Limit fried foods.  Cook foods using methods other than frying.  Limit canned vegetables. If you do use them, rinse them well to decrease the sodium.  When eating at a restaurant, ask that your food be prepared with less salt, or no salt if possible.                      WHAT FOODS CAN I EAT?  Seek help from a dietitian for individual calorie needs. Grains Whole grain or whole wheat bread. Brown rice. Whole grain or whole wheat pasta. Quinoa, bulgur, and whole grain cereals. Low-sodium cereals. Corn or whole wheat flour tortillas. Whole grain cornbread. Whole grain crackers. Low-sodium crackers.  Vegetables Fresh or frozen vegetables (raw, steamed, roasted, or grilled). Low-sodium or reduced-sodium tomato and vegetable juices. Low-sodium or reduced-sodium tomato sauce and paste. Low-sodium or reduced-sodium canned vegetables.   Fruits All fresh, canned (in natural juice), or frozen fruits.  Meat and Other Protein Products  All fish and seafood.  Dried beans, peas, or lentils. Unsalted nuts and seeds. Unsalted canned beans. Dairy Low-fat dairy products, such as skim or 1% milk, 2% or reduced-fat cheeses, low-fat ricotta or cottage cheese, or plain low-fat yogurt. Low-sodium or reduced-sodium cheeses.  Fats and Oils Tub margarines without trans fats. Light or reduced-fat mayonnaise  and salad dressings (reduced sodium). Avocado. Safflower, olive, or canola oils. Natural peanut or almond butter.  Other Unsalted popcorn and pretzels. The items listed above [place] not be a complete list of recommended foods or beverages. Contact your dietitian for more options.  +++++++++++++++++++++++++++++++++++++++++++  WHAT FOODS ARE NOT RECOMMENDED?  Grains/ White flour or wheat flour  White bread. White pasta. White rice. Refined cornbread. Bagels and croissants. Crackers that contain trans fat.  Vegetables  Creamed or fried vegetables. Vegetables in a . Regular canned vegetables. Regular canned tomato sauce and paste. Regular tomato and vegetable juices.  Fruits Dried fruits. Canned fruit in light or heavy syrup. Fruit juice.  Meat and Other Protein Products Meat in general. Fatty cuts of meat. Ribs, chicken wings, bacon, sausage, bologna, salami, chitterlings, fatback, hot dogs, bratwurst, and packaged luncheon meats. Salted nuts and seeds. Canned beans with salt.  Dairy Whole or 2% milk, cream, half-and-half, and cream cheese. Whole-fat or sweetened yogurt. Full-fat cheeses or blue cheese. Nondairy creamers and whipped toppings. Processed cheese, cheese spreads, or cheese curds.  Condiments Onion and garlic salt, seasoned salt, table salt, and sea  salt. Canned and packaged gravies. Worcestershire sauce. Tartar sauce. Barbecue sauce. Teriyaki sauce. Soy sauce, including reduced sodium. Steak sauce. Fish sauce. Oyster sauce. Cocktail sauce. Horseradish. Ketchup and mustard. Meat flavorings and tenderizers. Bouillon cubes. Hot sauce. Tabasco sauce. Marinades. Taco seasonings. Relishes.  Fats and Oils Butter, stick margarine, lard, shortening, ghee, and bacon fat. Coconut, palm kernel, or palm oils. Regular salad dressings.  Pickles and olives. Salted popcorn and pretzels. The items listed above may not be a complete list of foods and beverages to avoid.  Preventative Care for  Adults - Male      MAINTAIN REGULAR HEALTH EXAMS:  A routine yearly physical is a good way to check in with your primary care provider about your health and preventive screening. It is also an opportunity to share updates about your health and any concerns you have, and receive a thorough all-over exam.   Most health insurance companies pay for at least some preventative services.  Check with your health plan for specific coverages.  WHAT PREVENTATIVE SERVICES DO WOMEN NEED?  Adult women should have their weight and blood pressure checked regularly.   Women age 82 and older should have their cholesterol levels checked regularly.  Women should be screened for cervical cancer with a Pap smear and pelvic exam beginning at either age 82, or 3 years after they become sexually activity.    Breast cancer screening generally begins at age 11 with a mammogram and breast exam by your primary care provider.    Beginning at age 24 and continuing to age 60, women should be screened for colorectal cancer.  Certain people may need continued testing until age 40.  Updating vaccinations is part of preventative care.  Vaccinations help protect against diseases such as the flu.  Osteoporosis is a disease in which the bones lose minerals and strength as we age. Women ages 70 and over should discuss this with their caregivers, as should women after menopause who have other risk factors.  Lab tests are generally done as part of preventative care to screen for anemia and blood disorders, to screen for problems with the kidneys and liver, to screen for bladder problems, to check blood sugar, and to check your cholesterol level.  Preventative services generally include counseling about diet, exercise, avoiding tobacco, drugs, excessive alcohol consumption, and sexually transmitted infections.    GENERAL RECOMMENDATIONS FOR GOOD HEALTH:  Healthy diet:  Eat a variety of foods, including fruit, vegetables,  animal or vegetable protein, such as meat, fish, chicken, and eggs, or beans, lentils, tofu, and grains, such as rice.  Drink plenty of water daily.  Decrease saturated fat in the diet, avoid lots of red meat, processed foods, sweets, fast foods, and fried foods.  Exercise:  Aerobic exercise helps maintain good heart health. At least 30-40 minutes of moderate-intensity exercise is recommended. For example, a brisk walk that increases your heart rate and breathing. This should be done on most days of the week.   Find a type of exercise or a variety of exercises that you enjoy so that it becomes a part of your daily life.  Examples are running, walking, swimming, water aerobics, and biking.  For motivation and support, explore group exercise such as aerobic class, spin class, Zumba, Yoga,or  martial arts, etc.    Set exercise goals for yourself, such as a certain weight goal, walk or run in a race such as a 5k walk/run.  Speak to your primary care provider about exercise goals.  Disease prevention:  If you smoke or chew tobacco, find out from your caregiver how to quit. It can literally save your life, no matter how long you have been a tobacco user. If you do not use tobacco, never begin.   Maintain a healthy diet and normal weight. Increased weight leads to problems with blood pressure and diabetes.   The Body Mass Index or BMI is a way of measuring how much of your body is fat. Having a BMI above 27 increases the risk of heart disease, diabetes, hypertension, stroke and other problems related to obesity. Your caregiver can help determine your BMI and based on it develop an exercise and dietary program to help you achieve or maintain this important measurement at a healthful level.  High blood pressure causes heart and blood vessel problems.  Persistent high blood pressure should be treated with medicine if weight loss and exercise do not work.   Fat and cholesterol leaves deposits in your  arteries that can block them. This causes heart disease and vessel disease elsewhere in your body.  If your cholesterol is found to be high, or if you have heart disease or certain other medical conditions, then you may need to have your cholesterol monitored frequently and be treated with medication.   Ask if you should have a cardiac stress test if your history suggests this. A stress test is a test done on a treadmill that looks for heart disease. This test can find disease prior to there being a problem.  Menopause can be associated with physical symptoms and risks. Hormone replacement therapy is available to decrease these. You should talk to your caregiver about whether starting or continuing to take hormones is right for you.   Osteoporosis is a disease in which the bones lose minerals and strength as we age. This can result in serious bone fractures. Risk of osteoporosis can be identified using a bone density scan. Women ages 55 and over should discuss this with their caregivers, as should women after menopause who have other risk factors. Ask your caregiver whether you should be taking a calcium supplement and Vitamin D, to reduce the rate of osteoporosis.   Avoid drinking alcohol in excess (more than two drinks per day).  Avoid use of street drugs. Do not share needles with anyone. Ask for professional help if you need assistance or instructions on stopping the use of alcohol, cigarettes, and/or drugs.  Brush your teeth twice a day with fluoride toothpaste, and floss once a day. Good oral hygiene prevents tooth decay and gum disease. The problems can be painful, unattractive, and can cause other health problems. Visit your dentist for a routine oral and dental check up and preventive care every 6-12 months.   Look at your skin regularly.  Use a mirror to look at your back. Notify your caregivers of changes in moles, especially if there are changes in shapes, colors, a size larger than a pencil  eraser, an irregular border, or development of new moles.  Safety:  Use seatbelts 100% of the time, whether driving or as a passenger.  Use safety devices such as hearing protection if you work in environments with loud noise or significant background noise.  Use safety glasses when doing any work that could send debris in to the eyes.  Use a helmet if you ride a bike or motorcycle.  Use appropriate safety gear for contact sports.  Talk to your caregiver about gun safety.  Use sunscreen with a  SPF (or skin protection factor) of 15 or greater.  Lighter skinned people are at a greater risk of skin cancer. Don't forget to also wear sunglasses in order to protect your eyes from too much damaging sunlight. Damaging sunlight can accelerate cataract formation.   Practice safe sex. Use condoms. Condoms are used for birth control and to help reduce the spread of sexually transmitted infections (or STIs).  Some of the STIs are gonorrhea (the clap), chlamydia, syphilis, trichomonas, herpes, HPV (human papilloma virus) and HIV (human immunodeficiency virus) which causes AIDS. The herpes, HIV and HPV are viral illnesses that have no cure. These can result in disability, cancer and death.   Keep carbon monoxide and smoke detectors in your home functioning at all times. Change the batteries every 6 months or use a model that plugs into the wall.   Vaccinations:  Stay up to date with your tetanus shots and other required immunizations. You should have a booster for tetanus every 10 years. Be sure to get your flu shot every year, since 5%-20% of the [place] population comes down with the flu. The flu vaccine changes each year, so being vaccinated once is not enough. Get your shot in the fall, before the flu season peaks.   Other vaccines to consider:  Human Papilloma Virus or HPV causes cancer of the cervix, and other infections that can be transmitted from person to person. There is a vaccine for HPV, and females  should get immunized between the ages of 40 and 27. It requires a series of 3 shots.   Pneumococcal vaccine to protect against certain types of pneumonia.  This is normally recommended for adults age 47 or older.  However, adults younger than 75 years old with certain underlying conditions such as diabetes, heart or lung disease should also receive the vaccine.  Shingles vaccine to protect against Varicella Zoster if you are older than age 21, or younger than 75 years old with certain underlying illness.  Hepatitis A vaccine to protect against a form of infection of the liver by a virus acquired from food.  Hepatitis B vaccine to protect against a form of infection of the liver by a virus acquired from blood or body fluids, particularly if you work in health care.  If you plan to travel internationally, check with your local health department for specific vaccination recommendations.  Cancer Screening:  Breast cancer screening is essential to preventive care for women. All women age 78 and older should perform a breast self-exam every month. At age 76 and older, women should have their caregiver complete a breast exam each year. Women at ages 9 and older should have a mammogram (x-ray film) of the breasts. Your caregiver can discuss how often you need mammograms.    Cervical cancer screening includes taking a Pap smear (sample of cells examined under a microscope) from the cervix (end of the uterus). It also includes testing for HPV (Human Papilloma Virus, which can cause cervical cancer). Screening and a pelvic exam should begin at age 79, or 3 years after a woman becomes sexually active. Screening should occur every year, with a Pap smear but no HPV testing, up to age 4. After age 20, you should have a Pap smear every 3 years with HPV testing, if no HPV was found previously.   Most routine colon cancer screening begins at the age of 66. On a yearly basis, doctors may provide special easy to use  take-home tests to check for hidden  blood in the stool. Sigmoidoscopy or colonoscopy can detect the earliest forms of colon cancer and is life saving. These tests use a small camera at the end of a tube to directly examine the colon. Speak to your caregiver about this at age 40, when routine screening begins (and is repeated every 5 years unless early forms of pre-cancerous polyps or small growths are found).

## 2015-09-27 LAB — URINALYSIS, ROUTINE W REFLEX MICROSCOPIC
Bilirubin Urine: NEGATIVE
Glucose, UA: NEGATIVE
HGB URINE DIPSTICK: NEGATIVE
KETONES UR: NEGATIVE
Leukocytes, UA: NEGATIVE
NITRITE: NEGATIVE
PH: 7 (ref 5.0–8.0)
PROTEIN: NEGATIVE
Specific Gravity, Urine: 1.016 (ref 1.001–1.035)

## 2015-09-27 LAB — PSA

## 2015-09-27 LAB — MICROALBUMIN / CREATININE URINE RATIO
CREATININE, URINE: 117 mg/dL (ref 20–370)
Microalb Creat Ratio: 3 mcg/mg creat (ref <30)
Microalb, Ur: 0.3 mg/dL

## 2015-09-27 LAB — VITAMIN D 25 HYDROXY (VIT D DEFICIENCY, FRACTURES): VIT D 25 HYDROXY: 92 ng/mL (ref 30–100)

## 2015-09-27 LAB — INSULIN, RANDOM: Insulin: 81.8 u[IU]/mL — ABNORMAL HIGH (ref 2.0–19.6)

## 2015-10-13 ENCOUNTER — Other Ambulatory Visit: Payer: Self-pay | Admitting: Internal Medicine

## 2015-10-29 ENCOUNTER — Other Ambulatory Visit: Payer: Self-pay | Admitting: Physician Assistant

## 2015-10-30 NOTE — Telephone Encounter (Signed)
Rx called in 

## 2015-11-07 ENCOUNTER — Ambulatory Visit: Payer: Medicare Other | Admitting: Internal Medicine

## 2015-11-07 VITALS — BP 128/76 | HR 60 | Temp 97.7°F | Resp 16 | Ht 70.25 in | Wt 204.2 lb

## 2015-11-07 DIAGNOSIS — N492 Inflammatory disorders of scrotum: Secondary | ICD-10-CM

## 2015-11-08 ENCOUNTER — Encounter: Payer: Self-pay | Admitting: Internal Medicine

## 2015-11-08 NOTE — Progress Notes (Signed)
  Subjective:    Patient ID: [name] Colon, male    DOB: 1941-04-25, 74 y.o.   MRN: [id number]  HPI Very nice 74 yo MBM returns for f/u after treating a "boil" of his R scrotum for 1 month with Doxycycline x 1 month. Patient reports the areas remains slightly tend And about the same.  Medication Sig  . amLODipine (NORVASC) 5 MG tab Take 5 mg by mouth daily.  Marland Kitchen aspirin 81 MG tab Take 81 mg by mouth daily.  Marland Kitchen atenolol (TENORMIN) 100 MG tab take 1 tablet by mouth once daily for BLOOD PRESSURE  . citalopram (CELEXA) 40 MG tab Take 20 mg by mouth daily.    . furosemide (LASIX) 40 MG tab Take 1 tablet (40 mg total) by mouth daily.  Marland Kitchen losartan-hctz 100-25 MG tab take 1 tablet by mouth once daily  . NITROSTAT 0.4 MG SL tab Place 0.4 mg under the tongue every 5 (five) minutes as needed.    . potassium chloride  20 MEQ tab take 1 tablet by mouth twice a day  . propranolol  10 MG tab Take 10 mg by mouth as needed. For anxiety  . VITAMIN B-6 50 MG tab Take 50 mg by mouth daily.    . risperiDONE (RISPERDAL) 2 MG  Take 1 mg by mouth at bedtime.   . simvastatin  80 MG tab Take 40 mg by mouth at bedtime.    . Tamsulosin HCl 0.4 MG  Take 0.4 mg by mouth daily.    Marland Kitchen zolpidem (AMBIEN) 10 MG take 1 tablet by mouth at bedtime   Allergies  Allergen Reactions  . Viagra [Sildenafil Citrate]    Past Medical History  Diagnosis Date  . History of colonic polyps   . Diverticulitis, colon   . CAD (coronary artery disease)   . 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 Hyperlipidemia   . HTN (hypertension)   . Radiation proctitis     with chronic bleeding  . Axillary abscess     left  . Arthritis    Review of Systems 10 point systems review negative except as above.    Objective:   Physical Exam  BP 128/76 mmHg  Pulse 60  Temp(Src) 97.7 F (36.5 C)  Resp 16  Ht 5' 10.25" (1.784 m)  Wt 204 lb 3.2 oz (92.625 kg)  BMI 29.10 kg/m2  GU directed exam finds a tender indurated  area of the left scrotum extending to the crural fold. No Celluitis is evident    Assessment & Plan:   1. Scrotal abscess  - Ambulatory referral to Dr Karsten Ro - Urology

## 2015-11-21 ENCOUNTER — Encounter: Payer: Self-pay | Admitting: *Deleted

## 2015-12-12 ENCOUNTER — Other Ambulatory Visit: Payer: Self-pay | Admitting: Internal Medicine

## 2015-12-29 ENCOUNTER — Encounter: Payer: Self-pay | Admitting: Internal Medicine

## 2016-01-11 ENCOUNTER — Other Ambulatory Visit: Payer: Self-pay | Admitting: Internal Medicine

## 2016-01-12 ENCOUNTER — Ambulatory Visit (INDEPENDENT_AMBULATORY_CARE_PROVIDER_SITE_OTHER): Payer: Medicare Other | Admitting: Internal Medicine

## 2016-01-12 ENCOUNTER — Encounter: Payer: Self-pay | Admitting: Internal Medicine

## 2016-01-12 VITALS — BP 144/66 | HR 54 | Temp 98.2°F | Resp 18 | Ht 70.25 in | Wt 204.0 lb

## 2016-01-12 DIAGNOSIS — E559 Vitamin D deficiency, unspecified: Secondary | ICD-10-CM | POA: Diagnosis not present

## 2016-01-12 DIAGNOSIS — I10 Essential (primary) hypertension: Secondary | ICD-10-CM | POA: Diagnosis not present

## 2016-01-12 DIAGNOSIS — E782 Mixed hyperlipidemia: Secondary | ICD-10-CM

## 2016-01-12 DIAGNOSIS — Z79899 Other long term (current) drug therapy: Secondary | ICD-10-CM

## 2016-01-12 DIAGNOSIS — E1121 Type 2 diabetes mellitus with diabetic nephropathy: Secondary | ICD-10-CM | POA: Diagnosis not present

## 2016-01-12 LAB — CBC WITH DIFFERENTIAL/PLATELET
Basophils Absolute: 0 10*3/uL (ref 0.0–0.1)
Basophils Relative: 1 % (ref 0–1)
Eosinophils Absolute: 0.2 10*3/uL (ref 0.0–0.7)
Eosinophils Relative: 5 % (ref 0–5)
HEMATOCRIT: 46.2 % (ref 39.0–52.0)
HEMOGLOBIN: 15 g/dL (ref 13.0–17.0)
LYMPHS ABS: 1.8 10*3/uL (ref 0.7–4.0)
Lymphocytes Relative: 45 % (ref 12–46)
MCH: 25.8 pg — AB (ref 26.0–34.0)
MCHC: 32.5 g/dL (ref 30.0–36.0)
MCV: 79.5 fL (ref 78.0–100.0)
MONOS PCT: 8 % (ref 3–12)
MPV: 10.5 fL (ref 8.6–12.4)
Monocytes Absolute: 0.3 10*3/uL (ref 0.1–1.0)
NEUTROS ABS: 1.6 10*3/uL — AB (ref 1.7–7.7)
NEUTROS PCT: 41 % — AB (ref 43–77)
Platelets: 180 10*3/uL (ref 150–400)
RBC: 5.81 MIL/uL (ref 4.22–5.81)
RDW: 16.1 % — ABNORMAL HIGH (ref 11.5–15.5)
WBC: 4 10*3/uL (ref 4.0–10.5)

## 2016-01-12 LAB — HEMOGLOBIN A1C
Hgb A1c MFr Bld: 6.1 % — ABNORMAL HIGH (ref <5.7)
Mean Plasma Glucose: 128 mg/dL — ABNORMAL HIGH (ref <117)

## 2016-01-12 LAB — HEPATIC FUNCTION PANEL
ALBUMIN: 4.4 g/dL (ref 3.6–5.1)
ALT: 21 U/L (ref 9–46)
AST: 23 U/L (ref 10–35)
Alkaline Phosphatase: 47 U/L (ref 40–115)
BILIRUBIN DIRECT: 0.2 mg/dL (ref <=0.2)
Indirect Bilirubin: 0.5 mg/dL (ref 0.2–1.2)
Total Bilirubin: 0.7 mg/dL (ref 0.2–1.2)
Total Protein: 7.4 g/dL (ref 6.1–8.1)

## 2016-01-12 LAB — LIPID PANEL
CHOLESTEROL: 150 mg/dL (ref 125–200)
HDL: 44 mg/dL (ref >=40)
LDL Cholesterol: 88 mg/dL (ref <130)
Total CHOL/HDL Ratio: 3.4 Ratio (ref <=5.0)
Triglycerides: 90 mg/dL (ref <150)
VLDL: 18 mg/dL (ref <30)

## 2016-01-12 LAB — BASIC METABOLIC PANEL WITH GFR
BUN: 12 mg/dL (ref 7–25)
CHLORIDE: 101 mmol/L (ref 98–110)
CO2: 27 mmol/L (ref 20–31)
CREATININE: 1.12 mg/dL (ref 0.70–1.18)
Calcium: 10 mg/dL (ref 8.6–10.3)
GFR, Est African American: 74 mL/min (ref >=60)
GFR, Est Non African American: 64 mL/min (ref >=60)
Glucose, Bld: 157 mg/dL — ABNORMAL HIGH (ref 65–99)
Potassium: 3.6 mmol/L (ref 3.5–5.3)
Sodium: 140 mmol/L (ref 135–146)

## 2016-01-12 LAB — TSH: TSH: 2.67 m[IU]/L (ref 0.40–4.50)

## 2016-01-12 NOTE — Addendum Note (Signed)
Addended by: Girtie Wiersma A on: 01/12/2016 02:46 PM   Modules accepted: Medications

## 2016-01-12 NOTE — Progress Notes (Signed)
Patient ID: [name] Colon, male   DOB: July 15, 1941, 75 y.o.   MRN: [id number]  Assessment and Plan:  Hypertension:  -Continue medication,  -monitor blood pressure at home.  -Continue DASH diet.   -Reminder to go to the ER if any CP, SOB, nausea, dizziness, severe HA, changes vision/speech, left arm numbness and tingling, and jaw pain.  Cholesterol: -Continue diet and exercise.  -Check cholesterol.   Pre-diabetes: -Continue diet and exercise.  -Check A1C  Vitamin D Def: -check level -continue medications.   Discussed weight loss today.  Recommended doing a daily blood pressure log to determine where calories are coming from.  Also recommended increased exercise.  If patient doesn't see good results he would like to see a [facility].    Continue diet and meds as discussed. Further disposition pending results of labs.  HPI 75 y.o. male  presents for 3 month follow up with hypertension, hyperlipidemia, prediabetes and vitamin D.   His blood pressure has been controlled at home, today their BP is BP: (!) 144/66 mmHg.   He does not workout. He denies chest pain, shortness of breath, dizziness.  He reports that his blood pressure has been running in the 120s-130s.   He is on cholesterol medication and denies myalgias. His cholesterol is at goal. The cholesterol last visit was:   Lab Results  Component Value Date   CHOL 153 09/26/2015   HDL 40 09/26/2015   LDLCALC 90 09/26/2015   TRIG 116 09/26/2015   CHOLHDL 3.8 09/26/2015  He has had some issues with body aches, but this is relieved with aleve.     He has not been working on diet and exercise for prediabetes, and denies foot ulcerations, hyperglycemia, hypoglycemia , increased appetite, nausea, paresthesia of the feet, polydipsia, polyuria, visual disturbances, vomiting and weight loss. Last A1C in the office was:  Lab Results  Component Value Date   HGBA1C 6.4* 09/26/2015    Patient is on Vitamin D supplement.  Lab  Results  Component Value Date   VD25OH 40 09/26/2015     He has been weighing at home daily and has been around 198 lbs.    He is taking a daily medication for his arthritis which is not listed that he is getting from the [facility] hospital.  Call over to get medication.     Current Medications:  Current Outpatient Prescriptions on File Prior to Visit  Medication Sig Dispense Refill  . amLODipine (NORVASC) 5 MG tablet Take 5 mg by mouth daily.    Marland Kitchen aspirin 81 MG tablet Take 81 mg by mouth daily.    Marland Kitchen atenolol (TENORMIN) 100 MG tablet take 1 tablet by mouth once daily for BLOOD PRESSURE 90 tablet 2  . citalopram (CELEXA) 40 MG tablet Take 20 mg by mouth daily.      . furosemide (LASIX) 40 MG tablet Take 1 tablet (40 mg total) by mouth daily. 30 tablet 11  . losartan-hydrochlorothiazide (HYZAAR) 100-25 MG tablet take 1 tablet by mouth once daily 90 tablet 1  . nitroGLYCERIN (NITROSTAT) 0.4 MG SL tablet Place 0.4 mg under the tongue every 5 (five) minutes as needed.      . potassium chloride SA (K-DUR,KLOR-CON) 20 MEQ tablet take 1 tablet by mouth twice a day 180 tablet 1  . propranolol (INDERAL) 10 MG tablet Take 10 mg by mouth as needed. For anxiety    . pyridOXINE (VITAMIN B-6) 50 MG tablet Take 50 mg by mouth daily.      Marland Kitchen  risperiDONE (RISPERDAL) 2 MG tablet Take 1 mg by mouth at bedtime.     . simvastatin (ZOCOR) 80 MG tablet Take 40 mg by mouth at bedtime.      . Tamsulosin HCl (FLOMAX) 0.4 MG CAPS Take 0.4 mg by mouth daily.      Marland Kitchen zolpidem (AMBIEN) 10 MG tablet take 1 tablet by mouth at bedtime 30 tablet 4   No current facility-administered medications on file prior to visit.    Medical History:  Past Medical History  Diagnosis Date  . History of colonic polyps   . Diverticulitis, colon   . CAD (coronary artery disease)   . 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 Hyperlipidemia   . HTN (hypertension)   . Radiation proctitis     with chronic bleeding  .  Axillary abscess     left  . Arthritis     Allergies:  Allergies  Allergen Reactions  . Viagra [Sildenafil Citrate]      Review of Systems:  Review of Systems  Constitutional: Negative for fever, chills and malaise/fatigue.  HENT: Negative for congestion, ear pain and sore throat.   Respiratory: Negative for cough, shortness of breath and wheezing.   Cardiovascular: Negative for chest pain, palpitations and leg swelling.  Gastrointestinal: Negative for heartburn, diarrhea, constipation, blood in stool and melena.  Genitourinary: Negative.   Skin: Negative.   Neurological: Negative for dizziness, sensory change, loss of consciousness and headaches.  Psychiatric/Behavioral: Negative for depression. The patient is not nervous/anxious and does not have insomnia.     Family history- Review and unchanged  Social history- Review and unchanged  Physical Exam: BP 144/66 mmHg  Pulse 54  Temp(Src) 98.2 F (36.8 C) (Temporal)  Resp 18  Ht 5' 10.25" (1.784 m)  Wt 204 lb (92.534 kg)  BMI 29.07 kg/m2  SpO2 96% Wt Readings from Last 3 Encounters:  01/12/16 204 lb (92.534 kg)  11/07/15 204 lb 3.2 oz (92.625 kg)  09/26/15 202 lb 3.2 oz (91.717 kg)    General Appearance: Well nourished well developed, in no apparent distress. Eyes: PERRLA, EOMs, conjunctiva no swelling or erythema ENT/Mouth: Ear canals normal without obstruction, swelling, erythma, discharge.  TMs normal bilaterally.  Oropharynx moist, clear, without exudate, or postoropharyngeal swelling. Neck: Supple, thyroid normal,no cervical adenopathy  Respiratory: Respiratory effort normal, Breath sounds clear A&P without rhonchi, wheeze, or rale.  No retractions, no accessory usage. Cardio: RRR with no MRGs. Brisk peripheral pulses without edema.  Abdomen: Soft, + BS,  Non tender, no guarding, rebound, hernias, masses. Musculoskeletal: Full ROM, 5/5 strength, Normal gait Skin: Warm, dry without rashes, lesions, ecchymosis.   Neuro: Awake and oriented X 3, Cranial nerves intact. Normal muscle tone, no cerebellar symptoms. Psych: Normal affect, Insight and Judgment appropriate.    [name] Skeans, PA-C 10:33 AM [facility]

## 2016-02-02 ENCOUNTER — Encounter: Payer: Self-pay | Admitting: *Deleted

## 2016-02-26 ENCOUNTER — Encounter: Payer: Self-pay | Admitting: Cardiology

## 2016-02-26 ENCOUNTER — Ambulatory Visit (INDEPENDENT_AMBULATORY_CARE_PROVIDER_SITE_OTHER): Payer: Medicare Other | Admitting: Cardiology

## 2016-02-26 VITALS — BP 124/70 | HR 54 | Ht 69.0 in | Wt 203.4 lb

## 2016-02-26 DIAGNOSIS — R079 Chest pain, unspecified: Secondary | ICD-10-CM

## 2016-02-26 DIAGNOSIS — I2510 Atherosclerotic heart disease of native coronary artery without angina pectoris: Secondary | ICD-10-CM

## 2016-02-26 NOTE — Progress Notes (Signed)
HPI The patient presents for follow up of his CAD.  He called to get an appointment because he had some chest discomfort on Sunday. He said this was similar to his previous episode in 2009 when he had stenting. He reported that this was an isolated episode. It happened while he was seated at church. He has some discomfort in his mid chest and bilateral shoulders. It lasted for about 5 minutes. He felt sweaty and nauseated. It went away on its own. It was moderate in intensity. He is otherwise not had any of this before or since. He climbs stairs at home and does not bring this on. He does not have any shortness of breath, PND or orthopnea. Unfortunately he does not exercise routinely.   Allergies  Allergen Reactions  . Viagra [Sildenafil Citrate]     Current Outpatient Prescriptions  Medication Sig Dispense Refill  . allopurinol (ZYLOPRIM) 300 MG tablet Take 300 mg by mouth daily.    Marland Kitchen amLODipine (NORVASC) 5 MG tablet Take 2.5 mg by mouth daily.    Marland Kitchen aspirin 81 MG tablet Take 81 mg by mouth daily.    Marland Kitchen atenolol (TENORMIN) 100 MG tablet take 1 tablet by mouth once daily for BLOOD PRESSURE 90 tablet 2  . citalopram (CELEXA) 40 MG tablet Take 20 mg by mouth daily.      . furosemide (LASIX) 40 MG tablet Take 1 tablet (40 mg total) by mouth daily. 30 tablet 11  . losartan-hydrochlorothiazide (HYZAAR) 100-25 MG tablet take 1 tablet by mouth once daily 90 tablet 1  . nitroGLYCERIN (NITROSTAT) 0.4 MG SL tablet Place 0.4 mg under the tongue every 5 (five) minutes as needed.      . potassium chloride SA (K-DUR,KLOR-CON) 20 MEQ tablet take 1 tablet by mouth twice a day 180 tablet 1  . propranolol (INDERAL) 10 MG tablet Take 10 mg by mouth as needed. For anxiety    . pyridOXINE (VITAMIN B-6) 50 MG tablet Take 50 mg by mouth daily.      . risperiDONE (RISPERDAL) 2 MG tablet Take 1 mg by mouth at bedtime.     . simvastatin (ZOCOR) 80 MG tablet Take 40 mg by mouth at bedtime.      . Tamsulosin HCl  (FLOMAX) 0.4 MG CAPS Take 0.4 mg by mouth daily.      Marland Kitchen zolpidem (AMBIEN) 10 MG tablet take 1 tablet by mouth at bedtime 30 tablet 4   No current facility-administered medications for this visit.    Past Medical History  Diagnosis Date  . History of colonic polyps   . Diverticulitis, colon   . CAD (coronary artery disease)   . 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 Hyperlipidemia   . HTN (hypertension)   . Radiation proctitis     with chronic bleeding  . Axillary abscess     left  . Arthritis     Past Surgical History  Procedure Laterality Date  . Coronary angioplasty with stent placement  04/2008  . Colonoscopy w/ polypectomy     ROS:  As stated in the HPI and negative for all other systems.  PHYSICAL EXAM BP 124/70 mmHg  Pulse 54  Ht 5\' 9"  (1.753 m)  Wt 203 lb 6.4 oz (92.262 kg)  BMI 30.02 kg/m2  SpO2 95% GENERAL:  Well appearing NECK:  No jugular venous distention, waveform within normal limits, carotid upstroke brisk and symmetric, no bruits, no thyromegaly LUNGS:  Clear  to auscultation bilaterally CHEST:  Unremarkable HEART:  PMI not displaced or sustained,S1 and S2 within normal limits, no S3, no S4, no clicks, no rubs, no murmurs ABD:  Flat, positive bowel sounds normal in frequency in pitch, no bruits, no rebound, no guarding, no midline pulsatile mass, no hepatomegaly, no splenomegaly EXT:  2 plus pulses throughout, trace edema, no cyanosis no clubbing   EKG:  Sinus rhythm, rate 54, axis within normal limits, intervals within normal limits, no acute ST-T wave changes.  02/26/2016   ASSESSMENT AND PLAN  CAD -  He has had one episode of chest discomfort. However, given his past history I will bring him back for a Lexiscan Myoview.  HYPERLIPIDEMIA -  I have seen recent lipids.  This is followed by Alesia Richards, MD.  I will defer to his management.    Lab Results  Component Value Date   CHOL 150 01/12/2016   TRIG 90  01/12/2016   HDL 44 01/12/2016   LDLCALC 88 01/12/2016     HYPERTENSION -  His blood pressure is controlled.  He will continue the meds as listed.

## 2016-02-26 NOTE — Patient Instructions (Signed)
NO CHANGE WITH CURRENT MEDICATIONS   Your physician has requested that you have a lexiscan myoview. For further information please visit [email or web address]. Please follow instruction sheet, as given.    Your physician wants you to follow-up in [place] will receive a reminder letter in the mail two months in advance. If you don't receive a letter, please call our office to schedule the follow-up appointment.  If you need a refill on your cardiac medications before your next appointment, please call your pharmacy.

## 2016-03-05 ENCOUNTER — Telehealth (HOSPITAL_COMMUNITY): Payer: Self-pay

## 2016-03-05 NOTE — Telephone Encounter (Signed)
Encounter complete. 

## 2016-03-10 ENCOUNTER — Ambulatory Visit (HOSPITAL_COMMUNITY)
Admission: RE | Admit: 2016-03-10 | Discharge: 2016-03-10 | Disposition: A | Discharge status: Home / Self Care | Payer: Medicare Other | Source: Ambulatory Visit | Attending: Internal Medicine | Admitting: Internal Medicine

## 2016-03-10 DIAGNOSIS — Z8249 Family history of ischemic heart disease and other diseases of the circulatory system: Secondary | ICD-10-CM | POA: Insufficient documentation

## 2016-03-10 DIAGNOSIS — R9439 Abnormal result of other cardiovascular function study: Secondary | ICD-10-CM | POA: Insufficient documentation

## 2016-03-10 DIAGNOSIS — R079 Chest pain, unspecified: Secondary | ICD-10-CM | POA: Diagnosis present

## 2016-03-10 DIAGNOSIS — I2510 Atherosclerotic heart disease of native coronary artery without angina pectoris: Secondary | ICD-10-CM | POA: Diagnosis not present

## 2016-03-10 DIAGNOSIS — Z87891 Personal history of nicotine dependence: Secondary | ICD-10-CM | POA: Diagnosis not present

## 2016-03-10 DIAGNOSIS — I10 Essential (primary) hypertension: Secondary | ICD-10-CM | POA: Diagnosis not present

## 2016-03-10 LAB — MYOCARDIAL PERFUSION IMAGING
CHL CUP NUCLEAR SDS: 4
CHL CUP RESTING HR STRESS: 48 {beats}/min
LV sys vol: 67 mL
LVDIAVOL: 130 mL (ref 62–150)
Peak HR: 64 {beats}/min
SRS: 0
SSS: 4
TID: 1.09

## 2016-03-10 IMAGING — NM NM MISC PROCEDURE
6 series · 36 of 36 positions shown · non-contrast
Comparison: none

[Series 1: wbr_r-proj_st wbr rest · 6.40mm/px · 6 of 64 frames shown]
[frame 6/64]
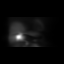
[frame 16/64]
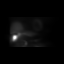
[frame 27/64]
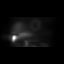
[frame 38/64]
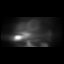
[frame 48/64]
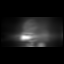
[frame 59/64]
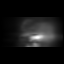

[Series 1: wbr rest · 6.40mm/px · 6 of 64 frames shown]
[frame 6/64]
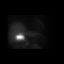
[frame 16/64]
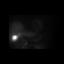
[frame 27/64]
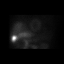
[frame 38/64]
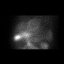
[frame 48/64]
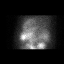
[frame 59/64]
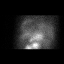

[Series 2: wbr stress-gsp · 6.40mm/px · 6 of 512 frames shown]
[frame 43/512]
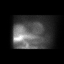
[frame 128/512]
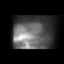
[frame 214/512]
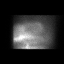
[frame 299/512]
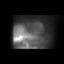
[frame 384/512]
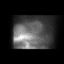
[frame 470/512]
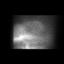

[Series 2: wbr_s-proj_st wbr stress-gsp · 6.40mm/px · 6 of 512 frames shown]
[frame 43/512]
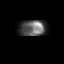
[frame 128/512]
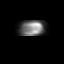
[frame 214/512]
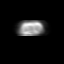
[frame 299/512]
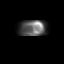
[frame 384/512]
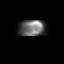
[frame 470/512]
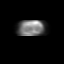

[Series 3: wbr_s-proj_st wbr stress-sum-em · 6.40mm/px · 6 of 64 frames shown]
[frame 6/64]
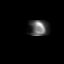
[frame 16/64]
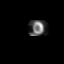
[frame 27/64]
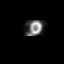
[frame 38/64]
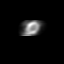
[frame 48/64]
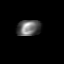
[frame 59/64]
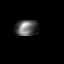

[Series 3: wbr stress-sum-em · 6.40mm/px · 6 of 64 frames shown]
[frame 6/64]
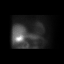
[frame 16/64]
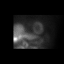
[frame 27/64]
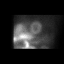
[frame 38/64]
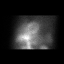
[frame 48/64]
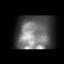
[frame 59/64]
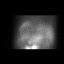

[36 of 36 positions shown; findings below may reference images not displayed]

Canned report from images found in remote index.

Refer to host system for actual result text.

## 2016-03-10 MED ORDER — TECHNETIUM TC 99M SESTAMIBI GENERIC - CARDIOLITE
9.9000 | Freq: Once | INTRAVENOUS | Status: AC | PRN
  Administered 2016-03-10: 9.9 via INTRAVENOUS

## 2016-03-10 MED ORDER — REGADENOSON 0.4 MG/5ML IV SOLN
0.4000 mg | Freq: Once | INTRAVENOUS | Status: AC
  Administered 2016-03-10: 0.4 mg via INTRAVENOUS

## 2016-03-10 MED ORDER — TECHNETIUM TC 99M SESTAMIBI GENERIC - CARDIOLITE
28.6000 | Freq: Once | INTRAVENOUS | Status: AC | PRN
  Administered 2016-03-10: 28.6 via INTRAVENOUS

## 2016-03-30 ENCOUNTER — Other Ambulatory Visit: Payer: Self-pay | Admitting: Internal Medicine

## 2016-03-31 NOTE — Telephone Encounter (Signed)
Rx called into [facility]

## 2016-04-12 ENCOUNTER — Ambulatory Visit (INDEPENDENT_AMBULATORY_CARE_PROVIDER_SITE_OTHER): Payer: Medicare Other | Admitting: Internal Medicine

## 2016-04-12 ENCOUNTER — Encounter: Payer: Self-pay | Admitting: Internal Medicine

## 2016-04-12 VITALS — BP 136/78 | HR 56 | Temp 98.2°F | Resp 16 | Ht 70.25 in | Wt 202.0 lb

## 2016-04-12 DIAGNOSIS — M109 Gout, unspecified: Secondary | ICD-10-CM | POA: Insufficient documentation

## 2016-04-12 DIAGNOSIS — Z79899 Other long term (current) drug therapy: Secondary | ICD-10-CM

## 2016-04-12 DIAGNOSIS — E559 Vitamin D deficiency, unspecified: Secondary | ICD-10-CM

## 2016-04-12 DIAGNOSIS — N182 Chronic kidney disease, stage 2 (mild): Secondary | ICD-10-CM | POA: Diagnosis not present

## 2016-04-12 DIAGNOSIS — I10 Essential (primary) hypertension: Secondary | ICD-10-CM | POA: Diagnosis not present

## 2016-04-12 DIAGNOSIS — E1122 Type 2 diabetes mellitus with diabetic chronic kidney disease: Secondary | ICD-10-CM | POA: Diagnosis not present

## 2016-04-12 DIAGNOSIS — E782 Mixed hyperlipidemia: Secondary | ICD-10-CM

## 2016-04-12 DIAGNOSIS — I2510 Atherosclerotic heart disease of native coronary artery without angina pectoris: Secondary | ICD-10-CM

## 2016-04-12 DIAGNOSIS — M1000 Idiopathic gout, unspecified site: Secondary | ICD-10-CM

## 2016-04-12 LAB — BASIC METABOLIC PANEL WITH GFR
BUN: 11 mg/dL (ref 7–25)
CHLORIDE: 101 mmol/L (ref 98–110)
CO2: 30 mmol/L (ref 20–31)
Calcium: 10.2 mg/dL (ref 8.6–10.3)
Creat: 0.99 mg/dL (ref 0.70–1.18)
GFR, EST AFRICAN AMERICAN: 86 mL/min (ref >=60)
GFR, EST NON AFRICAN AMERICAN: 74 mL/min (ref >=60)
GLUCOSE: 118 mg/dL — AB (ref 65–99)
POTASSIUM: 3.6 mmol/L (ref 3.5–5.3)
Sodium: 140 mmol/L (ref 135–146)

## 2016-04-12 LAB — CBC WITH DIFFERENTIAL/PLATELET
BASOS PCT: 1 %
Basophils Absolute: 44 cells/uL (ref 0–200)
EOS PCT: 3 %
Eosinophils Absolute: 132 cells/uL (ref 15–500)
HEMATOCRIT: 44.4 % (ref 38.5–50.0)
HEMOGLOBIN: 14.2 g/dL (ref 13.2–17.1)
LYMPHS ABS: 1760 {cells}/uL (ref 850–3900)
Lymphocytes Relative: 40 %
MCH: 25.3 pg — ABNORMAL LOW (ref 27.0–33.0)
MCHC: 32 g/dL (ref 32.0–36.0)
MCV: 79 fL — AB (ref 80.0–100.0)
MONO ABS: 484 {cells}/uL (ref 200–950)
MPV: 10.7 fL (ref 7.5–12.5)
Monocytes Relative: 11 %
NEUTROS ABS: 1980 {cells}/uL (ref 1500–7800)
Neutrophils Relative %: 45 %
Platelets: 179 10*3/uL (ref 140–400)
RBC: 5.62 MIL/uL (ref 4.20–5.80)
RDW: 16.5 % — AB (ref 11.0–15.0)
WBC: 4.4 10*3/uL (ref 3.8–10.8)

## 2016-04-12 LAB — MAGNESIUM: Magnesium: 2.2 mg/dL (ref 1.5–2.5)

## 2016-04-12 LAB — HEPATIC FUNCTION PANEL
ALBUMIN: 4.4 g/dL (ref 3.6–5.1)
ALK PHOS: 48 U/L (ref 40–115)
ALT: 22 U/L (ref 9–46)
AST: 23 U/L (ref 10–35)
BILIRUBIN INDIRECT: 0.4 mg/dL (ref 0.2–1.2)
Bilirubin, Direct: 0.1 mg/dL (ref <=0.2)
TOTAL PROTEIN: 6.9 g/dL (ref 6.1–8.1)
Total Bilirubin: 0.5 mg/dL (ref 0.2–1.2)

## 2016-04-12 LAB — URIC ACID: URIC ACID, SERUM: 3.6 mg/dL — AB (ref 4.0–7.8)

## 2016-04-12 LAB — LIPID PANEL
Cholesterol: 142 mg/dL (ref 125–200)
HDL: 40 mg/dL (ref >=40)
LDL CALC: 78 mg/dL (ref <130)
TRIGLYCERIDES: 120 mg/dL (ref <150)
Total CHOL/HDL Ratio: 3.6 Ratio (ref <=5.0)
VLDL: 24 mg/dL (ref <30)

## 2016-04-12 LAB — TSH: TSH: 2.27 m[IU]/L (ref 0.40–4.50)

## 2016-04-12 NOTE — Patient Instructions (Signed)

## 2016-04-12 NOTE — Progress Notes (Signed)
Patient ID: [name] Colon, male   DOB: Feb 06, 1941, 75 y.o.   MRN: [id number]  [facility]                       Unk Pinto, M.D.        Uvaldo Bristle. Silverio Lay, P.A.-C       Starlyn Skeans, P.A.-C   [facility]                [address] [place], [place] [id number] Telephone [phone] Telefax [phone] _________________________________________________________________________   This very nice 75 y.o. MBM presents for 3 month follow up with Hypertension, Hyperlipidemia, Pre-Diabetes and Vitamin D Deficiency.    Patient is treated for HTN & BP has been controlled at home. Today's BP: 136/78 mmHg. Patient has CAD s/p Stent (2009) and he has had had no complaints of any cardiac type chest pain, palpitations, dyspnea/orthopnea/PND, dizziness, claudication, or dependent edema.   Hyperlipidemia is controlled with diet & meds. Patient denies myalgias or other med SE's. Last Lipids were at goal with Cholesterol 150; HDL 44; LDL 88; Triglycerides 90 on 01/12/2016.   Also, the patient has history of T2_DM predating since 2003 which he attempts to control with diet  and has had no symptoms of reactive hypoglycemia, diabetic polys, paresthesias or visual blurring.   In 2011 his A1c 6.7% and most recent A1c was 6.1% on 01/12/2016.   Further, the patient also has history of Vitamin D Deficiency of "52" in 2008  and supplements vitamin D without any suspected side-effects. Last vitamin D was 92 on 09/26/2015.  Medication Sig  . allopurinol (ZYLOPRIM) 300 MG tablet Take 300 mg by mouth daily.  Marland Kitchen amLODipine (NORVASC) 5 MG tablet Take 2.5 mg by mouth daily.  Marland Kitchen aspirin 81 MG tablet Take 81 mg by mouth daily.  Marland Kitchen atenolol (TENORMIN) 100 MG tablet take 1 tablet by mouth once daily for BLOOD PRESSURE  . citalopram (CELEXA) 40 MG tablet Take 20 mg by mouth daily.    . furosemide (LASIX) 40 MG tablet Take 1 tablet (40 mg  total) by mouth daily.  Marland Kitchen losartan-hydrochlorothiazide (HYZAAR) 100-25 MG tablet take 1 tablet by mouth once daily  . nitroGLYCERIN (NITROSTAT) 0.4 MG SL tablet Place 0.4 mg under the tongue every 5 (five) minutes as needed.    . potassium chloride SA (K-DUR,KLOR-CON) 20 MEQ tablet take 1 tablet by mouth twice a day  . propranolol (INDERAL) 10 MG tablet Take 10 mg by mouth as needed. For anxiety  . pyridOXINE / VITAMIN B-6 50 MG  Take 50 mg by mouth daily.    . risperiDONE  2 MG tablet Take 1 mg by mouth at bedtime.   . simvastatin  80 MG tablet Take 40 mg by mouth at bedtime.    . Tamsulosin  0.4 MG CAPS Take 0.4 mg by mouth daily.    Marland Kitchen zolpidem  10 MG tablet Take 1/2 to 1 tablet for sleep only if needed   Allergies  Allergen Reactions  . Viagra [Sildenafil Citrate]    PMHx:   Past Medical History  Diagnosis Date  . History of colonic polyps   . Diverticulitis, colon   . CAD (coronary artery disease)   . 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 Hyperlipidemia   . HTN (hypertension)   .  Radiation proctitis     with chronic bleeding  . Axillary abscess     left  . Arthritis    Immunization History  Administered Date(s) Administered  . Influenza-Unspecified 08/22/2014, 09/03/2015  . Pneumococcal Conjugate-13 09/03/2015  . Td 11/22/2006   Past Surgical History  Procedure Laterality Date  . Coronary angioplasty with stent placement  04/2008  . Colonoscopy w/ polypectomy     FHx:    Reviewed / unchanged  SHx:    Reviewed / unchanged  Systems Review:  Constitutional: Denies fever, chills, wt changes, headaches, insomnia, fatigue, night sweats, change in appetite. Eyes: Denies redness, blurred vision, diplopia, discharge, itchy, watery eyes.  ENT: Denies discharge, congestion, post nasal drip, epistaxis, sore throat, earache, hearing loss, dental pain, tinnitus, vertigo, sinus pain, snoring.  CV: Denies chest pain, palpitations, irregular heartbeat,  syncope, dyspnea, diaphoresis, orthopnea, PND, claudication or edema. Respiratory: denies cough, dyspnea, DOE, pleurisy, hoarseness, laryngitis, wheezing.  Gastrointestinal: Denies dysphagia, odynophagia, heartburn, reflux, water brash, abdominal pain or cramps, nausea, vomiting, bloating, diarrhea, constipation, hematemesis, melena, hematochezia  or hemorrhoids. Genitourinary: Denies dysuria, frequency, urgency, nocturia, hesitancy, discharge, hematuria or flank pain. Musculoskeletal: Denies arthralgias, myalgias, stiffness, jt. swelling, pain, limping or strain/sprain.  Skin: Denies pruritus, rash, hives, warts, acne, eczema or change in skin lesion(s). Neuro: No weakness, tremor, incoordination, spasms, paresthesia or pain. Psychiatric: Denies confusion, memory loss or sensory loss. Endo: Denies change in weight, skin or hair change.  Heme/Lymph: No excessive bleeding, bruising or enlarged lymph nodes.  Physical Exam  BP 136/78 mmHg  Pulse 56  Temp(Src) 98.2 F (36.8 C) (Temporal)  Resp 16  Ht 5' 10.25" (1.784 m)  Wt 202 lb (91.627 kg)  BMI 28.79 kg/m2  Appears well nourished and in no distress.  Eyes: PERRLA, EOMs, conjunctiva no swelling or erythema. Sinuses: No frontal/maxillary tenderness ENT/Mouth: EAC's clear, TM's nl w/o erythema, bulging. Nares clear w/o erythema, swelling, exudates. Oropharynx clear without erythema or exudates. Oral hygiene is good. Tongue normal, non obstructing. Hearing intact.  Neck: Supple. Thyroid nl. Car 2+/2+ without bruits, nodes or JVD. Chest: Respirations nl with BS clear & equal w/o rales, rhonchi, wheezing or stridor.  Cor: Heart sounds normal w/ regular rate and rhythm without sig. murmurs, gallops, clicks, or rubs. Peripheral pulses normal and equal  without edema.  Abdomen: Soft & bowel sounds normal. Non-tender w/o guarding, rebound, hernias, masses, or organomegaly.  Lymphatics: Unremarkable.  Musculoskeletal: Full ROM all peripheral  extremities, joint stability, 5/5 strength, and normal gait.  Skin: Warm, dry without exposed rashes, lesions or ecchymosis apparent.  Neuro: Cranial nerves intact, reflexes equal bilaterally. Sensory-motor testing grossly intact. Tendon reflexes grossly intact.  Pysch: Alert & oriented x 3.  Insight and judgement nl & appropriate. No ideations.  Assessment and Plan:  1. Essential hypertension  - TSH  2. Hyperlipidemia  - Lipid panel - TSH  3. Type 2 diabetes mellitus with stage 2 chronic kidney disease, without long-term current use of insulin (HCC)  - Hemoglobin A1c - Insulin, random  4. Vitamin D deficiency  - VITAMIN D 25 Hydroxy   5. Atherosclerosis of native coronary artery of native heart without angina pectoris   6. Idiopathic gout  - Uric acid  7. Medication management  - CBC with Differential/Platelet - BASIC METABOLIC PANEL WITH GFR - Hepatic function panel - Magnesium   Recommended regular exercise, BP monitoring, weight control, and discussed med and SE's. Recommended labs to assess and monitor clinical status. Further disposition pending results of  labs. Over 30 minutes of exam, counseling, chart review was performed

## 2016-04-13 LAB — VITAMIN D 25 HYDROXY (VIT D DEFICIENCY, FRACTURES): Vit D, 25-Hydroxy: 74 ng/mL (ref 30–100)

## 2016-04-13 LAB — INSULIN, RANDOM: INSULIN: 82.3 u[IU]/mL — AB (ref 2.0–19.6)

## 2016-04-13 LAB — HEMOGLOBIN A1C
HEMOGLOBIN A1C: 6.6 % — AB (ref <5.7)
MEAN PLASMA GLUCOSE: 143 mg/dL

## 2016-05-03 ENCOUNTER — Other Ambulatory Visit: Payer: Self-pay | Admitting: Internal Medicine

## 2016-06-09 ENCOUNTER — Other Ambulatory Visit: Payer: Self-pay | Admitting: Internal Medicine

## 2016-06-11 ENCOUNTER — Other Ambulatory Visit: Payer: Self-pay | Admitting: Internal Medicine

## 2016-06-14 ENCOUNTER — Other Ambulatory Visit: Payer: Self-pay | Admitting: Internal Medicine

## 2016-07-09 ENCOUNTER — Other Ambulatory Visit: Payer: Self-pay | Admitting: Internal Medicine

## 2016-07-11 NOTE — Progress Notes (Signed)
HPI The patient presents for follow up of his CAD .  At the last visit in April he had had some chest pain. Marland Kitchen Perfusion study did not demonstrate ischemia.  He returns for follow up.   The patient denies any new symptoms such as chest discomfort, neck or arm discomfort. There has been no new shortness of breath, PND or orthopnea. There have been no reported palpitations, presyncope or syncope.  He is not exercising because of right knee pain.  He climbs the stairs in his house.   Allergies  Allergen Reactions  . Viagra [Sildenafil Citrate]     Current Outpatient Prescriptions  Medication Sig Dispense Refill  . allopurinol (ZYLOPRIM) 300 MG tablet Take 300 mg by mouth daily.    Marland Kitchen amLODipine (NORVASC) 5 MG tablet Take 0.5 tablets (2.5 mg total) by mouth daily. 45 tablet 3  . aspirin 81 MG tablet Take 81 mg by mouth daily.    Marland Kitchen atenolol (TENORMIN) 100 MG tablet take 1 tablet by mouth once daily for BLOOD PRESSURE 90 tablet 2  . citalopram (CELEXA) 40 MG tablet Take 20 mg by mouth daily.      . furosemide (LASIX) 40 MG tablet take 1 tablet by mouth once daily 30 tablet 3  . losartan-hydrochlorothiazide (HYZAAR) 100-25 MG tablet take 1 tablet by mouth once daily 90 tablet 1  . nitroGLYCERIN (NITROSTAT) 0.4 MG SL tablet Place 0.4 mg under the tongue every 5 (five) minutes as needed.      . potassium chloride SA (K-DUR,KLOR-CON) 20 MEQ tablet take 1 tablet by mouth twice a day 180 tablet 1  . propranolol (INDERAL) 10 MG tablet Take 10 mg by mouth as needed. For anxiety    . pyridOXINE (VITAMIN B-6) 50 MG tablet Take 50 mg by mouth daily.      . risperiDONE (RISPERDAL) 2 MG tablet Take 1 mg by mouth at bedtime.     . simvastatin (ZOCOR) 80 MG tablet Take 40 mg by mouth at bedtime.      . Tamsulosin HCl (FLOMAX) 0.4 MG CAPS Take 0.4 mg by mouth daily.      Marland Kitchen zolpidem (AMBIEN) 10 MG tablet take 1 tablet by mouth at bedtime 30 tablet 0   No current facility-administered medications for this visit.      Past Medical History:  Diagnosis Date  . Adenocarcinoma of prostate ([place])    s/p seed implantation  . Arthritis   . Axillary abscess    left  . CAD (coronary artery disease)   . Diverticulitis, colon   . History of colonic polyps   . HTN (hypertension)   . Hyperlipidemia   . Radiation proctitis    with chronic bleeding    Past Surgical History:  Procedure Laterality Date  . COLONOSCOPY W/ POLYPECTOMY    . CORONARY ANGIOPLASTY WITH STENT PLACEMENT  04/2008   ROS:   As stated in the HPI and negative for all other systems.  PHYSICAL EXAM BP 136/82 (BP Location: Left Arm, Patient Position: Sitting, Cuff Size: Normal)   Pulse (!) 56   Ht 5\' 9"  (1.753 m)   Wt 205 lb 3.2 oz (93.1 kg)   BMI 30.30 kg/m  GENERAL:  Well appearing NECK:  No jugular venous distention, waveform within normal limits, carotid upstroke brisk and symmetric, no bruits, no thyromegaly LUNGS:  Clear to auscultation bilaterally CHEST:  Unremarkable HEART:  PMI not displaced or sustained,S1 and S2 within normal limits, no S3, no S4, no clicks,  no rubs, no murmurs ABD:  Flat, positive bowel sounds normal in frequency in pitch, no bruits, no rebound, no guarding, no midline pulsatile mass, no hepatomegaly, no splenomegaly EXT:  2 plus pulses throughout, trace edema, no cyanosis no clubbing   EKG:  Not done today.  ASSESSMENT AND PLAN  CAD -  He had a Lexiscan Myoview.  He has had no new symptoms this study.  No further work up is planned.   HYPERLIPIDEMIA -  I have seen recent lipids.  This is followed by Alesia Richards, MD.     Lab Results  Component Value Date   CHOL 142 04/12/2016   TRIG 120 04/12/2016   HDL 40 04/12/2016   LDLCALC 78 04/12/2016    HYPERTENSION -  His blood pressure is controlled.  He will continue the meds as listed.

## 2016-07-12 ENCOUNTER — Ambulatory Visit (INDEPENDENT_AMBULATORY_CARE_PROVIDER_SITE_OTHER): Payer: Medicare Other | Admitting: Cardiology

## 2016-07-12 ENCOUNTER — Encounter: Payer: Self-pay | Admitting: Cardiology

## 2016-07-12 VITALS — BP 136/82 | HR 56 | Ht 69.0 in | Wt 205.2 lb

## 2016-07-12 DIAGNOSIS — I2510 Atherosclerotic heart disease of native coronary artery without angina pectoris: Secondary | ICD-10-CM

## 2016-07-12 DIAGNOSIS — E782 Mixed hyperlipidemia: Secondary | ICD-10-CM | POA: Diagnosis not present

## 2016-07-12 MED ORDER — AMLODIPINE BESYLATE 5 MG PO TABS: 2.5000 mg | ORAL_TABLET | Freq: Every day | ORAL | 3 refills | Status: DC

## 2016-07-12 NOTE — Patient Instructions (Signed)
Medication Instructions:  Continue current medications  Labwork: NONE  Testing/Procedures: NONE  Follow-Up: Your physician wants you to follow-up in: 1 Year. You will receive a reminder letter in the mail two months in advance. If you don't receive a letter, please call our office to schedule the follow-up appointment.   Any Other Special Instructions Will Be Listed Below (If Applicable).   If you need a refill on your cardiac medications before your next appointment, please call your pharmacy.   

## 2016-07-15 ENCOUNTER — Ambulatory Visit (INDEPENDENT_AMBULATORY_CARE_PROVIDER_SITE_OTHER): Payer: Medicare Other | Admitting: Physician Assistant

## 2016-07-15 ENCOUNTER — Encounter: Payer: Self-pay | Admitting: Physician Assistant

## 2016-07-15 VITALS — BP 130/80 | HR 59 | Temp 97.9°F | Resp 14 | Ht 69.0 in | Wt 205.4 lb

## 2016-07-15 DIAGNOSIS — N182 Chronic kidney disease, stage 2 (mild): Secondary | ICD-10-CM

## 2016-07-15 DIAGNOSIS — E559 Vitamin D deficiency, unspecified: Secondary | ICD-10-CM | POA: Diagnosis not present

## 2016-07-15 DIAGNOSIS — E782 Mixed hyperlipidemia: Secondary | ICD-10-CM | POA: Diagnosis not present

## 2016-07-15 DIAGNOSIS — E1122 Type 2 diabetes mellitus with diabetic chronic kidney disease: Secondary | ICD-10-CM

## 2016-07-15 DIAGNOSIS — Z79899 Other long term (current) drug therapy: Secondary | ICD-10-CM

## 2016-07-15 DIAGNOSIS — C61 Malignant neoplasm of prostate: Secondary | ICD-10-CM | POA: Diagnosis not present

## 2016-07-15 DIAGNOSIS — I2510 Atherosclerotic heart disease of native coronary artery without angina pectoris: Secondary | ICD-10-CM

## 2016-07-15 DIAGNOSIS — I10 Essential (primary) hypertension: Secondary | ICD-10-CM

## 2016-07-15 LAB — BASIC METABOLIC PANEL WITH GFR
BUN: 12 mg/dL (ref 7–25)
CALCIUM: 9.9 mg/dL (ref 8.6–10.3)
CO2: 28 mmol/L (ref 20–31)
CREATININE: 1.11 mg/dL (ref 0.70–1.18)
Chloride: 102 mmol/L (ref 98–110)
GFR, Est African American: 75 mL/min (ref >=60)
GFR, Est Non African American: 65 mL/min (ref >=60)
Glucose, Bld: 128 mg/dL — ABNORMAL HIGH (ref 65–99)
Potassium: 3.6 mmol/L (ref 3.5–5.3)
SODIUM: 142 mmol/L (ref 135–146)

## 2016-07-15 LAB — HEPATIC FUNCTION PANEL
ALT: 19 U/L (ref 9–46)
AST: 23 U/L (ref 10–35)
Albumin: 4.4 g/dL (ref 3.6–5.1)
Alkaline Phosphatase: 48 U/L (ref 40–115)
BILIRUBIN DIRECT: 0.2 mg/dL (ref <=0.2)
Indirect Bilirubin: 0.4 mg/dL (ref 0.2–1.2)
TOTAL PROTEIN: 7 g/dL (ref 6.1–8.1)
Total Bilirubin: 0.6 mg/dL (ref 0.2–1.2)

## 2016-07-15 LAB — TSH: TSH: 2.09 m[IU]/L (ref 0.40–4.50)

## 2016-07-15 LAB — LIPID PANEL
CHOLESTEROL: 151 mg/dL (ref 125–200)
HDL: 47 mg/dL (ref >=40)
LDL Cholesterol: 81 mg/dL (ref <130)
TRIGLYCERIDES: 116 mg/dL (ref <150)
Total CHOL/HDL Ratio: 3.2 Ratio (ref <=5.0)
VLDL: 23 mg/dL (ref <30)

## 2016-07-15 LAB — CBC WITH DIFFERENTIAL/PLATELET
Basophils Absolute: 44 cells/uL (ref 0–200)
Basophils Relative: 1 %
Eosinophils Absolute: 176 cells/uL (ref 15–500)
Eosinophils Relative: 4 %
HCT: 44 % (ref 38.5–50.0)
Hemoglobin: 14.3 g/dL (ref 13.2–17.1)
Lymphocytes Relative: 42 %
Lymphs Abs: 1848 cells/uL (ref 850–3900)
MCH: 26 pg — ABNORMAL LOW (ref 27.0–33.0)
MCHC: 32.5 g/dL (ref 32.0–36.0)
MCV: 80 fL (ref 80.0–100.0)
MONO ABS: 440 {cells}/uL (ref 200–950)
MPV: 10.7 fL (ref 7.5–12.5)
Monocytes Relative: 10 %
NEUTROS PCT: 43 %
Neutro Abs: 1892 cells/uL (ref 1500–7800)
Platelets: 165 10*3/uL (ref 140–400)
RBC: 5.5 MIL/uL (ref 4.20–5.80)
RDW: 15.8 % — AB (ref 11.0–15.0)
WBC: 4.4 10*3/uL (ref 3.8–10.8)

## 2016-07-15 LAB — MAGNESIUM: MAGNESIUM: 1.9 mg/dL (ref 1.5–2.5)

## 2016-07-15 MED ORDER — ZOLPIDEM TARTRATE 10 MG PO TABS: 10.0000 mg | ORAL_TABLET | Freq: Every day | ORAL | 5 refills | Status: DC

## 2016-07-15 NOTE — Patient Instructions (Signed)
Use a dropper or use a cap to put olive oil,mineral oil or canola oil in the effected ear- 2-3 times a week. Let it soak for 20-30 min then you can take a shower or use a baby bulb with warm water to wash out the ear wax.  Do not use Qtips     Bad carbs also include fruit juice, alcohol, and sweet tea. These are empty calories that do not signal to your brain that you are full.   Please remember the good carbs are still carbs which convert into sugar. So please measure them out no more than 1/2-1 cup of rice, oatmeal, pasta, and beans  Veggies are however free foods! Pile them on.   Not all fruit is created equal. Please see the list below, the fruit at the bottom is higher in sugars than the fruit at the top. Please avoid all dried fruits.     

## 2016-07-15 NOTE — Progress Notes (Signed)
Patient ID: [name] Colon, male   DOB: October 14, 1941, 75 y.o.   MRN: [id number]  Assessment and Plan:  Hypertension:  -Continue medication,  -monitor blood pressure at home.  -Continue DASH diet.   -Reminder to go to the ER if any CP, SOB, nausea, dizziness, severe HA, changes vision/speech, left arm numbness and tingling, and jaw pain.  Cholesterol: -Continue diet and exercise.  -Check cholesterol.   Diabetes with CKD stage 2: -Continue diet and exercise.  -Check A1C - weight loss advised  Vitamin D Def: -check level -continue medications.  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   Other orders -     zolpidem (AMBIEN) 10 MG tablet; Take 1 tablet (10 mg total) by mouth at bedtime.     Continue diet and meds as discussed. Further disposition pending results of labs.  HPI 75 y.o. male  presents for 3 month follow up with hypertension, hyperlipidemia, prediabetes and vitamin D.   His blood pressure has been controlled at home, today their BP is BP: 130/80.   He does not workout. He denies chest pain, shortness of breath, dizziness.     He is on cholesterol medication and denies myalgias. His cholesterol is at goal. The cholesterol last visit was:   Lab Results  Component Value Date   CHOL 142 04/12/2016   HDL 40 04/12/2016   LDLCALC 78 04/12/2016   TRIG 120 04/12/2016   CHOLHDL 3.6 04/12/2016    He has not been working on diet and exercise for diabetes with CKD, he is on ASA and ARB, and denies foot ulcerations, hyperglycemia, hypoglycemia , increased appetite, nausea, paresthesia of the feet, polydipsia, polyuria, visual disturbances, vomiting and weight loss. Last A1C in the office was:  Lab Results  Component Value Date   HGBA1C 6.6 (H) 04/12/2016   Lab Results  Component Value Date   GFRAA 86 04/12/2016   Patient is  on Vitamin D supplement.  Lab Results  Component Value Date   VD25OH 74 04/12/2016   BMI is Body mass index is 30.33 kg/m., he is working on diet and exercise. Wt Readings from Last 3 Encounters:  07/15/16 205 lb 6.4 oz (93.2 kg)  07/12/16 205 lb 3.2 oz (93.1 kg)  04/12/16 202 lb (91.6 kg)     Current Medications:  Current Outpatient Prescriptions on File Prior to Visit  Medication Sig Dispense Refill  . allopurinol (ZYLOPRIM) 300 MG tablet Take 300 mg by mouth daily.    Marland Kitchen amLODipine (NORVASC) 5 MG tablet Take 0.5 tablets (2.5 mg total) by mouth daily. 45 tablet 3  . aspirin 81 MG tablet Take 81 mg by mouth daily.    Marland Kitchen atenolol (TENORMIN) 100 MG tablet take 1 tablet by mouth once daily for BLOOD PRESSURE 90 tablet 2  . citalopram (CELEXA) 40 MG tablet Take 20 mg by mouth daily.      . furosemide (LASIX) 40 MG tablet take 1 tablet by mouth once daily 30 tablet 3  . losartan-hydrochlorothiazide (HYZAAR) 100-25 MG tablet take 1 tablet by mouth once daily 90 tablet 1  . nitroGLYCERIN (NITROSTAT) 0.4 MG SL tablet Place 0.4 mg under the tongue every 5 (five) minutes as needed.      . potassium chloride SA (K-DUR,KLOR-CON) 20 MEQ tablet take 1 tablet by mouth twice a day 180 tablet 1  . propranolol (INDERAL) 10 MG tablet Take 10 mg  by mouth as needed. For anxiety    . pyridOXINE (VITAMIN B-6) 50 MG tablet Take 50 mg by mouth daily.      . risperiDONE (RISPERDAL) 2 MG tablet Take 1 mg by mouth at bedtime.     . simvastatin (ZOCOR) 80 MG tablet Take 40 mg by mouth at bedtime.      . Tamsulosin HCl (FLOMAX) 0.4 MG CAPS Take 0.4 mg by mouth daily.      Marland Kitchen zolpidem (AMBIEN) 10 MG tablet take 1 tablet by mouth at bedtime 30 tablet 0   No current facility-administered medications on file prior to visit.     Medical History:  Past Medical History:  Diagnosis Date  . Adenocarcinoma of prostate ([place])    s/p seed implantation  . Arthritis   . Axillary abscess    left  . CAD (coronary artery  disease)   . Diverticulitis, colon   . History of colonic polyps   . HTN (hypertension)   . Hyperlipidemia   . Radiation proctitis    with chronic bleeding    Allergies:  Allergies  Allergen Reactions  . Viagra [Sildenafil Citrate]      Review of Systems:  Review of Systems  Constitutional: Negative for chills, fever and malaise/fatigue.  HENT: Negative for congestion, ear pain and sore throat.   Respiratory: Negative for cough, shortness of breath and wheezing.   Cardiovascular: Negative for chest pain, palpitations and leg swelling.  Gastrointestinal: Negative for blood in stool, constipation, diarrhea, heartburn and melena.  Genitourinary: Negative.   Skin: Negative.   Neurological: Negative for dizziness, sensory change, loss of consciousness and headaches.  Psychiatric/Behavioral: Negative for depression. The patient is not nervous/anxious and does not have insomnia.     Family history- Review and unchanged  Social history- Review and unchanged  Physical Exam: BP 130/80   Pulse (!) 59   Temp 97.9 F (36.6 C)   Resp 14   Ht 5\' 9"  (1.753 m)   Wt 205 lb 6.4 oz (93.2 kg)   SpO2 96%   BMI 30.33 kg/m  Wt Readings from Last 3 Encounters:  07/15/16 205 lb 6.4 oz (93.2 kg)  07/12/16 205 lb 3.2 oz (93.1 kg)  04/12/16 202 lb (91.6 kg)    General Appearance: Well nourished well developed, in no apparent distress. Eyes: PERRLA, EOMs, conjunctiva no swelling or erythema ENT/Mouth: Ear canals normal without obstruction, swelling, erythma, discharge.  TMs normal bilaterally.  Oropharynx moist, clear, without exudate, or postoropharyngeal swelling. Neck: Supple, thyroid normal,no cervical adenopathy  Respiratory: Respiratory effort normal, Breath sounds clear A&P without rhonchi, wheeze, or rale.  No retractions, no accessory usage. Cardio: RRR with systolic 2/6 murmur RSB, Brisk peripheral pulses with trace edema bilateral legs.  Abdomen: Soft, + BS,  Non tender, no  guarding, rebound, hernias, masses. Musculoskeletal: Full ROM, 5/5 strength, Normal gait Skin: Warm, dry without rashes, lesions, ecchymosis.  Neuro: Awake and oriented X 3, Cranial nerves intact. Normal muscle tone, no cerebellar symptoms. Psych: Normal affect, Insight and Judgment appropriate.    Vicie Mutters, PA-C 10:34 AM [facility]

## 2016-07-16 LAB — HEMOGLOBIN A1C
Hgb A1c MFr Bld: 6.1 % — ABNORMAL HIGH (ref <5.7)
Mean Plasma Glucose: 128 mg/dL

## 2016-08-08 ENCOUNTER — Other Ambulatory Visit: Payer: Self-pay | Admitting: Internal Medicine

## 2016-10-25 ENCOUNTER — Ambulatory Visit (INDEPENDENT_AMBULATORY_CARE_PROVIDER_SITE_OTHER): Payer: Medicare Other | Admitting: Internal Medicine

## 2016-10-25 ENCOUNTER — Encounter: Payer: Self-pay | Admitting: Internal Medicine

## 2016-10-25 VITALS — BP 138/82 | HR 56 | Temp 97.4°F | Resp 16 | Ht 70.0 in | Wt 212.0 lb

## 2016-10-25 DIAGNOSIS — Z136 Encounter for screening for cardiovascular disorders: Secondary | ICD-10-CM

## 2016-10-25 DIAGNOSIS — E559 Vitamin D deficiency, unspecified: Secondary | ICD-10-CM

## 2016-10-25 DIAGNOSIS — Z1212 Encounter for screening for malignant neoplasm of rectum: Secondary | ICD-10-CM

## 2016-10-25 DIAGNOSIS — N182 Chronic kidney disease, stage 2 (mild): Secondary | ICD-10-CM

## 2016-10-25 DIAGNOSIS — E782 Mixed hyperlipidemia: Secondary | ICD-10-CM

## 2016-10-25 DIAGNOSIS — C61 Malignant neoplasm of prostate: Secondary | ICD-10-CM

## 2016-10-25 DIAGNOSIS — I10 Essential (primary) hypertension: Secondary | ICD-10-CM

## 2016-10-25 DIAGNOSIS — I2510 Atherosclerotic heart disease of native coronary artery without angina pectoris: Secondary | ICD-10-CM | POA: Diagnosis not present

## 2016-10-25 DIAGNOSIS — M1000 Idiopathic gout, unspecified site: Secondary | ICD-10-CM

## 2016-10-25 DIAGNOSIS — E1122 Type 2 diabetes mellitus with diabetic chronic kidney disease: Secondary | ICD-10-CM

## 2016-10-25 DIAGNOSIS — Z79899 Other long term (current) drug therapy: Secondary | ICD-10-CM

## 2016-10-25 LAB — LIPID PANEL
Cholesterol: 164 mg/dL (ref <200)
HDL: 49 mg/dL (ref >40)
LDL Cholesterol: 95 mg/dL (ref <100)
TRIGLYCERIDES: 102 mg/dL (ref <150)
Total CHOL/HDL Ratio: 3.3 Ratio (ref <5.0)
VLDL: 20 mg/dL (ref <30)

## 2016-10-25 LAB — BASIC METABOLIC PANEL WITH GFR
BUN: 12 mg/dL (ref 7–25)
CO2: 30 mmol/L (ref 20–31)
Calcium: 10.2 mg/dL (ref 8.6–10.3)
Chloride: 101 mmol/L (ref 98–110)
Creat: 1.08 mg/dL (ref 0.70–1.18)
GFR, EST NON AFRICAN AMERICAN: 66 mL/min (ref >=60)
GFR, Est African American: 77 mL/min (ref >=60)
GLUCOSE: 157 mg/dL — AB (ref 65–99)
POTASSIUM: 3.9 mmol/L (ref 3.5–5.3)
Sodium: 141 mmol/L (ref 135–146)

## 2016-10-25 LAB — CBC WITH DIFFERENTIAL/PLATELET
BASOS PCT: 0 %
Basophils Absolute: 0 cells/uL (ref 0–200)
EOS ABS: 135 {cells}/uL (ref 15–500)
Eosinophils Relative: 3 %
HEMATOCRIT: 48.2 % (ref 38.5–50.0)
HEMOGLOBIN: 15.6 g/dL (ref 13.2–17.1)
LYMPHS ABS: 1845 {cells}/uL (ref 850–3900)
LYMPHS PCT: 41 %
MCH: 26.3 pg — ABNORMAL LOW (ref 27.0–33.0)
MCHC: 32.4 g/dL (ref 32.0–36.0)
MCV: 81.1 fL (ref 80.0–100.0)
MONO ABS: 405 {cells}/uL (ref 200–950)
MPV: 10.4 fL (ref 7.5–12.5)
Monocytes Relative: 9 %
Neutro Abs: 2115 cells/uL (ref 1500–7800)
Neutrophils Relative %: 47 %
Platelets: 176 10*3/uL (ref 140–400)
RBC: 5.94 MIL/uL — AB (ref 4.20–5.80)
RDW: 16.3 % — AB (ref 11.0–15.0)
WBC: 4.5 10*3/uL (ref 3.8–10.8)

## 2016-10-25 LAB — HEPATIC FUNCTION PANEL
ALBUMIN: 4.8 g/dL (ref 3.6–5.1)
ALK PHOS: 50 U/L (ref 40–115)
ALT: 23 U/L (ref 9–46)
AST: 28 U/L (ref 10–35)
BILIRUBIN INDIRECT: 0.5 mg/dL (ref 0.2–1.2)
Bilirubin, Direct: 0.2 mg/dL (ref <=0.2)
TOTAL PROTEIN: 7.7 g/dL (ref 6.1–8.1)
Total Bilirubin: 0.7 mg/dL (ref 0.2–1.2)

## 2016-10-25 LAB — PSA: PSA: <0.1 ng/mL (ref <=4.0)

## 2016-10-25 LAB — TSH: TSH: 2.24 m[IU]/L (ref 0.40–4.50)

## 2016-10-25 LAB — URIC ACID: Uric Acid, Serum: 3.3 mg/dL — ABNORMAL LOW (ref 4.0–8.0)

## 2016-10-25 LAB — MAGNESIUM: Magnesium: 1.8 mg/dL (ref 1.5–2.5)

## 2016-10-25 LAB — HEMOGLOBIN A1C
Hgb A1c MFr Bld: 6.2 % — ABNORMAL HIGH (ref <5.7)
MEAN PLASMA GLUCOSE: 131 mg/dL

## 2016-10-25 NOTE — Progress Notes (Signed)
[facility]   Unk Pinto, M.D.    [name] Colon. Silverio Lay, P.A.-C      Starlyn Skeans, P.A.-C  [facility]                [address] [place], [place] [id number] Telephone [phone] Telefax [phone] Annual  Screening/Preventative Visit  & Comprehensive Evaluation & Examination     This very nice 75 y.o. MBM presents for a Screening/Preventative Visit & comprehensive evaluation and management of multiple medical co-morbidities.  Patient has been followed for HTN, T2_NIDDM, Hyperlipidemia and Vitamin D Deficiency. 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 Patient also has hx/o PTSD and is followed for this at the [place] [facility]     HTN predates  Since about 63. Patient's BP has been controlled at home.  Today's BP is at goal - 138/82.  In 2009 he presented with ACS and had PSA/Stents placed and continues regular f/u with Dr Percival Spanish. In 2014 he had a negative stress Test. Patient denies any cardiac symptoms as chest pain, palpitations, shortness of breath, dizziness or ankle swelling.     Patient's hyperlipidemia is controlled with diet and medications. Patient denies myalgias or other medication SE's. Last lipids were at goal: Lab Results  Component Value Date   CHOL 151 07/15/2016   HDL 47 07/15/2016   LDLCALC 81 07/15/2016   TRIG 116 07/15/2016   CHOLHDL 3.2 07/15/2016      Patient has Morbid Obesity (BMI 30+) and consequent T2_NIDDM since circa 2003 and has maintained his A1c<6.5% with diet.   Patient denies reactive hypoglycemic symptoms, visual blurring, diabetic polys or paresthesias. Last A1c was still not at goal: Lab Results  Component Value Date   HGBA1C 6.1 (H) 07/15/2016       Finally, patient has history of Vitamin D Deficiency in 2012 of "38"  and last vitamin D was at goal: Lab  Results  Component Value Date   VD25OH 74 04/12/2016   Current Outpatient Prescriptions on File Prior to Visit  Medication Sig  . allopurinol (ZYLOPRIM) 300 MG tablet Take 300 mg by mouth daily.  Marland Kitchen amLODipine (NORVASC) 5 MG tablet Take 0.5 tablets (2.5 mg total) by mouth daily.  Marland Kitchen aspirin 81 MG tablet Take 81 mg by mouth daily.  Marland Kitchen atenolol (TENORMIN) 100 MG tablet take 1 tablet by mouth once daily for BLOOD PRESSURE  . citalopram (CELEXA) 40 MG tablet Take 20 mg by mouth daily.    . furosemide (LASIX) 40 MG tablet take 1 tablet by mouth once daily  . losartan-hydrochlorothiazide (HYZAAR) 100-25 MG tablet take 1 tablet by mouth once daily  . nitroGLYCERIN (NITROSTAT) 0.4 MG SL tablet Place 0.4 mg under the tongue every 5 (five) minutes as needed.    . potassium chloride SA (K-DUR,KLOR-CON) 20 MEQ tablet take 1 tablet by mouth twice a day  . pyridOXINE (VITAMIN B-6) 50 MG tablet Take 50 mg by mouth daily.    . risperiDONE (RISPERDAL) 2 MG tablet Take 1 mg by mouth at bedtime.   . simvastatin (ZOCOR) 80 MG tablet Take 40 mg by mouth at bedtime.    . Tamsulosin HCl (FLOMAX) 0.4 MG CAPS Take 0.4 mg by mouth daily.    Marland Kitchen zolpidem (AMBIEN) 10 MG tablet Take 1 tablet (10  mg total) by mouth at bedtime.   No current facility-administered medications on file prior to visit.    Allergies  Allergen Reactions  . Viagra [Sildenafil Citrate]    Past Medical History:  Diagnosis Date  . Adenocarcinoma of prostate ([place])    s/p seed implantation  . Arthritis   . Axillary abscess    left  . CAD (coronary artery disease)   . Diverticulitis, colon   . History of colonic polyps   . HTN (hypertension)   . Hyperlipidemia   . Radiation proctitis    with chronic bleeding   Health Maintenance  Topic Date Due  . OPHTHALMOLOGY EXAM  09/26/1951  . ZOSTAVAX  09/26/2000  . INFLUENZA VACCINE  06/22/2016  . PNA vac Low Risk Adult (2 of 2 - PPSV23) 09/02/2016  . FOOT EXAM  09/25/2016  . TETANUS/TDAP   11/22/2016  . HEMOGLOBIN A1C  01/15/2017  . COLONOSCOPY  09/19/2017   Immunization History  Administered Date(s) Administered  . Influenza-Unspecified 08/22/2014, 09/03/2015, 08/25/2016  . Pneumococcal Conjugate-13 09/03/2015  . Pneumococcal-Unspecified 08/25/2016  . Td 11/22/2006   Past Surgical History:  Procedure Laterality Date  . COLONOSCOPY W/ POLYPECTOMY    . CORONARY ANGIOPLASTY WITH STENT PLACEMENT  04/2008   Family History  Problem Relation Age of Onset  . Heart disease Father   . Colon cancer Neg Hx    Social History   Social History  . Marital status: Married    Spouse name: N/A  . Number of children: 2  . Years of education: N/A   Occupational History  . retired    Social History Main Topics  . Smoking status: Former Smoker    Quit date: 11/22/1992  . Smokeless tobacco: Never Used  . Alcohol use No  . Drug use: No  . Sexual activity: Not active    ROS Constitutional: Denies fever, chills, weight loss/gain, headaches, insomnia,  night sweats or change in appetite. Does c/o fatigue. Eyes: Denies redness, blurred vision, diplopia, discharge, itchy or watery eyes.  ENT: Denies discharge, congestion, post nasal drip, epistaxis, sore throat, earache, hearing loss, dental pain, Tinnitus, Vertigo, Sinus pain or snoring.  Cardio: Denies chest pain, palpitations, irregular heartbeat, syncope, dyspnea, diaphoresis, orthopnea, PND, claudication or edema Respiratory: denies cough, dyspnea, DOE, pleurisy, hoarseness, laryngitis or wheezing.  Gastrointestinal: Denies dysphagia, heartburn, reflux, water brash, pain, cramps, nausea, vomiting, bloating, diarrhea, constipation, hematemesis, melena, hematochezia, jaundice or hemorrhoids Genitourinary: Denies dysuria, frequency, urgency, nocturia, hesitancy, discharge, hematuria or flank pain Musculoskeletal: Denies arthralgia, myalgia, stiffness, Jt. Swelling, pain, limp or strain/sprain. Denies Falls. Skin: Denies puritis,  rash, hives, warts, acne, eczema or change in skin lesion Neuro: No weakness, tremor, incoordination, spasms, paresthesia or pain Psychiatric: Denies confusion, memory loss or sensory loss. Denies Depression. Endocrine: Denies change in weight, skin, hair change, nocturia, and paresthesia, diabetic polys, visual blurring or hyper / hypo glycemic episodes.  Heme/Lymph: No excessive bleeding, bruising or enlarged lymph nodes.  Physical Exam  BP 138/82   Pulse (!) 56   Temp 97.4 F (36.3 C)   Resp 16   Ht 5\' 10"  (1.778 m)   Wt 212 lb (96.2 kg)   BMI 30.42 kg/m   General Appearance: Well nourished, in no apparent distress.  Eyes: PERRLA, EOMs, conjunctiva no swelling or erythema, normal fundi and vessels. Sinuses: No frontal/maxillary tenderness ENT/Mouth: EACs patent / TMs  nl. Nares clear without erythema, swelling, mucoid exudates. Oral hygiene is good. No erythema, swelling, or exudate. Tongue normal, non-obstructing.  Tonsils not swollen or erythematous. Hearing normal.  Neck: Supple, thyroid normal. No bruits, nodes or JVD. Respiratory: Respiratory effort normal.  BS equal and clear bilateral without rales, rhonci, wheezing or stridor. Cardio: Heart sounds are normal with regular rate and rhythm and no murmurs, rubs or gallops. Peripheral pulses are normal and equal bilaterally without edema. No aortic or femoral bruits. Chest: symmetric with normal excursions and percussion.  Abdomen: Soft, with Nl bowel sounds. Nontender, no guarding, rebound, hernias, masses, or organomegaly.  Lymphatics: Non tender without lymphadenopathy.  Genitourinary: No hernias.Testes nl. DRE - prostate nl for age - smooth & firm w/o nodules. Musculoskeletal: Full ROM all peripheral extremities, joint stability, 5/5 strength, and normal gait. Skin: Warm and dry without rashes, lesions, cyanosis, clubbing or  ecchymosis.  Neuro: Cranial nerves intact, reflexes equal bilaterally. Normal muscle tone, no  cerebellar symptoms. Sensation intact to touch, vibratory & Monofilament to the toe bilaterally.  Pysch: Alert and oriented X 3 with normal affect, insight and judgment appropriate.   Assessment and Plan  1. Essential hypertension  - Microalbumin / creatinine urine ratio - EKG 12-Lead - [place], RETROPERITNL ABD,  LTD - Urinalysis, Routine w reflex microscopic  - CBC with Differential/Platelet - BASIC METABOLIC PANEL WITH GFR - TSH  2. Hyperlipidemia  - EKG 12-Lead - [place], RETROPERITNL ABD,  LTD - Hepatic function panel - Lipid panel - TSH  3. 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 Vitamin D deficiency  - VITAMIN D 25 Hydroxy   5. Idiopathic gout  - Uric acid  6. Atherosclerosis of native coronary artery without angina pectoris  - EKG 12-Lead - [place], RETROPERITNL ABD,  LTD  7. ADENOCARCINOMA, PROSTATE  - PSA  8. Screening for rectal cancer  - POC Hemoccult Bld/Stl   9. Screening for ischemic heart disease  - EKG 12-Lead  10. Screening for AAA (aortic abdominal aneurysm)  - [place], RETROPERITNL ABD,  LTD  11. Medication management  - Urinalysis, Routine w reflex microscopic - CBC with Differential/Platelet - BASIC METABOLIC PANEL WITH GFR - Hepatic function panel - Magnesium      Continue prudent diet as discussed, weight control, BP monitoring, regular exercise, and medications as discussed.  Discussed med effects and SE's. Routine screening labs and tests as requested with regular follow-up as recommended. Over 40 minutes of exam, counseling, chart review and high complex critical decision making was performed

## 2016-10-25 NOTE — Patient Instructions (Signed)

## 2016-10-26 LAB — MICROALBUMIN / CREATININE URINE RATIO
CREATININE, URINE: 202 mg/dL (ref 20–370)
MICROALB UR: 2.5 mg/dL
Microalb Creat Ratio: 12 mcg/mg creat (ref <30)

## 2016-10-26 LAB — URINALYSIS, ROUTINE W REFLEX MICROSCOPIC
Bilirubin Urine: NEGATIVE
GLUCOSE, UA: NEGATIVE
Hgb urine dipstick: NEGATIVE
KETONES UR: NEGATIVE
LEUKOCYTES UA: NEGATIVE
Nitrite: NEGATIVE
Protein, ur: NEGATIVE
SPECIFIC GRAVITY, URINE: 1.019 (ref 1.001–1.035)
pH: 7 (ref 5.0–8.0)

## 2016-10-26 LAB — INSULIN, RANDOM: INSULIN: 100.2 u[IU]/mL — AB (ref 2.0–19.6)

## 2016-10-26 LAB — VITAMIN D 25 HYDROXY (VIT D DEFICIENCY, FRACTURES): Vit D, 25-Hydroxy: 88 ng/mL (ref 30–100)

## 2016-12-15 ENCOUNTER — Other Ambulatory Visit: Payer: Self-pay | Admitting: Internal Medicine

## 2016-12-16 ENCOUNTER — Other Ambulatory Visit: Payer: Self-pay | Admitting: *Deleted

## 2016-12-16 MED ORDER — POTASSIUM CHLORIDE CRYS ER 20 MEQ PO TBCR: 20.0000 meq | EXTENDED_RELEASE_TABLET | Freq: Two times a day (BID) | ORAL | 0 refills | Status: DC

## 2017-01-10 ENCOUNTER — Other Ambulatory Visit: Payer: Self-pay | Admitting: Physician Assistant

## 2017-01-10 NOTE — Telephone Encounter (Signed)
Ambien was called into pharmacy on 19 th Feb 2018 @ 12:18pm by DD

## 2017-01-31 ENCOUNTER — Ambulatory Visit (INDEPENDENT_AMBULATORY_CARE_PROVIDER_SITE_OTHER): Payer: Medicare Other | Admitting: Internal Medicine

## 2017-01-31 ENCOUNTER — Encounter: Payer: Self-pay | Admitting: Internal Medicine

## 2017-01-31 VITALS — BP 122/60 | HR 54 | Temp 98.2°F | Resp 14 | Ht 69.0 in | Wt 207.0 lb

## 2017-01-31 DIAGNOSIS — Z79899 Other long term (current) drug therapy: Secondary | ICD-10-CM | POA: Diagnosis not present

## 2017-01-31 DIAGNOSIS — M1000 Idiopathic gout, unspecified site: Secondary | ICD-10-CM

## 2017-01-31 DIAGNOSIS — E119 Type 2 diabetes mellitus without complications: Secondary | ICD-10-CM

## 2017-01-31 DIAGNOSIS — E782 Mixed hyperlipidemia: Secondary | ICD-10-CM

## 2017-01-31 DIAGNOSIS — M25561 Pain in right knee: Secondary | ICD-10-CM

## 2017-01-31 DIAGNOSIS — E559 Vitamin D deficiency, unspecified: Secondary | ICD-10-CM

## 2017-01-31 DIAGNOSIS — I10 Essential (primary) hypertension: Secondary | ICD-10-CM | POA: Diagnosis not present

## 2017-01-31 DIAGNOSIS — G8929 Other chronic pain: Secondary | ICD-10-CM | POA: Diagnosis not present

## 2017-01-31 MED ORDER — ATENOLOL 100 MG PO TABS: 100.0000 mg | ORAL_TABLET | Freq: Every day | ORAL | 1 refills | Status: DC

## 2017-01-31 MED ORDER — MELOXICAM 7.5 MG PO TABS: 7.5000 mg | ORAL_TABLET | Freq: Every day | ORAL | 2 refills | Status: DC

## 2017-01-31 NOTE — Patient Instructions (Addendum)
Meloxicam capsules What is this medicine? MELOXICAM (mel OX i cam) is a non-steroidal anti-inflammatory drug (NSAID). It is used to reduce swelling and to treat pain. It is used for osteoarthritis. This medicine may be used for other purposes; ask your health care provider or pharmacist if you have questions. COMMON BRAND NAME(S): Vivlodex What should I tell my health care provider before I take this medicine? They need to know if you have any of these conditions: -bleeding disorders -cigarette smoker -coronary artery bypass graft (CABG) surgery within the past 2 weeks -drink more than 3 alcohol-containing drinks per day -heart disease -high blood pressure -history of stomach bleeding -kidney disease -liver disease -lung or breathing disease, like asthma -stomach or intestine problems -an unusual or allergic reaction to meloxicam, aspirin, other NSAIDs, other medicines, foods, dyes, or preservatives -pregnant or trying to get pregnant -breast-feeding How should I use this medicine? Take this medicine by mouth with a full glass of water. Follow the directions on the prescription label. You can take it with or without food. If it upsets your stomach, take it with food. Take your medicine at regular intervals. Do not take it more often than directed. Do not stop taking except on your doctor's advice. A special MedGuide will be given to you by the pharmacist with each prescription and refill. Be sure to read this information carefully each time. Talk to your pediatrician regarding the use of this medicine in children. Special care may be needed. Patients over 65 years old may have a stronger reaction and need a smaller dose. Overdosage: If you think you have taken too much of this medicine contact a poison control center or emergency room at once. NOTE: This medicine is only for you. Do not share this medicine with others. What if I miss a dose? If you miss a dose, take it as soon as you  can. If it is almost time for your next dose, take only that dose. Do not take double or extra doses. What may interact with this medicine? Do not take this medicine with any of the following medications: -cidofovir -ketorolac This medicine may also interact with the following medications: -aspirin and aspirin-like medicines -certain medicines for blood pressure, heart disease, irregular heart beat -certain medicines for depression, anxiety, or psychotic disturbances -certain medicines that treat or prevent blood clots like warfarin, enoxaparin, dalteparin, apixaban, dabigatran, rivaroxaban -cyclosporine -digoxin -diuretics -methotrexate -other NSAIDs, medicines for pain and inflammation, like ibuprofen and naproxen -pemetrexed This list may not describe all possible interactions. Give your health care provider a list of all the medicines, herbs, non-prescription drugs, or dietary supplements you use. Also tell them if you smoke, drink alcohol, or use illegal drugs. Some items may interact with your medicine. What should I watch for while using this medicine? Tell your doctor or healthcare professional if your symptoms do not start to get better or if they get worse. Do not take other medicines that contain aspirin, ibuprofen, or naproxen with this medicine. Side effects such as stomach upset, nausea, or ulcers may be more likely to occur. Many medicines available without a prescription should not be taken with this medicine. This medicine can cause ulcers and bleeding in the stomach and intestines at any time during treatment. This can happen with no warning and may cause death. There is increased risk with taking this medicine for a long time. Smoking, drinking alcohol, older age, and poor health can also increase risks. Call your doctor right away if you   have stomach pain or blood in your vomit or stool. This medicine does not prevent heart attack or stroke. In fact, this medicine may  increase the chance of a heart attack or stroke. The chance may increase with longer use of this medicine and in people who have heart disease. If you take aspirin to prevent heart attack or stroke, talk with your doctor or health care professional. What side effects may I notice from receiving this medicine? Side effects that you should report to your doctor or health care professional as soon as possible: -allergic reactions like skin rash, itching or hives, swelling of the face, lips, or tongue -nausea, vomiting -signs and symptoms of a blood clot such as breathing problems; changes in vision; chest pain; severe, sudden headache; pain, swelling, warmth in the leg; trouble speaking; sudden numbness or weakness of the face, arm, or leg -signs and symptoms of bleeding such as bloody or black, tarry stools; red or dark-brown urine; spitting up blood or brown material that looks like coffee grounds; red spots on the skin; unusual bruising or bleeding from the eye, gums, or nose -signs and symptoms of liver injury like dark yellow or brown urine; general ill feeling or flu-like symptoms; light-colored stools; loss of appetite; nausea; right upper belly pain; unusually weak or tired; yellowing of the eyes or skin -signs and symptoms of stroke like changes in vision; confusion; trouble speaking or understanding; severe headaches; sudden numbness or weakness of the face, arm, or leg; trouble walking; dizziness; loss of balance or coordination Side effects that usually do not require medical attention (report to your doctor or health care professional if they continue or are bothersome): -constipation -diarrhea -gas This list may not describe all possible side effects. Call your doctor for medical advice about side effects. You may report side effects to FDA at 1-800-FDA-1088. Where should I keep my medicine? Keep out of the reach of children. Store at room temperature between 15 and 30 degrees C (59 and 86  degrees F). Throw away any unused medicine after the expiration date. NOTE: This sheet is a summary. It may not cover all possible information. If you have questions about this medicine, talk to your doctor, pharmacist, or health care provider.  2018 Elsevier/Gold Standard (2015-12-11 10:31:18)  

## 2017-01-31 NOTE — Progress Notes (Signed)
Assessment and Plan:  Hypertension:  -well controlled -Continue medication,  -monitor blood pressure at home.  -Continue DASH diet.   -Reminder to go to the ER if any CP, SOB, nausea, dizziness, severe HA, changes vision/speech, left arm numbness and tingling, and jaw pain.  Cholesterol: -cont zocor -at goal -Continue diet and exercise.  -Check cholesterol.   Diet controlled DM, now in the prediabetic range for A1c: -Continue diet and exercise.   Vitamin D Def: -continue medications.   Chronic Insomnia -cont ambien -patient aware that this medication is not recommended for long term use or for people over the age of 79.  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  Gout -uric acid has been well controlled -cont allopurinol  Continue diet and meds as discussed. Further disposition pending results of labs.  HPI 76 y.o. male  presents for 3 month follow up with hypertension, hyperlipidemia, prediabetes and vitamin D.   His blood pressure has been controlled at home, today their BP is BP: 122/60.   He does not workout. He denies chest pain, shortness of breath, dizziness.   He is on cholesterol medication and denies myalgias. His cholesterol is at goal. The cholesterol last visit was:   Lab Results  Component Value Date   CHOL 164 10/25/2016   HDL 49 10/25/2016   LDLCALC 95 10/25/2016   TRIG 102 10/25/2016   CHOLHDL 3.3 10/25/2016     He has been working on diet and exercise for prediabetes, and denies foot ulcerations, hyperglycemia, hypoglycemia , increased appetite, nausea, paresthesia of the feet, polydipsia, polyuria, visual disturbances, vomiting and weight loss. Last A1C in the office was:  Lab Results  Component Value Date   HGBA1C 6.2 (H) 10/25/2016    Patient is on Vitamin D supplement.  Lab Results  Component Value Date   VD25OH 88 10/25/2016      His right knee has been really stiff and aching sometimes.  He reports that it is intermittent at this time.  He reports that he has not been taking any medications for it.  He does take allopurinol for gout.  He reports that this is different from his gout flares.  He reports that he feels that sometimes it feels as though it may buckle.  He has not fallen at this time.    He reports no other current problems.   He is sleeping well on his [place].  He has been on this for "years".  No dizziness, lightheadedness or actions performed while sleeping.    Current Medications:  Current Outpatient Prescriptions on File Prior to Visit  Medication Sig Dispense Refill  . allopurinol (ZYLOPRIM) 300 MG tablet Take 300 mg by mouth daily.    Marland Kitchen amLODipine (NORVASC) 5 MG tablet Take 0.5 tablets (2.5 mg total) by mouth daily. 45 tablet 3  . aspirin 81 MG tablet Take 81 mg by mouth daily.    Marland Kitchen atenolol (TENORMIN) 100 MG tablet take 1 tablet by mouth once daily for BLOOD PRESSURE 90 tablet 1  . furosemide (LASIX) 40 MG tablet take 1 tablet by mouth once daily 30 tablet 3  . losartan-hydrochlorothiazide (HYZAAR) 100-25 MG tablet take 1 tablet by mouth once daily 90 tablet 1  . nitroGLYCERIN (NITROSTAT) 0.4 MG SL tablet Place 0.4 mg under the tongue every 5 (five) minutes as needed.      Marland Kitchen  potassium chloride SA (K-DUR,KLOR-CON) 20 MEQ tablet Take 1 tablet (20 mEq total) by mouth 2 (two) times daily. 6 tablet 0  . pyridOXINE (VITAMIN B-6) 50 MG tablet Take 50 mg by mouth daily.      . risperiDONE (RISPERDAL) 2 MG tablet Take 1 mg by mouth at bedtime.     . simvastatin (ZOCOR) 80 MG tablet Take 40 mg by mouth at bedtime.      . Tamsulosin HCl (FLOMAX) 0.4 MG CAPS Take 0.4 mg by mouth daily.      Marland Kitchen zolpidem (AMBIEN) 10 MG tablet take 1 tablet by mouth at bedtime 30 tablet 5   No current facility-administered medications on file prior to visit.     Medical History:  Past Medical History:  Diagnosis Date  .  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 Arthritis   . Axillary abscess    left  . CAD (coronary artery disease)   . Diverticulitis, colon   . History of colonic polyps   . HTN (hypertension)   . Hyperlipidemia   . Radiation proctitis    with chronic bleeding    Allergies:  Allergies  Allergen Reactions  . Viagra [Sildenafil Citrate]      Review of Systems:  Review of Systems  Constitutional: Negative for chills, fever and malaise/fatigue.  HENT: Negative for congestion, ear pain and sore throat.   Eyes: Negative.   Respiratory: Negative for cough, shortness of breath and wheezing.   Cardiovascular: Negative for chest pain, palpitations and leg swelling.  Gastrointestinal: Negative for abdominal pain, blood in stool, constipation, diarrhea, heartburn and melena.  Genitourinary: Negative.   Skin: Negative.   Neurological: Negative for dizziness, sensory change, loss of consciousness and headaches.  Psychiatric/Behavioral: Negative for depression. The patient is not nervous/anxious and does not have insomnia.     Family history- Review and unchanged  Social history- Review and unchanged  Physical Exam: BP 122/60   Pulse (!) 54   Temp 98.2 F (36.8 C) (Temporal)   Resp 14   Ht 5\' 9"  (1.753 m)   Wt 207 lb (93.9 kg)   BMI 30.57 kg/m  Wt Readings from Last 3 Encounters:  01/31/17 207 lb (93.9 kg)  10/25/16 212 lb (96.2 kg)  07/15/16 205 lb 6.4 oz (93.2 kg)    General Appearance: Well nourished well developed, in no apparent distress. Eyes: PERRLA, EOMs, conjunctiva no swelling or erythema ENT/Mouth: Ear canals normal without obstruction, swelling, erythma, discharge.  TMs normal bilaterally.  Oropharynx moist, clear, without exudate, or postoropharyngeal swelling. Neck: Supple, thyroid normal,no cervical adenopathy  Respiratory: Respiratory effort normal, Breath sounds clear A&P without rhonchi, wheeze, or rale.  No retractions, no accessory  usage. Cardio: RRR with no MRGs. Brisk peripheral pulses without edema.  Abdomen: Soft, + BS,  Non tender, no guarding, rebound, hernias, masses. Musculoskeletal: Full ROM, 5/5 strength, Normal gait, right knee with tenderness to palpation of the joint line.  Ligamentous structures in tact.  Crepitus with full ROM.  Negative Mc[place]rays.  Skin: Warm, dry without rashes, lesions, ecchymosis.  Neuro: Awake and oriented X 3, Cranial nerves intact. Normal muscle tone, no cerebellar symptoms. Psych: Normal affect, Insight and Judgment appropriate.    Starlyn Skeans, PA-C 9:28 AM [facility]

## 2017-04-05 ENCOUNTER — Encounter: Payer: Self-pay | Admitting: *Deleted

## 2017-04-14 ENCOUNTER — Encounter: Payer: Self-pay | Admitting: *Deleted

## 2017-04-19 ENCOUNTER — Encounter: Payer: Self-pay | Admitting: *Deleted

## 2017-05-10 ENCOUNTER — Ambulatory Visit: Payer: Self-pay | Admitting: Internal Medicine

## 2017-06-07 ENCOUNTER — Other Ambulatory Visit: Payer: Self-pay | Admitting: Physician Assistant

## 2017-06-13 NOTE — Progress Notes (Signed)
MEDICARE ANNUAL WELLNESS VISIT AND FOLLOW UP Assessment:   Essential hypertension - continue medications, DASH diet, exercise and monitor at home. Call if greater than 130/80.  -     furosemide (LASIX) 40 MG tablet; Take 1 tablet (40 mg total) by mouth daily. -     CBC with Differential/Platelet -     BASIC METABOLIC PANEL WITH GFR -     Hepatic function panel -     TSH -     losartan-hydrochlorothiazide (HYZAAR) 100-25 MG tablet; Take 1 tablet by mouth daily.  Atherosclerosis of native coronary artery without angina pectoris, unspecified whether native or transplanted heart Control blood pressure, cholesterol, glucose, increase exercise.   Hyperlipidemia -continue medications, check lipids, decrease fatty foods, increase activity.  -     Lipid panel  Diet-controlled diabetes mellitus ([place]) Discussed general issues about diabetes pathophysiology and management., Educational material distributed., Suggested low cholesterol diet., Encouraged aerobic exercise., Discussed foot care., Reminded to get yearly retinal exam. -     Hemoglobin A1c  Benign neoplasm of colon, unspecified part of colon Continue follow up  ADENOCARCINOMA, PROSTATE Continue follow up  [place] follow up  Diverticulosis of colon Continue follow up  Medication management -     CBC with Differential/Platelet -     BASIC METABOLIC PANEL WITH GFR -     Hepatic function panel -     Magnesium  Vitamin D deficiency Continue supplement  PTSD (post-traumatic stress disorder) Continue [facility]  BMI 28.0-28.9,adult  Overweight  - long discussion about weight loss, diet, and exercise -recommended diet heavy in fruits and veggies and low in animal meats, cheeses, and dairy products  Idiopathic gout, unspecified chronicity, unspecified site Gout- recheck Uric acid as needed, Diet discussed, continue medications.  Encounter for Medicare annual wellness exam  Wheezing -     DG Chest 2  View; Future - albuterol inhaler - weight is down, no PND, + wheezing on exam, no fever, chills - get CXR, pending results may need more evaluation - any worsening symptoms go to ER  Sebaceous cyst of left axilla Schedule removal [place]  Other orders -     zolpidem (AMBIEN) 10 MG tablet; Take 1 tablet (10 mg total) by mouth at bedtime.  Over 30 minutes of exam, counseling, chart review, and critical decision making was performed  Future Appointments Date Time Provider [place]  11/23/2017 9:00 AM Unk Pinto, MD [facility] None    Plan:   During the course of the visit the patient was educated and counseled about appropriate screening and preventive services including:    Pneumococcal vaccine   Influenza vaccine  Prevnar 13  Td vaccine  Screening electrocardiogram  Colorectal cancer screening  Diabetes screening  Glaucoma screening  Nutrition counseling    Subjective:  [name] Colon is a 76 y.o. male who presents for Medicare Annual Wellness Visit and 3 month follow up for HTN, hyperlipidemia, prediabetes, and vitamin D Def.   He has bump under left arm x 1 year, itches no pain, no warmth, removed a cyst 2 years ago.  His blood pressure has been controlled at home, today their BP is BP: 136/80  He does not workout but walks up and down the stairs. He denies chest pain, shortness of breath, dizziness. Wife states he is wheezing and on exam he is audibly wheezing, weight is down, no SOB, fever, chills, congestion.  Has bilateral OA knees. Has mobic that helps some, [facility].  He is s/p stent in 2009, had normal stress test 02/2016 with Dr. Percival Spanish. 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   He is on cholesterol medication and denies myalgias. His cholesterol is at goal. The cholesterol last visit was:   Lab Results  Component Value Date   CHOL 164  10/25/2016   HDL 49 10/25/2016   LDLCALC 95 10/25/2016   TRIG 102 10/25/2016   CHOLHDL 3.3 10/25/2016   He has been working on diet and exercise for Diabetes without complications, has been in and out of DM range x 2003, currently with diet and exercise he is in the preDM range, he is on bASA, he is on ACE/ARB, and denies paresthesia of the feet, polydipsia, polyuria and visual disturbances. Last A1C was:  Lab Results  Component Value Date   HGBA1C 6.2 (H) 10/25/2016   Patient is on Vitamin D supplement.   Lab Results  Component Value Date   VD25OH 88 10/25/2016     Lab Results  Component Value Date   GFRAA 77 10/25/2016   BMI is Body mass index is 30.42 kg/m., he is working on diet and exercise. Wt Readings from Last 3 Encounters:  06/14/17 206 lb (93.4 kg)  01/31/17 207 lb (93.9 kg)  10/25/16 212 lb (96.2 kg)   Patient is on allopurinol for gout and does not report a recent flare.  Lab Results  Component Value Date   LABURIC 3.3 (L) 10/25/2016    Medication Review: Current Outpatient Prescriptions on File Prior to Visit  Medication Sig Dispense Refill  . allopurinol (ZYLOPRIM) 300 MG tablet Take 300 mg by mouth daily.    Marland Kitchen amLODipine (NORVASC) 5 MG tablet Take 0.5 tablets (2.5 mg total) by mouth daily. 45 tablet 3  . aspirin 81 MG tablet Take 81 mg by mouth daily.    Marland Kitchen atenolol (TENORMIN) 100 MG tablet Take 1 tablet (100 mg total) by mouth daily. 90 tablet 1  . escitalopram (LEXAPRO) 20 MG tablet Take 20 mg by mouth daily.    . nitroGLYCERIN (NITROSTAT) 0.4 MG SL tablet Place 0.4 mg under the tongue every 5 (five) minutes as needed.      . Potassium Chloride ER 20 MEQ TBCR TAKE 1 TABLET BY MOUTH TWICE A DAY 180 tablet 1  . pyridOXINE (VITAMIN B-6) 50 MG tablet Take 50 mg by mouth daily.      . risperiDONE (RISPERDAL) 2 MG tablet Take 1 mg by mouth at bedtime.     . simvastatin (ZOCOR) 80 MG tablet Take 40 mg by mouth at bedtime.      . Tamsulosin HCl (FLOMAX) 0.4 MG  CAPS Take 0.4 mg by mouth daily.       No current facility-administered medications on file prior to visit.     Allergies: Allergies  Allergen Reactions  . Viagra [Sildenafil Citrate]     Current Problems (verified) has ADENOCARCINOMA, PROSTATE; COLONIC POLYPS; Hyperlipidemia; Essential hypertension; Coronary atherosclerosis; RADIATION PROCTITIS; Diverticulosis of colon; Diet-controlled diabetes mellitus ([place]); Medication management; Vitamin D deficiency; PTSD (post-traumatic stress disorder); BMI 28.0-28.9,adult; and Gout on his problem list.  Screening Tests Immunization History  Administered Date(s) Administered  . Influenza-Unspecified 08/22/2014, 09/03/2015, 08/25/2016  . Pneumococcal Conjugate-13 09/03/2015  . Pneumococcal-Unspecified 08/25/2016  . Td 11/22/2006   Preventative care: Last colonoscopy: 2015 Dr. Fuller Plan CXR 2015 Ct head 2013  Prior vaccinations: TD or Tdap: 2008 declines Influenza: 2017 Pneumococcal:  2017 Prevnar13: 2016 Shingles/Zostavax: declines  Names of Other Physician/Practitioners you currently use: 1. [facility] here for primary care 2. [facility], last visit  June 2018 3. New, unknown name, dentist, last visit Jan/Feb 2018 Patient Care Team: Unk Pinto, MD as PCP - General (Internal Medicine) Minus Breeding, MD as Consulting Physician (Cardiology) Irene Shipper, MD as Consulting Physician (Gastroenterology) Kathie Rhodes, MD as Consulting Physician (Urology)  Surgical: He  has a past surgical history that includes Coronary angioplasty with stent (04/2008) and Colonoscopy w/ polypectomy. Family His family history includes Heart disease in his father. Social history  He reports that he quit smoking about 24 years ago. He has never used smokeless tobacco. He reports that he does not drink alcohol or use drugs.  MEDICARE WELLNESS OBJECTIVES: Physical activity: Current Exercise Habits:  The patient does not participate in regular exercise at present Cardiac risk factors: Cardiac Risk Factors include: advanced age (>77men, >66 women);diabetes mellitus;dyslipidemia;hypertension;male gender;obesity (BMI >30kg/m2);sedentary lifestyle Depression/mood screen:   Depression screen [facility] 2/9 06/14/2017  Decreased Interest 0  Down, Depressed, Hopeless 0  PHQ - 2 Score 0    ADLs:  In your present state of health, do you have any difficulty performing the following activities: 06/14/2017 10/25/2016  Hearing? Y N  Vision? N N  Difficulty concentrating or making decisions? N N  Walking or climbing stairs? Y N  Dressing or bathing? N N  Doing errands, shopping? N N  Some recent data might be hidden     Cognitive Testing  Alert? Yes  Normal Appearance?Yes  Oriented to person? Yes  Place? Yes   Time? Yes  Recall of three objects?  Yes  Can perform simple calculations? Yes  Displays appropriate judgment?Yes  Can read the correct time from a watch face?Yes  EOL planning: Does Patient Have a Medical Advance Directive?: No Would patient like information on creating a medical advance directive?: Yes (ED - Information included in AVS)   Objective:   Today's Vitals   06/14/17 1054  BP: 136/80  Pulse: (!) 52  Temp: (!) 97.5 F (36.4 C)  SpO2: 95%  Weight: 206 lb (93.4 kg)  Height: 5\' 9"  (1.753 m)  PainSc: 1   PainLoc: Knee   Body mass index is 30.42 kg/m.  General appearance: alert, no distress, WD/WN, male HEENT: normocephalic, sclerae anicteric, TMs pearly, nares patent, no discharge or erythema, pharynx normal Oral cavity: MMM, no lesions Neck: supple, no lymphadenopathy, no thyromegaly, no masses Heart: RRR, normal S1, S2, no murmurs Lungs: CTA bilaterally, + worse RLL but bilateral wheezes, but no rhonchi, or rales Abdomen: +bs, obese,  soft, non tender, non distended, no masses, no hepatomegaly, no splenomegaly Musculoskeletal: nontender, no swelling, no obvious  deformity Extremities: 1+ edema, no cyanosis, no clubbing Pulses: 2+ symmetric, upper and lower extremities, normal cap refill Neurological: alert, oriented x 3, CN2-12 intact, strength normal upper extremities and lower extremities, sensation normal throughout, DTRs 2+ throughout, no cerebellar signs, gait antalgic Psychiatric: normal affect, behavior normal, pleasant   Medicare Attestation I have personally reviewed: The patient's medical and social history Their use of alcohol, tobacco or illicit drugs Their current medications and supplements The patient's functional ability including ADLs,fall risks, home safety risks, cognitive, and hearing and visual impairment Diet and physical activities Evidence for depression or mood disorders  The patient's weight, height, BMI, and visual acuity have been recorded in the chart.  I have made referrals, counseling, and provided education  to the patient based on review of the above and I have provided the patient with a written personalized care plan for preventive services.     Vicie Mutters, PA-C   06/14/2017

## 2017-06-14 ENCOUNTER — Ambulatory Visit (INDEPENDENT_AMBULATORY_CARE_PROVIDER_SITE_OTHER): Payer: Medicare Other | Admitting: Physician Assistant

## 2017-06-14 ENCOUNTER — Ambulatory Visit (HOSPITAL_COMMUNITY)
Admission: RE | Admit: 2017-06-14 | Discharge: 2017-06-14 | Disposition: A | Discharge status: Home / Self Care | Payer: Medicare Other | Source: Ambulatory Visit | Attending: Physician Assistant | Admitting: Physician Assistant

## 2017-06-14 ENCOUNTER — Encounter: Payer: Self-pay | Admitting: Physician Assistant

## 2017-06-14 VITALS — BP 136/80 | HR 52 | Temp 97.5°F | Ht 69.0 in | Wt 206.0 lb

## 2017-06-14 DIAGNOSIS — Z79899 Other long term (current) drug therapy: Secondary | ICD-10-CM

## 2017-06-14 DIAGNOSIS — Z0001 Encounter for general adult medical examination with abnormal findings: Secondary | ICD-10-CM | POA: Diagnosis not present

## 2017-06-14 DIAGNOSIS — Z0000 Encounter for general adult medical examination without abnormal findings: Secondary | ICD-10-CM

## 2017-06-14 DIAGNOSIS — R6889 Other general symptoms and signs: Secondary | ICD-10-CM | POA: Diagnosis not present

## 2017-06-14 DIAGNOSIS — Z6828 Body mass index (BMI) 28.0-28.9, adult: Secondary | ICD-10-CM

## 2017-06-14 DIAGNOSIS — L723 Sebaceous cyst: Secondary | ICD-10-CM

## 2017-06-14 DIAGNOSIS — E559 Vitamin D deficiency, unspecified: Secondary | ICD-10-CM | POA: Diagnosis not present

## 2017-06-14 DIAGNOSIS — E119 Type 2 diabetes mellitus without complications: Secondary | ICD-10-CM

## 2017-06-14 DIAGNOSIS — I2510 Atherosclerotic heart disease of native coronary artery without angina pectoris: Secondary | ICD-10-CM

## 2017-06-14 DIAGNOSIS — E782 Mixed hyperlipidemia: Secondary | ICD-10-CM | POA: Diagnosis not present

## 2017-06-14 DIAGNOSIS — R062 Wheezing: Secondary | ICD-10-CM | POA: Insufficient documentation

## 2017-06-14 DIAGNOSIS — I10 Essential (primary) hypertension: Secondary | ICD-10-CM | POA: Diagnosis not present

## 2017-06-14 DIAGNOSIS — K520 Gastroenteritis and colitis due to radiation: Secondary | ICD-10-CM | POA: Diagnosis not present

## 2017-06-14 DIAGNOSIS — C61 Malignant neoplasm of prostate: Secondary | ICD-10-CM

## 2017-06-14 DIAGNOSIS — F4310 Post-traumatic stress disorder, unspecified: Secondary | ICD-10-CM

## 2017-06-14 DIAGNOSIS — D126 Benign neoplasm of colon, unspecified: Secondary | ICD-10-CM

## 2017-06-14 DIAGNOSIS — K5730 Diverticulosis of large intestine without perforation or abscess without bleeding: Secondary | ICD-10-CM | POA: Diagnosis not present

## 2017-06-14 DIAGNOSIS — J40 Bronchitis, not specified as acute or chronic: Secondary | ICD-10-CM | POA: Insufficient documentation

## 2017-06-14 DIAGNOSIS — M1000 Idiopathic gout, unspecified site: Secondary | ICD-10-CM

## 2017-06-14 LAB — CBC WITH DIFFERENTIAL/PLATELET
BASOS PCT: 1 %
Basophils Absolute: 45 cells/uL (ref 0–200)
EOS PCT: 3 %
Eosinophils Absolute: 135 cells/uL (ref 15–500)
HCT: 47 % (ref 38.5–50.0)
Hemoglobin: 15.1 g/dL (ref 13.2–17.1)
LYMPHS ABS: 2070 {cells}/uL (ref 850–3900)
LYMPHS PCT: 46 %
MCH: 25.9 pg — ABNORMAL LOW (ref 27.0–33.0)
MCHC: 32.1 g/dL (ref 32.0–36.0)
MCV: 80.8 fL (ref 80.0–100.0)
MONO ABS: 360 {cells}/uL (ref 200–950)
MPV: 10.2 fL (ref 7.5–12.5)
Monocytes Relative: 8 %
NEUTROS PCT: 42 %
Neutro Abs: 1890 cells/uL (ref 1500–7800)
PLATELETS: 179 10*3/uL (ref 140–400)
RBC: 5.82 MIL/uL — AB (ref 4.20–5.80)
RDW: 15.8 % — AB (ref 11.0–15.0)
WBC: 4.5 10*3/uL (ref 3.8–10.8)

## 2017-06-14 LAB — BASIC METABOLIC PANEL WITH GFR
BUN: 12 mg/dL (ref 7–25)
CALCIUM: 10 mg/dL (ref 8.6–10.3)
CO2: 26 mmol/L (ref 20–31)
Chloride: 102 mmol/L (ref 98–110)
Creat: 1.13 mg/dL (ref 0.70–1.18)
GFR, EST AFRICAN AMERICAN: 73 mL/min (ref >=60)
GFR, EST NON AFRICAN AMERICAN: 63 mL/min (ref >=60)
Glucose, Bld: 141 mg/dL — ABNORMAL HIGH (ref 65–99)
POTASSIUM: 3.7 mmol/L (ref 3.5–5.3)
SODIUM: 141 mmol/L (ref 135–146)

## 2017-06-14 LAB — LIPID PANEL
Cholesterol: 159 mg/dL (ref <200)
HDL: 45 mg/dL (ref >40)
LDL CALC: 90 mg/dL (ref <100)
TRIGLYCERIDES: 118 mg/dL (ref <150)
Total CHOL/HDL Ratio: 3.5 Ratio (ref <5.0)
VLDL: 24 mg/dL (ref <30)

## 2017-06-14 LAB — HEPATIC FUNCTION PANEL
ALK PHOS: 54 U/L (ref 40–115)
ALT: 21 U/L (ref 9–46)
AST: 24 U/L (ref 10–35)
Albumin: 4.5 g/dL (ref 3.6–5.1)
BILIRUBIN DIRECT: 0.1 mg/dL (ref <=0.2)
BILIRUBIN INDIRECT: 0.5 mg/dL (ref 0.2–1.2)
BILIRUBIN TOTAL: 0.6 mg/dL (ref 0.2–1.2)
TOTAL PROTEIN: 7.4 g/dL (ref 6.1–8.1)

## 2017-06-14 IMAGING — CR DG CHEST 2V
2 series · 2 of 2 positions shown · non-contrast
Comparison: [DATE]

CLINICAL DATA: Pt states he has just come from his physical, and he
has wheezing on and off over the past year, more so at night when he
is lying down, (his wife hears it when he sleeps as well), no chest
complaints of pains, does not feel short of breath.

EXAM:
CHEST  2 VIEW

[chest pa]
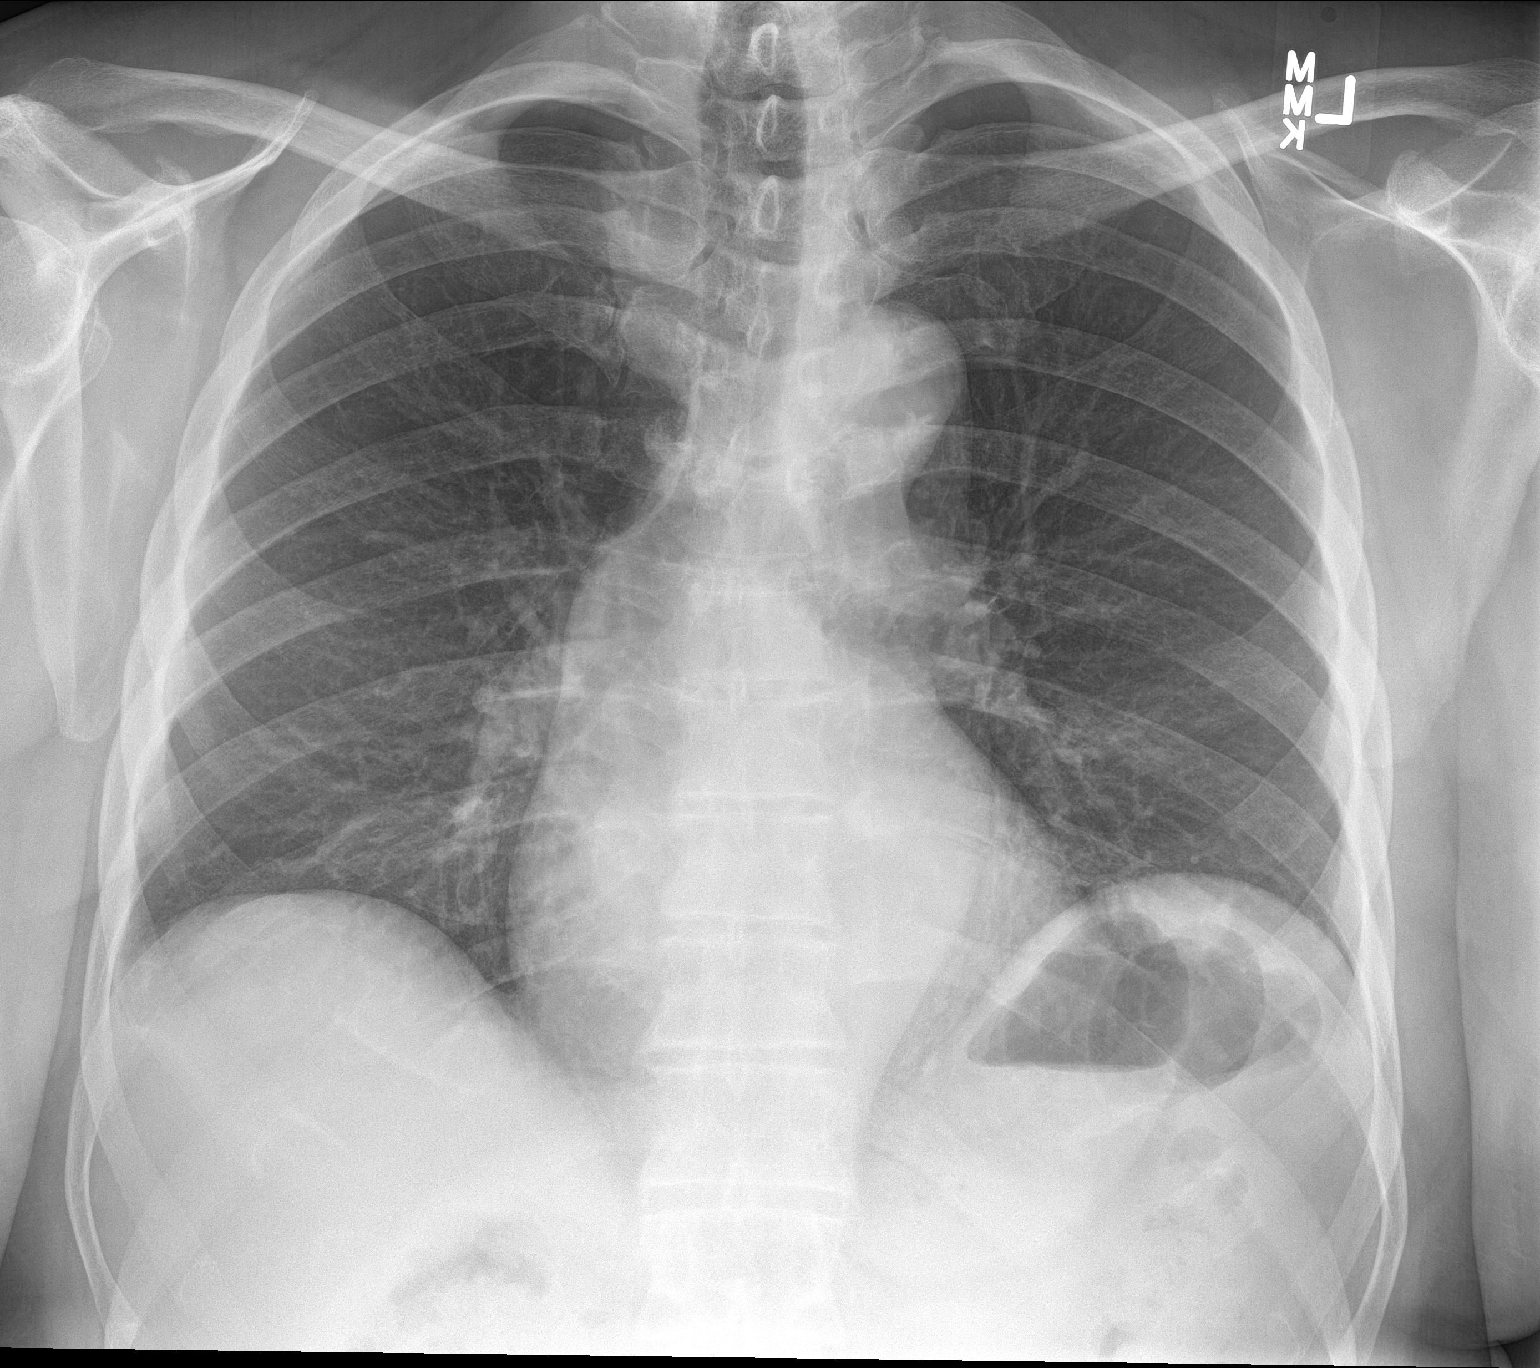

[chest lat]
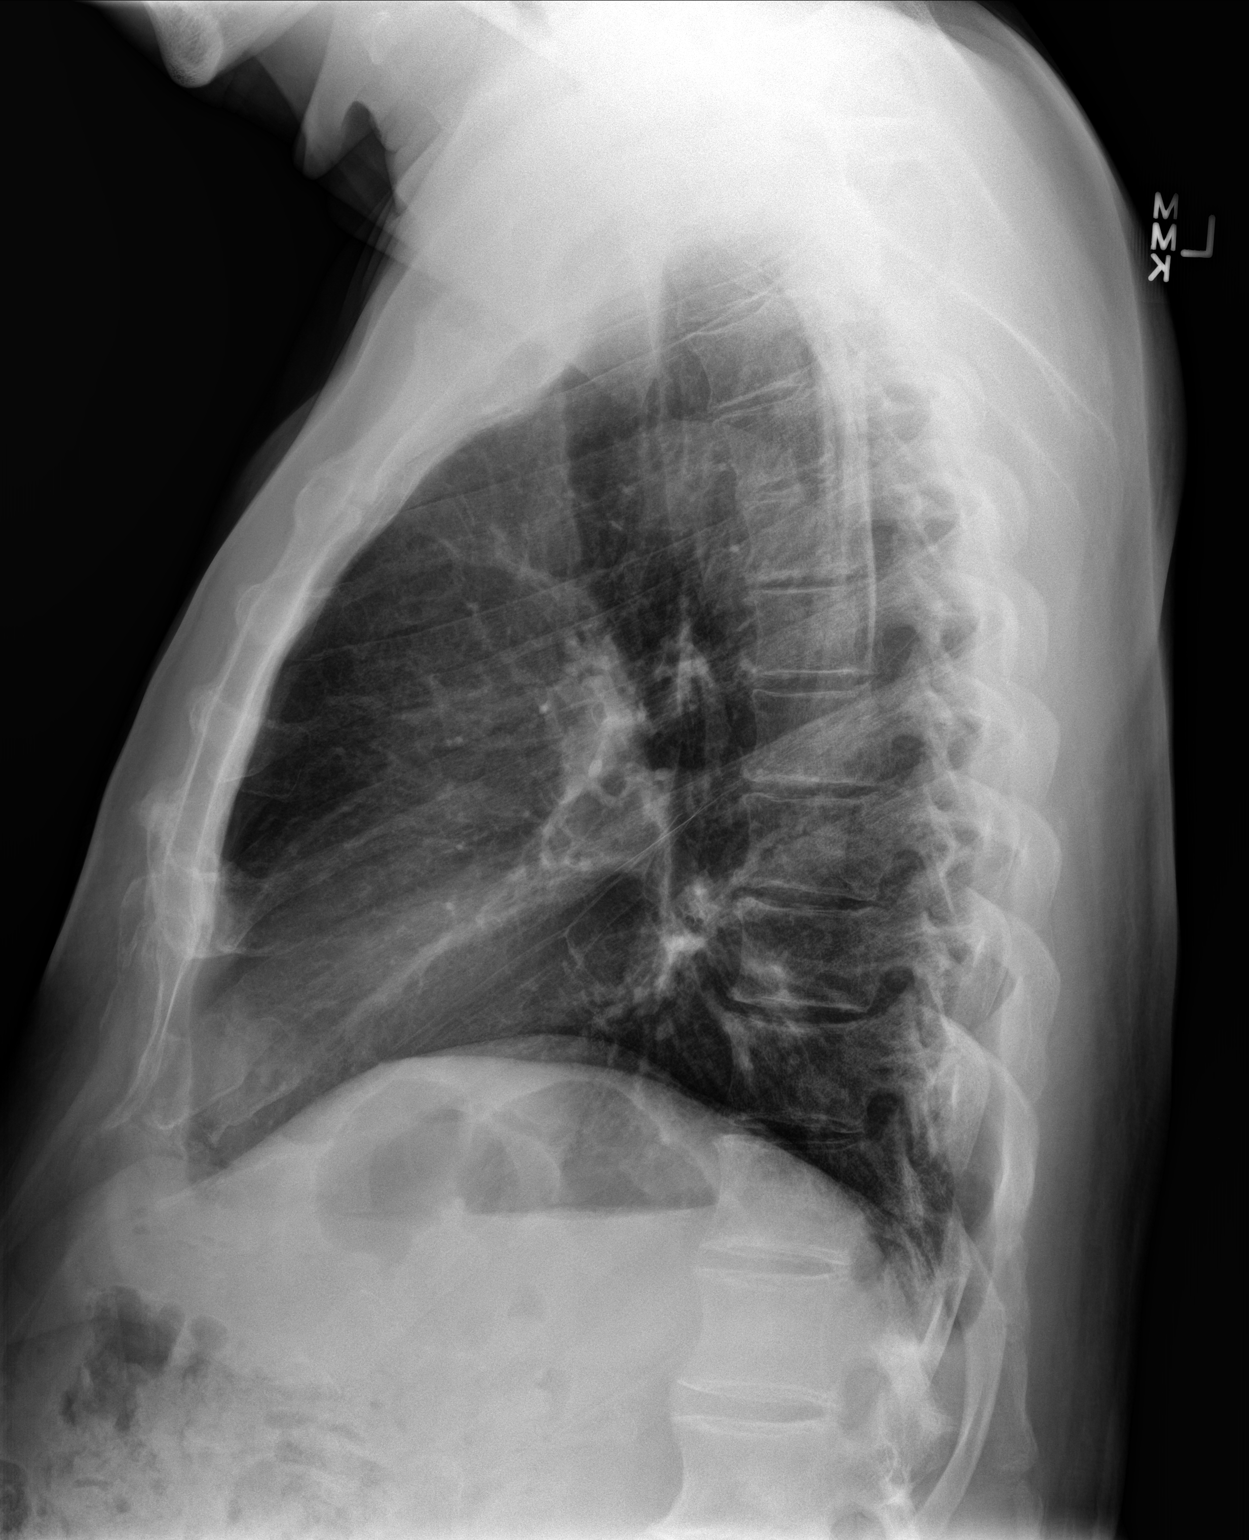

[2 of 2 positions shown; findings below may reference images not displayed]

FINDINGS: Heart size is upper limits normal. There is minimal perihilar
peribronchial thickening. There are no focal consolidations or
pleural effusions. No pulmonary edema.
IMPRESSION: 1. Bronchitic changes.
2.  No focal acute pulmonary abnormality.

## 2017-06-14 MED ORDER — LOSARTAN POTASSIUM-HCTZ 100-25 MG PO TABS: 1.0000 | ORAL_TABLET | Freq: Every day | ORAL | 1 refills | Status: DC

## 2017-06-14 MED ORDER — ALBUTEROL SULFATE HFA 108 (90 BASE) MCG/ACT IN AERS: 2.0000 | INHALATION_SPRAY | RESPIRATORY_TRACT | 0 refills | Status: DC | PRN

## 2017-06-14 MED ORDER — ZOLPIDEM TARTRATE 10 MG PO TABS: 10.0000 mg | ORAL_TABLET | Freq: Every day | ORAL | 5 refills | Status: DC

## 2017-06-14 MED ORDER — FUROSEMIDE 40 MG PO TABS: 40.0000 mg | ORAL_TABLET | Freq: Every day | ORAL | 3 refills | Status: DC

## 2017-06-14 NOTE — Patient Instructions (Signed)
Shortness of Breath, Adult Shortness of breath is when a person has trouble breathing enough air, or when a person feels like she or he is having trouble breathing in enough air. Shortness of breath could be a sign of medical problem. Follow these instructions at home: Pay attention to any changes in your symptoms. Take these actions to help with your condition:  Do not smoke. Smoking is a common cause of shortness of breath. If you smoke and you need help quitting, ask your health care provider.  Avoid things that can irritate your airways, such as: ? Mold. ? Dust. ? Air pollution. ? Chemical fumes. ? Things that can cause allergy symptoms (allergens), if you have allergies.  Keep your living space clean and free of mold and dust.  Rest as needed. Slowly return to your usual activities.  Take over-the-counter and prescription medicines, including oxygen and inhaled medicines, only as told by your health care provider.  Keep all follow-up visits as told by your health care provider. This is important.  Contact a health care provider if:  Your condition does not improve as soon as expected.  You have a hard time doing your normal activities, even after you rest.  You have new symptoms. Get help right away if:  Your shortness of breath gets worse.  You have shortness of breath when you are resting.  You feel light-headed or you faint.  You have a cough that is not controlled with medicines.  You cough up blood.  You have pain with breathing.  You have pain in your chest, arms, shoulders, or abdomen.  You have a fever.  You cannot walk up stairs or exercise the way that you normally do. This information is not intended to replace advice given to you by your health care provider. Make sure you discuss any questions you have with your health care provider. Document Released: 08/03/2001 Document Revised: 05/29/2016 Document Reviewed: 04/15/2016 Elsevier Interactive Patient  Education  2018 [employer]  

## 2017-06-14 NOTE — Progress Notes (Signed)
[name] Colon has been called into pharmacy on 24th July 2018 at 11:58am by DD

## 2017-06-15 LAB — MAGNESIUM: Magnesium: 2.1 mg/dL (ref 1.5–2.5)

## 2017-06-15 LAB — TSH: TSH: 1.89 mIU/L (ref 0.40–4.50)

## 2017-06-15 LAB — HEMOGLOBIN A1C
HEMOGLOBIN A1C: 6.2 % — AB (ref <5.7)
MEAN PLASMA GLUCOSE: 131 mg/dL

## 2017-08-22 ENCOUNTER — Other Ambulatory Visit: Payer: Self-pay

## 2017-08-22 MED ORDER — ATENOLOL 100 MG PO TABS: 100.0000 mg | ORAL_TABLET | Freq: Every day | ORAL | 1 refills | Status: DC

## 2017-09-19 NOTE — Progress Notes (Signed)
Assessment and Plan:   Essential hypertension - continue medications, DASH diet, exercise and monitor at home. Call if greater than 130/80.  -     CBC with Differential/Platelet -     BASIC METABOLIC PANEL WITH GFR -     Hepatic function panel -     TSH  Diet-controlled diabetes mellitus ([place]) Discussed general issues about diabetes pathophysiology and management., Educational material distributed., Suggested low cholesterol diet., Encouraged aerobic exercise., Discussed foot care., Reminded to get yearly retinal exam. -     Hemoglobin A1c  Hyperlipidemia -continue medications, check lipids, decrease fatty foods, increase activity.  -     Lipid panel  Medication management -     Magnesium  Sebaceous cyst of left axilla -     Ambulatory referral to General Surgery - due to location and reoccurence and may be deeper than appears will refer to Gen surg  Continue diet and meds as discussed. Further disposition pending results of labs. Discussed med's effects and SE's.   Future Appointments Date Time Provider [place]  12/28/2017 11:00 AM Unk Pinto, MD [facility] None    HPI 76 y.o. male  presents for 3 month follow up with hypertension, hyperlipidemia, diabetes and vitamin D, prostate CA. Has had cyst under left arm x 2 years, starting to bother him, itch. 3x 1.5cm, no tracking, has had removed before.   His blood pressure has been controlled at home, today their BP is BP: 120/76.  He does workout. He denies chest pain, shortness of breath, dizziness.  He is on cholesterol medication and denies myalgias. His cholesterol is at goal. The cholesterol was:  06/14/2017: Cholesterol 159; HDL 45; LDL Cholesterol 90; Triglycerides 118  He has been working on diet and exercise for diabetes with diabetic chronic kidney disease, he is on bASA, he is on ACE/ARB, and denies  paresthesia of the feet, polydipsia, polyuria and visual disturbances. Last A1C was: 06/14/2017: Hgb A1c MFr Bld  6.2  Patient is on Vitamin D supplement. 10/25/2016: Vit D, 25-Hydroxy 88  BMI is Body mass index is 30.66 kg/m., he is working on diet and exercise. Eats 2 meals a day.  Wt Readings from Last 3 Encounters:  09/20/17 207 lb 9.6 oz (94.2 kg)  06/14/17 206 lb (93.4 kg)  01/31/17 207 lb (93.9 kg)  '  Current Medications:  Current Outpatient Prescriptions on File Prior to Visit  Medication Sig Dispense Refill  . albuterol (VENTOLIN HFA) 108 (90 Base) MCG/ACT inhaler Inhale 2 puffs into the lungs every 4 (four) hours as needed for wheezing or shortness of breath. 1 Inhaler 0  . allopurinol (ZYLOPRIM) 300 MG tablet Take 300 mg by mouth daily.    Marland Kitchen amLODipine (NORVASC) 5 MG tablet Take 0.5 tablets (2.5 mg total) by mouth daily. 45 tablet 3  . aspirin 81 MG tablet Take 81 mg by mouth daily.    Marland Kitchen atenolol (TENORMIN) 100 MG tablet Take 1 tablet (100 mg total) by mouth daily. 90 tablet 1  . escitalopram (LEXAPRO) 20 MG tablet Take 20 mg by mouth daily.    . furosemide (LASIX) 40 MG tablet Take 1 tablet (40 mg total) by mouth daily. 30 tablet 3  . losartan-hydrochlorothiazide (HYZAAR) 100-25 MG tablet Take 1 tablet by mouth daily. 90 tablet 1  . nitroGLYCERIN (NITROSTAT) 0.4 MG SL tablet Place 0.4 mg under the tongue every 5 (five) minutes as needed.      . Potassium Chloride ER 20 MEQ TBCR TAKE 1 TABLET  BY MOUTH TWICE A DAY 180 tablet 1  . pyridOXINE (VITAMIN B-6) 50 MG tablet Take 50 mg by mouth daily.      . risperiDONE (RISPERDAL) 2 MG tablet Take 1 mg by mouth at bedtime.     . simvastatin (ZOCOR) 80 MG tablet Take 40 mg by mouth at bedtime.      . Tamsulosin HCl (FLOMAX) 0.4 MG CAPS Take 0.4 mg by mouth daily.      Marland Kitchen zolpidem (AMBIEN) 10 MG tablet Take 1 tablet (10 mg total) by mouth at bedtime. 30 tablet 5   No current facility-administered medications on file prior to visit.    Medical History:  Past Medical History:  Diagnosis Date  . 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 Arthritis   . Axillary abscess    left  . CAD (coronary artery disease)   . Diverticulitis, colon   . History of colonic polyps   . HTN (hypertension)   . Hyperlipidemia   . Radiation proctitis    with chronic bleeding   Allergies:  Allergies  Allergen Reactions  . Viagra [Sildenafil Citrate]      Review of Systems:  Review of Systems  Constitutional: Negative.   HENT: Negative.   Eyes: Negative.   Respiratory: Negative.   Cardiovascular: Negative.   Gastrointestinal: Negative.   Genitourinary: Negative.   Musculoskeletal: Negative.   Skin: Negative for itching and rash.  Neurological: Negative.   Endo/Heme/Allergies: Negative.   Psychiatric/Behavioral: Negative.     Family history- Review and unchanged Social history- Review and unchanged Physical Exam: BP 120/76   Pulse (!) 56   Temp (!) 97.4 F (36.3 C)   Ht 5\' 9"  (1.753 m)   Wt 207 lb 9.6 oz (94.2 kg)   SpO2 97%   BMI 30.66 kg/m  Wt Readings from Last 3 Encounters:  09/20/17 207 lb 9.6 oz (94.2 kg)  06/14/17 206 lb (93.4 kg)  01/31/17 207 lb (93.9 kg)   General Appearance: Well nourished, in no apparent distress. Eyes: PERRLA, EOMs, conjunctiva no swelling or erythema Sinuses: No Frontal/maxillary tenderness ENT/Mouth: Ext aud canals clear, TMs without erythema, bulging. No erythema, swelling, or exudate on post pharynx.  Tonsils not swollen or erythematous. Hearing normal.  Neck: Supple, thyroid normal.  Respiratory: Respiratory effort normal, BS equal bilaterally without rales, rhonchi, wheezing or stridor.  Cardio: RRR with no MRGs. Brisk peripheral pulses without edema.  Abdomen: Soft, + BS.  Non tender, no guarding, rebound, hernias, masses. Lymphatics: Non tender without lymphadenopathy.  Musculoskeletal: Full ROM, 5/5 strength, Normal gait Skin: Warm, dry  without lesions, ecchymosis.  Neuro: Cranial nerves intact. No cerebellar symptoms.  Psych: Awake and oriented X 3, normal  affect, Insight and Judgment appropriate.    Vicie Mutters, PA-C 11:22 AM [facility]

## 2017-09-20 ENCOUNTER — Encounter: Payer: Self-pay | Admitting: Internal Medicine

## 2017-09-20 ENCOUNTER — Encounter: Payer: Self-pay | Admitting: Physician Assistant

## 2017-09-20 ENCOUNTER — Ambulatory Visit (INDEPENDENT_AMBULATORY_CARE_PROVIDER_SITE_OTHER): Payer: Medicare Other | Admitting: Physician Assistant

## 2017-09-20 VITALS — BP 120/76 | HR 56 | Temp 97.4°F | Ht 69.0 in | Wt 207.6 lb

## 2017-09-20 DIAGNOSIS — L723 Sebaceous cyst: Secondary | ICD-10-CM | POA: Diagnosis not present

## 2017-09-20 DIAGNOSIS — E119 Type 2 diabetes mellitus without complications: Secondary | ICD-10-CM

## 2017-09-20 DIAGNOSIS — E782 Mixed hyperlipidemia: Secondary | ICD-10-CM

## 2017-09-20 DIAGNOSIS — I10 Essential (primary) hypertension: Secondary | ICD-10-CM | POA: Diagnosis not present

## 2017-09-20 DIAGNOSIS — Z79899 Other long term (current) drug therapy: Secondary | ICD-10-CM

## 2017-09-20 NOTE — Patient Instructions (Addendum)
Drink 80-100 oz a day of water, measure it out Eat 3 meals a day, have to do breakfast, eat protein- hard boiled eggs, protein bar like nature valley protein bar, greek yogurt like oikos triple zero, chobani 100, or light n fit greek CHANGE BREAKFAST TO EGG MCMUFFIN NOT THE BISCUIT EATS OUT FOR DINNER TOO OR OCC WIFE WILL COOK     Bad carbs also include fruit juice, alcohol, and sweet tea. These are empty calories that do not signal to your brain that you are full.   Please remember the good carbs are still carbs which convert into sugar. So please measure them out no more than 1/2-1 cup of rice, oatmeal, pasta, and beans  Veggies are however free foods! Pile them on.   Not all fruit is created equal. Please see the list below, the fruit at the bottom is higher in sugars than the fruit at the top. Please avoid all dried fruits.

## 2017-09-21 LAB — BASIC METABOLIC PANEL WITH GFR
BUN: 13 mg/dL (ref 7–25)
CALCIUM: 10 mg/dL (ref 8.6–10.3)
CHLORIDE: 102 mmol/L (ref 98–110)
CO2: 29 mmol/L (ref 20–32)
Creat: 1.18 mg/dL (ref 0.70–1.18)
GFR, EST AFRICAN AMERICAN: 69 mL/min/{1.73_m2} (ref >=60)
GFR, Est Non African American: 60 mL/min/{1.73_m2} (ref >=60)
Glucose, Bld: 146 mg/dL — ABNORMAL HIGH (ref 65–99)
POTASSIUM: 3.8 mmol/L (ref 3.5–5.3)
Sodium: 141 mmol/L (ref 135–146)

## 2017-09-21 LAB — CBC WITH DIFFERENTIAL/PLATELET
BASOS PCT: 0.4 %
Basophils Absolute: 18 cells/uL (ref 0–200)
EOS PCT: 4 %
Eosinophils Absolute: 180 cells/uL (ref 15–500)
HEMATOCRIT: 44.3 % (ref 38.5–50.0)
Hemoglobin: 14.6 g/dL (ref 13.2–17.1)
LYMPHS ABS: 2003 {cells}/uL (ref 850–3900)
MCH: 25.7 pg — ABNORMAL LOW (ref 27.0–33.0)
MCHC: 33 g/dL (ref 32.0–36.0)
MCV: 78.1 fL — AB (ref 80.0–100.0)
MPV: 10.6 fL (ref 7.5–12.5)
Monocytes Relative: 9.4 %
Neutro Abs: 1877 cells/uL (ref 1500–7800)
Neutrophils Relative %: 41.7 %
PLATELETS: 189 10*3/uL (ref 140–400)
RBC: 5.67 10*6/uL (ref 4.20–5.80)
RDW: 14.4 % (ref 11.0–15.0)
TOTAL LYMPHOCYTE: 44.5 %
WBC: 4.5 10*3/uL (ref 3.8–10.8)
WBCMIX: 423 {cells}/uL (ref 200–950)

## 2017-09-21 LAB — LIPID PANEL
CHOL/HDL RATIO: 3.8 (calc) (ref <5.0)
CHOLESTEROL: 159 mg/dL (ref <200)
HDL: 42 mg/dL (ref >40)
LDL CHOLESTEROL (CALC): 97 mg/dL
Non-HDL Cholesterol (Calc): 117 mg/dL (calc) (ref <130)
Triglycerides: 102 mg/dL (ref <150)

## 2017-09-21 LAB — HEMOGLOBIN A1C
EAG (MMOL/L): 7.4 (calc)
HEMOGLOBIN A1C: 6.3 %{Hb} — AB (ref <5.7)
MEAN PLASMA GLUCOSE: 134 (calc)

## 2017-09-21 LAB — HEPATIC FUNCTION PANEL
AG RATIO: 1.6 (calc) (ref 1.0–2.5)
ALKALINE PHOSPHATASE (APISO): 47 U/L (ref 40–115)
ALT: 19 U/L (ref 9–46)
AST: 26 U/L (ref 10–35)
Albumin: 4.5 g/dL (ref 3.6–5.1)
Bilirubin, Direct: 0.2 mg/dL (ref 0.0–0.2)
GLOBULIN: 2.8 g/dL (ref 1.9–3.7)
Indirect Bilirubin: 0.5 mg/dL (calc) (ref 0.2–1.2)
TOTAL PROTEIN: 7.3 g/dL (ref 6.1–8.1)
Total Bilirubin: 0.7 mg/dL (ref 0.2–1.2)

## 2017-09-21 LAB — MAGNESIUM: Magnesium: 2.1 mg/dL (ref 1.5–2.5)

## 2017-09-21 LAB — TSH: TSH: 1.63 mIU/L (ref 0.40–4.50)

## 2017-10-03 ENCOUNTER — Ambulatory Visit: Payer: Self-pay | Admitting: Surgery

## 2017-10-03 NOTE — H&P (View-Only) (Signed)
[name] Colon 10/03/2017 11:40 AM Location: [facility] Patient #: [id number] DOB: 19-Mar-1941 Married / Language: English / Race: Black or African American Male  History of Present Illness [name] Moores A. Jerelene Salaam MD; 10/03/2017 11:58 AM) Patient words: Patient sent at the request of [name] Mutters PA-C for cyst in his left axilla. The patient is a history of a previous sebaceous cyst in his left axilla that was excised 2 years ago. This has recurred. There is no pain or redness or drainage. The cyst does bother him that his therapy. He denies any fever or chills currently.  The patient is a 76 year old male.   Past Surgical History [name] Colon, [place]; 10/03/2017 11:40 AM) Colon Polyp Removal - Colonoscopy  Diagnostic Studies History [name] Colon, RMA; 10/03/2017 11:40 AM) Colonoscopy 1-5 years ago  Allergies [name] Colon, RMA; 10/03/2017 11:41 AM) Viagra *CARDIOVASCULAR AGENTS - MISC.*  Medication History [name] Colon, RMA; 10/03/2017 11:42 AM) Zolpidem Tartrate (10MG  Tablet, Oral) Active. Allopurinol (300MG  Tablet, Oral) Active. AmLODIPine Besylate (10MG  Tablet, Oral) Active. Atenolol (100MG  Tablet, Oral) Active. Escitalopram Oxalate (10MG  Tablet, Oral) Active. Flomax (0.4MG  Capsule, Oral) Active. Furosemide (40MG  Tablet, Oral) Active. Losartan Potassium-HCTZ (100-25MG  Tablet, Oral) Active. Potassium Chloride Crys ER (20MEQ Tablet ER, Oral) Active. ProAir HFA (108 (90 Base)MCG/ACT Aerosol Soln, Inhalation) Active. RisperiDONE (2MG  Tablet, Oral) Active. Simvastatin (40MG  Tablet, Oral) Active. Vitamin B-6 (50MG  Tablet, Oral) Active. Aspirin (81MG  Tablet, Oral) Active. Medications Reconciled  Social History [name] Colon, [place]; 10/03/2017 11:40 AM) Alcohol use Heavy alcohol use. Caffeine use Coffee. No drug use Tobacco use Former smoker.  Family History [name] Colon, [place]; 10/03/2017 11:40 AM) Alcohol Abuse  Brother, Father. Diabetes Mellitus Brother, Sister. Heart disease in male family member before age 29 Hypertension Father.  Other Problems [name] Colon, RMA; 10/03/2017 11:40 AM) Alcohol Abuse Anxiety Disorder Depression High blood pressure Hypercholesterolemia Kidney Stone Prostate Cancer     Review of Systems [name] Colon RMA; 10/03/2017 11:41 AM) General Present- Weight Gain. Not Present- Appetite Loss, Chills, Fatigue, Fever, Night Sweats and Weight Loss. Skin Not Present- Change in Wart/Mole, Dryness, Hives, Jaundice, New Lesions, Non-Healing Wounds, Rash and Ulcer. HEENT Present- Hearing Loss. Not Present- Earache, Hoarseness, Nose Bleed, Oral Ulcers, Ringing in the Ears, Seasonal Allergies, Sinus Pain, Sore Throat, Visual Disturbances, Wears glasses/contact lenses and Yellow Eyes. Respiratory Not Present- Bloody sputum, Chronic Cough, Difficulty Breathing, Snoring and Wheezing. Cardiovascular Present- Swelling of Extremities. Not Present- Chest Pain, Difficulty Breathing Lying Down, Leg Cramps, Palpitations, Rapid Heart Rate and Shortness of Breath. Gastrointestinal Not Present- Abdominal Pain, Bloating, Bloody Stool, Change in Bowel Habits, Chronic diarrhea, Constipation, Difficulty Swallowing, Excessive gas, Gets full quickly at meals, Hemorrhoids, Indigestion, Nausea, Rectal Pain and Vomiting. Male Genitourinary Present- Impotence. Not Present- Blood in Urine, Change in Urinary Stream, Frequency, Nocturia, Painful Urination, Urgency and Urine Leakage. Musculoskeletal Present- Swelling of Extremities. Not Present- Back Pain, Joint Pain, Joint Stiffness, Muscle Pain and Muscle Weakness. Psychiatric Present- Anxiety. Not Present- Bipolar, Change in Sleep Pattern, Depression, Fearful and Frequent crying. Endocrine Not Present- Cold Intolerance, Excessive Hunger, Hair Changes, Heat Intolerance, Hot flashes and New Diabetes. Hematology Not Present- Blood Thinners,  Easy Bruising, Excessive bleeding, Gland problems, HIV and Persistent Infections.  Vitals [name] Colon RMA; 10/03/2017 11:43 AM) 10/03/2017 11:43 AM Weight: 210.4 lb Height: 69in Body Surface Area: 2.11 m Body Mass Index: 31.07 kg/m  Temp.: 97.71F  Pulse: 62 (Regular)  BP: 138/80 (Sitting, Left Arm, Standard)      Physical Exam (Zeth Buday A. Junaid Wurzer MD;  10/03/2017 11:59 AM)  General Mental Status-Alert. General Appearance-Consistent with stated age. Hydration-Well hydrated. Voice-Normal.  Integumentary Note: Left axilla is a 4 cm x 3 cm epidermal inclusion cyst. It is not infected.  Chest and Lung Exam Chest and lung exam reveals -quiet, even and easy respiratory effort with no use of accessory muscles and on auscultation, normal breath sounds, no adventitious sounds and normal vocal resonance. Inspection Chest Wall - Normal. Back - normal.  Cardiovascular Cardiovascular examination reveals -normal heart sounds, regular rate and rhythm with no murmurs and normal pedal pulses bilaterally.  Neurologic Neurologic evaluation reveals -alert and oriented x 3 with no impairment of recent or remote memory. Mental Status-Normal.  Musculoskeletal Normal Exam - Left-Upper Extremity Strength Normal and Lower Extremity Strength Normal. Normal Exam - Right-Upper Extremity Strength Normal and Lower Extremity Strength Normal.  Lymphatic Axillary  General Axillary Region: Bilateral - Description - Normal. Tenderness - Non Tender.    Assessment & Plan (Hendel Gatliff A. Reinhardt Licausi MD; 10/03/2017 12:00 PM)  SEBACEOUS CYST OF LEFT AXILLA (L72.3) Impression: Patient desires excision of left axillary epidermal inclusion cyst. Risks, benefits and other therapies including nonsurgical therapies were discussed with the patient today. Risk of bleeding, infection, nerve injury, blood vessel injury, recurrence, open wound, wound complications, anesthesia risk, blood  clots, stroke, cardiovascular event, death, DVT.  Current Plans The pathophysiology of skin & subcutaneous masses was discussed. Natural history risks without surgery were discussed. I recommended surgery to remove the mass. I explained the technique of removal with use of local anesthesia & possible need for more aggressive sedation/anesthesia for patient comfort.  Risks such as bleeding, infection, wound breakdown, heart attack, death, and other risks were discussed. I noted a good likelihood this will help address the problem. Possibility that this will not correct all symptoms was explained. Possibility of regrowth/recurrence of the mass was discussed. We will work to minimize complications. Questions were answered. The patient expresses understanding & wishes to proceed with surgery.  Pt Education - CCS Free Text Education/Instructions: discussed with patient and provided information.

## 2017-10-03 NOTE — H&P (Signed)
[name] Colon 10/03/2017 11:40 AM Location: [facility] Patient #: [id number] DOB: Nov 25, 1940 Married / Language: English / Race: Black or African American Male  History of Present Illness [name] A. Ondra Deboard MD; 10/03/2017 11:58 AM) Patient words: Patient sent at the request of Vicie Mutters PA-C for cyst in his left axilla. The patient is a history of a previous sebaceous cyst in his left axilla that was excised 2 years ago. This has recurred. There is no pain or redness or drainage. The cyst does bother him that his therapy. He denies any fever or chills currently.  The patient is a 76 year old male.   Past Surgical History Malachy Moan, [place]; 10/03/2017 11:40 AM) Colon Polyp Removal - Colonoscopy  Diagnostic Studies History Malachy Moan, RMA; 10/03/2017 11:40 AM) Colonoscopy 1-5 years ago  Allergies Malachy Moan, RMA; 10/03/2017 11:41 AM) Viagra *CARDIOVASCULAR AGENTS - MISC.*  Medication History Malachy Moan, RMA; 10/03/2017 11:42 AM) Zolpidem Tartrate (10MG  Tablet, Oral) Active. Allopurinol (300MG  Tablet, Oral) Active. AmLODIPine Besylate (10MG  Tablet, Oral) Active. Atenolol (100MG  Tablet, Oral) Active. Escitalopram Oxalate (10MG  Tablet, Oral) Active. Flomax (0.4MG  Capsule, Oral) Active. Furosemide (40MG  Tablet, Oral) Active. Losartan Potassium-HCTZ (100-25MG  Tablet, Oral) Active. Potassium Chloride Crys ER (20MEQ Tablet ER, Oral) Active. ProAir HFA (108 (90 Base)MCG/ACT Aerosol Soln, Inhalation) Active. RisperiDONE (2MG  Tablet, Oral) Active. Simvastatin (40MG  Tablet, Oral) Active. Vitamin B-6 (50MG  Tablet, Oral) Active. Aspirin (81MG  Tablet, Oral) Active. Medications Reconciled  Social History Malachy Moan, [place]; 10/03/2017 11:40 AM) Alcohol use Heavy alcohol use. Caffeine use Coffee. No drug use Tobacco use Former smoker.  Family History Malachy Moan, [place]; 10/03/2017 11:40 AM) Alcohol Abuse  Brother, Father. Diabetes Mellitus Brother, Sister. Heart disease in male family member before age 59 Hypertension Father.  Other Problems Malachy Moan, RMA; 10/03/2017 11:40 AM) Alcohol Abuse Anxiety Disorder Depression High blood pressure Hypercholesterolemia Kidney Stone Prostate Cancer     Review of Systems Malachy Moan RMA; 10/03/2017 11:41 AM) General Present- Weight Gain. Not Present- Appetite Loss, Chills, Fatigue, Fever, Night Sweats and Weight Loss. Skin Not Present- Change in Wart/Mole, Dryness, Hives, Jaundice, New Lesions, Non-Healing Wounds, Rash and Ulcer. HEENT Present- Hearing Loss. Not Present- Earache, Hoarseness, Nose Bleed, Oral Ulcers, Ringing in the Ears, Seasonal Allergies, Sinus Pain, Sore Throat, Visual Disturbances, Wears glasses/contact lenses and Yellow Eyes. Respiratory Not Present- Bloody sputum, Chronic Cough, Difficulty Breathing, Snoring and Wheezing. Cardiovascular Present- Swelling of Extremities. Not Present- Chest Pain, Difficulty Breathing Lying Down, Leg Cramps, Palpitations, Rapid Heart Rate and Shortness of Breath. Gastrointestinal Not Present- Abdominal Pain, Bloating, Bloody Stool, Change in Bowel Habits, Chronic diarrhea, Constipation, Difficulty Swallowing, Excessive gas, Gets full quickly at meals, Hemorrhoids, Indigestion, Nausea, Rectal Pain and Vomiting. Male Genitourinary Present- Impotence. Not Present- Blood in Urine, Change in Urinary Stream, Frequency, Nocturia, Painful Urination, Urgency and Urine Leakage. Musculoskeletal Present- Swelling of Extremities. Not Present- Back Pain, Joint Pain, Joint Stiffness, Muscle Pain and Muscle Weakness. Psychiatric Present- Anxiety. Not Present- Bipolar, Change in Sleep Pattern, Depression, Fearful and Frequent crying. Endocrine Not Present- Cold Intolerance, Excessive Hunger, Hair Changes, Heat Intolerance, Hot flashes and New Diabetes. Hematology Not Present- Blood Thinners,  Easy Bruising, Excessive bleeding, Gland problems, HIV and Persistent Infections.  Vitals Malachy Moan RMA; 10/03/2017 11:43 AM) 10/03/2017 11:43 AM Weight: 210.4 lb Height: 69in Body Surface Area: 2.11 m Body Mass Index: 31.07 kg/m  Temp.: 97.43F  Pulse: 62 (Regular)  BP: 138/80 (Sitting, Left Arm, Standard)      Physical Exam (Sayf Kerner A. Minami Arriaga MD;  10/03/2017 11:59 AM)  General Mental Status-Alert. General Appearance-Consistent with stated age. Hydration-Well hydrated. Voice-Normal.  Integumentary Note: Left axilla is a 4 cm x 3 cm epidermal inclusion cyst. It is not infected.  Chest and Lung Exam Chest and lung exam reveals -quiet, even and easy respiratory effort with no use of accessory muscles and on auscultation, normal breath sounds, no adventitious sounds and normal vocal resonance. Inspection Chest Wall - Normal. Back - normal.  Cardiovascular Cardiovascular examination reveals -normal heart sounds, regular rate and rhythm with no murmurs and normal pedal pulses bilaterally.  Neurologic Neurologic evaluation reveals -alert and oriented x 3 with no impairment of recent or remote memory. Mental Status-Normal.  Musculoskeletal Normal Exam - Left-Upper Extremity Strength Normal and Lower Extremity Strength Normal. Normal Exam - Right-Upper Extremity Strength Normal and Lower Extremity Strength Normal.  Lymphatic Axillary  General Axillary Region: Bilateral - Description - Normal. Tenderness - Non Tender.    Assessment & Plan (Athalee Esterline A. Judiann Celia MD; 10/03/2017 12:00 PM)  SEBACEOUS CYST OF LEFT AXILLA (L72.3) Impression: Patient desires excision of left axillary epidermal inclusion cyst. Risks, benefits and other therapies including nonsurgical therapies were discussed with the patient today. Risk of bleeding, infection, nerve injury, blood vessel injury, recurrence, open wound, wound complications, anesthesia risk, blood  clots, stroke, cardiovascular event, death, DVT.  Current Plans The pathophysiology of skin & subcutaneous masses was discussed. Natural history risks without surgery were discussed. I recommended surgery to remove the mass. I explained the technique of removal with use of local anesthesia & possible need for more aggressive sedation/anesthesia for patient comfort.  Risks such as bleeding, infection, wound breakdown, heart attack, death, and other risks were discussed. I noted a good likelihood this will help address the problem. Possibility that this will not correct all symptoms was explained. Possibility of regrowth/recurrence of the mass was discussed. We will work to minimize complications. Questions were answered. The patient expresses understanding & wishes to proceed with surgery.  Pt Education - CCS Free Text Education/Instructions: discussed with patient and provided information.

## 2017-10-04 ENCOUNTER — Other Ambulatory Visit: Payer: Self-pay

## 2017-10-04 ENCOUNTER — Encounter (HOSPITAL_BASED_OUTPATIENT_CLINIC_OR_DEPARTMENT_OTHER): Payer: Self-pay | Admitting: *Deleted

## 2017-10-06 ENCOUNTER — Encounter (HOSPITAL_BASED_OUTPATIENT_CLINIC_OR_DEPARTMENT_OTHER)
Admission: RE | Disposition: A | Discharge status: Home / Self Care | Payer: Self-pay | Source: Ambulatory Visit | Attending: Surgery

## 2017-10-06 ENCOUNTER — Other Ambulatory Visit: Payer: Self-pay

## 2017-10-06 ENCOUNTER — Encounter (HOSPITAL_BASED_OUTPATIENT_CLINIC_OR_DEPARTMENT_OTHER): Payer: Self-pay | Admitting: Anesthesiology

## 2017-10-06 ENCOUNTER — Ambulatory Visit (HOSPITAL_BASED_OUTPATIENT_CLINIC_OR_DEPARTMENT_OTHER)
Admission: RE | Admit: 2017-10-06 | Discharge: 2017-10-06 | Disposition: A | Discharge status: Home / Self Care | Payer: Medicare Other | Source: Ambulatory Visit | Attending: Surgery | Admitting: Surgery

## 2017-10-06 ENCOUNTER — Ambulatory Visit (HOSPITAL_BASED_OUTPATIENT_CLINIC_OR_DEPARTMENT_OTHER): Payer: Medicare Other | Admitting: Certified Registered"

## 2017-10-06 DIAGNOSIS — F329 Major depressive disorder, single episode, unspecified: Secondary | ICD-10-CM | POA: Diagnosis not present

## 2017-10-06 DIAGNOSIS — Z8546 Personal history of malignant neoplasm of prostate: Secondary | ICD-10-CM | POA: Diagnosis not present

## 2017-10-06 DIAGNOSIS — Z8249 Family history of ischemic heart disease and other diseases of the circulatory system: Secondary | ICD-10-CM | POA: Insufficient documentation

## 2017-10-06 DIAGNOSIS — Z87891 Personal history of nicotine dependence: Secondary | ICD-10-CM | POA: Insufficient documentation

## 2017-10-06 DIAGNOSIS — Z7982 Long term (current) use of aspirin: Secondary | ICD-10-CM | POA: Insufficient documentation

## 2017-10-06 DIAGNOSIS — M1990 Unspecified osteoarthritis, unspecified site: Secondary | ICD-10-CM | POA: Diagnosis not present

## 2017-10-06 DIAGNOSIS — F419 Anxiety disorder, unspecified: Secondary | ICD-10-CM | POA: Diagnosis not present

## 2017-10-06 DIAGNOSIS — E7800 Pure hypercholesterolemia, unspecified: Secondary | ICD-10-CM | POA: Diagnosis not present

## 2017-10-06 DIAGNOSIS — I2510 Atherosclerotic heart disease of native coronary artery without angina pectoris: Secondary | ICD-10-CM | POA: Insufficient documentation

## 2017-10-06 DIAGNOSIS — Z888 Allergy status to other drugs, medicaments and biological substances status: Secondary | ICD-10-CM | POA: Insufficient documentation

## 2017-10-06 DIAGNOSIS — Z86010 Personal history of colonic polyps: Secondary | ICD-10-CM | POA: Insufficient documentation

## 2017-10-06 DIAGNOSIS — Z79899 Other long term (current) drug therapy: Secondary | ICD-10-CM | POA: Diagnosis not present

## 2017-10-06 DIAGNOSIS — L720 Epidermal cyst: Secondary | ICD-10-CM | POA: Insufficient documentation

## 2017-10-06 DIAGNOSIS — I10 Essential (primary) hypertension: Secondary | ICD-10-CM | POA: Insufficient documentation

## 2017-10-06 DIAGNOSIS — Z955 Presence of coronary angioplasty implant and graft: Secondary | ICD-10-CM | POA: Diagnosis not present

## 2017-10-06 DIAGNOSIS — L723 Sebaceous cyst: Secondary | ICD-10-CM | POA: Diagnosis present

## 2017-10-06 HISTORY — PX: MASS EXCISION: SHX2000

## 2017-10-06 SURGERY — EXCISION MASS
Anesthesia: General | Site: Axilla | Laterality: Left

## 2017-10-06 MED ORDER — ACETAMINOPHEN 500 MG PO TABS
1000.0000 mg | ORAL_TABLET | ORAL | Status: AC
  Administered 2017-10-06: 1000 mg via ORAL

## 2017-10-06 MED ORDER — FENTANYL CITRATE (PF) 100 MCG/2ML IJ SOLN
50.0000 ug | INTRAMUSCULAR | Status: DC | PRN
  Administered 2017-10-06 (×2): 50 ug via INTRAVENOUS

## 2017-10-06 MED ORDER — OXYCODONE HCL 5 MG/5ML PO SOLN: 5.0000 mg | Freq: Once | ORAL | Status: DC | PRN

## 2017-10-06 MED ORDER — GABAPENTIN 300 MG PO CAPS
ORAL_CAPSULE | ORAL | Status: AC
  Filled 2017-10-06: qty 1

## 2017-10-06 MED ORDER — PROPOFOL 10 MG/ML IV BOLUS
INTRAVENOUS | Status: DC | PRN
Start: 2017-10-06 — End: 2017-10-06
  Administered 2017-10-06: 150 mg via INTRAVENOUS

## 2017-10-06 MED ORDER — LACTATED RINGERS IV SOLN: INTRAVENOUS | Status: DC

## 2017-10-06 MED ORDER — CHLORHEXIDINE GLUCONATE CLOTH 2 % EX PADS: 6.0000 | MEDICATED_PAD | Freq: Once | CUTANEOUS | Status: DC

## 2017-10-06 MED ORDER — SUCCINYLCHOLINE CHLORIDE 200 MG/10ML IV SOSY
PREFILLED_SYRINGE | INTRAVENOUS | Status: AC
Start: 2017-10-06 — End: ?
  Filled 2017-10-06: qty 10

## 2017-10-06 MED ORDER — GABAPENTIN 300 MG PO CAPS
300.0000 mg | ORAL_CAPSULE | ORAL | Status: AC
Start: 2017-10-06 — End: 2017-10-06
  Administered 2017-10-06: 300 mg via ORAL

## 2017-10-06 MED ORDER — ONDANSETRON HCL 4 MG/2ML IJ SOLN
INTRAMUSCULAR | Status: AC
  Filled 2017-10-06: qty 2

## 2017-10-06 MED ORDER — DEXAMETHASONE SODIUM PHOSPHATE 4 MG/ML IJ SOLN
INTRAMUSCULAR | Status: DC | PRN
Start: 2017-10-06 — End: 2017-10-06
  Administered 2017-10-06: 8 mg via INTRAVENOUS

## 2017-10-06 MED ORDER — FENTANYL CITRATE (PF) 100 MCG/2ML IJ SOLN: 25.0000 ug | INTRAMUSCULAR | Status: DC | PRN

## 2017-10-06 MED ORDER — IBUPROFEN 800 MG PO TABS: 800.0000 mg | ORAL_TABLET | Freq: Three times a day (TID) | ORAL | 0 refills | Status: DC | PRN

## 2017-10-06 MED ORDER — LIDOCAINE 2% (20 MG/ML) 5 ML SYRINGE
INTRAMUSCULAR | Status: AC
  Filled 2017-10-06: qty 5

## 2017-10-06 MED ORDER — OXYCODONE HCL 5 MG PO TABS: 5.0000 mg | ORAL_TABLET | Freq: Once | ORAL | Status: DC | PRN

## 2017-10-06 MED ORDER — CELECOXIB 200 MG PO CAPS
200.0000 mg | ORAL_CAPSULE | ORAL | Status: AC
  Administered 2017-10-06: 200 mg via ORAL

## 2017-10-06 MED ORDER — ACETAMINOPHEN 500 MG PO TABS
ORAL_TABLET | ORAL | Status: AC
  Filled 2017-10-06: qty 2

## 2017-10-06 MED ORDER — MEPERIDINE HCL 25 MG/ML IJ SOLN: 6.2500 mg | INTRAMUSCULAR | Status: DC | PRN

## 2017-10-06 MED ORDER — CEFAZOLIN SODIUM-DEXTROSE 2-4 GM/100ML-% IV SOLN
INTRAVENOUS | Status: AC
  Filled 2017-10-06: qty 100

## 2017-10-06 MED ORDER — PROMETHAZINE HCL 25 MG/ML IJ SOLN: 6.2500 mg | INTRAMUSCULAR | Status: DC | PRN

## 2017-10-06 MED ORDER — EPHEDRINE 5 MG/ML INJ
INTRAVENOUS | Status: AC
  Filled 2017-10-06: qty 10

## 2017-10-06 MED ORDER — LACTATED RINGERS IV SOLN
INTRAVENOUS | Status: DC
  Administered 2017-10-06: 10:00:00 via INTRAVENOUS

## 2017-10-06 MED ORDER — FENTANYL CITRATE (PF) 100 MCG/2ML IJ SOLN
INTRAMUSCULAR | Status: AC
  Filled 2017-10-06: qty 2

## 2017-10-06 MED ORDER — CEFAZOLIN SODIUM-DEXTROSE 2-4 GM/100ML-% IV SOLN
2.0000 g | INTRAVENOUS | Status: AC
  Administered 2017-10-06: 2 g via INTRAVENOUS

## 2017-10-06 MED ORDER — CELECOXIB 200 MG PO CAPS
ORAL_CAPSULE | ORAL | Status: AC
  Filled 2017-10-06: qty 1

## 2017-10-06 MED ORDER — SCOPOLAMINE 1 MG/3DAYS TD PT72: 1.0000 | MEDICATED_PATCH | Freq: Once | TRANSDERMAL | Status: DC | PRN

## 2017-10-06 MED ORDER — MIDAZOLAM HCL 2 MG/2ML IJ SOLN: 1.0000 mg | INTRAMUSCULAR | Status: DC | PRN

## 2017-10-06 MED ORDER — LIDOCAINE HCL (CARDIAC) 20 MG/ML IV SOLN
INTRAVENOUS | Status: DC | PRN
Start: 2017-10-06 — End: 2017-10-06
  Administered 2017-10-06: 50 mg via INTRAVENOUS

## 2017-10-06 MED ORDER — PHENYLEPHRINE 40 MCG/ML (10ML) SYRINGE FOR IV PUSH (FOR BLOOD PRESSURE SUPPORT)
PREFILLED_SYRINGE | INTRAVENOUS | Status: AC
  Filled 2017-10-06: qty 10

## 2017-10-06 MED ORDER — EPHEDRINE SULFATE 50 MG/ML IJ SOLN
INTRAMUSCULAR | Status: DC | PRN
  Administered 2017-10-06: 10 mg via INTRAVENOUS

## 2017-10-06 MED ORDER — PROPOFOL 500 MG/50ML IV EMUL
INTRAVENOUS | Status: AC
  Filled 2017-10-06: qty 50

## 2017-10-06 MED ORDER — BUPIVACAINE-EPINEPHRINE 0.25% -1:200000 IJ SOLN
INTRAMUSCULAR | Status: DC | PRN
  Administered 2017-10-06: 10 mL

## 2017-10-06 MED ORDER — HYDROCODONE-ACETAMINOPHEN 5-325 MG PO TABS: 1.0000 | ORAL_TABLET | Freq: Four times a day (QID) | ORAL | 0 refills | Status: DC | PRN

## 2017-10-06 MED ORDER — MIDAZOLAM HCL 2 MG/2ML IJ SOLN
INTRAMUSCULAR | Status: AC
  Filled 2017-10-06: qty 2

## 2017-10-06 MED ORDER — DEXAMETHASONE SODIUM PHOSPHATE 10 MG/ML IJ SOLN
INTRAMUSCULAR | Status: AC
  Filled 2017-10-06: qty 1

## 2017-10-06 SURGICAL SUPPLY — 43 items
ADH SKN CLS APL DERMABOND .7 (GAUZE/BANDAGES/DRESSINGS) ×1
APL SKNCLS STERI-STRIP NONHPOA (GAUZE/BANDAGES/DRESSINGS)
BENZOIN TINCTURE PRP APPL 2/3 (GAUZE/BANDAGES/DRESSINGS) IMPLANT
BLADE SURG 10 STRL SS (BLADE) IMPLANT
BLADE SURG 15 STRL LF DISP TIS (BLADE) ×1 IMPLANT
BLADE SURG 15 STRL SS (BLADE) ×3
CANISTER SUCT 1200ML W/VALVE (MISCELLANEOUS) IMPLANT
CHLORAPREP W/TINT 26ML (MISCELLANEOUS) ×3 IMPLANT
CLOSURE WOUND 1/2 X4 (GAUZE/BANDAGES/DRESSINGS)
COVER BACK TABLE 60X90IN (DRAPES) ×3 IMPLANT
COVER MAYO STAND STRL (DRAPES) ×3 IMPLANT
DECANTER SPIKE VIAL GLASS SM (MISCELLANEOUS) IMPLANT
DERMABOND ADVANCED (GAUZE/BANDAGES/DRESSINGS) ×2
DERMABOND ADVANCED .7 DNX12 (GAUZE/BANDAGES/DRESSINGS) IMPLANT
DRAPE LAPAROTOMY 100X72 PEDS (DRAPES) ×3 IMPLANT
DRAPE UTILITY XL STRL (DRAPES) ×3 IMPLANT
ELECT COATED BLADE 2.86 ST (ELECTRODE) ×3 IMPLANT
ELECT REM PT RETURN 9FT ADLT (ELECTROSURGICAL) ×3
ELECTRODE REM PT RTRN 9FT ADLT (ELECTROSURGICAL) ×1 IMPLANT
GLOVE BIO SURGEON STRL SZ7 (GLOVE) ×2 IMPLANT
GLOVE BIOGEL PI IND STRL 8 (GLOVE) ×1 IMPLANT
GLOVE BIOGEL PI INDICATOR 8 (GLOVE) ×2
GLOVE ECLIPSE 8.0 STRL XLNG CF (GLOVE) ×3 IMPLANT
GOWN STRL REUS W/ TWL LRG LVL3 (GOWN DISPOSABLE) ×2 IMPLANT
GOWN STRL REUS W/TWL LRG LVL3 (GOWN DISPOSABLE) ×6
NDL HYPO 25X1 1.5 SAFETY (NEEDLE) ×1 IMPLANT
NEEDLE HYPO 25X1 1.5 SAFETY (NEEDLE) ×3 IMPLANT
NS IRRIG 1000ML POUR BTL (IV SOLUTION) IMPLANT
PACK BASIN DAY SURGERY FS (CUSTOM PROCEDURE TRAY) ×3 IMPLANT
PENCIL BUTTON HOLSTER BLD 10FT (ELECTRODE) ×3 IMPLANT
SLEEVE SCD COMPRESS KNEE MED (MISCELLANEOUS) ×3 IMPLANT
SPONGE LAP 4X18 X RAY DECT (DISPOSABLE) ×2 IMPLANT
STAPLER VISISTAT 35W (STAPLE) IMPLANT
STRIP CLOSURE SKIN 1/2X4 (GAUZE/BANDAGES/DRESSINGS) IMPLANT
SUT MON AB 4-0 PC3 18 (SUTURE) ×3 IMPLANT
SUT VICRYL 3-0 CR8 SH (SUTURE) ×3 IMPLANT
SUT VICRYL AB 3 0 TIES (SUTURE) IMPLANT
SYR CONTROL 10ML LL (SYRINGE) ×3 IMPLANT
TOWEL OR 17X24 6PK STRL BLUE (TOWEL DISPOSABLE) ×6 IMPLANT
TOWEL OR NON WOVEN STRL DISP B (DISPOSABLE) ×3 IMPLANT
TUBE CONNECTING 20'X1/4 (TUBING)
TUBE CONNECTING 20X1/4 (TUBING) IMPLANT
YANKAUER SUCT BULB TIP NO VENT (SUCTIONS) IMPLANT

## 2017-10-06 NOTE — Discharge Instructions (Signed)
1,000mg  tylenol given at 9:30am   GENERAL SURGERY: POST OP INSTRUCTIONS  ######################################################################  EAT Gradually transition to a high fiber diet with a fiber supplement over the next few weeks after discharge.  Start with a pureed / full liquid diet (see below)  WALK Walk an hour a day.  Control your pain to do that.    CONTROL PAIN Control pain so that you can walk, sleep, tolerate sneezing/coughing, go up/down stairs.  HAVE A BOWEL MOVEMENT DAILY Keep your bowels regular to avoid problems.  OK to try a laxative to override constipation.  OK to use an antidairrheal to slow down diarrhea.  Call if not better after 2 tries  CALL IF YOU HAVE PROBLEMS/CONCERNS Call if you are still struggling despite following these instructions. Call if you have concerns not answered by these instructions  ######################################################################    1. DIET: Follow a light bland diet the first 24 hours after arrival home, such as soup, liquids, crackers, etc.  Be sure to include lots of fluids daily.  Avoid fast food or heavy meals as your are more likely to get nauseated.   2. Take your usually prescribed home medications unless otherwise directed. 3. PAIN CONTROL: a. Pain is best controlled by a usual combination of three different methods TOGETHER: i. Ice/Heat ii. Over the counter pain medication iii. Prescription pain medication b. Most patients will experience some swelling and bruising around the incisions.  Ice packs or heating pads (30-60 minutes up to 6 times a day) will help. Use ice for the first few days to help decrease swelling and bruising, then switch to heat to help relax tight/sore spots and speed recovery.  Some people prefer to use ice alone, heat alone, alternating between ice & heat.  Experiment to what works for you.  Swelling and bruising can take several weeks to resolve.   c. It is helpful to take  an over-the-counter pain medication regularly for the first few weeks.  Choose one of the following that works best for you: i. Naproxen (Aleve, etc)  Two 220mg  tabs twice a day ii. Ibuprofen (Advil, etc) Three 200mg  tabs four times a day (every meal & bedtime) iii. Acetaminophen (Tylenol, etc) 500-650mg  four times a day (every meal & bedtime) d. A  prescription for pain medication (such as oxycodone, hydrocodone, etc) should be given to you upon discharge.  Take your pain medication as prescribed.  i. If you are having problems/concerns with the prescription medicine (does not control pain, nausea, vomiting, rash, itching, etc), please call us [phone] to see if we need to switch you to a different pain medicine that will work better for you and/or control your side effect better. ii. If you need a refill on your pain medication, please contact your pharmacy.  They will contact our office to request authorization. Prescriptions will not be filled after 5 pm or on week-ends. 4. Avoid getting constipated.  Between the surgery and the pain medications, it is common to experience some constipation.  Increasing fluid intake and taking a fiber supplement (such as Metamucil, Citrucel, FiberCon, MiraLax, etc) 1-2 times a day regularly will usually help prevent this problem from occurring.  A mild laxative (prune juice, Milk of Magnesia, MiraLax, etc) should be taken according to package directions if there are no bowel movements after 48 hours.   5. Wash / shower every day.  You may shower over the dressings as they are waterproof.  Continue to shower over incision(s) after the dressing  is off. 6. Remove your waterproof bandages 5 days after surgery.  You may leave the incision open to air.  You may have skin tapes (Steri Strips) covering the incision(s).  Leave them on until one week, then remove.  You may replace a dressing/Band-Aid to cover the incision for comfort if you wish.      7. ACTIVITIES as  tolerated:   a. You may resume regular (light) daily activities beginning the next day--such as daily self-care, walking, climbing stairs--gradually increasing activities as tolerated.  If you can walk 30 minutes without difficulty, it is safe to try more intense activity such as jogging, treadmill, bicycling, low-impact aerobics, swimming, etc. b. Save the most intensive and strenuous activity for last such as sit-ups, heavy lifting, contact sports, etc  Refrain from any heavy lifting or straining until you are off narcotics for pain control.   c. DO NOT PUSH THROUGH PAIN.  Let pain be your guide: If it hurts to do something, don't do it.  Pain is your body warning you to avoid that activity for another week until the pain goes down. d. You may drive when you are no longer taking prescription pain medication, you can comfortably wear a seatbelt, and you can safely maneuver your car and apply brakes. e. Dennis Bast may have sexual intercourse when it is comfortable.  8. FOLLOW UP in our office a. Please call [facility] at [phone] to set up an appointment to see your surgeon in the office for a follow-up appointment approximately 2-3 weeks after your surgery. b. Make sure that you call for this appointment the day you arrive home to insure a convenient appointment time. 9. IF YOU HAVE DISABILITY OR FAMILY LEAVE FORMS, BRING THEM TO THE OFFICE FOR PROCESSING.  DO NOT GIVE THEM TO YOUR DOCTOR.   WHEN TO CALL us [phone]: 1. Poor pain control 2. Reactions / problems with new medications (rash/itching, nausea, etc)  3. Fever over 101.5 F (38.5 C) 4. Worsening swelling or bruising 5. Continued bleeding from incision. 6. Increased pain, redness, or drainage from the incision 7. Difficulty breathing / swallowing   The clinic staff is available to answer your questions during regular business hours (8:30am-5pm).  Please dont hesitate to call and ask to speak to one of our nurses for clinical concerns.    If you have a medical emergency, go to the nearest emergency room or call 911.  A surgeon from [facility] is always on call at the [facility], [place], [place], [place], [place]  [address] ? MAIN: [phone] ? TOLL FREE: [phone] ?  FAX [phone] [email or web address]   Post Anesthesia Home Care Instructions  Activity: Get plenty of rest for the remainder of the day. A responsible individual must stay with you for 24 hours following the procedure.  For the next 24 hours, DO NOT: -Drive a car -Paediatric nurse -Drink alcoholic beverages -Take any medication unless instructed by your physician -Make any legal decisions or sign important papers.  Meals: Start with liquid foods such as gelatin or soup. Progress to regular foods as tolerated. Avoid greasy, spicy, heavy foods. If nausea and/or vomiting occur, drink only clear liquids until the nausea and/or vomiting subsides. Call your physician if vomiting continues.  Special Instructions/Symptoms: Your throat may feel dry or sore from the anesthesia or the breathing tube placed in your throat during surgery. If this causes discomfort, gargle with warm salt water. The  discomfort should disappear within 24 hours.  If you had a scopolamine patch placed behind your ear for the management of post- operative nausea and/or vomiting:  1. The medication in the patch is effective for 72 hours, after which it should be removed.  Wrap patch in a tissue and discard in the trash. Wash hands thoroughly with soap and water. 2. You may remove the patch earlier than 72 hours if you experience unpleasant side effects which may include dry mouth, dizziness or visual disturbances. 3. Avoid touching the patch. Wash your hands with soap and water after contact with the patch.

## 2017-10-06 NOTE — Interval H&P Note (Signed)
History and Physical Interval Note:  10/06/2017 9:29 AM  [name] Colon  has presented today for surgery, with the diagnosis of LEFT AXILLA CYST 2CM  The various methods of treatment have been discussed with the patient and family. After consideration of risks, benefits and other options for treatment, the patient has consented to  Procedure(s): EXCISION LEFT AXILLA CYST (Left) as a surgical intervention .  The patient's history has been reviewed, patient examined, no change in status, stable for surgery.  I have reviewed the patient's chart and labs.  Questions were answered to the patient's satisfaction.     [name] A.

## 2017-10-06 NOTE — Anesthesia Preprocedure Evaluation (Addendum)
Anesthesia Evaluation  Patient identified by MRN, date of birth, ID band Patient awake    Reviewed: Allergy & Precautions, NPO status , Patient's Chart, lab work & pertinent test results  Airway Mallampati: II  TM Distance: >3 FB Neck ROM: Full    Dental  (+) Dental Advisory Given, Teeth Intact   Pulmonary neg pulmonary ROS, former smoker,    breath sounds clear to auscultation       Cardiovascular hypertension, + CAD and + Cardiac Stents   Rhythm:Regular Rate:Normal     Neuro/Psych PSYCHIATRIC DISORDERS Anxiety negative neurological ROS     GI/Hepatic negative GI ROS, Neg liver ROS,   Endo/Other  negative endocrine ROS  Renal/GU negative Renal ROS     Musculoskeletal  (+) Arthritis ,   Abdominal   Peds  Hematology negative hematology ROS (+)   Anesthesia Other Findings Day of surgery medications reviewed with the patient.  Reproductive/Obstetrics                            Anesthesia Physical Anesthesia Plan  ASA: II  Anesthesia Plan: General   Post-op Pain Management:    Induction: Intravenous  PONV Risk Score and Plan: 3 and Ondansetron and Dexamethasone  Airway Management Planned: LMA  Additional Equipment:   Intra-op Plan:   Post-operative Plan: Extubation in OR  Informed Consent: I have reviewed the patients History and Physical, chart, labs and discussed the procedure including the risks, benefits and alternatives for the proposed anesthesia with the patient or authorized representative who has indicated his/her understanding and acceptance.   Dental advisory given  Plan Discussed with: CRNA  Anesthesia Plan Comments:        Anesthesia Quick Evaluation

## 2017-10-06 NOTE — Transfer of Care (Signed)
Immediate Anesthesia Transfer of Care Note  Patient: ORYN CASANOVA  Procedure(s) Performed: EXCISION LEFT AXILLA CYST (Left Axilla)  Patient Location: PACU  Anesthesia Type:General  Level of Consciousness: sedated  Airway & Oxygen Therapy: Patient Spontanous Breathing and Patient connected to face mask oxygen  Post-op Assessment: Report given to RN and Post -op Vital signs reviewed and stable  Post vital signs: Reviewed and stable  Last Vitals:  Vitals:   10/06/17 0929  BP: (!) 152/82  Pulse: (!) 53  Resp: 18  Temp: 36.7 C  SpO2: 98%    Last Pain:  Vitals:   10/06/17 0929  TempSrc: Oral      Patients Stated Pain Goal: 3 (71/24/58 0998)  Complications: No apparent anesthesia complications

## 2017-10-06 NOTE — Op Note (Signed)
Preoperative diagnosis: Left axillary cyst subcutaneous 4 cm x 3 cm  Postoperative diagnosis: Same  Procedure: Excision of left axillary cyst subcutaneous 4 cm x 3 cm  Surgeon: Erroll Luna MD  Anesthesia: Local  EBL: 20 cc  Specimen: Cyst to pathology  Drains: None  IV fluids: Per anesthesia record  Indications for procedure: The patient presents for excision of a 4 cm cyst in his left axilla.  This appeared to be an epidermal inclusion cyst.  This was causing pain discomfort.The procedure has been discussed with the patient.  Alternative therapies have been discussed with the patient.  Operative risks include bleeding,  Infection,  Organ injury,  Nerve injury,  Blood vessel injury,  DVT,  Pulmonary embolism,  Death,  And possible reoperation.  Medical management risks include worsening of present situation.  The success of the procedure is 50 -90 % at treating patients symptoms.  The patient understands and agrees to proceed.  Description of procedure: The patient was met in the holding area.  The operative site was marked in the holding area as the left axilla.  Questions were answered.  He was taken back to the operating room placed upon the OR table.  After induction of LMA anesthesia, left axillary region was prepped and draped in sterile fashion.  Timeout was done.  A curvilinear incision was made above and below the cyst.  The entire cyst was excised into the subcutaneous tissue.  The wound was closed with 3-0 Vicryl and 4-0 Monocryl.  All final counts were found to be correct.  The patient was awoke extubated taken to recovery in satisfactory condition.

## 2017-10-06 NOTE — Anesthesia Procedure Notes (Signed)
Procedure Name: LMA Insertion Date/Time: 10/06/2017 9:57 AM Performed by: Willa Frater, CRNA Pre-anesthesia Checklist: Patient identified, Emergency Drugs available, Suction available and Patient being monitored Patient Re-evaluated:Patient Re-evaluated prior to induction Oxygen Delivery Method: Circle system utilized Preoxygenation: Pre-oxygenation with 100% oxygen Induction Type: IV induction Ventilation: Mask ventilation without difficulty LMA: LMA inserted LMA Size: 4.0 Number of attempts: 1 Airway Equipment and Method: Bite block Placement Confirmation: positive ETCO2 Tube secured with: Tape Dental Injury: Teeth and Oropharynx as per pre-operative assessment

## 2017-10-06 NOTE — Anesthesia Postprocedure Evaluation (Signed)
Anesthesia Post Note  Patient: [name] Colon  Procedure(s) Performed: EXCISION LEFT AXILLA CYST (Left Axilla)     Patient location during evaluation: PACU Anesthesia Type: General Level of consciousness: sedated Pain management: pain level controlled Vital Signs Assessment: post-procedure vital signs reviewed and stable Respiratory status: spontaneous breathing and respiratory function stable Cardiovascular status: stable Postop Assessment: no apparent nausea or vomiting Anesthetic complications: no    Last Vitals:  Vitals:   10/06/17 1100 10/06/17 1130  BP: (!) 144/83 (!) 148/78  Pulse: 60 61  Resp: 15 16  Temp:  (!) 36.3 C  SpO2: 98% 97%    Last Pain:  Vitals:   10/06/17 1130  TempSrc:   PainSc: 0-No pain                 Shalisa Mcquade DANIEL

## 2017-10-07 ENCOUNTER — Encounter (HOSPITAL_BASED_OUTPATIENT_CLINIC_OR_DEPARTMENT_OTHER): Payer: Self-pay | Admitting: Surgery

## 2017-10-07 NOTE — Addendum Note (Signed)
Addendum  created 10/07/17 1224 by Tawni Millers, CRNA   Charge Capture section accepted

## 2017-11-23 ENCOUNTER — Encounter: Payer: Self-pay | Admitting: Internal Medicine

## 2017-12-23 ENCOUNTER — Other Ambulatory Visit: Payer: Self-pay | Admitting: Internal Medicine

## 2017-12-28 ENCOUNTER — Ambulatory Visit (INDEPENDENT_AMBULATORY_CARE_PROVIDER_SITE_OTHER): Payer: Medicare Other | Admitting: Internal Medicine

## 2017-12-28 ENCOUNTER — Encounter: Payer: Self-pay | Admitting: Internal Medicine

## 2017-12-28 VITALS — BP 124/68 | HR 48 | Temp 97.3°F | Resp 16 | Ht 69.0 in | Wt 208.0 lb

## 2017-12-28 DIAGNOSIS — M1000 Idiopathic gout, unspecified site: Secondary | ICD-10-CM | POA: Diagnosis not present

## 2017-12-28 DIAGNOSIS — E1122 Type 2 diabetes mellitus with diabetic chronic kidney disease: Secondary | ICD-10-CM

## 2017-12-28 DIAGNOSIS — N182 Chronic kidney disease, stage 2 (mild): Secondary | ICD-10-CM | POA: Diagnosis not present

## 2017-12-28 DIAGNOSIS — Z8546 Personal history of malignant neoplasm of prostate: Secondary | ICD-10-CM

## 2017-12-28 DIAGNOSIS — Z125 Encounter for screening for malignant neoplasm of prostate: Secondary | ICD-10-CM | POA: Diagnosis not present

## 2017-12-28 DIAGNOSIS — I2510 Atherosclerotic heart disease of native coronary artery without angina pectoris: Secondary | ICD-10-CM

## 2017-12-28 DIAGNOSIS — Z136 Encounter for screening for cardiovascular disorders: Secondary | ICD-10-CM | POA: Diagnosis not present

## 2017-12-28 DIAGNOSIS — E559 Vitamin D deficiency, unspecified: Secondary | ICD-10-CM

## 2017-12-28 DIAGNOSIS — F4310 Post-traumatic stress disorder, unspecified: Secondary | ICD-10-CM

## 2017-12-28 DIAGNOSIS — I10 Essential (primary) hypertension: Secondary | ICD-10-CM

## 2017-12-28 DIAGNOSIS — Z1211 Encounter for screening for malignant neoplasm of colon: Secondary | ICD-10-CM

## 2017-12-28 DIAGNOSIS — E782 Mixed hyperlipidemia: Secondary | ICD-10-CM | POA: Diagnosis not present

## 2017-12-28 DIAGNOSIS — Z87891 Personal history of nicotine dependence: Secondary | ICD-10-CM

## 2017-12-28 DIAGNOSIS — Z1212 Encounter for screening for malignant neoplasm of rectum: Secondary | ICD-10-CM

## 2017-12-28 DIAGNOSIS — Z0001 Encounter for general adult medical examination with abnormal findings: Secondary | ICD-10-CM

## 2017-12-28 DIAGNOSIS — Z8249 Family history of ischemic heart disease and other diseases of the circulatory system: Secondary | ICD-10-CM

## 2017-12-28 DIAGNOSIS — Z79899 Other long term (current) drug therapy: Secondary | ICD-10-CM

## 2017-12-28 NOTE — Patient Instructions (Signed)

## 2017-12-28 NOTE — Progress Notes (Signed)
[facility]   Unk Pinto, M.D.     Uvaldo Bristle. Silverio Lay, P.A.-C Liane Comber, [place] [address]                [place], [place] [address] Telephone [phone] Telefax [phone] Annual  Screening/Preventative Visit  & Comprehensive Evaluation & Examination     This very nice 77 y.o. MBM presents for a Screening/Preventative Visit & comprehensive evaluation and management of multiple medical co-morbidities.  Patient has been followed for HTN, T2_NIDDM  , Hyperlipidemia and Vitamin D Deficiency. Gout controlled on Allopurinol. 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  Patient has been release from active f/u.Other problems include PTSD followed at the [facility]/[place].     HTN predates circa 1990. Patient's BP has been controlled at home.  Today's BP is at goal - 124/68. Patient has ASCAD s/p PCA/Stents in 2009 per Dr Percival Spanish. Stress Test in 2014 was Negative. Patient denies any cardiac symptoms as chest pain, palpitations, shortness of breath, dizziness or ankle swelling.     Patient's hyperlipidemia is controlled with diet and medications. Patient denies myalgias or other medication SE's. Last lipids were at goal: Lab Results  Component Value Date   CHOL 159 09/20/2017   HDL 42 09/20/2017   LDLCALC 90 06/14/2017   TRIG 102 09/20/2017   CHOLHDL 3.8 09/20/2017      Patient has Morbid Obesity (BMI 30+) and T2_NIDDM (2003) is attempting control with diet.  He denies reactive hypoglycemic symptoms, visual blurring, diabetic polys or paresthesias. Last A1c was still not at goal: Lab Results  Component Value Date   HGBA1C 6.3 (H) 09/20/2017       Finally, patient has history of Vitamin D Deficiency ("38" on Tx in 2012)   and last vitamin D was at goal: Lab Results  Component Value Date   VD25OH 88 10/25/2016    Current Outpatient Medications on File Prior to Visit  Medication Sig  . albuterol (VENTOLIN HFA) 108 (90 Base) MCG/ACT inhaler Inhale 2 puffs into the lungs every 4 (four) hours as needed for wheezing or shortness of breath.  . allopurinol (ZYLOPRIM) 300 MG tablet Take 300 mg by mouth daily.  Marland Kitchen amLODipine (NORVASC) 5 MG tablet Take 0.5 tablets (2.5 mg total) by mouth daily.  Marland Kitchen aspirin 81 MG tablet Take 81 mg by mouth daily.  Marland Kitchen atenolol (TENORMIN) 100 MG tablet Take 1 tablet (100 mg total) by mouth daily.  Marland Kitchen escitalopram (LEXAPRO) 20 MG tablet Take 20 mg by mouth daily.  . furosemide (LASIX) 40 MG tablet Take 1 tablet (40 mg total) by mouth daily.  Marland Kitchen losartan-hydrochlorothiazide (HYZAAR) 100-25 MG tablet Take 1 tablet by mouth daily.  . nitroGLYCERIN (NITROSTAT) 0.4 MG SL tablet Place 0.4 mg under the tongue every 5 (five) minutes as needed.    . potassium chloride SA (K-DUR,KLOR-CON) 20 MEQ tablet TAKE 1 TABLET BY MOUTH TWICE A DAY  . pyridOXINE (VITAMIN B-6) 50 MG tablet Take 50 mg by mouth daily.    . risperiDONE (RISPERDAL) 2 MG tablet Take 1 mg by mouth at bedtime.   . simvastatin (ZOCOR) 80 MG tablet Take 40 mg by mouth at bedtime.    . Tamsulosin HCl (FLOMAX) 0.4 MG CAPS Take 0.4 mg by mouth daily.    Marland Kitchen zolpidem (AMBIEN)  10 MG tablet Take 1 tablet (10 mg total) by mouth at bedtime.   No current facility-administered medications on file prior to visit.    No Known Allergies Past Medical History:  Diagnosis Date  . Adenocarcinoma of prostate ([place])    s/p seed implantation  . Arthritis   . Axillary abscess    left  . CAD (coronary artery disease)   . Diverticulitis, colon   . History of colonic polyps   . HTN (hypertension)   . Hyperlipidemia   . Radiation proctitis    with chronic bleeding   Health Maintenance  Topic Date Due  . COLONOSCOPY  09/19/2017  . TETANUS/TDAP  07/03/2018 (Originally 11/22/2016)  . HEMOGLOBIN A1C  03/21/2018  . OPHTHALMOLOGY EXAM  04/27/2018   . FOOT EXAM  06/14/2018  . INFLUENZA VACCINE  Completed  . PNA vac Low Risk Adult  Completed   Immunization History  Administered Date(s) Administered  . Influenza-Unspecified 08/22/2014, 09/03/2015, 08/25/2016  . Pneumococcal Conjugate-13 09/03/2015  . Pneumococcal-Unspecified 08/25/2016  . Td 11/22/2006   Last Colon - 09/19/2014 - Dr Henrene Pastor & advised patient 3 yr f/u. Patient is awaiting notification. Past Surgical History:  Procedure Laterality Date  . COLONOSCOPY W/ POLYPECTOMY    . CORONARY ANGIOPLASTY WITH STENT PLACEMENT  04/2008  . MASS EXCISION Left 10/06/2017   Procedure: EXCISION LEFT AXILLA CYST;  Surgeon: Erroll Luna, MD;  Location: [facility];  Service: General;  Laterality: Left;   Family History  Problem Relation Age of Onset  . Heart disease Father   . Colon cancer Neg Hx    Socioeconomic History  . Marital status: Married    Spouse name: Not on file  . Number of children: 2  Occupational History  . Occupation: retired    Fish farm manager: Retired  Immunologist  . Smoking status: Former Smoker    Last attempt to quit: 11/22/1992    Years since quitting: 25.1  . Smokeless tobacco: Never Used  Substance and Sexual Activity  . Alcohol use: No  . Drug use: No  . Sexual activity: No    ROS Constitutional: Denies fever, chills, weight loss/gain, headaches, insomnia,  night sweats or change in appetite. Does c/o fatigue. Eyes: Denies redness, blurred vision, diplopia, discharge, itchy or watery eyes.  ENT: Denies discharge, congestion, post nasal drip, epistaxis, sore throat, earache, hearing loss, dental pain, Tinnitus, Vertigo, Sinus pain or snoring.  Cardio: Denies chest pain, palpitations, irregular heartbeat, syncope, dyspnea, diaphoresis, orthopnea, PND, claudication or edema Respiratory: denies cough, dyspnea, DOE, pleurisy, hoarseness, laryngitis or wheezing.  Gastrointestinal: Denies dysphagia, heartburn, reflux, water brash, pain, cramps,  nausea, vomiting, bloating, diarrhea, constipation, hematemesis, melena, hematochezia, jaundice or hemorrhoids Genitourinary: Denies dysuria, frequency, urgency, nocturia, hesitancy, discharge, hematuria or flank pain Musculoskeletal: Denies arthralgia, myalgia, stiffness, Jt. Swelling, pain, limp or strain/sprain. Denies Falls. Skin: Denies puritis, rash, hives, warts, acne, eczema or change in skin lesion Neuro: No weakness, tremor, incoordination, spasms, paresthesia or pain Psychiatric: Denies confusion, memory loss or sensory loss. Denies Depression. Endocrine: Denies change in weight, skin, hair change, nocturia, and paresthesia, diabetic polys, visual blurring or hyper / hypo glycemic episodes.  Heme/Lymph: No excessive bleeding, bruising or enlarged lymph nodes.  Physical Exam  BP 124/68   Pulse (!) 48   Temp (!) 97.3 F (36.3 C)   Resp 16   Ht 5\' 9"  (1.753 m)   Wt 208 lb (94.3 kg)   BMI 30.72 kg/m   General Appearance: Over nourished and well  groomed and in no apparent distress.  Eyes: PERRLA, EOMs, conjunctiva no swelling or erythema, normal fundi and vessels. Sinuses: No frontal/maxillary tenderness ENT/Mouth: EACs patent / TMs  nl. Nares clear without erythema, swelling, mucoid exudates. Oral hygiene is good. No erythema, swelling, or exudate. Tongue normal, non-obstructing. Tonsils not swollen or erythematous. Hearing normal.  Neck: Supple, thyroid normal. No bruits, nodes or JVD. Respiratory: Respiratory effort normal.  BS equal and clear bilateral without rales, rhonci, wheezing or stridor. Cardio: Heart sounds are normal with regular rate and rhythm and no murmurs, rubs or gallops. Peripheral pulses are normal and equal bilaterally without edema. No aortic or femoral bruits. Chest: symmetric with normal excursions and percussion.  Abdomen: Soft, with Nl bowel sounds. Nontender, no guarding, rebound, hernias, masses, or organomegaly.  Lymphatics: Non tender without  lymphadenopathy.  Genitourinary:  DRE - deferred to Upcoming Colonoscopy. Musculoskeletal: Full ROM all peripheral extremities, joint stability, 5/5 strength, and normal gait. Skin: Warm and dry without rashes, lesions, cyanosis, clubbing or  ecchymosis.  Neuro: Cranial nerves intact, reflexes equal bilaterally. Normal muscle tone, no cerebellar symptoms. Sensation intact to touch, vibratory and Monofilament to the toes bilaterally. Pysch: Alert and oriented X 3 with normal affect, insight and judgment appropriate.   Assessment and Plan  1. Annual Preventative/Screening Exam   2. Essential hypertension  - EKG 12-Lead - [place], RETROPERITNL ABD,  LTD - Urinalysis, Routine w reflex microscopic - Microalbumin / creatinine urine ratio - CBC with Differential/Platelet - BASIC METABOLIC PANEL WITH GFR - Magnesium - TSH  3. Hyperlipidemia, mixed  - EKG 12-Lead - [place], RETROPERITNL ABD,  LTD - Hepatic function panel - Lipid panel - TSH  4. Type 2 diabetes mellitus with stage 2 chronic kidney disease, without long-term current use of insulin (HCC)  - EKG 12-Lead - [place], RETROPERITNL ABD,  LTD - Urinalysis, Routine w reflex microscopic - Microalbumin / creatinine urine ratio - HM DIABETES FOOT EXAM - LOW EXTREMITY NEUR EXAM DOCUM - Hemoglobin A1c - Insulin, random  5. Vitamin D deficiency  - VITAMIN D 25 Hydroxy   6. Atherosclerosis of native coronary artery of native heart without angina pectoris  - EKG 12-Lead - Lipid panel  7. Idiopathic gout, unspecified chronicity, unspecified site  - Uric acid  8. Screening for colorectal cancer  - POC Hemoccult Bld/Stl  - Ambulatory referral to Gastroenterology  9. History of prostate cancer  - PSA  10. Prostate cancer screening  - PSA  11. Screening for ischemic heart disease  - EKG 12-Lead - Lipid panel  12. Family history of heart disease  - EKG 12-Lead - [place], RETROPERITNL ABD,  LTD - Lipid panel  13. Former  smoker  - EKG 12-Lead - [place], RETROPERITNL ABD,  LTD  14. Screening for AAA (aortic abdominal aneurysm)  - [place], RETROPERITNL ABD,  LTD  15. PTSD (post-traumatic stress disorder)   16. Medication management  - Urinalysis, Routine w reflex microscopic - Microalbumin / creatinine urine ratio - BASIC METABOLIC PANEL WITH GFR - Hepatic function panel - Magnesium - Lipid panel - TSH - Hemoglobin A1c - Insulin, random - VITAMIN D 25 Hydroxy - Uric acid         Patient was counseled in prudent diet, weight control to achieve/maintain BMI less than 25, BP monitoring, regular exercise and medications as discussed.  Discussed med effects and SE's. Routine screening labs and tests as requested with regular follow-up as recommended. Over 40 minutes of exam, counseling, chart review and high  complex critical decision making was performed

## 2017-12-29 LAB — URINALYSIS, ROUTINE W REFLEX MICROSCOPIC
BILIRUBIN URINE: NEGATIVE
GLUCOSE, UA: NEGATIVE
HGB URINE DIPSTICK: NEGATIVE
Ketones, ur: NEGATIVE
LEUKOCYTES UA: NEGATIVE
Nitrite: NEGATIVE
Protein, ur: NEGATIVE
Specific Gravity, Urine: 1.016 (ref 1.001–1.03)
pH: 7 (ref 5.0–8.0)

## 2017-12-29 LAB — CBC WITH DIFFERENTIAL/PLATELET
BASOS ABS: 20 {cells}/uL (ref 0–200)
BASOS PCT: 0.5 %
EOS PCT: 3.3 %
Eosinophils Absolute: 132 cells/uL (ref 15–500)
HCT: 47.2 % (ref 38.5–50.0)
HEMOGLOBIN: 15.3 g/dL (ref 13.2–17.1)
Lymphs Abs: 1692 cells/uL (ref 850–3900)
MCH: 25.8 pg — ABNORMAL LOW (ref 27.0–33.0)
MCHC: 32.4 g/dL (ref 32.0–36.0)
MCV: 79.5 fL — ABNORMAL LOW (ref 80.0–100.0)
MONOS PCT: 9.8 %
MPV: 11.6 fL (ref 7.5–12.5)
NEUTROS ABS: 1764 {cells}/uL (ref 1500–7800)
Neutrophils Relative %: 44.1 %
PLATELETS: 186 10*3/uL (ref 140–400)
RBC: 5.94 10*6/uL — ABNORMAL HIGH (ref 4.20–5.80)
RDW: 14.4 % (ref 11.0–15.0)
TOTAL LYMPHOCYTE: 42.3 %
WBC mixed population: 392 cells/uL (ref 200–950)
WBC: 4 10*3/uL (ref 3.8–10.8)

## 2017-12-29 LAB — HEPATIC FUNCTION PANEL
AG RATIO: 1.7 (calc) (ref 1.0–2.5)
ALKALINE PHOSPHATASE (APISO): 50 U/L (ref 40–115)
ALT: 20 U/L (ref 9–46)
AST: 25 U/L (ref 10–35)
Albumin: 4.7 g/dL (ref 3.6–5.1)
BILIRUBIN INDIRECT: 0.5 mg/dL (ref 0.2–1.2)
Bilirubin, Direct: 0.2 mg/dL (ref 0.0–0.2)
Globulin: 2.8 g/dL (calc) (ref 1.9–3.7)
TOTAL PROTEIN: 7.5 g/dL (ref 6.1–8.1)
Total Bilirubin: 0.7 mg/dL (ref 0.2–1.2)

## 2017-12-29 LAB — BASIC METABOLIC PANEL WITH GFR
BUN/Creatinine Ratio: 11 (calc) (ref 6–22)
BUN: 13 mg/dL (ref 7–25)
CHLORIDE: 102 mmol/L (ref 98–110)
CO2: 30 mmol/L (ref 20–32)
CREATININE: 1.2 mg/dL — AB (ref 0.70–1.18)
Calcium: 10.3 mg/dL (ref 8.6–10.3)
GFR, Est African American: 67 mL/min/{1.73_m2} (ref >=60)
GFR, Est Non African American: 58 mL/min/{1.73_m2} — ABNORMAL LOW (ref >=60)
GLUCOSE: 115 mg/dL — AB (ref 65–99)
POTASSIUM: 3.8 mmol/L (ref 3.5–5.3)
SODIUM: 141 mmol/L (ref 135–146)

## 2017-12-29 LAB — TSH: TSH: 2.18 mIU/L (ref 0.40–4.50)

## 2017-12-29 LAB — LIPID PANEL
CHOLESTEROL: 167 mg/dL (ref <200)
HDL: 46 mg/dL (ref >40)
LDL Cholesterol (Calc): 105 mg/dL (calc) — ABNORMAL HIGH
Non-HDL Cholesterol (Calc): 121 mg/dL (calc) (ref <130)
Total CHOL/HDL Ratio: 3.6 (calc) (ref <5.0)
Triglycerides: 75 mg/dL (ref <150)

## 2017-12-29 LAB — URIC ACID: URIC ACID, SERUM: 5.3 mg/dL (ref 4.0–8.0)

## 2017-12-29 LAB — MICROALBUMIN / CREATININE URINE RATIO
Creatinine, Urine: 154 mg/dL (ref 20–320)
MICROALB/CREAT RATIO: 10 ug/mg{creat} (ref <30)
Microalb, Ur: 1.5 mg/dL

## 2017-12-29 LAB — HEMOGLOBIN A1C
Hgb A1c MFr Bld: 6.2 % of total Hgb — ABNORMAL HIGH (ref <5.7)
Mean Plasma Glucose: 131 (calc)
eAG (mmol/L): 7.3 (calc)

## 2017-12-29 LAB — VITAMIN D 25 HYDROXY (VIT D DEFICIENCY, FRACTURES): Vit D, 25-Hydroxy: 85 ng/mL (ref 30–100)

## 2017-12-29 LAB — MAGNESIUM: MAGNESIUM: 2.1 mg/dL (ref 1.5–2.5)

## 2017-12-29 LAB — INSULIN, RANDOM: Insulin: 9.3 u[IU]/mL (ref 2.0–19.6)

## 2017-12-29 LAB — PSA

## 2018-01-01 ENCOUNTER — Encounter: Payer: Self-pay | Admitting: Internal Medicine

## 2018-02-08 ENCOUNTER — Ambulatory Visit (AMBULATORY_SURGERY_CENTER): Payer: Self-pay

## 2018-02-08 VITALS — Ht 69.0 in | Wt 209.0 lb

## 2018-02-08 DIAGNOSIS — Z86010 Personal history of colonic polyps: Secondary | ICD-10-CM

## 2018-02-08 MED ORDER — NA SULFATE-K SULFATE-MG SULF 17.5-3.13-1.6 GM/177ML PO SOLN: 1.0000 | Freq: Once | ORAL | 0 refills | Status: AC

## 2018-02-08 NOTE — Progress Notes (Signed)
Per pt, no allergies to soy or egg products.Pt not taking any weight loss meds or using  O2 at home.  Pt refused emmi video. 

## 2018-02-20 ENCOUNTER — Other Ambulatory Visit: Payer: Self-pay | Admitting: Physician Assistant

## 2018-02-22 ENCOUNTER — Ambulatory Visit (AMBULATORY_SURGERY_CENTER): Payer: Medicare Other | Admitting: Internal Medicine

## 2018-02-22 ENCOUNTER — Encounter: Payer: Self-pay | Admitting: Internal Medicine

## 2018-02-22 ENCOUNTER — Other Ambulatory Visit: Payer: Self-pay

## 2018-02-22 VITALS — BP 125/78 | HR 52 | Temp 96.8°F | Resp 19 | Ht 69.0 in | Wt 209.0 lb

## 2018-02-22 DIAGNOSIS — D122 Benign neoplasm of ascending colon: Secondary | ICD-10-CM

## 2018-02-22 DIAGNOSIS — D123 Benign neoplasm of transverse colon: Secondary | ICD-10-CM | POA: Diagnosis not present

## 2018-02-22 DIAGNOSIS — D120 Benign neoplasm of cecum: Secondary | ICD-10-CM

## 2018-02-22 DIAGNOSIS — D124 Benign neoplasm of descending colon: Secondary | ICD-10-CM | POA: Diagnosis not present

## 2018-02-22 DIAGNOSIS — Z86010 Personal history of colonic polyps: Secondary | ICD-10-CM | POA: Diagnosis present

## 2018-02-22 MED ORDER — SODIUM CHLORIDE 0.9 % IV SOLN: 500.0000 mL | Freq: Once | INTRAVENOUS | Status: DC

## 2018-02-22 NOTE — Progress Notes (Signed)
Pt's states no medical or surgical changes since previsit or office visit. 

## 2018-02-22 NOTE — Op Note (Signed)
[facility] Patient Name: [name] Colon Procedure Date: 02/22/2018 9:16 AM MRN: [id number] Endoscopist: Docia Chuck. Henrene Pastor , MD Age: 77 Referring MD:  Date of Birth: [birth year] Gender: Male Account #: [id number] Procedure:                Colonoscopy, with cold snare polypectomy x 8 Indications:              High risk colon cancer surveillance: Personal                            history of multiple (3 or more) adenomas. Multiple                            adenomas on multiple examinations. Prior exams                            2004, 2006, 2009, 2014, 2015 Medicines:                Monitored Anesthesia Care Procedure:                Pre-Anesthesia Assessment:                           - Prior to the procedure, a History and Physical                            was performed, and patient medications and                            allergies were reviewed. The patient's tolerance of                            previous anesthesia was also reviewed. The risks                            and benefits of the procedure and the sedation                            options and risks were discussed with the patient.                            All questions were answered, and informed consent                            was obtained. Prior Anticoagulants: The patient has                            taken no previous anticoagulant or antiplatelet                            agents. ASA Grade Assessment: II - A patient with                            mild systemic disease. After reviewing the risks  and benefits, the patient was deemed in                            satisfactory condition to undergo the procedure.                           After obtaining informed consent, the colonoscope                            was passed under direct vision. Throughout the                            procedure, the patient's blood pressure, pulse, and                            oxygen  saturations were monitored continuously. The                            Colonoscope was introduced through the anus and                            advanced to the the cecum, identified by                            appendiceal orifice and ileocecal valve. The                            ileocecal valve, appendiceal orifice, and rectum                            were photographed. The quality of the bowel                            preparation was good. The colonoscopy was performed                            without difficulty. The patient tolerated the                            procedure well. The bowel preparation used was                            SUPREP. Scope In: 9:33:20 AM Scope Out: 9:57:00 AM Scope Withdrawal Time: 0 hours 20 minutes 7 seconds  Total Procedure Duration: 0 hours 23 minutes 40 seconds  Findings:                 Eight polyps were found in the descending colon,                            transverse colon, ascending colon and cecum. The                            polyps were 1 to 4 mm in size. These polyps were  removed with a cold snare. Resection and retrieval                            were complete.                           Multiple diverticula were found in the left colon.                           There was mild radiation proctitis. The exam was                            otherwise without abnormality on direct and                            retroflexion views. Complications:            No immediate complications. Estimated blood loss:                            None. Estimated Blood Loss:     Estimated blood loss: none. Impression:               - Eight 1 to 4 mm polyps in the descending colon,                            in the transverse colon, in the ascending colon and                            in the cecum, removed with a cold snare. Resected                            and retrieved.                           - Diverticulosis  in the left colon.                           - radiation proctitis                           - The examination was otherwise normal on direct                            and retroflexion views. Recommendation:           - Repeat colonoscopy in 3 years for surveillance,                            if medically fit and willing.                           - Patient has a contact number available for                            emergencies. The signs and symptoms of potential  delayed complications were discussed with the                            patient. Return to normal activities tomorrow.                            Written discharge instructions were provided to the                            patient.                           - Resume previous diet.                           - Continue present medications.                           - Await pathology results. Docia Chuck. Henrene Pastor, MD 02/22/2018 10:05:47 AM This report has been signed electronically.

## 2018-02-22 NOTE — Patient Instructions (Signed)
YOU HAD AN ENDOSCOPIC PROCEDURE TODAY AT [facility]:   Refer to the procedure report that was given to you for any specific questions about what was found during the examination.  If the procedure report does not answer your questions, please call your gastroenterologist to clarify.  If you requested that your care partner not be given the details of your procedure findings, then the procedure report has been included in a sealed envelope for you to review at your convenience later.  YOU SHOULD EXPECT: Some feelings of bloating in the abdomen. Passage of more gas than usual.  Walking can help get rid of the air that was put into your GI tract during the procedure and reduce the bloating. If you had a lower endoscopy (such as a colonoscopy or flexible sigmoidoscopy) you may notice spotting of blood in your stool or on the toilet paper. If you underwent a bowel prep for your procedure, you may not have a normal bowel movement for a few days.  Please Note:  You might notice some irritation and congestion in your nose or some drainage.  This is from the oxygen used during your procedure.  There is no need for concern and it should clear up in a day or so.  SYMPTOMS TO REPORT IMMEDIATELY:   Following lower endoscopy (colonoscopy or flexible sigmoidoscopy):  Excessive amounts of blood in the stool  Significant tenderness or worsening of abdominal pains  Swelling of the abdomen that is new, acute  Fever of 100F or higher  For urgent or emergent issues, a gastroenterologist can be reached at any hour by calling [phone].   DIET:  We do recommend a small meal at first, but then you may proceed to your regular diet.  Drink plenty of fluids but you should avoid alcoholic beverages for 24 hours.  ACTIVITY:  You should plan to take it easy for the rest of today and you should NOT DRIVE or use heavy machinery until tomorrow (because of the sedation medicines used during the test).     FOLLOW UP: Our staff will call the number listed on your records the next business day following your procedure to check on you and address any questions or concerns that you may have regarding the information given to you following your procedure. If we do not reach you, we will leave a message.  However, if you are feeling well and you are not experiencing any problems, there is no need to return our call.  We will assume that you have returned to your regular daily activities without incident.  If any biopsies were taken you will be contacted by phone or by letter within the next 1-3 weeks.  Please call us at [phone] if you have not heard about the biopsies in 3 weeks.   SIGNATURES/CONFIDENTIALITY: You and/or your care partner have signed paperwork which will be entered into your electronic medical record.  These signatures attest to the fact that that the information above on your After Visit Summary has been reviewed and is understood.  Full responsibility of the confidentiality of this discharge information lies with you and/or your care-partner.  Await pathology  Please continue your normal medications  Please read over handouts about polyps, diverticulosis and high fiber diets

## 2018-02-22 NOTE — Progress Notes (Signed)
Called to room to assist during endoscopic procedure.  Patient ID and intended procedure confirmed with present staff. Received instructions for my participation in the procedure from the performing physician.  

## 2018-02-22 NOTE — Progress Notes (Signed)
A and O x3. Report to RN. Tolerated MAC anesthesia well.

## 2018-02-23 ENCOUNTER — Telehealth: Payer: Self-pay

## 2018-02-23 NOTE — Telephone Encounter (Signed)
  Follow up Call-  Call back number 02/22/2018  Post procedure Call Back phone  # [phone]  Permission to leave phone message Yes  Some recent data might be hidden     Patient questions:  Do you have a fever, pain , or abdominal swelling? No. Pain Score  0 *  Have you tolerated food without any problems? Yes.    Have you been able to return to your normal activities? Yes.    Do you have any questions about your discharge instructions: Diet   No. Medications  No. Follow up visit  No.  Do you have questions or concerns about your Care? No.  Actions: * If pain score is 4 or above: No action needed, pain <4.  No problems per pt. maw

## 2018-02-27 ENCOUNTER — Encounter: Payer: Self-pay | Admitting: Internal Medicine

## 2018-03-18 ENCOUNTER — Other Ambulatory Visit: Payer: Self-pay | Admitting: Adult Health

## 2018-03-23 ENCOUNTER — Other Ambulatory Visit: Payer: Self-pay | Admitting: Adult Health

## 2018-03-29 DIAGNOSIS — N1831 Chronic kidney disease, stage 3a: Secondary | ICD-10-CM | POA: Insufficient documentation

## 2018-03-29 DIAGNOSIS — E1122 Type 2 diabetes mellitus with diabetic chronic kidney disease: Secondary | ICD-10-CM | POA: Insufficient documentation

## 2018-03-29 DIAGNOSIS — N183 Chronic kidney disease, stage 3 unspecified: Secondary | ICD-10-CM | POA: Insufficient documentation

## 2018-03-29 DIAGNOSIS — N182 Chronic kidney disease, stage 2 (mild): Secondary | ICD-10-CM

## 2018-03-29 NOTE — Progress Notes (Signed)
FOLLOW UP  Assessment and Plan:   Hypertension Well controlled with current medications  Monitor blood pressure at home; patient to call if consistently greater than 130/80 Continue DASH diet.   Reminder to go to the ER if any CP, SOB, nausea, dizziness, severe HA, changes vision/speech, left arm numbness and tingling and jaw pain.  Cholesterol Currently borderline above goal; diet discussed, continue statin Continue low cholesterol diet and exercise.  Check lipid panel.   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  Perform daily foot/skin check, notify office of any concerning changes.  Check A1C  Obesity with co morbidities Long discussion about weight loss, diet, and exercise Recommended diet heavy in fruits and veggies and low in animal meats, cheeses, and dairy products, appropriate calorie intake Discussed ideal weight for height  Will follow up in 3 months  Vitamin D Def At goal at last visit; continue supplementation to maintain goal of 70-100 Defer Vit D level  Gout Continue allopurinol Diet discussed Check uric acid as needed  Cerumen impaction right - stop using Qtips, irrigation used in the office without complications, use OTC drops/oil at home to prevent reoccurence   Continue diet and meds as discussed. Further disposition pending results of labs. Discussed med's effects and SE's.   Over 30 minutes of exam, counseling, chart review, and critical decision making was performed.   Future Appointments  Date Time Provider [place]  06/30/2018  9:30 AM Unk Pinto, MD [facility] None  01/29/2019 10:00 AM Unk Pinto, MD [facility] None    ----------------------------------------------------------------------------------------------------------------------  HPI 77 y.o. male  presents for 3 month follow up on hypertension, cholesterol, diet-controlled diabetes, obesity, gout and  vitamin D deficiency. Gout controlled on Allopurinol. Other problems include PTSD followed at the [facility]/[place].  BMI is Body mass index is 30.72 kg/m., he has been working on diet and exercise. Wt Readings from Last 3 Encounters:  03/30/18 208 lb (94.3 kg)  02/22/18 209 lb (94.8 kg)  02/08/18 209 lb (94.8 kg)   His blood pressure has been controlled at home, today their BP is BP: 126/74  He does workout. He denies chest pain, shortness of breath, dizziness.   He is on cholesterol medication (simvastatin 40 mg daily) and denies myalgias. His cholesterol is not at goal. The cholesterol last visit was:   Lab Results  Component Value Date   CHOL 167 12/28/2017   HDL 46 12/28/2017   LDLCALC 105 (H) 12/28/2017   TRIG 75 12/28/2017   CHOLHDL 3.6 12/28/2017    He has been working on diet and exercise for diet controlled diabetes, and denies foot ulcerations, increased appetite, nausea, paresthesia of the feet, polydipsia, polyuria, visual disturbances, vomiting and weight loss. Last A1C in the office was:  Lab Results  Component Value Date   HGBA1C 6.2 (H) 12/28/2017   Patient is on Vitamin D supplement and at goal at recent check:    Lab Results  Component Value Date   VD25OH 85 12/28/2017     Patient is on allopurinol for gout and does not report a recent flare.  Lab Results  Component Value Date   LABURIC 5.3 12/28/2017      Current Medications:  Current Outpatient Medications on File Prior to Visit  Medication Sig  . albuterol (VENTOLIN HFA) 108 (90 Base) MCG/ACT inhaler Inhale 2 puffs into the lungs every 4 (four) hours as needed for wheezing or shortness of breath.  . allopurinol (ZYLOPRIM) 300 MG  tablet Take 300 mg by mouth daily.  Marland Kitchen amLODipine (NORVASC) 5 MG tablet Take 0.5 tablets (2.5 mg total) by mouth daily.  Marland Kitchen aspirin 81 MG tablet Take 81 mg by mouth daily.  Marland Kitchen atenolol (TENORMIN) 100 MG tablet TAKE 1 TABLET BY MOUTH EVERY DAY  . escitalopram (LEXAPRO)  20 MG tablet Take 20 mg by mouth daily.  . furosemide (LASIX) 40 MG tablet Take 1 tablet (40 mg total) by mouth daily.  Marland Kitchen losartan-hydrochlorothiazide (HYZAAR) 100-25 MG tablet Take 1 tablet by mouth daily.  . nitroGLYCERIN (NITROSTAT) 0.4 MG SL tablet Place 0.4 mg under the tongue every 5 (five) minutes as needed.    . potassium chloride SA (K-DUR,KLOR-CON) 20 MEQ tablet TAKE 1 TABLET BY MOUTH TWICE DAILY  . pyridOXINE (VITAMIN B-6) 50 MG tablet Take 50 mg by mouth daily.    . risperiDONE (RISPERDAL) 2 MG tablet Take 1 mg by mouth at bedtime.   . simvastatin (ZOCOR) 80 MG tablet Take 40 mg by mouth at bedtime.    . Tamsulosin HCl (FLOMAX) 0.4 MG CAPS Take 0.4 mg by mouth daily.    Marland Kitchen zolpidem (AMBIEN) 10 MG tablet Take 1 tablet (10 mg total) by mouth at bedtime. (Patient taking differently: Take 10 mg by mouth at bedtime as needed. )   Current Facility-Administered Medications on File Prior to Visit  Medication  . 0.9 %  sodium chloride infusion     Allergies: No Known Allergies   Medical History:  Past Medical History:  Diagnosis Date  . 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 Arthritis   . Axillary abscess    left  . CAD (coronary artery disease)   . Diabetes mellitus without complication (HCC)    diet controlled  . Diverticulitis, colon   . History of colonic polyps   . HTN (hypertension)   . Hyperlipidemia   . Radiation proctitis    with chronic bleeding   Family history- Reviewed and unchanged Social history- Reviewed and unchanged   Review of Systems:  Review of Systems  Constitutional: Negative for malaise/fatigue and weight loss.  HENT: Negative for hearing loss and tinnitus.   Eyes: Negative for blurred vision and double vision.  Respiratory: Negative for cough, shortness of breath and wheezing.   Cardiovascular: Negative for chest pain, palpitations, orthopnea, claudication and leg swelling.  Gastrointestinal: Negative for abdominal  pain, blood in stool, constipation, diarrhea, heartburn, melena, nausea and vomiting.  Genitourinary: Negative.   Musculoskeletal: Negative for joint pain and myalgias.  Skin: Negative for rash.  Neurological: Negative for dizziness, tingling, sensory change, weakness and headaches.  Endo/Heme/Allergies: Negative for polydipsia.  Psychiatric/Behavioral: Negative.   All other systems reviewed and are negative.    Physical Exam: BP 126/74   Pulse (!) 51   Temp (!) 97.3 F (36.3 C)   Ht 5\' 9"  (1.753 m)   Wt 208 lb (94.3 kg)   SpO2 97%   BMI 30.72 kg/m  Wt Readings from Last 3 Encounters:  03/30/18 208 lb (94.3 kg)  02/22/18 209 lb (94.8 kg)  02/08/18 209 lb (94.8 kg)   General Appearance: Well nourished, in no apparent distress. Eyes: PERRLA, EOMs, conjunctiva no swelling or erythema Sinuses: No Frontal/maxillary tenderness ENT/Mouth: left Ext aud canals clear, right obstructed by soft wax, L TMs without erythema, bulging. No erythema, swelling, or exudate on post pharynx.  Tonsils not swollen or erythematous. Hearing normal.  Neck: Supple, thyroid normal.  Respiratory: Respiratory effort normal,  BS equal bilaterally without rales, rhonchi, wheezing or stridor.  Cardio: RRR with no MRGs. Brisk peripheral pulses without edema.  Abdomen: Soft, + BS.  Non tender, no guarding, rebound, hernias, masses. Lymphatics: Non tender without lymphadenopathy.  Musculoskeletal: Full ROM, 5/5 strength, Normal gait Skin: Warm, dry without rashes, lesions, ecchymosis.  Neuro: Cranial nerves intact. No cerebellar symptoms.  Psych: Awake and oriented X 3, normal affect, Insight and Judgment appropriate.    Izora Ribas, NP 9:22 AM [facility]

## 2018-03-30 ENCOUNTER — Ambulatory Visit (INDEPENDENT_AMBULATORY_CARE_PROVIDER_SITE_OTHER): Payer: Medicare Other | Admitting: Adult Health

## 2018-03-30 ENCOUNTER — Encounter: Payer: Self-pay | Admitting: Adult Health

## 2018-03-30 VITALS — BP 126/74 | HR 51 | Temp 97.3°F | Ht 69.0 in | Wt 208.0 lb

## 2018-03-30 DIAGNOSIS — E119 Type 2 diabetes mellitus without complications: Secondary | ICD-10-CM

## 2018-03-30 DIAGNOSIS — M1000 Idiopathic gout, unspecified site: Secondary | ICD-10-CM

## 2018-03-30 DIAGNOSIS — I2510 Atherosclerotic heart disease of native coronary artery without angina pectoris: Secondary | ICD-10-CM

## 2018-03-30 DIAGNOSIS — Z79899 Other long term (current) drug therapy: Secondary | ICD-10-CM

## 2018-03-30 DIAGNOSIS — E559 Vitamin D deficiency, unspecified: Secondary | ICD-10-CM | POA: Diagnosis not present

## 2018-03-30 DIAGNOSIS — N182 Chronic kidney disease, stage 2 (mild): Secondary | ICD-10-CM

## 2018-03-30 DIAGNOSIS — I10 Essential (primary) hypertension: Secondary | ICD-10-CM

## 2018-03-30 DIAGNOSIS — E782 Mixed hyperlipidemia: Secondary | ICD-10-CM

## 2018-03-30 DIAGNOSIS — H6121 Impacted cerumen, right ear: Secondary | ICD-10-CM

## 2018-03-30 DIAGNOSIS — E1122 Type 2 diabetes mellitus with diabetic chronic kidney disease: Secondary | ICD-10-CM

## 2018-03-30 DIAGNOSIS — H9201 Otalgia, right ear: Secondary | ICD-10-CM

## 2018-03-30 DIAGNOSIS — Z6830 Body mass index (BMI) 30.0-30.9, adult: Secondary | ICD-10-CM

## 2018-03-30 MED ORDER — POTASSIUM CHLORIDE CRYS ER 20 MEQ PO TBCR: 20.0000 meq | EXTENDED_RELEASE_TABLET | Freq: Two times a day (BID) | ORAL | 3 refills | Status: DC

## 2018-03-30 MED ORDER — LOSARTAN POTASSIUM-HCTZ 100-25 MG PO TABS: 1.0000 | ORAL_TABLET | Freq: Every day | ORAL | 3 refills | Status: DC

## 2018-03-30 MED ORDER — ZOLPIDEM TARTRATE 10 MG PO TABS: 10.0000 mg | ORAL_TABLET | Freq: Every evening | ORAL | 5 refills | Status: DC | PRN

## 2018-03-30 NOTE — Patient Instructions (Signed)
Use a dropper or use a cap to put peroxide, olive oil,mineral oil or canola oil in the effected ear- 2-3 times a week. Let it soak for 20-30 min then you can take a shower or use a baby bulb with warm water to wash out the ear wax.  Do not use Qtips    Earwax Buildup, Adult The ears produce a substance called earwax that helps keep bacteria out of the ear and protects the skin in the ear canal. Occasionally, earwax can build up in the ear and cause discomfort or hearing loss. What increases the risk? This condition is more likely to develop in people who:  Are male.  Are elderly.  Naturally produce more earwax.  Clean their ears often with cotton swabs.  Use earplugs often.  Use in-ear headphones often.  Wear hearing aids.  Have narrow ear canals.  Have earwax that is overly thick or sticky.  Have eczema.  Are dehydrated.  Have excess hair in the ear canal.  What are the signs or symptoms? Symptoms of this condition include:  Reduced or muffled hearing.  A feeling of fullness in the ear or feeling that the ear is plugged.  Fluid coming from the ear.  Ear pain.  Ear itch.  Ringing in the ear.  Coughing.  An obvious piece of earwax that can be seen inside the ear canal.  How is this diagnosed? This condition may be diagnosed based on:  Your symptoms.  Your medical history.  An ear exam. During the exam, your health care provider will look into your ear with an instrument called an otoscope.  You may have tests, including a hearing test. How is this treated? This condition may be treated by:  Using ear drops to soften the earwax.  Having the earwax removed by a health care provider. The health care provider may: ? Flush the ear with water. ? Use an instrument that has a loop on the end (curette). ? Use a suction device.  Surgery to remove the wax buildup. This may be done in severe cases.  Follow these instructions at home:  Take  over-the-counter and prescription medicines only as told by your health care provider.  Do not put any objects, including cotton swabs, into your ear. You can clean the opening of your ear canal with a washcloth or facial tissue.  Follow instructions from your health care provider about cleaning your ears. Do not over-clean your ears.  Drink enough fluid to keep your urine clear or pale yellow. This will help to thin the earwax.  Keep all follow-up visits as told by your health care provider. If earwax builds up in your ears often or if you use hearing aids, consider seeing your health care provider for routine, preventive ear cleanings. Ask your health care provider how often you should schedule your cleanings.  If you have hearing aids, clean them according to instructions from the manufacturer and your health care provider. Contact a health care provider if:  You have ear pain.  You develop a fever.  You have blood, pus, or other fluid coming from your ear.  You have hearing loss.  You have ringing in your ears that does not go away.  Your symptoms do not improve with treatment.  You feel like the room is spinning (vertigo). Summary  Earwax can build up in the ear and cause discomfort or hearing loss.  The most common symptoms of this condition include reduced or muffled hearing and   a feeling of fullness in the ear or feeling that the ear is plugged.  This condition may be diagnosed based on your symptoms, your medical history, and an ear exam.  This condition may be treated by using ear drops to soften the earwax or by having the earwax removed by a health care provider.  Do not put any objects, including cotton swabs, into your ear. You can clean the opening of your ear canal with a washcloth or facial tissue. This information is not intended to replace advice given to you by your health care provider. Make sure you discuss any questions you have with your health care  provider. Document Released: 12/16/2004 Document Revised: 01/19/2017 Document Reviewed: 01/19/2017 Elsevier Interactive Patient Education  2018 [employer]  

## 2018-03-31 LAB — COMPLETE METABOLIC PANEL WITH GFR
AG Ratio: 1.7 (calc) (ref 1.0–2.5)
ALT: 21 U/L (ref 9–46)
AST: 24 U/L (ref 10–35)
Albumin: 4.5 g/dL (ref 3.6–5.1)
Alkaline phosphatase (APISO): 47 U/L (ref 40–115)
BUN: 12 mg/dL (ref 7–25)
CALCIUM: 10.3 mg/dL (ref 8.6–10.3)
CO2: 30 mmol/L (ref 20–32)
CREATININE: 1.17 mg/dL (ref 0.70–1.18)
Chloride: 103 mmol/L (ref 98–110)
GFR, EST NON AFRICAN AMERICAN: 60 mL/min/{1.73_m2} (ref >=60)
GFR, Est African American: 69 mL/min/{1.73_m2} (ref >=60)
Globulin: 2.7 g/dL (calc) (ref 1.9–3.7)
Glucose, Bld: 155 mg/dL — ABNORMAL HIGH (ref 65–99)
POTASSIUM: 3.5 mmol/L (ref 3.5–5.3)
Sodium: 140 mmol/L (ref 135–146)
Total Bilirubin: 0.7 mg/dL (ref 0.2–1.2)
Total Protein: 7.2 g/dL (ref 6.1–8.1)

## 2018-03-31 LAB — CBC WITH DIFFERENTIAL/PLATELET
Basophils Absolute: 19 cells/uL (ref 0–200)
Basophils Relative: 0.4 %
EOS PCT: 3.1 %
Eosinophils Absolute: 149 cells/uL (ref 15–500)
HCT: 45.6 % (ref 38.5–50.0)
Hemoglobin: 14.8 g/dL (ref 13.2–17.1)
Lymphs Abs: 2045 cells/uL (ref 850–3900)
MCH: 25.5 pg — ABNORMAL LOW (ref 27.0–33.0)
MCHC: 32.5 g/dL (ref 32.0–36.0)
MCV: 78.5 fL — ABNORMAL LOW (ref 80.0–100.0)
MONOS PCT: 8.8 %
MPV: 11.3 fL (ref 7.5–12.5)
NEUTROS PCT: 45.1 %
Neutro Abs: 2165 cells/uL (ref 1500–7800)
PLATELETS: 180 10*3/uL (ref 140–400)
RBC: 5.81 10*6/uL — AB (ref 4.20–5.80)
RDW: 14.4 % (ref 11.0–15.0)
TOTAL LYMPHOCYTE: 42.6 %
WBC mixed population: 422 cells/uL (ref 200–950)
WBC: 4.8 10*3/uL (ref 3.8–10.8)

## 2018-03-31 LAB — HEMOGLOBIN A1C
EAG (MMOL/L): 7.7 (calc)
HEMOGLOBIN A1C: 6.5 %{Hb} — AB (ref <5.7)
MEAN PLASMA GLUCOSE: 140 (calc)

## 2018-03-31 LAB — LIPID PANEL
CHOLESTEROL: 160 mg/dL (ref <200)
HDL: 44 mg/dL (ref >40)
LDL Cholesterol (Calc): 96 mg/dL (calc)
NON-HDL CHOLESTEROL (CALC): 116 mg/dL (ref <130)
Total CHOL/HDL Ratio: 3.6 (calc) (ref <5.0)
Triglycerides: 105 mg/dL (ref <150)

## 2018-03-31 LAB — TSH: TSH: 2.78 mIU/L (ref 0.40–4.50)

## 2018-05-23 ENCOUNTER — Other Ambulatory Visit: Payer: Self-pay | Admitting: *Deleted

## 2018-05-23 MED ORDER — ALBUTEROL SULFATE HFA 108 (90 BASE) MCG/ACT IN AERS: 2.0000 | INHALATION_SPRAY | RESPIRATORY_TRACT | 1 refills | Status: DC | PRN

## 2018-06-30 ENCOUNTER — Encounter: Payer: Self-pay | Admitting: Internal Medicine

## 2018-06-30 ENCOUNTER — Ambulatory Visit (INDEPENDENT_AMBULATORY_CARE_PROVIDER_SITE_OTHER): Payer: Medicare Other | Admitting: Internal Medicine

## 2018-06-30 VITALS — BP 116/80 | HR 52 | Temp 97.9°F | Resp 16 | Ht 69.0 in | Wt 206.6 lb

## 2018-06-30 DIAGNOSIS — I2510 Atherosclerotic heart disease of native coronary artery without angina pectoris: Secondary | ICD-10-CM

## 2018-06-30 DIAGNOSIS — N182 Chronic kidney disease, stage 2 (mild): Secondary | ICD-10-CM

## 2018-06-30 DIAGNOSIS — E559 Vitamin D deficiency, unspecified: Secondary | ICD-10-CM | POA: Diagnosis not present

## 2018-06-30 DIAGNOSIS — I10 Essential (primary) hypertension: Secondary | ICD-10-CM | POA: Diagnosis not present

## 2018-06-30 DIAGNOSIS — M1000 Idiopathic gout, unspecified site: Secondary | ICD-10-CM

## 2018-06-30 DIAGNOSIS — E782 Mixed hyperlipidemia: Secondary | ICD-10-CM

## 2018-06-30 DIAGNOSIS — E1122 Type 2 diabetes mellitus with diabetic chronic kidney disease: Secondary | ICD-10-CM

## 2018-06-30 DIAGNOSIS — Z79899 Other long term (current) drug therapy: Secondary | ICD-10-CM

## 2018-06-30 NOTE — Progress Notes (Signed)
This very nice 77 y.o. MBM  presents for 6 month follow up with HTN, HLD, duiet D2_DM and Vitamin D Deficiency. Patients's Gout is quiescent on Allopurinol.  Post Brachytherapy (2002)  for Prostate Ca.      Patient is treated for HTN (1990)  & BP has been controlled at home. Today's BP is at goal -  116/80. Patient has had no complaints of any cardiac type chest pain, palpitations, dyspnea / orthopnea / PND, dizziness, claudication, or dependent edema.     Hyperlipidemia is controlled with diet & meds. Patient denies myalgias or other med SE's. Last Lipids were  Lab Results  Component Value Date   CHOL 140 06/30/2018   HDL 40 (L) 06/30/2018   LDLCALC 79 06/30/2018   TRIG 114 06/30/2018   CHOLHDL 3.5 06/30/2018      Also, the patient has history of T2_NIDDM (2003) attempting control with diet. and has had no symptoms of reactive hypoglycemia, diabetic polys, paresthesias or visual blurring.  Last A1c was not at goal: Lab Results  Component Value Date   HGBA1C 6.3 (H) 06/30/2018      Further, the patient also has history of Vitamin D Deficiency ("38"/2012 on Tx) and supplements vitamin D without any suspected side-effects. Last vitamin D was at goal: Lab Results  Component Value Date   VD25OH 83 06/30/2018   Current Outpatient Medications on File Prior to Visit  Medication Sig  . albuterol (VENTOLIN HFA) 108 (90 Base) MCG/ACT inhaler Inhale 2 puffs into the lungs every 4 (four) hours as needed for wheezing or shortness of breath.  . allopurinol (ZYLOPRIM) 300 MG tablet Take 300 mg by mouth daily.  Marland Kitchen amLODipine (NORVASC) 5 MG tablet Take 0.5 tablets (2.5 mg total) by mouth daily.  Marland Kitchen aspirin 81 MG tablet Take 81 mg by mouth daily.  Marland Kitchen atenolol (TENORMIN) 100 MG tablet TAKE 1 TABLET BY MOUTH EVERY DAY  . escitalopram (LEXAPRO) 20 MG tablet Take 20 mg by mouth daily.  . furosemide (LASIX) 40 MG tablet Take 1 tablet (40 mg total) by mouth daily.  Marland Kitchen losartan-hydrochlorothiazide (HYZAAR)  100-25 MG tablet Take 1 tablet by mouth daily.  . nitroGLYCERIN (NITROSTAT) 0.4 MG SL tablet Place 0.4 mg under the tongue every 5 (five) minutes as needed.    . potassium chloride SA (K-DUR,KLOR-CON) 20 MEQ tablet Take 1 tablet (20 mEq total) by mouth 2 (two) times daily.  Marland Kitchen pyridOXINE (VITAMIN B-6) 50 MG tablet Take 50 mg by mouth daily.    . risperiDONE (RISPERDAL) 2 MG tablet Take 1 mg by mouth at bedtime.   . simvastatin (ZOCOR) 80 MG tablet Take 40 mg by mouth at bedtime.    . Tamsulosin HCl (FLOMAX) 0.4 MG CAPS Take 0.4 mg by mouth daily.    Marland Kitchen zolpidem (AMBIEN) 10 MG tablet Take 1 tablet (10 mg total) by mouth at bedtime as needed.   No current facility-administered medications on file prior to visit.    No Known Allergies   PMHx:   Past Medical History:  Diagnosis Date  . 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 Arthritis   . Axillary abscess    left  . CAD (coronary artery disease)   . Diabetes mellitus without complication (HCC)    diet controlled  . Diverticulitis, colon   . History of colonic polyps   . HTN (hypertension)   . Hyperlipidemia   . Radiation proctitis    with  chronic bleeding   Immunization History  Administered Date(s) Administered  . Influenza-Unspecified 08/22/2014, 09/03/2015, 08/25/2016  . Pneumococcal Conjugate-13 09/03/2015  . Pneumococcal-Unspecified 08/25/2016  . Td 11/22/2006   Past Surgical History:  Procedure Laterality Date  . COLONOSCOPY    . COLONOSCOPY W/ POLYPECTOMY    . CORONARY ANGIOPLASTY WITH STENT PLACEMENT  04/2008  . MASS EXCISION Left 10/06/2017   Procedure: EXCISION LEFT AXILLA CYST;  Surgeon: Erroll Luna, MD;  Location: [place];  Service: General;  Laterality: Left;   FHx:    Reviewed / unchanged  SHx:    Reviewed / unchanged   Systems Review:  Constitutional: Denies fever, chills, wt changes, headaches, insomnia, fatigue, night sweats, change in appetite. Eyes: Denies  redness, blurred vision, diplopia, discharge, itchy, watery eyes.  ENT: Denies discharge, congestion, post nasal drip, epistaxis, sore throat, earache, hearing loss, dental pain, tinnitus, vertigo, sinus pain, snoring.  CV: Denies chest pain, palpitations, irregular heartbeat, syncope, dyspnea, diaphoresis, orthopnea, PND, claudication or edema. Respiratory: denies cough, dyspnea, DOE, pleurisy, hoarseness, laryngitis, wheezing.  Gastrointestinal: Denies dysphagia, odynophagia, heartburn, reflux, water brash, abdominal pain or cramps, nausea, vomiting, bloating, diarrhea, constipation, hematemesis, melena, hematochezia  or hemorrhoids. Genitourinary: Denies dysuria, frequency, urgency, nocturia, hesitancy, discharge, hematuria or flank pain. Musculoskeletal: Denies arthralgias, myalgias, stiffness, jt. swelling, pain, limping or strain/sprain.  Skin: Denies pruritus, rash, hives, warts, acne, eczema or change in skin lesion(s). Neuro: No weakness, tremor, incoordination, spasms, paresthesia or pain. Psychiatric: Denies confusion, memory loss or sensory loss. Endo: Denies change in weight, skin or hair change.  Heme/Lymph: No excessive bleeding, bruising or enlarged lymph nodes.  Physical Exam  BP 116/80   Pulse (!) 52   Temp 97.9 F (36.6 C)   Resp 16   Ht 5\' 9"  (1.753 m)   Wt 206 lb 9.6 oz (93.7 kg)   BMI 30.51 kg/m   Appears  well nourished, well groomed  and in no distress.  Eyes: PERRLA, EOMs, conjunctiva no swelling or erythema. Sinuses: No frontal/maxillary tenderness ENT/Mouth: EAC's clear, TM's nl w/o erythema, bulging. Nares clear w/o erythema, swelling, exudates. Oropharynx clear without erythema or exudates. Oral hygiene is good. Tongue normal, non obstructing. Hearing intact.  Neck: Supple. Thyroid not palpable. Car 2+/2+ without bruits, nodes or JVD. Chest: Respirations nl with BS clear & equal w/o rales, rhonchi, wheezing or stridor.  Cor: Heart sounds normal w/ regular  rate and rhythm without sig. murmurs, gallops, clicks or rubs. Peripheral pulses normal and equal  without edema.  Abdomen: Soft & bowel sounds normal. Non-tender w/o guarding, rebound, hernias, masses or organomegaly.  Lymphatics: Unremarkable.  Musculoskeletal: Full ROM all peripheral extremities, joint stability, 5/5 strength and normal gait.  Skin: Warm, dry without exposed rashes, lesions or ecchymosis apparent.  Neuro: Cranial nerves intact, reflexes equal bilaterally. Sensory-motor testing grossly intact. Tendon reflexes grossly intact.  Pysch: Alert & oriented x 3.  Insight and judgement nl & appropriate. No ideations.  Assessment and Plan:  1. Essential hypertension  - Continue medication, monitor blood pressure at home.  - Continue DASH diet.  Reminder to go to the ER if any CP,  SOB, nausea, dizziness, severe HA, changes vision/speech.  - CBC with Differential/Platelet - COMPLETE METABOLIC PANEL WITH GFR - Magnesium - TSH  2. Hyperlipidemia, mixed  - Continue diet/meds, exercise,& lifestyle modifications.  - Continue monitor periodic cholesterol/liver & renal functions   - Lipid panel - TSH  3. Type 2 diabetes mellitus with stage 2 chronic kidney disease,  without long-term current use of insulin (HCC)  - Continue diet, exercise, lifestyle modifications.  - Monitor appropriate labs.  - Hemoglobin A1c - Insulin, random  4. Vitamin D deficiency  - Continue supplementation.  - VITAMIN D 25 Hydroxyl  5. Idiopathic gout  - Uric acid  6. Medication management  - CBC with Differential/Platelet - COMPLETE METABOLIC PANEL WITH GFR - Magnesium - Lipid panel - TSH - Hemoglobin A1c - Insulin, random - VITAMIN D 25 Hydroxyl - Uric acid      Discussed  regular exercise, BP monitoring, weight control to achieve/maintain BMI less than 25 and discussed med and SE's. Recommended labs to assess and monitor clinical status with further disposition pending results of  labs. Over 30 minutes of exam, counseling, chart review was performed.

## 2018-06-30 NOTE — Patient Instructions (Signed)

## 2018-07-03 LAB — CBC WITH DIFFERENTIAL/PLATELET
BASOS PCT: 0.3 %
Basophils Absolute: 12 cells/uL (ref 0–200)
Eosinophils Absolute: 112 cells/uL (ref 15–500)
Eosinophils Relative: 2.8 %
HCT: 46.4 % (ref 38.5–50.0)
Hemoglobin: 14.9 g/dL (ref 13.2–17.1)
Lymphs Abs: 1764 cells/uL (ref 850–3900)
MCH: 25.7 pg — AB (ref 27.0–33.0)
MCHC: 32.1 g/dL (ref 32.0–36.0)
MCV: 80 fL (ref 80.0–100.0)
MONOS PCT: 8.8 %
MPV: 11.4 fL (ref 7.5–12.5)
NEUTROS PCT: 44 %
Neutro Abs: 1760 cells/uL (ref 1500–7800)
PLATELETS: 184 10*3/uL (ref 140–400)
RBC: 5.8 10*6/uL (ref 4.20–5.80)
RDW: 14.6 % (ref 11.0–15.0)
TOTAL LYMPHOCYTE: 44.1 %
WBC mixed population: 352 cells/uL (ref 200–950)
WBC: 4 10*3/uL (ref 3.8–10.8)

## 2018-07-03 LAB — LIPID PANEL
CHOLESTEROL: 140 mg/dL (ref <200)
HDL: 40 mg/dL — AB (ref >40)
LDL Cholesterol (Calc): 79 mg/dL (calc)
Non-HDL Cholesterol (Calc): 100 mg/dL (calc) (ref <130)
Total CHOL/HDL Ratio: 3.5 (calc) (ref <5.0)
Triglycerides: 114 mg/dL (ref <150)

## 2018-07-03 LAB — COMPLETE METABOLIC PANEL WITH GFR
AG RATIO: 1.7 (calc) (ref 1.0–2.5)
ALKALINE PHOSPHATASE (APISO): 52 U/L (ref 40–115)
ALT: 18 U/L (ref 9–46)
AST: 21 U/L (ref 10–35)
Albumin: 4.5 g/dL (ref 3.6–5.1)
BUN/Creatinine Ratio: 14 (calc) (ref 6–22)
BUN: 17 mg/dL (ref 7–25)
CALCIUM: 10 mg/dL (ref 8.6–10.3)
CHLORIDE: 103 mmol/L (ref 98–110)
CO2: 28 mmol/L (ref 20–32)
Creat: 1.23 mg/dL — ABNORMAL HIGH (ref 0.70–1.18)
GFR, Est African American: 65 mL/min/{1.73_m2} (ref >=60)
GFR, Est Non African American: 56 mL/min/{1.73_m2} — ABNORMAL LOW (ref >=60)
GLOBULIN: 2.6 g/dL (ref 1.9–3.7)
Glucose, Bld: 201 mg/dL — ABNORMAL HIGH (ref 65–99)
POTASSIUM: 3.7 mmol/L (ref 3.5–5.3)
Sodium: 141 mmol/L (ref 135–146)
Total Bilirubin: 0.6 mg/dL (ref 0.2–1.2)
Total Protein: 7.1 g/dL (ref 6.1–8.1)

## 2018-07-03 LAB — HEMOGLOBIN A1C
EAG (MMOL/L): 7.4 (calc)
HEMOGLOBIN A1C: 6.3 %{Hb} — AB (ref <5.7)
MEAN PLASMA GLUCOSE: 134 (calc)

## 2018-07-03 LAB — URIC ACID: URIC ACID, SERUM: 4.2 mg/dL (ref 4.0–8.0)

## 2018-07-03 LAB — TSH: TSH: 2.2 mIU/L (ref 0.40–4.50)

## 2018-07-03 LAB — INSULIN, RANDOM: INSULIN: 146.3 u[IU]/mL — AB (ref 2.0–19.6)

## 2018-07-03 LAB — VITAMIN D 25 HYDROXY (VIT D DEFICIENCY, FRACTURES): VIT D 25 HYDROXY: 83 ng/mL (ref 30–100)

## 2018-07-03 LAB — MAGNESIUM: MAGNESIUM: 2 mg/dL (ref 1.5–2.5)

## 2018-09-09 ENCOUNTER — Other Ambulatory Visit: Payer: Self-pay | Admitting: Internal Medicine

## 2018-10-09 DIAGNOSIS — G4700 Insomnia, unspecified: Secondary | ICD-10-CM | POA: Insufficient documentation

## 2018-10-09 NOTE — Progress Notes (Signed)
MEDICARE ANNUAL WELLNESS VISIT AND FOLLOW UP Assessment:   Encounter for Medicare annual wellness exam  Essential hypertension - continue medications, DASH diet, exercise and monitor at home. Call if greater than 130/80.  -     furosemide (LASIX) 40 MG tablet; Take 1 tablet (40 mg total) by mouth daily. -     CBC with Differential/Platelet -     CMP/GFR -     TSH -     losartan-hydrochlorothiazide (HYZAAR) 100-25 MG tablet; Take 1 tablet by mouth daily.  Atherosclerosis of native coronary artery without angina pectoris, unspecified whether native or transplanted heart Control blood pressure, cholesterol, glucose, increase exercise.  Follow up Dr. Percival Spanish  Hyperlipidemia -continue medications, check lipids, decrease fatty foods, increase activity.  -     Lipid panel  Diet-controlled diabetes mellitus ([place]) Discussed general issues about diabetes pathophysiology and management., Educational material distributed., Suggested low cholesterol diet., Encouraged aerobic exercise., Discussed foot care., Reminded to get yearly retinal exam. -     Hemoglobin A1c  Benign neoplasm of colon, unspecified part of colon Continue follow up  ADENOCARCINOMA, PROSTATE Continue follow up  [place] follow up  Diverticulosis of colon Continue follow up  Vitamin D deficiency Continue supplement  PTSD (post-traumatic stress disorder) Continue [facility] follow up  BMI 30 - long discussion about weight loss, diet, and exercise -recommended diet heavy in fruits and veggies and low in animal meats, cheeses, and dairy products  Idiopathic gout, unspecified chronicity, unspecified site Gout- recheck Uric acid as needed, Diet discussed, continue medications.  Insomnia - good sleep hygiene discussed, increase day time activity, try melatonin or benadryl -     zolpidem (AMBIEN) 10 MG tablet; Take 1 tablet (10 mg total) by mouth at bedtime.  Arthritis of both knees Managed by [facility];  encouraged to follow up, significantly limiting activities/exercise Wear brace as recommended  Over 30 minutes of exam, counseling, chart review, and critical decision making was performed  Future Appointments  Date Time Provider [place]  01/29/2019 10:00 AM Unk Pinto, MD [facility] None    Plan:   During the course of the visit the patient was educated and counseled about appropriate screening and preventive services including:    Pneumococcal vaccine   Influenza vaccine  Prevnar 13  Td vaccine  Screening electrocardiogram  Colorectal cancer screening  Diabetes screening  Glaucoma screening  Nutrition counseling    Subjective:  [name] Colon is a 77 y.o. male who presents for Medicare Annual Wellness Visit and 3 month follow up for HTN, hyperlipidemia, diet controlled T2DM, and vitamin D Def.   Has bilateral OA knees. Has mobic that helps some, following with the [place]. Has brace/cane but not using.  He is s/p stent in 2009, had normal stress test 02/2016 with Dr. Percival Spanish. 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    He has insomnia treated by Lorrin Mais and reports he sleeps well with this without notable side effects.   BMI is Body mass index is 31.31 kg/m., he has not been working on diet and exercise, knees limit exercise, needs to cut out potatoes.  Wt Readings from Last 3 Encounters:  10/10/18 212 lb (96.2 kg)  06/30/18 206 lb 9.6 oz (93.7 kg)  03/30/18 208 lb (94.3 kg)   His blood pressure has been controlled at home, today their BP is  BP: 120/78  He does not workout. He denies chest pain, shortness of breath, dizziness.   He is on cholesterol medication (simvastatin 20 mg daily) and denies myalgias. His cholesterol is at goal. The cholesterol last visit was:   Lab Results   Component Value Date   CHOL 140 06/30/2018   HDL 40 (L) 06/30/2018   LDLCALC 79 06/30/2018   TRIG 114 06/30/2018   CHOLHDL 3.5 06/30/2018   He has been working on diet and exercise for Diabetes without complications, has been in and out of DM range x 2003, currently with diet and exercise he is in the preDM range, he is on bASA, he is on ACE/ARB, and denies paresthesia of the feet, polydipsia, polyuria and visual disturbances. Last A1C was:  Lab Results  Component Value Date   HGBA1C 6.3 (H) 06/30/2018   Patient is on Vitamin D supplement.   Lab Results  Component Value Date   VD25OH 83 06/30/2018     Lab Results  Component Value Date   GFRAA 65 06/30/2018   Patient is on allopurinol for gout and does not report a recent flare.  Lab Results  Component Value Date   LABURIC 4.2 06/30/2018    Medication Review: Current Outpatient Medications on File Prior to Visit  Medication Sig Dispense Refill  . albuterol (VENTOLIN HFA) 108 (90 Base) MCG/ACT inhaler Inhale 2 puffs into the lungs every 4 (four) hours as needed for wheezing or shortness of breath. 1 Inhaler 1  . allopurinol (ZYLOPRIM) 300 MG tablet Take 300 mg by mouth daily.    Marland Kitchen amLODipine (NORVASC) 5 MG tablet Take 0.5 tablets (2.5 mg total) by mouth daily. 45 tablet 3  . aspirin 81 MG tablet Take 81 mg by mouth daily.    Marland Kitchen atenolol (TENORMIN) 100 MG tablet TAKE 1 TABLET BY MOUTH EVERY DAY 90 tablet 1  . escitalopram (LEXAPRO) 20 MG tablet Take 20 mg by mouth daily.    . furosemide (LASIX) 40 MG tablet Take 1 tablet (40 mg total) by mouth daily. 30 tablet 3  . losartan-hydrochlorothiazide (HYZAAR) 100-25 MG tablet Take 1 tablet by mouth daily. 90 tablet 3  . nitroGLYCERIN (NITROSTAT) 0.4 MG SL tablet Place 0.4 mg under the tongue every 5 (five) minutes as needed.      . potassium chloride SA (K-DUR,KLOR-CON) 20 MEQ tablet Take 1 tablet (20 mEq total) by mouth 2 (two) times daily. 180 tablet 3  . pyridOXINE (VITAMIN B-6) 50  MG tablet Take 50 mg by mouth daily.      . risperiDONE (RISPERDAL) 2 MG tablet Take 1 mg by mouth at bedtime.     . simvastatin (ZOCOR) 80 MG tablet Take 40 mg by mouth at bedtime.      . Tamsulosin HCl (FLOMAX) 0.4 MG CAPS Take 0.4 mg by mouth daily.      Marland Kitchen zolpidem (AMBIEN) 10 MG tablet Take 1 tablet (10 mg total) by mouth at bedtime as needed. 30 tablet 5   No current facility-administered medications on file prior to visit.     Allergies: No Active Allergies  Current Problems (verified) has ADENOCARCINOMA, PROSTATE; COLONIC POLYPS; Hyperlipidemia associated with type 2 diabetes mellitus ([place]); Essential hypertension; Coronary atherosclerosis; RADIATION PROCTITIS; Diverticulosis of colon; Diet-controlled diabetes mellitus ([place]); Medication management; Vitamin D deficiency; PTSD (post-traumatic stress disorder); BMI 30.0-30.9,adult; Gout; CKD stage 2 due to type 2 diabetes mellitus ([place]); and Insomnia on their problem list.  Screening Tests Immunization History  Administered Date(s) Administered  .  Influenza, High Dose Seasonal PF 09/08/2018  . Influenza-Unspecified 08/22/2014, 09/03/2015, 08/25/2016  . Pneumococcal Conjugate-13 09/03/2015  . Pneumococcal-Unspecified 08/25/2016  . Td 11/22/2006   Preventative care: Last colonoscopy: 02/22/2018 due 2022 CXR 2018 Ct head 2013  Prior vaccinations: TD or Tdap: 2008 declines Influenza: 2019 Pneumococcal: 2017 Prevnar13: 2016 Shingles/Zostavax: declines  Names of Other Physician/Practitioners you currently use: 1. [facility] here for primary care 2. [facility], last visit  June 2018- needs to schedule, reports requested 3. Dr. ?, unknown name, dentist, last visit 2019, q66m  Patient Care Team: Unk Pinto, MD as PCP - General (Internal Medicine) Minus Breeding, MD as Consulting Physician (Cardiology) Irene Shipper, MD as Consulting Physician (Gastroenterology) Kathie Rhodes, MD as Consulting Physician (Urology)  Surgical: He  has a past surgical history that includes Coronary angioplasty with stent (04/2008); Colonoscopy w/ polypectomy; Mass excision (Left, 10/06/2017); and Colonoscopy. Family His family history includes Heart disease in his father; Hypertension in his father; Hypotension in his mother; Kidney disease in his brother. Social history  He reports that he quit smoking about 25 years ago. He has never used smokeless tobacco. He reports that he does not drink alcohol or use drugs.  MEDICARE WELLNESS OBJECTIVES: Physical activity: Current Exercise Habits: The patient does not participate in regular exercise at present, Exercise limited by: orthopedic condition(s)(right knee pain) Cardiac risk factors: Cardiac Risk Factors include: advanced age (>71men, >88 women);dyslipidemia;hypertension;male gender;diabetes mellitus;sedentary lifestyle;obesity (BMI >30kg/m2);smoking/ tobacco exposure Depression/mood screen:   Depression screen [facility] 2/9 10/10/2018  Decreased Interest 0  Down, Depressed, Hopeless 0  PHQ - 2 Score 0    ADLs:  In your present state of health, do you have any difficulty performing the following activities: 10/10/2018 07/01/2018  Hearing? Y N  Comment has hearing aids but don't help, only problems with echo in room -  Vision? N N  Difficulty concentrating or making decisions? N N  Walking or climbing stairs? N N  Dressing or bathing? N N  Doing errands, shopping? N N  Some recent data might be hidden     Cognitive Testing  Alert? Yes  Normal Appearance?Yes  Oriented to person? Yes  Place? Yes   Time? Yes  Recall of three objects?  Yes  Can perform simple calculations? Yes  Displays appropriate judgment?Yes  Can read the correct time from a watch face?Yes  EOL planning: Does Patient Have a Medical Advance Directive?: No Would patient like information on creating a medical advance directive?: No - Patient declined(has  appointment with lawyer)   Objective:   Today's Vitals   10/10/18 0851  BP: 120/78  Pulse: 64  Temp: (!) 97.3 F (36.3 C)  SpO2: 97%  Weight: 212 lb (96.2 kg)  Height: 5\' 9"  (1.753 m)   Body mass index is 31.31 kg/m.  General appearance: alert, no distress, WD/WN, male HEENT: normocephalic, sclerae anicteric, TMs pearly, nares patent, no discharge or erythema, pharynx normal Oral cavity: MMM, no lesions Neck: supple, no lymphadenopathy, no thyromegaly, no masses Heart: RRR, normal S1, S2, Mild 2/6 systolic murmur without radiation Lungs: CTA bilaterally, + worse RLL but bilateral wheezes, but no rhonchi, or rales Abdomen: +bs, obese,  soft, non tender, non distended, no masses, no hepatomegaly, no splenomegaly Musculoskeletal: nontender, no swelling, no obvious deformity Extremities: 1+ edema, no cyanosis, no clubbing Pulses: 2+ symmetric, upper and lower extremities, normal cap refill Neurological: alert, oriented x 3, CN2-12 intact, strength normal upper extremities and lower  extremities, sensation normal throughout, DTRs 2+ throughout, no cerebellar signs, gait slow antalgic Psychiatric: normal affect, behavior normal, pleasant   Medicare Attestation I have personally reviewed: The patient's medical and social history Their use of alcohol, tobacco or illicit drugs Their current medications and supplements The patient's functional ability including ADLs,fall risks, home safety risks, cognitive, and hearing and visual impairment Diet and physical activities Evidence for depression or mood disorders  The patient's weight, height, BMI, and visual acuity have been recorded in the chart.  I have made referrals, counseling, and provided education to the patient based on review of the above and I have provided the patient with a written personalized care plan for preventive services.     Izora Ribas, NP   10/10/2018

## 2018-10-10 ENCOUNTER — Encounter: Payer: Self-pay | Admitting: Adult Health

## 2018-10-10 ENCOUNTER — Ambulatory Visit (INDEPENDENT_AMBULATORY_CARE_PROVIDER_SITE_OTHER): Payer: Medicare Other | Admitting: Adult Health

## 2018-10-10 VITALS — BP 120/78 | HR 64 | Temp 97.3°F | Ht 69.0 in | Wt 212.0 lb

## 2018-10-10 DIAGNOSIS — K5730 Diverticulosis of large intestine without perforation or abscess without bleeding: Secondary | ICD-10-CM | POA: Diagnosis not present

## 2018-10-10 DIAGNOSIS — K520 Gastroenteritis and colitis due to radiation: Secondary | ICD-10-CM

## 2018-10-10 DIAGNOSIS — E119 Type 2 diabetes mellitus without complications: Secondary | ICD-10-CM

## 2018-10-10 DIAGNOSIS — D126 Benign neoplasm of colon, unspecified: Secondary | ICD-10-CM

## 2018-10-10 DIAGNOSIS — Z6830 Body mass index (BMI) 30.0-30.9, adult: Secondary | ICD-10-CM

## 2018-10-10 DIAGNOSIS — N182 Chronic kidney disease, stage 2 (mild): Secondary | ICD-10-CM

## 2018-10-10 DIAGNOSIS — Z0001 Encounter for general adult medical examination with abnormal findings: Secondary | ICD-10-CM

## 2018-10-10 DIAGNOSIS — C61 Malignant neoplasm of prostate: Secondary | ICD-10-CM

## 2018-10-10 DIAGNOSIS — I10 Essential (primary) hypertension: Secondary | ICD-10-CM

## 2018-10-10 DIAGNOSIS — I2510 Atherosclerotic heart disease of native coronary artery without angina pectoris: Secondary | ICD-10-CM | POA: Diagnosis not present

## 2018-10-10 DIAGNOSIS — Z79899 Other long term (current) drug therapy: Secondary | ICD-10-CM

## 2018-10-10 DIAGNOSIS — Z0000 Encounter for general adult medical examination without abnormal findings: Secondary | ICD-10-CM

## 2018-10-10 DIAGNOSIS — G4700 Insomnia, unspecified: Secondary | ICD-10-CM

## 2018-10-10 DIAGNOSIS — E559 Vitamin D deficiency, unspecified: Secondary | ICD-10-CM

## 2018-10-10 DIAGNOSIS — F4310 Post-traumatic stress disorder, unspecified: Secondary | ICD-10-CM

## 2018-10-10 DIAGNOSIS — M1000 Idiopathic gout, unspecified site: Secondary | ICD-10-CM

## 2018-10-10 DIAGNOSIS — E1169 Type 2 diabetes mellitus with other specified complication: Secondary | ICD-10-CM

## 2018-10-10 DIAGNOSIS — R6889 Other general symptoms and signs: Secondary | ICD-10-CM

## 2018-10-10 DIAGNOSIS — M170 Bilateral primary osteoarthritis of knee: Secondary | ICD-10-CM

## 2018-10-10 DIAGNOSIS — E785 Hyperlipidemia, unspecified: Secondary | ICD-10-CM

## 2018-10-10 DIAGNOSIS — E1122 Type 2 diabetes mellitus with diabetic chronic kidney disease: Secondary | ICD-10-CM

## 2018-10-10 NOTE — Patient Instructions (Addendum)
Please get a diabetic eye exam, and obtain copy of report from last year and next appointment for our records  Remember to follow up with lawyer regarding advanced directives and living will, and bring by a copy of medically relevant portions to add to chart  [facility] and right knee pain - very important to be able to exercise to maintain strength, balance, energy  Try to get some exercise daily - take short walks - start with 5 min if needed, wear brace when walking  Also try floor or chair exercises, weights for upper body, leg lifts, etc for core and lower body strength - can refer to PT for help if needed   [name] Colon , Thank you for taking time to come for your Medicare Wellness Visit. I appreciate your ongoing commitment to your health goals. Please review the following plan we discussed and let me know if I can assist you in the future.   These are the goals we discussed: Goals    . Exercise 5-15 min per day    . Weight (lb) < 200 lb (90.7 kg)       This is a list of the screening recommended for you and due dates:  Health Maintenance  Topic Date Due  . Eye exam for diabetics  04/27/2018  . Tetanus Vaccine  12/10/2018*  . Complete foot exam   12/28/2018  . Hemoglobin A1C  12/31/2018  . Colon Cancer Screening  02/22/2021  . Flu Shot  Completed  . Pneumonia vaccines  Completed  *Topic was postponed. The date shown is not the original due date.     Aim for 7+ servings of fruits and vegetables daily  65-80+ fluid ounces of water or unsweet tea for healthy kidneys  Limit to max 1 drink of alcohol per day; avoid smoking/tobacco  Limit animal fats in diet for cholesterol and heart health - choose grass fed whenever available  Avoid highly processed foods, and foods high in saturated/trans fats  Aim for low stress - take time to unwind and care for your mental health  Aim for 150 min of moderate intensity exercise weekly for heart  health, and weights twice weekly for bone health  Aim for 7-9 hours of sleep daily

## 2018-10-11 LAB — CBC WITH DIFFERENTIAL/PLATELET
BASOS ABS: 10 {cells}/uL (ref 0–200)
Basophils Relative: 0.3 %
EOS PCT: 3.3 %
Eosinophils Absolute: 109 cells/uL (ref 15–500)
HEMATOCRIT: 47.2 % (ref 38.5–50.0)
Hemoglobin: 15.4 g/dL (ref 13.2–17.1)
Lymphs Abs: 1574 cells/uL (ref 850–3900)
MCH: 25.6 pg — ABNORMAL LOW (ref 27.0–33.0)
MCHC: 32.6 g/dL (ref 32.0–36.0)
MCV: 78.5 fL — ABNORMAL LOW (ref 80.0–100.0)
MPV: 10.8 fL (ref 7.5–12.5)
Monocytes Relative: 8.5 %
Neutro Abs: 1327 cells/uL — ABNORMAL LOW (ref 1500–7800)
Neutrophils Relative %: 40.2 %
Platelets: 185 10*3/uL (ref 140–400)
RBC: 6.01 10*6/uL — ABNORMAL HIGH (ref 4.20–5.80)
RDW: 15 % (ref 11.0–15.0)
Total Lymphocyte: 47.7 %
WBC mixed population: 281 cells/uL (ref 200–950)
WBC: 3.3 10*3/uL — ABNORMAL LOW (ref 3.8–10.8)

## 2018-10-11 LAB — LIPID PANEL
CHOL/HDL RATIO: 3.8 (calc) (ref <5.0)
CHOLESTEROL: 159 mg/dL (ref <200)
HDL: 42 mg/dL (ref >40)
LDL CHOLESTEROL (CALC): 96 mg/dL
Non-HDL Cholesterol (Calc): 117 mg/dL (calc) (ref <130)
Triglycerides: 113 mg/dL (ref <150)

## 2018-10-11 LAB — COMPLETE METABOLIC PANEL WITH GFR
AG Ratio: 1.7 (calc) (ref 1.0–2.5)
ALBUMIN MSPROF: 4.5 g/dL (ref 3.6–5.1)
ALT: 19 U/L (ref 9–46)
AST: 23 U/L (ref 10–35)
Alkaline phosphatase (APISO): 45 U/L (ref 40–115)
BUN: 16 mg/dL (ref 7–25)
CO2: 30 mmol/L (ref 20–32)
CREATININE: 1.17 mg/dL (ref 0.70–1.18)
Calcium: 9.9 mg/dL (ref 8.6–10.3)
Chloride: 102 mmol/L (ref 98–110)
GFR, EST NON AFRICAN AMERICAN: 59 mL/min/{1.73_m2} — AB (ref >=60)
GFR, Est African American: 69 mL/min/{1.73_m2} (ref >=60)
GLOBULIN: 2.6 g/dL (ref 1.9–3.7)
Glucose, Bld: 166 mg/dL — ABNORMAL HIGH (ref 65–99)
Potassium: 3.5 mmol/L (ref 3.5–5.3)
SODIUM: 139 mmol/L (ref 135–146)
Total Bilirubin: 0.7 mg/dL (ref 0.2–1.2)
Total Protein: 7.1 g/dL (ref 6.1–8.1)

## 2018-10-11 LAB — TSH: TSH: 2.48 mIU/L (ref 0.40–4.50)

## 2018-10-11 LAB — HEMOGLOBIN A1C
EAG (MMOL/L): 7.4 (calc)
Hgb A1c MFr Bld: 6.3 % of total Hgb — ABNORMAL HIGH (ref <5.7)
Mean Plasma Glucose: 134 (calc)

## 2018-10-11 LAB — MAGNESIUM: Magnesium: 1.9 mg/dL (ref 1.5–2.5)

## 2018-11-06 ENCOUNTER — Other Ambulatory Visit: Payer: Self-pay | Admitting: Adult Health

## 2018-11-29 ENCOUNTER — Other Ambulatory Visit: Payer: Self-pay | Admitting: Adult Health

## 2018-12-05 ENCOUNTER — Other Ambulatory Visit: Payer: Self-pay | Admitting: Internal Medicine

## 2018-12-05 ENCOUNTER — Other Ambulatory Visit: Payer: Self-pay | Admitting: Adult Health

## 2019-01-28 ENCOUNTER — Encounter: Payer: Self-pay | Admitting: Internal Medicine

## 2019-01-28 NOTE — Progress Notes (Signed)
[facility]   Unk Pinto, M.D.     Uvaldo Bristle. Silverio Lay, P.A.-C Liane Comber, [place]                [address] [place], [place] [address] Telephone [phone] Telefax [phone]  Comprehensive Evaluation & Examination     This very nice 78 y.o. MBM presents for a comprehensive evaluation and management of multiple medical co-morbidities.  Patient has been followed for HTN, HLD, T2_NIDDM  and Vitamin D Deficiency.     HTN predates since 45. Patient's BP has been controlled at home.  Today's BP is at goal - 140/80. Patient denies any cardiac symptoms as chest pain, palpitations, shortness of breath, dizziness or ankle swelling.     Patient's hyperlipidemia is controlled with diet and medications. Patient denies myalgias or other medication SE's. Last lipids were at goal: Lab Results  Component Value Date   CHOL 159 10/10/2018   HDL 42 10/10/2018   LDLCALC 96 10/10/2018   TRIG 113 10/10/2018   CHOLHDL 3.8 10/10/2018      Patient has hx/o T2_NIDDM w/CKD since 2003 and patient denies reactive hypoglycemic symptoms, visual blurring, diabetic polys or paresthesias. Patient is attempting control with diet and last A1c was not at goal: Lab Results  Component Value Date   HGBA1C 6.3 (H) 10/10/2018       Finally, patient has history of Vitamin D Deficiency ("38" / 2012)  and last vitamin D was at goal: Lab Results  Component Value Date   VD25OH 83 06/30/2018   Current Outpatient Medications on File Prior to Visit  Medication Sig  . albuterol (VENTOLIN HFA) 108 (90 Base) MCG/ACT inhaler Inhale 2 puffs into the lungs every 4 (four) hours as needed for wheezing or shortness of breath.  . allopurinol (ZYLOPRIM) 300 MG tablet Take 300 mg by mouth daily.  Marland Kitchen amLODipine (NORVASC) 5 MG tablet Take 0.5 tablets (2.5 mg total) by mouth daily.  Marland Kitchen aspirin 81 MG tablet Take 81 mg by mouth daily.  Marland Kitchen  atenolol (TENORMIN) 100 MG tablet TAKE 1 TABLET BY MOUTH EVERY DAY  . escitalopram (LEXAPRO) 20 MG tablet Take 20 mg by mouth daily.  . furosemide (LASIX) 40 MG tablet Take 1 tablet (40 mg total) by mouth daily.  Marland Kitchen losartan-hydrochlorothiazide (HYZAAR) 100-25 MG tablet Take 1 tablet by mouth daily.  . nitroGLYCERIN (NITROSTAT) 0.4 MG SL tablet Place 0.4 mg under the tongue every 5 (five) minutes as needed.    . potassium chloride SA (K-DUR,KLOR-CON) 20 MEQ tablet Take 1 tablet (20 mEq total) by mouth 2 (two) times daily.  Marland Kitchen pyridOXINE (VITAMIN B-6) 50 MG tablet Take 50 mg by mouth daily.    . risperiDONE (RISPERDAL) 2 MG tablet Take 1 mg by mouth at bedtime.   . simvastatin (ZOCOR) 80 MG tablet Take 40 mg by mouth at bedtime.    . Tamsulosin HCl (FLOMAX) 0.4 MG CAPS Take 0.4 mg by mouth daily.    Marland Kitchen zolpidem (AMBIEN) 10 MG tablet TAKE 1 TABLET(10 MG) BY MOUTH AT BEDTIME AS NEEDED   No current facility-administered medications on file prior to visit.    No Known Allergies Past Medical History:  Diagnosis Date  . Adenocarcinoma of prostate ([place]) 2004   s/p seed implantation  . Arthritis   . Axillary abscess    left  . CAD (coronary  artery disease)   . Diabetes mellitus without complication (HCC)    diet controlled  . Diverticulitis, colon   . History of colonic polyps   . HTN (hypertension)   . Hyperlipidemia   . Radiation proctitis    with chronic bleeding   Health Maintenance  Topic Date Due  . TETANUS/TDAP  11/22/2016  . OPHTHALMOLOGY EXAM  04/27/2018  . HEMOGLOBIN A1C  04/10/2019  . FOOT EXAM  01/28/2020  . COLONOSCOPY  02/22/2021  . INFLUENZA VACCINE  Completed  . PNA vac Low Risk Adult  Completed   Immunization History  Administered Date(s) Administered  . Influenza, High Dose Seasonal PF 09/08/2018  . Influenza-Unspecified 08/22/2014, 09/03/2015, 08/25/2016  . Pneumococcal Conjugate-13 09/03/2015  . Pneumococcal-Unspecified 08/25/2016  . Td 11/22/2006   Last  Colon - 02/22/2018 - Dr Henrene Pastor - Multiple Polyps - recc 3 yr f/u if medically stable  Past Surgical History:  Procedure Laterality Date  . COLONOSCOPY    . COLONOSCOPY W/ POLYPECTOMY    . CORONARY ANGIOPLASTY WITH STENT PLACEMENT  04/2008  . MASS EXCISION Left 10/06/2017   Procedure: EXCISION LEFT AXILLA CYST;  Surgeon: Erroll Luna, MD;  Location: [facility];  Service: General;  Laterality: Left;   Family History  Problem Relation Age of Onset  . Heart disease Father   . Hypertension Father   . Hypotension Mother   . Kidney disease Brother   . Colon cancer Neg Hx    Social History   Socioeconomic History  . Marital status: Married    Spouse name: Not on file  . Number of children: 2  Occupational History  . Occupation: retired    Fish farm manager: Retired  Immunologist  . Smoking status: Former Smoker    Last attempt to quit: 11/22/1992    Years since quitting: 26.2  . Smokeless tobacco: Never Used  Substance and Sexual Activity  . Alcohol use: No  . Drug use: No  . Sexual activity: Not Currently    ROS Constitutional: Denies fever, chills, weight loss/gain, headaches, insomnia,  night sweats or change in appetite. Does c/o fatigue. Eyes: Denies redness, blurred vision, diplopia, discharge, itchy or watery eyes.  ENT: Denies discharge, congestion, post nasal drip, epistaxis, sore throat, earache, hearing loss, dental pain, Tinnitus, Vertigo, Sinus pain or snoring.  Cardio: Denies chest pain, palpitations, irregular heartbeat, syncope, dyspnea, diaphoresis, orthopnea, PND, claudication or edema Respiratory: denies cough, dyspnea, DOE, pleurisy, hoarseness, laryngitis or wheezing.  Gastrointestinal: Denies dysphagia, heartburn, reflux, water brash, pain, cramps, nausea, vomiting, bloating, diarrhea, constipation, hematemesis, melena, hematochezia, jaundice or hemorrhoids Genitourinary: Denies dysuria, frequency, urgency, nocturia, hesitancy, discharge, hematuria or  flank pain Musculoskeletal: Denies arthralgia, myalgia, stiffness, Jt. Swelling, pain, limp or strain/sprain. Denies Falls. Skin: Denies puritis, rash, hives, warts, acne, eczema or change in skin lesion Neuro: No weakness, tremor, incoordination, spasms, paresthesia or pain Psychiatric: Denies confusion, memory loss or sensory loss. Denies Depression. Endocrine: Denies change in weight, skin, hair change, nocturia, and paresthesia, diabetic polys, visual blurring or hyper / hypo glycemic episodes.  Heme/Lymph: No excessive bleeding, bruising or enlarged lymph nodes.  Physical Exam  BP 140/80   Pulse 64   Temp 97.6 F (36.4 C)   Resp 16   Ht 5\' 9"  (1.753 m)   Wt 214 lb 9.6 oz (97.3 kg)   BMI 31.69 kg/m   General Appearance: Well nourished and well groomed and in no apparent distress.  Eyes: PERRLA, EOMs, conjunctiva no swelling or erythema, normal fundi and  vessels. Sinuses: No frontal/maxillary tenderness ENT/Mouth: EACs patent / TMs  nl. Nares clear without erythema, swelling, mucoid exudates. Oral hygiene is good. No erythema, swelling, or exudate. Tongue normal, non-obstructing. Tonsils not swollen or erythematous. Hearing normal.  Neck: Supple, thyroid not palpable. No bruits, nodes or JVD. Respiratory: Respiratory effort normal.  BS equal and clear bilateral without rales, rhonci, wheezing or stridor. Cardio: Heart sounds are normal with regular rate and rhythm and no murmurs, rubs or gallops. Peripheral pulses are normal and equal bilaterally without edema. No aortic or femoral bruits. Chest: symmetric with normal excursions and percussion.  Abdomen: Soft, with Nl bowel sounds. Nontender, no guarding, rebound, hernias, masses, or organomegaly.  Lymphatics: Non tender without lymphadenopathy.  Musculoskeletal: Full ROM all peripheral extremities, joint stability, 5/5 strength, and normal gait. Skin: Warm and dry without rashes, lesions, cyanosis, clubbing or  ecchymosis.   Neuro: Cranial nerves intact, reflexes equal bilaterally. Normal muscle tone, no cerebellar symptoms. Sensation intact to touch, vibratory and Monofilament to the toes bilaterally. Pysch: Alert and oriented X 3 with normal affect, insight and judgment appropriate.   Assessment and Plan  1. Essential hypertension  - EKG 12-Lead - [place], RETROPERITNL ABD,  LTD - Urinalysis, Routine w reflex microscopic - Microalbumin / creatinine urine ratio - CBC with Differential/Platelet - COMPLETE METABOLIC PANEL WITH GFR - Magnesium - TSH  2. Hyperlipidemia, mixed  - EKG 12-Lead - [place], RETROPERITNL ABD,  LTD - Lipid panel - TSH  3. Type 2 diabetes mellitus with stage 2 chronic kidney disease, without long-term current use of insulin (HCC)  - EKG 12-Lead - [place], RETROPERITNL ABD,  LTD - LOW EXTREMITY NEUR EXAM DOCUM - HM DIABETES FOOT EXAM - Hemoglobin A1c - Insulin, random  4. Vitamin D deficiency  - VITAMIN D 25 Hydroxyl  5. Idiopathic gout  - Uric acid  6. CKD stage 2 due to type 2 diabetes mellitus (HCC)  - Urinalysis, Routine w reflex microscopic - Microalbumin / creatinine urine ratio  7. Screening for colorectal cancer  - POC Hemoccult Bld/Stl  8. BPH with obstruction/lower urinary tract symptoms  - PSA  9. History of prostate cancer  - PSA  10. Prostate cancer screening  - PSA  11. Screening for ischemic heart disease  - EKG 12-Lead  12. Family history of heart disease  - EKG 12-Lead - [place], RETROPERITNL ABD,  LTD  13. Former smoker  - EKG 12-Lead - [place], RETROPERITNL ABD,  LTD  14. Screening for AAA (aortic abdominal aneurysm)  - [place], RETROPERITNL ABD,  LTD  15. Medication management  - Urinalysis, Routine w reflex microscopic - Microalbumin / creatinine urine ratio - Uric acid - CBC with Differential/Platelet - COMPLETE METABOLIC PANEL WITH GFR - Magnesium - Lipid panel - TSH - Hemoglobin A1c - Insulin, random - VITAMIN D 25  Hydroxyl           Patient was counseled in prudent diet, weight control to achieve/maintain BMI less than 25, BP monitoring, regular exercise and medications as discussed.  Discussed med effects and SE's. Routine screening labs and tests as requested with regular follow-up as recommended. Over 40 minutes of exam, counseling, chart review and high complex critical decision making was performed

## 2019-01-28 NOTE — Patient Instructions (Signed)

## 2019-01-29 ENCOUNTER — Ambulatory Visit (INDEPENDENT_AMBULATORY_CARE_PROVIDER_SITE_OTHER): Payer: Medicare Other | Admitting: Internal Medicine

## 2019-01-29 DIAGNOSIS — M1000 Idiopathic gout, unspecified site: Secondary | ICD-10-CM

## 2019-01-29 DIAGNOSIS — E559 Vitamin D deficiency, unspecified: Secondary | ICD-10-CM

## 2019-01-29 DIAGNOSIS — I10 Essential (primary) hypertension: Secondary | ICD-10-CM | POA: Diagnosis not present

## 2019-01-29 DIAGNOSIS — Z8546 Personal history of malignant neoplasm of prostate: Secondary | ICD-10-CM

## 2019-01-29 DIAGNOSIS — E1169 Type 2 diabetes mellitus with other specified complication: Secondary | ICD-10-CM

## 2019-01-29 DIAGNOSIS — Z136 Encounter for screening for cardiovascular disorders: Secondary | ICD-10-CM | POA: Diagnosis not present

## 2019-01-29 DIAGNOSIS — Z1211 Encounter for screening for malignant neoplasm of colon: Secondary | ICD-10-CM

## 2019-01-29 DIAGNOSIS — N138 Other obstructive and reflux uropathy: Secondary | ICD-10-CM

## 2019-01-29 DIAGNOSIS — Z1212 Encounter for screening for malignant neoplasm of rectum: Secondary | ICD-10-CM

## 2019-01-29 DIAGNOSIS — N182 Chronic kidney disease, stage 2 (mild): Secondary | ICD-10-CM | POA: Diagnosis not present

## 2019-01-29 DIAGNOSIS — I2510 Atherosclerotic heart disease of native coronary artery without angina pectoris: Secondary | ICD-10-CM

## 2019-01-29 DIAGNOSIS — Z79899 Other long term (current) drug therapy: Secondary | ICD-10-CM

## 2019-01-29 DIAGNOSIS — E1122 Type 2 diabetes mellitus with diabetic chronic kidney disease: Secondary | ICD-10-CM

## 2019-01-29 DIAGNOSIS — Z87891 Personal history of nicotine dependence: Secondary | ICD-10-CM

## 2019-01-29 DIAGNOSIS — N401 Enlarged prostate with lower urinary tract symptoms: Secondary | ICD-10-CM

## 2019-01-29 DIAGNOSIS — Z125 Encounter for screening for malignant neoplasm of prostate: Secondary | ICD-10-CM

## 2019-01-29 DIAGNOSIS — E782 Mixed hyperlipidemia: Secondary | ICD-10-CM | POA: Diagnosis not present

## 2019-01-29 DIAGNOSIS — E785 Hyperlipidemia, unspecified: Secondary | ICD-10-CM

## 2019-01-29 DIAGNOSIS — Z8249 Family history of ischemic heart disease and other diseases of the circulatory system: Secondary | ICD-10-CM

## 2019-01-30 LAB — CBC WITH DIFFERENTIAL/PLATELET
ABSOLUTE MONOCYTES: 343 {cells}/uL (ref 200–950)
Basophils Absolute: 21 cells/uL (ref 0–200)
Basophils Relative: 0.6 %
Eosinophils Absolute: 91 cells/uL (ref 15–500)
Eosinophils Relative: 2.6 %
HCT: 47.6 % (ref 38.5–50.0)
Hemoglobin: 15.4 g/dL (ref 13.2–17.1)
LYMPHS ABS: 1736 {cells}/uL (ref 850–3900)
MCH: 26 pg — ABNORMAL LOW (ref 27.0–33.0)
MCHC: 32.4 g/dL (ref 32.0–36.0)
MCV: 80.3 fL (ref 80.0–100.0)
MPV: 10.9 fL (ref 7.5–12.5)
Monocytes Relative: 9.8 %
Neutro Abs: 1309 cells/uL — ABNORMAL LOW (ref 1500–7800)
Neutrophils Relative %: 37.4 %
Platelets: 174 10*3/uL (ref 140–400)
RBC: 5.93 10*6/uL — ABNORMAL HIGH (ref 4.20–5.80)
RDW: 14.7 % (ref 11.0–15.0)
TOTAL LYMPHOCYTE: 49.6 %
WBC: 3.5 10*3/uL — ABNORMAL LOW (ref 3.8–10.8)

## 2019-01-30 LAB — HEMOGLOBIN A1C
Hgb A1c MFr Bld: 6.4 % of total Hgb — ABNORMAL HIGH (ref <5.7)
Mean Plasma Glucose: 137 (calc)
eAG (mmol/L): 7.6 (calc)

## 2019-01-30 LAB — URINALYSIS, ROUTINE W REFLEX MICROSCOPIC
BACTERIA UA: NONE SEEN /HPF
Bilirubin Urine: NEGATIVE
Glucose, UA: NEGATIVE
HGB URINE DIPSTICK: NEGATIVE
HYALINE CAST: NONE SEEN /LPF
Ketones, ur: NEGATIVE
Leukocytes,Ua: NEGATIVE
Nitrite: NEGATIVE
RBC / HPF: NONE SEEN /HPF (ref 0–2)
Specific Gravity, Urine: 1.021 (ref 1.001–1.03)
Squamous Epithelial / LPF: NONE SEEN /HPF (ref <=5)
WBC UA: NONE SEEN /HPF (ref 0–5)
pH: 6 (ref 5.0–8.0)

## 2019-01-30 LAB — COMPLETE METABOLIC PANEL WITH GFR
AG Ratio: 1.7 (calc) (ref 1.0–2.5)
ALKALINE PHOSPHATASE (APISO): 50 U/L (ref 35–144)
ALT: 20 U/L (ref 9–46)
AST: 24 U/L (ref 10–35)
Albumin: 4.7 g/dL (ref 3.6–5.1)
BILIRUBIN TOTAL: 0.6 mg/dL (ref 0.2–1.2)
BUN/Creatinine Ratio: 12 (calc) (ref 6–22)
BUN: 15 mg/dL (ref 7–25)
CO2: 27 mmol/L (ref 20–32)
Calcium: 10.2 mg/dL (ref 8.6–10.3)
Chloride: 104 mmol/L (ref 98–110)
Creat: 1.24 mg/dL — ABNORMAL HIGH (ref 0.70–1.18)
GFR, Est African American: 64 mL/min/{1.73_m2} (ref >=60)
GFR, Est Non African American: 55 mL/min/{1.73_m2} — ABNORMAL LOW (ref >=60)
Globulin: 2.7 g/dL (calc) (ref 1.9–3.7)
Glucose, Bld: 172 mg/dL — ABNORMAL HIGH (ref 65–99)
Potassium: 3.5 mmol/L (ref 3.5–5.3)
Sodium: 141 mmol/L (ref 135–146)
Total Protein: 7.4 g/dL (ref 6.1–8.1)

## 2019-01-30 LAB — LIPID PANEL
Cholesterol: 166 mg/dL (ref <200)
HDL: 44 mg/dL (ref >=40)
LDL CHOLESTEROL (CALC): 103 mg/dL — AB
Non-HDL Cholesterol (Calc): 122 mg/dL (calc) (ref <130)
Total CHOL/HDL Ratio: 3.8 (calc) (ref <5.0)
Triglycerides: 95 mg/dL (ref <150)

## 2019-01-30 LAB — INSULIN, RANDOM: Insulin: 20.9 u[IU]/mL — ABNORMAL HIGH

## 2019-01-30 LAB — MICROALBUMIN / CREATININE URINE RATIO
Creatinine, Urine: 198 mg/dL (ref 20–320)
MICROALB/CREAT RATIO: 35 ug/mg{creat} — AB (ref <30)
Microalb, Ur: 6.9 mg/dL

## 2019-01-30 LAB — MAGNESIUM: MAGNESIUM: 2 mg/dL (ref 1.5–2.5)

## 2019-01-30 LAB — URIC ACID: Uric Acid, Serum: 3.8 mg/dL — ABNORMAL LOW (ref 4.0–8.0)

## 2019-01-30 LAB — PSA: PSA: <0.1 ng/mL (ref <=4.0)

## 2019-01-30 LAB — VITAMIN D 25 HYDROXY (VIT D DEFICIENCY, FRACTURES): Vit D, 25-Hydroxy: 89 ng/mL (ref 30–100)

## 2019-01-30 LAB — TSH: TSH: 3.19 mIU/L (ref 0.40–4.50)

## 2019-02-14 ENCOUNTER — Other Ambulatory Visit: Payer: Self-pay | Admitting: Adult Health

## 2019-03-08 ENCOUNTER — Other Ambulatory Visit: Payer: Self-pay | Admitting: Internal Medicine

## 2019-03-08 ENCOUNTER — Other Ambulatory Visit: Payer: Self-pay | Admitting: Adult Health

## 2019-03-08 DIAGNOSIS — I10 Essential (primary) hypertension: Secondary | ICD-10-CM

## 2019-04-20 ENCOUNTER — Other Ambulatory Visit: Payer: Self-pay | Admitting: Adult Health

## 2019-05-02 NOTE — Progress Notes (Signed)
FOLLOW UP  Assessment and Plan:   Hypertension Elevated last 2 weeks; increase amlodipine from 2.5 mg to 5 mg daily, can increase further to 10 mg if needed, continue other medications Monitor blood pressure at home; patient to call if consistently greater than 140/80 Continue DASH diet.   Reminder to go to the ER if any CP, SOB, nausea, dizziness, severe HA, changes vision/speech, left arm numbness and tingling and jaw pain.  Cholesterol He is fairly above diabetic goal of <70 with simvastatin 40 mg daily Continue low cholesterol diet and exercise.  Check lipid panel.   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  Perform daily foot/skin check, notify office of any concerning changes.  Check A1C  Obesity with co morbidities Long discussion about weight loss, diet, and exercise Recommended diet heavy in fruits and veggies and low in animal meats, cheeses, and dairy products, appropriate calorie intake Discussed ideal weight for height  Will follow up in 3 months  Vitamin D Def At goal at last visit; continue supplementation to maintain goal of 60-100 Defer Vit D level  Gout Continue allopurinol Diet discussed Check uric acid as needed  Continue diet and meds as discussed. Further disposition pending results of labs. Discussed med's effects and SE's.   Over 30 minutes of exam, counseling, chart review, and critical decision making was performed.   Future Appointments  Date Time Provider [place]  08/07/2019  9:30 AM Unk Pinto, MD [facility] None  11/07/2019 11:15 AM Liane Comber, NP [facility] None  02/26/2020 10:00 AM Unk Pinto, MD [facility] None    ----------------------------------------------------------------------------------------------------------------------  HPI 78 y.o. male  presents for 3 month follow up on hypertension, cholesterol, diet-controlled diabetes, obesity, gout and  vitamin D deficiency. Gout controlled on Allopurinol. Other problems include PTSD followed at the [facility]/[place], on lexapro, risperidone and ambien.  BMI is Body mass index is 31.31 kg/m., he has been working on diet and exercise, eating 2 meals a day, exercise limited by knee pain (followed by [place]).  Wt Readings from Last 3 Encounters:  05/03/19 212 lb (96.2 kg)  01/29/19 214 lb 9.6 oz (97.3 kg)  10/10/18 212 lb (96.2 kg)   His blood pressure hasnot been controlled at home, today their BP is BP: (!) 150/80 He reports BPs have been elevated at home for the last 1-2 weeks; he attributes this to taking more aleve due to knee pain.   He does not workout. He denies chest pain, shortness of breath, dizziness.   He is on cholesterol medication (simvastatin 40 mg daily) and denies myalgias. His cholesterol is not at goal. The cholesterol last visit was:   Lab Results  Component Value Date   CHOL 166 01/29/2019   HDL 44 01/29/2019   LDLCALC 103 (H) 01/29/2019   TRIG 95 01/29/2019   CHOLHDL 3.8 01/29/2019    He has been working on diet and exercise for diet controlled diabetes, and denies foot ulcerations, increased appetite, nausea, paresthesia of the feet, polydipsia, polyuria, visual disturbances, vomiting and weight loss. Last A1C in the office was:  Lab Results  Component Value Date   HGBA1C 6.4 (H) 01/29/2019   Patient is on Vitamin D supplement and at goal at recent check:    Lab Results  Component Value Date   VD25OH 89 01/29/2019     Patient is on allopurinol for gout and does not report a recent flare.  Lab Results  Component Value Date  LABURIC 3.8 (L) 01/29/2019      Current Medications:  Current Outpatient Medications on File Prior to Visit  Medication Sig  . albuterol (VENTOLIN HFA) 108 (90 Base) MCG/ACT inhaler Inhale 2 puffs into the lungs every 4 (four) hours as needed for wheezing or shortness of breath.  . allopurinol (ZYLOPRIM) 300 MG tablet Take 300  mg by mouth daily.  Marland Kitchen amLODipine (NORVASC) 5 MG tablet Take 0.5 tablets (2.5 mg total) by mouth daily.  Marland Kitchen aspirin 81 MG tablet Take 81 mg by mouth daily.  Marland Kitchen atenolol (TENORMIN) 100 MG tablet TAKE 1 TABLET BY MOUTH EVERY DAY  . escitalopram (LEXAPRO) 20 MG tablet Take 20 mg by mouth daily.  . furosemide (LASIX) 40 MG tablet Take 1 tablet (40 mg total) by mouth daily.  Marland Kitchen losartan-hydrochlorothiazide (HYZAAR) 100-25 MG tablet TAKE 1 TABLET BY MOUTH DAILY  . nitroGLYCERIN (NITROSTAT) 0.4 MG SL tablet Place 0.4 mg under the tongue every 5 (five) minutes as needed.    . potassium chloride SA (K-DUR,KLOR-CON) 20 MEQ tablet Take 1 tablet (20 mEq total) by mouth 2 (two) times daily.  Marland Kitchen pyridOXINE (VITAMIN B-6) 50 MG tablet Take 50 mg by mouth daily.    . risperiDONE (RISPERDAL) 2 MG tablet Take 1 mg by mouth at bedtime.   . simvastatin (ZOCOR) 80 MG tablet Take 40 mg by mouth at bedtime.    . Tamsulosin HCl (FLOMAX) 0.4 MG CAPS Take 0.4 mg by mouth daily.    Marland Kitchen zolpidem (AMBIEN) 10 MG tablet TAKE 1 TABLET(10 MG) BY MOUTH AT BEDTIME AS NEEDED   No current facility-administered medications on file prior to visit.      Allergies: No Known Allergies   Medical History:  Past Medical History:  Diagnosis Date  . 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 Arthritis   . Axillary abscess    left  . CAD (coronary artery disease)   . Diabetes mellitus without complication (HCC)    diet controlled  . Diverticulitis, colon   . History of colonic polyps   . HTN (hypertension)   . Hyperlipidemia   . Radiation proctitis    with chronic bleeding   Family history- Reviewed and unchanged Social history- Reviewed and unchanged   Review of Systems:  Review of Systems  Constitutional: Negative for malaise/fatigue and weight loss.  HENT: Negative for hearing loss and tinnitus.   Eyes: Negative for blurred vision and double vision.  Respiratory: Negative for cough, shortness of  breath and wheezing.   Cardiovascular: Negative for chest pain, palpitations, orthopnea, claudication and leg swelling.  Gastrointestinal: Negative for abdominal pain, blood in stool, constipation, diarrhea, heartburn, melena, nausea and vomiting.  Genitourinary: Negative.   Musculoskeletal: Negative for joint pain (R knee, [facility]) and myalgias.  Skin: Negative for rash.  Neurological: Negative for dizziness, tingling, sensory change, weakness and headaches.  Endo/Heme/Allergies: Negative for polydipsia.  Psychiatric/Behavioral: Negative.   All other systems reviewed and are negative.    Physical Exam: BP (!) 150/80   Pulse (!) 52   Temp (!) 97.5 F (36.4 C)   Ht 5\' 9"  (1.753 m)   Wt 212 lb (96.2 kg)   SpO2 96%   BMI 31.31 kg/m  Wt Readings from Last 3 Encounters:  05/03/19 212 lb (96.2 kg)  01/29/19 214 lb 9.6 oz (97.3 kg)  10/10/18 212 lb (96.2 kg)   General Appearance: Well nourished, in no apparent distress. Eyes: PERRLA, EOMs, conjunctiva no  swelling or erythema Sinuses: No Frontal/maxillary tenderness ENT/Mouth: Bil Ext aud canals clear, TMs without erythema, bulging. No erythema, swelling, or exudate on post pharynx.  Tonsils not swollen or erythematous. Hearing normal.  Neck: Supple, thyroid normal.  Respiratory: Respiratory effort normal, BS equal bilaterally without rales, rhonchi, wheezing or stridor.  Cardio: RRR with no MRGs. Brisk peripheral pulses without edema.  Abdomen: Soft, + BS.  Non tender, no guarding, rebound, hernias, masses. Lymphatics: Non tender without lymphadenopathy.  Musculoskeletal: Full ROM, 5/5 strength, slow steady gait, no obvious bony abnormality or effusion Skin: Warm, dry without rashes, lesions, ecchymosis.  Neuro: Cranial nerves intact. No cerebellar symptoms.  Psych: Awake and oriented X 3, normal affect, Insight and Judgment appropriate.    Izora Ribas, NP 9:37 AM [facility]

## 2019-05-03 ENCOUNTER — Other Ambulatory Visit: Payer: Self-pay

## 2019-05-03 ENCOUNTER — Encounter: Payer: Self-pay | Admitting: Adult Health

## 2019-05-03 ENCOUNTER — Ambulatory Visit (INDEPENDENT_AMBULATORY_CARE_PROVIDER_SITE_OTHER): Payer: Medicare Other | Admitting: Adult Health

## 2019-05-03 VITALS — BP 150/80 | HR 52 | Temp 97.5°F | Ht 69.0 in | Wt 212.0 lb

## 2019-05-03 DIAGNOSIS — E119 Type 2 diabetes mellitus without complications: Secondary | ICD-10-CM

## 2019-05-03 DIAGNOSIS — E559 Vitamin D deficiency, unspecified: Secondary | ICD-10-CM

## 2019-05-03 DIAGNOSIS — Z79899 Other long term (current) drug therapy: Secondary | ICD-10-CM

## 2019-05-03 DIAGNOSIS — E1122 Type 2 diabetes mellitus with diabetic chronic kidney disease: Secondary | ICD-10-CM

## 2019-05-03 DIAGNOSIS — E669 Obesity, unspecified: Secondary | ICD-10-CM

## 2019-05-03 DIAGNOSIS — I2510 Atherosclerotic heart disease of native coronary artery without angina pectoris: Secondary | ICD-10-CM

## 2019-05-03 DIAGNOSIS — G4700 Insomnia, unspecified: Secondary | ICD-10-CM

## 2019-05-03 DIAGNOSIS — N182 Chronic kidney disease, stage 2 (mild): Secondary | ICD-10-CM

## 2019-05-03 DIAGNOSIS — M1000 Idiopathic gout, unspecified site: Secondary | ICD-10-CM

## 2019-05-03 DIAGNOSIS — I10 Essential (primary) hypertension: Secondary | ICD-10-CM

## 2019-05-03 DIAGNOSIS — E785 Hyperlipidemia, unspecified: Secondary | ICD-10-CM

## 2019-05-03 DIAGNOSIS — E1169 Type 2 diabetes mellitus with other specified complication: Secondary | ICD-10-CM

## 2019-05-03 MED ORDER — AMLODIPINE BESYLATE 5 MG PO TABS: 5.0000 mg | ORAL_TABLET | Freq: Every day | ORAL | 3 refills | Status: DC

## 2019-05-03 NOTE — Patient Instructions (Addendum)
Goals    . Blood Pressure < 140/80    . Exercise 15 min per day    . Weight (lb) < 200 lb (90.7 kg)      Please increase amlodipine from 2.5 mg (1/2 tab) to whole tab (5 mg)  Please follow up with your [facility] audiologist or ENT about your hearing aids   HYPERTENSION INFORMATION  Monitor your blood pressure at home, please keep a record and bring that in with you to your next office visit.   Go to the ER if any CP, SOB, nausea, dizziness, severe HA, changes vision/speech  Testing/Procedures: HOW TO TAKE YOUR BLOOD PRESSURE:  Rest 5 minutes before taking your blood pressure.  Don't smoke or drink caffeinated beverages for at least 30 minutes before.  Take your blood pressure before (not after) you eat.  Sit comfortably with your back supported and both feet on the floor (don't cross your legs).  Elevate your arm to heart level on a table or a desk.  Use the proper sized cuff. It should fit smoothly and snugly around your bare upper arm. There should be enough room to slip a fingertip under the cuff. The bottom edge of the cuff should be 1 inch above the crease of the elbow.  Your most recent BP: BP: (!) 150/80   Take your medications faithfully as instructed. Maintain a healthy weight. Get at least 150 minutes of aerobic exercise per week. Minimize salt intake. Minimize alcohol intake  DASH Eating Plan DASH stands for "Dietary Approaches to Stop Hypertension." The DASH eating plan is a healthy eating plan that has been shown to reduce high blood pressure (hypertension). Additional health benefits may include reducing the risk of type 2 diabetes mellitus, heart disease, and stroke. The DASH eating plan may also help with weight loss. WHAT DO I NEED TO KNOW ABOUT THE DASH EATING PLAN? For the DASH eating plan, you will follow these general guidelines:  Choose foods with a percent daily value for sodium of less than 5% (as listed on the food label).  Use salt-free seasonings or  herbs instead of table salt or sea salt.  Check with your health care provider or pharmacist before using salt substitutes.  Eat lower-sodium products, often labeled as "lower sodium" or "no salt added."  Eat fresh foods.  Eat more vegetables, fruits, and low-fat dairy products.  Choose whole grains. Look for the word "whole" as the first word in the ingredient list.  Choose fish and skinless chicken or [place] more often than red meat. Limit fish, poultry, and meat to 6 oz (170 g) each day.  Limit sweets, desserts, sugars, and sugary drinks.  Choose heart-healthy fats.  Limit cheese to 1 oz (28 g) per day.  Eat more home-cooked food and less restaurant, buffet, and fast food.  Limit fried foods.  Cook foods using methods other than frying.  Limit canned vegetables. If you do use them, rinse them well to decrease the sodium.  When eating at a restaurant, ask that your food be prepared with less salt, or no salt if possible. WHAT FOODS CAN I EAT? Seek help from a dietitian for individual calorie needs. Grains Whole grain or whole wheat bread. Brown rice. Whole grain or whole wheat pasta. Quinoa, bulgur, and whole grain cereals. Low-sodium cereals. Corn or whole wheat flour tortillas. Whole grain cornbread. Whole grain crackers. Low-sodium crackers. Vegetables Fresh or frozen vegetables (raw, steamed, roasted, or grilled). Low-sodium or reduced-sodium tomato and vegetable juices. Low-sodium  or reduced-sodium tomato sauce and paste. Low-sodium or reduced-sodium canned vegetables.  Fruits All fresh, canned (in natural juice), or frozen fruits. Meat and Other Protein Products Ground beef (85% or leaner), grass-fed beef, or beef trimmed of fat. Skinless chicken or [place]. Ground chicken or [place]. Pork trimmed of fat. All fish and seafood. Eggs. Dried beans, peas, or lentils. Unsalted nuts and seeds. Unsalted canned beans. Dairy Low-fat dairy products, such as skim or 1% milk, 2% or  reduced-fat cheeses, low-fat ricotta or cottage cheese, or plain low-fat yogurt. Low-sodium or reduced-sodium cheeses. Fats and Oils Tub margarines without trans fats. Light or reduced-fat mayonnaise and salad dressings (reduced sodium). Avocado. Safflower, olive, or canola oils. Natural peanut or almond butter. Other Unsalted popcorn and pretzels. The items listed above may not be a complete list of recommended foods or beverages. Contact your dietitian for more options. WHAT FOODS ARE NOT RECOMMENDED? Grains White bread. White pasta. White rice. Refined cornbread. Bagels and croissants. Crackers that contain trans fat. Vegetables Creamed or fried vegetables. Vegetables in a cheese sauce. Regular canned vegetables. Regular canned tomato sauce and paste. Regular tomato and vegetable juices. Fruits Dried fruits. Canned fruit in light or heavy syrup. Fruit juice. Meat and Other Protein Products Fatty cuts of meat. Ribs, chicken wings, bacon, sausage, bologna, salami, chitterlings, fatback, hot dogs, bratwurst, and packaged luncheon meats. Salted nuts and seeds. Canned beans with salt. Dairy Whole or 2% milk, cream, half-and-half, and cream cheese. Whole-fat or sweetened yogurt. Full-fat cheeses or blue cheese. Nondairy creamers and whipped toppings. Processed cheese, cheese spreads, or cheese curds. Condiments Onion and garlic salt, seasoned salt, table salt, and sea salt. Canned and packaged gravies. Worcestershire sauce. Tartar sauce. Barbecue sauce. Teriyaki sauce. Soy sauce, including reduced sodium. Steak sauce. Fish sauce. Oyster sauce. Cocktail sauce. Horseradish. Ketchup and mustard. Meat flavorings and tenderizers. Bouillon cubes. Hot sauce. Tabasco sauce. Marinades. Taco seasonings. Relishes. Fats and Oils Butter, stick margarine, lard, shortening, ghee, and bacon fat. Coconut, palm kernel, or palm oils. Regular salad dressings. Other Pickles and olives. Salted popcorn and  pretzels. The items listed above may not be a complete list of foods and beverages to avoid. Contact your dietitian for more information. WHERE CAN I FIND MORE INFORMATION? National Heart, Lung, and Blood Institute: travelstabloid.com Document Released: 10/28/2011 Document Revised: 03/25/2014 Document Reviewed: 09/12/2013 [facility] Patient Information 2015 [place], [place]. This information is not intended to replace advice given to you by your health care provider. Make sure you discuss any questions you have with your health care provider.

## 2019-05-04 LAB — CBC WITH DIFFERENTIAL/PLATELET
Absolute Monocytes: 309 cells/uL (ref 200–950)
Basophils Absolute: 20 cells/uL (ref 0–200)
Basophils Relative: 0.6 %
Eosinophils Absolute: 109 cells/uL (ref 15–500)
Eosinophils Relative: 3.2 %
HCT: 48.9 % (ref 38.5–50.0)
Hemoglobin: 15.4 g/dL (ref 13.2–17.1)
Lymphs Abs: 1605 cells/uL (ref 850–3900)
MCH: 25.4 pg — ABNORMAL LOW (ref 27.0–33.0)
MCHC: 31.5 g/dL — ABNORMAL LOW (ref 32.0–36.0)
MCV: 80.6 fL (ref 80.0–100.0)
MPV: 11.6 fL (ref 7.5–12.5)
Monocytes Relative: 9.1 %
Neutro Abs: 1357 cells/uL — ABNORMAL LOW (ref 1500–7800)
Neutrophils Relative %: 39.9 %
Platelets: 179 10*3/uL (ref 140–400)
RBC: 6.07 10*6/uL — ABNORMAL HIGH (ref 4.20–5.80)
RDW: 15.1 % — ABNORMAL HIGH (ref 11.0–15.0)
Total Lymphocyte: 47.2 %
WBC: 3.4 10*3/uL — ABNORMAL LOW (ref 3.8–10.8)

## 2019-05-04 LAB — LIPID PANEL
Cholesterol: 154 mg/dL (ref <200)
HDL: 45 mg/dL (ref >=40)
LDL Cholesterol (Calc): 90 mg/dL (calc)
Non-HDL Cholesterol (Calc): 109 mg/dL (calc) (ref <130)
Total CHOL/HDL Ratio: 3.4 (calc) (ref <5.0)
Triglycerides: 96 mg/dL (ref <150)

## 2019-05-04 LAB — COMPLETE METABOLIC PANEL WITH GFR
AG Ratio: 1.9 (calc) (ref 1.0–2.5)
ALT: 18 U/L (ref 9–46)
AST: 22 U/L (ref 10–35)
Albumin: 4.5 g/dL (ref 3.6–5.1)
Alkaline phosphatase (APISO): 46 U/L (ref 35–144)
BUN: 18 mg/dL (ref 7–25)
CO2: 27 mmol/L (ref 20–32)
Calcium: 10.2 mg/dL (ref 8.6–10.3)
Chloride: 103 mmol/L (ref 98–110)
Creat: 1.13 mg/dL (ref 0.70–1.18)
GFR, Est African American: 72 mL/min/{1.73_m2} (ref >=60)
GFR, Est Non African American: 62 mL/min/{1.73_m2} (ref >=60)
Globulin: 2.4 g/dL (calc) (ref 1.9–3.7)
Glucose, Bld: 167 mg/dL — ABNORMAL HIGH (ref 65–99)
Potassium: 3.6 mmol/L (ref 3.5–5.3)
Sodium: 141 mmol/L (ref 135–146)
Total Bilirubin: 0.8 mg/dL (ref 0.2–1.2)
Total Protein: 6.9 g/dL (ref 6.1–8.1)

## 2019-05-04 LAB — HEMOGLOBIN A1C
Hgb A1c MFr Bld: 6.3 % of total Hgb — ABNORMAL HIGH (ref <5.7)
Mean Plasma Glucose: 134 (calc)
eAG (mmol/L): 7.4 (calc)

## 2019-05-04 LAB — TSH: TSH: 2.46 mIU/L (ref 0.40–4.50)

## 2019-05-04 LAB — MAGNESIUM: Magnesium: 2 mg/dL (ref 1.5–2.5)

## 2019-06-06 ENCOUNTER — Other Ambulatory Visit: Payer: Self-pay | Admitting: Adult Health

## 2019-06-11 NOTE — Progress Notes (Signed)
Cardiology Office Note   Date:  06/13/2019   ID:  [name] Colon, [name] Colon 11-Sep-1941, MRN [id number]  PCP:  [name] Pinto, MD  Cardiologist:   [name] Breeding, MD   No chief complaint on file.     History of Present Illness: [name] Colon is a 78 y.o. male who presents for follow up of his CAD .  I have not seen him since 2017.  He did have cardiac catheterization 2009.  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  There was 99% distal circumflex stenosis.  The EF was well-preserved.  He underwent PCI and stenting of the circumflex.  He was otherwise managed medically.  He has not been back for a few years but he is done well.  He had none of the chest discomfort that he had at that time.  He walks slowly because of some knee pain.  However, he goes up and down stairs routinely.  With this he denies any chest pressure, neck or arm discomfort.  He does not have any palpitations, presyncope or syncope.  He has no PND or orthopnea.  He did have a negative Lexiscan perfusion study in 2017.   Past Medical History:  Diagnosis Date  . 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 Arthritis   . Axillary abscess    left  . CAD (coronary artery disease)   . Diverticulitis, colon   . History of colonic polyps   . HTN (hypertension)   . Hyperglycemia    diet controlled  . Hyperlipidemia   . Radiation proctitis    with chronic bleeding    Past Surgical History:  Procedure Laterality Date  . COLONOSCOPY    . COLONOSCOPY W/ POLYPECTOMY    . CORONARY ANGIOPLASTY WITH STENT PLACEMENT  04/2008  . MASS EXCISION Left 10/06/2017   Procedure: EXCISION LEFT AXILLA CYST;  Surgeon: [name] Luna, MD;  Location: [facility];  Service: General;  Laterality: Left;     Current Outpatient Medications  Medication Sig Dispense Refill  . albuterol (VENTOLIN HFA) 108 (90 Base) MCG/ACT inhaler Inhale 2 puffs into the lungs every 4 (four) hours  as needed for wheezing or shortness of breath. 1 Inhaler 1  . allopurinol (ZYLOPRIM) 300 MG tablet Take 300 mg by mouth daily.    Marland Kitchen amLODipine (NORVASC) 5 MG tablet Take 1 tablet (5 mg total) by mouth daily. 90 tablet 3  . aspirin 81 MG tablet Take 81 mg by mouth daily.    Marland Kitchen atenolol (TENORMIN) 100 MG tablet TAKE 1 TABLET BY MOUTH EVERY DAY 90 tablet 1  . escitalopram (LEXAPRO) 20 MG tablet Take 20 mg by mouth daily.    . furosemide (LASIX) 40 MG tablet Take 1 tablet (40 mg total) by mouth daily. 30 tablet 3  . losartan-hydrochlorothiazide (HYZAAR) 100-25 MG tablet TAKE 1 TABLET BY MOUTH DAILY 90 tablet 3  . nitroGLYCERIN (NITROSTAT) 0.4 MG SL tablet Place 0.4 mg under the tongue every 5 (five) minutes as needed.      . potassium chloride SA (K-DUR) 20 MEQ tablet Take 1 tablet 2 x /day 180 tablet 3  . pyridOXINE (VITAMIN B-6) 50 MG tablet Take 50 mg by mouth daily.      . risperiDONE (RISPERDAL) 2 MG tablet Take 1 mg by mouth at bedtime.     . Tamsulosin HCl (FLOMAX) 0.4 MG CAPS Take 0.4 mg by mouth daily.      Marland Kitchen  zolpidem (AMBIEN) 10 MG tablet TAKE 1 TABLET(10 MG) BY MOUTH AT BEDTIME AS NEEDED 30 tablet 1  . rosuvastatin (CRESTOR) 40 MG tablet Take 1 tablet (40 mg total) by mouth daily. 30 tablet 12   No current facility-administered medications for this visit.     Allergies:   Patient has no known allergies.    ROS:  Please see the history of present illness.   Otherwise, review of systems are positive for knee pain.   All other systems are reviewed and negative.    PHYSICAL EXAM: VS:  BP 130/68   Pulse (!) 57   Temp 98.2 F (36.8 C) (Temporal)   Ht 5\' 9"  (1.753 m)   Wt 206 lb 4.8 oz (93.6 kg)   SpO2 95%   BMI 30.47 kg/m  , BMI Body mass index is 30.47 kg/m. GENERAL:  Well appearing HEENT:  Pupils equal round and reactive, fundi not visualized, oral mucosa unremarkable NECK:  No jugular venous distention, waveform within normal limits, carotid upstroke brisk and symmetric, no  bruits, no thyromegaly LYMPHATICS:  No cervical, inguinal adenopathy LUNGS:  Clear to auscultation bilaterally BACK:  No CVA tenderness CHEST:  Unremarkable HEART:  PMI not displaced or sustained,S1 and S2 within normal limits, no S3, no S4, no clicks, no rubs, no murmurs ABD:  Flat, positive bowel sounds normal in frequency in pitch, no bruits, no rebound, no guarding, no midline pulsatile mass, no hepatomegaly, no splenomegaly EXT:  2 plus pulses throughout, no edema, no cyanosis no clubbing SKIN:  No rashes no nodules NEURO:  Cranial nerves II through XII grossly intact, motor grossly intact throughout PSYCH:  Cognitively intact, oriented to person place and time    EKG:  EKG is not ordered today. The ekg ordered 01/29/2019 demonstrates sinus rhythm, rate 57, left axis deviation, intervals within normal limits, no acute ST-T wave changes.   Recent Labs: 05/03/2019: ALT 18; BUN 18; Creat 1.13; Hemoglobin 15.4; Magnesium 2.0; Platelets 179; Potassium 3.6; Sodium 141; TSH 2.46    Lipid Panel    Component Value Date/Time   CHOL 154 05/03/2019 0952   TRIG 96 05/03/2019 0952   HDL 45 05/03/2019 0952   CHOLHDL 3.4 05/03/2019 0952   VLDL 24 06/14/2017 1136   LDLCALC 90 05/03/2019 0952      Wt Readings from Last 3 Encounters:  06/13/19 206 lb 4.8 oz (93.6 kg)  05/03/19 212 lb (96.2 kg)  01/29/19 214 lb 9.6 oz (97.3 kg)      Other studies Reviewed: Additional studies/ records that were reviewed today include: Labs. Review of the above records demonstrates:  Please see elsewhere in the note.     ASSESSMENT AND PLAN:  CAD: He has no ongoing symptoms or new symptoms since his 2017 stress test.  No further cardiovascular testing is suggested.  He will continue with risk reduction.  HYPERLIPIDEMIA: His LDL earlier this year was 53.  He is not at target.  And then changing to Crestor 40 mg daily and repeat a lipid profile and liver enzymes in 10 weeks.  HTN: Blood pressures well  controlled and he will continue the meds as listed.   Current medicines are reviewed at length with the patient today.  The patient does not have concerns regarding medicines.  The following changes have been made:  As above  Labs/ tests ordered today include:   Orders Placed This Encounter  Procedures  . Lipid Profile  . Hepatic function panel     Disposition:  FU with me in one year.     Signed, [name] Breeding, MD  06/13/2019 1:03 PM    [place]

## 2019-06-12 ENCOUNTER — Telehealth: Payer: Self-pay

## 2019-06-12 NOTE — Telephone Encounter (Signed)
7-22 @11AM  [facility] APPT-COVID QUESTIONS UNABLE TO LMVM MAIL BOX IS FULL

## 2019-06-13 ENCOUNTER — Ambulatory Visit (INDEPENDENT_AMBULATORY_CARE_PROVIDER_SITE_OTHER): Payer: Medicare Other | Admitting: Cardiology

## 2019-06-13 ENCOUNTER — Other Ambulatory Visit: Payer: Self-pay

## 2019-06-13 ENCOUNTER — Encounter: Payer: Self-pay | Admitting: Cardiology

## 2019-06-13 VITALS — BP 130/68 | HR 57 | Temp 98.2°F | Ht 69.0 in | Wt 206.3 lb

## 2019-06-13 DIAGNOSIS — Z79899 Other long term (current) drug therapy: Secondary | ICD-10-CM | POA: Diagnosis not present

## 2019-06-13 DIAGNOSIS — I10 Essential (primary) hypertension: Secondary | ICD-10-CM | POA: Diagnosis not present

## 2019-06-13 DIAGNOSIS — E785 Hyperlipidemia, unspecified: Secondary | ICD-10-CM | POA: Diagnosis not present

## 2019-06-13 DIAGNOSIS — I2510 Atherosclerotic heart disease of native coronary artery without angina pectoris: Secondary | ICD-10-CM | POA: Diagnosis not present

## 2019-06-13 DIAGNOSIS — E1169 Type 2 diabetes mellitus with other specified complication: Secondary | ICD-10-CM | POA: Diagnosis not present

## 2019-06-13 MED ORDER — ROSUVASTATIN CALCIUM 40 MG PO TABS: 40.0000 mg | ORAL_TABLET | Freq: Every day | ORAL | 12 refills | Status: DC

## 2019-06-13 NOTE — Patient Instructions (Addendum)
Medication Instructions:  STOP SIMVASTATIN START ROSUVASTATIN 40MG  AFTER DINNER/BEDTIME If you need a refill on your cardiac medications before your next appointment, please call your pharmacy.  Labwork: FASTING LIPID AND LFT-ABOUT 08-22-2019 HERE IN OUR OFFICE AT [facility]    You will need to fast. DO NOT EAT OR DRINK PAST MIDNIGHT.       Take the provided lab slips with you to the lab for your blood draw.   When you have your labs (blood work) drawn today and your tests are completely normal, you will receive your results only by MyChart Message (if you have MyChart) -OR-  A paper copy in the mail.  If you have any lab test that is abnormal or we need to change your treatment, we will call you to review these results.  Follow-Up: You will need a follow up appointment in 12 months.  Please call our office 2 months in advance to schedule this appointment.  You may see Minus Breeding, MD or one of the following Advanced Practice Providers on your designated Care Team:   Rosaria Ferries, PA-C . Jory Sims, DNP, ANP      At [facility], you and your health needs are our priority.  As part of our continuing mission to provide you with exceptional heart care, we have created designated Provider Care Teams.  These Care Teams include your primary Cardiologist (physician) and Advanced Practice Providers (APPs -  Physician Assistants and Nurse Practitioners) who all work together to provide you with the care you need, when you need it.  Thank you for choosing [facility]!!

## 2019-06-20 LAB — HEPATIC FUNCTION PANEL
ALT: 22 IU/L (ref 0–44)
AST: 25 IU/L (ref 0–40)
Albumin: 4.7 g/dL (ref 3.7–4.7)
Alkaline Phosphatase: 50 IU/L (ref 39–117)
Bilirubin Total: 0.6 mg/dL (ref 0.0–1.2)
Bilirubin, Direct: 0.22 mg/dL (ref 0.00–0.40)
Total Protein: 7.3 g/dL (ref 6.0–8.5)

## 2019-06-20 LAB — LIPID PANEL
Chol/HDL Ratio: 3.2 ratio (ref 0.0–5.0)
Cholesterol, Total: 134 mg/dL (ref 100–199)
HDL: 42 mg/dL (ref >39)
LDL Calculated: 80 mg/dL (ref 0–99)
Triglycerides: 61 mg/dL (ref 0–149)
VLDL Cholesterol Cal: 12 mg/dL (ref 5–40)

## 2019-06-22 ENCOUNTER — Telehealth: Payer: Self-pay | Admitting: *Deleted

## 2019-06-22 DIAGNOSIS — E1169 Type 2 diabetes mellitus with other specified complication: Secondary | ICD-10-CM

## 2019-06-22 DIAGNOSIS — I10 Essential (primary) hypertension: Secondary | ICD-10-CM

## 2019-06-22 DIAGNOSIS — Z5181 Encounter for therapeutic drug level monitoring: Secondary | ICD-10-CM

## 2019-06-22 NOTE — Telephone Encounter (Signed)
-----   Message from Minus Breeding, MD sent at 06/22/2019 12:03 PM EDT ----- His LDL was not yet at target but better.  We went up on his Crestor recently to 40 mg.  Please make sure that he is taking this.  I would repeat this in about 6 months.  Call Mr. Czerwinski with the results and send results to Unk Pinto, MD

## 2019-06-22 NOTE — Telephone Encounter (Signed)
Confirmed taking Crestor 40 mg. Mailed lab orders to recheck in 6 months, advised to watch diet

## 2019-08-07 ENCOUNTER — Encounter: Payer: Self-pay | Admitting: Internal Medicine

## 2019-08-07 ENCOUNTER — Other Ambulatory Visit: Payer: Self-pay

## 2019-08-07 ENCOUNTER — Ambulatory Visit (INDEPENDENT_AMBULATORY_CARE_PROVIDER_SITE_OTHER): Payer: Medicare Other | Admitting: Internal Medicine

## 2019-08-07 VITALS — BP 120/76 | HR 56 | Temp 97.1°F | Resp 16 | Ht 69.0 in | Wt 208.8 lb

## 2019-08-07 DIAGNOSIS — E785 Hyperlipidemia, unspecified: Secondary | ICD-10-CM

## 2019-08-07 DIAGNOSIS — I10 Essential (primary) hypertension: Secondary | ICD-10-CM | POA: Diagnosis not present

## 2019-08-07 DIAGNOSIS — E1169 Type 2 diabetes mellitus with other specified complication: Secondary | ICD-10-CM

## 2019-08-07 DIAGNOSIS — E559 Vitamin D deficiency, unspecified: Secondary | ICD-10-CM

## 2019-08-07 DIAGNOSIS — E1122 Type 2 diabetes mellitus with diabetic chronic kidney disease: Secondary | ICD-10-CM | POA: Diagnosis not present

## 2019-08-07 DIAGNOSIS — I2510 Atherosclerotic heart disease of native coronary artery without angina pectoris: Secondary | ICD-10-CM

## 2019-08-07 DIAGNOSIS — Z79899 Other long term (current) drug therapy: Secondary | ICD-10-CM

## 2019-08-07 DIAGNOSIS — N182 Chronic kidney disease, stage 2 (mild): Secondary | ICD-10-CM

## 2019-08-07 NOTE — Progress Notes (Signed)
History of Present Illness:      This very nice 78 y.o. MBM presents for 6 month follow up with HTN, HLD, T2_NIDDM and Vitamin D Deficiency. Patient has hx/o Gout controlled on Allopurinol.       Patient is treated for HTN (1990)  & BP has been controlled at home. Today's BP  is at goal - 120/76.  In 2009, patient had PCA/Stent and Lexiscan in 2017 was negative. Patient has had no complaints of any cardiac type chest pain, palpitations, dyspnea / orthopnea / PND, dizziness, claudication, or dependent edema.      Hyperlipidemia is controlled with diet & meds. Patient denies myalgias or other med SE's. 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  He's attempting control with wiet with limited success and last A1c was not at goal: Lab Results  Component Value Date   HGBA1C 6.3 (H) 05/03/2019       Further, the patient also has history of Vitamin D Deficiency("38" / 2012) and supplements vitamin D without any suspected side-effects. Last vitamin D was at goal: Lab Results  Component Value Date   VD25OH 89 01/29/2019   Current Outpatient Medications on File Prior to Visit  Medication Sig  . albuterol (VENTOLIN HFA) 108 (90 Base) MCG/ACT inhaler Inhale 2 puffs into the lungs every 4 (four) hours as needed for wheezing or shortness of breath.  . allopurinol (ZYLOPRIM) 300 MG tablet Take 300 mg by mouth daily.  Marland Kitchen amLODipine (NORVASC) 5 MG tablet Take 1 tablet (5 mg total) by mouth daily.  Marland Kitchen aspirin 81 MG tablet Take 81 mg by mouth daily.  Marland Kitchen atenolol (TENORMIN) 100 MG tablet TAKE 1 TABLET BY MOUTH EVERY DAY  . escitalopram (LEXAPRO) 20 MG tablet Take 20 mg by mouth daily.  Marland Kitchen  losartan-hydrochlorothiazide (HYZAAR) 100-25 MG tablet TAKE 1 TABLET BY MOUTH DAILY  . nitroGLYCERIN (NITROSTAT) 0.4 MG SL tablet Place 0.4 mg under the tongue every 5 (five) minutes as needed.    . potassium chloride SA (K-DUR) 20 MEQ tablet Take 1 tablet 2 x /day  . pyridOXINE (VITAMIN B-6) 50 MG tablet Take 50 mg by mouth daily.    . risperiDONE (RISPERDAL) 2 MG tablet Take 1 mg by mouth at bedtime.   . rosuvastatin (CRESTOR) 40 MG tablet Take 1 tablet (40 mg total) by mouth daily.  . Tamsulosin HCl (FLOMAX) 0.4 MG CAPS Take 0.4 mg by mouth daily.    Marland Kitchen zolpidem (AMBIEN) 10 MG tablet TAKE 1 TABLET(10 MG) BY MOUTH AT BEDTIME AS NEEDED   No current facility-administered medications on file prior to visit.    No Known Allergies  PMHx:   Past Medical History:  Diagnosis Date  . 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 Arthritis   . Axillary abscess    left  . CAD (coronary artery disease)   . Diverticulitis, colon   . History of colonic polyps   . HTN (hypertension)   . Hyperglycemia    diet controlled  . Hyperlipidemia   . Radiation proctitis    with chronic bleeding  Immunization History  Administered Date(s) Administered  . Influenza, High Dose Seasonal PF 09/08/2018  . Influenza-Unspecified 08/22/2014, 09/03/2015, 08/25/2016  . Pneumococcal Conjugate-13 09/03/2015  . Pneumococcal-Unspecified 08/25/2016  . Td 11/22/2006   Past Surgical History:  Procedure Laterality Date  . COLONOSCOPY    . COLONOSCOPY W/ POLYPECTOMY    . CORONARY ANGIOPLASTY WITH STENT PLACEMENT  04/2008  . MASS EXCISION Left 10/06/2017   Procedure: EXCISION LEFT AXILLA CYST;  Surgeon: Erroll Luna, MD;  Location: [facility];  Service: General;  Laterality: Left;   FHx:    Reviewed / unchanged  SHx:    Reviewed / unchanged   Systems Review:  Constitutional: Denies fever, chills, wt changes, headaches, insomnia, fatigue, night sweats, change in appetite.  Eyes: Denies redness, blurred vision, diplopia, discharge, itchy, watery eyes.  ENT: Denies discharge, congestion, post nasal drip, epistaxis, sore throat, earache, hearing loss, dental pain, tinnitus, vertigo, sinus pain, snoring.  CV: Denies chest pain, palpitations, irregular heartbeat, syncope, dyspnea, diaphoresis, orthopnea, PND, claudication or edema. Respiratory: denies cough, dyspnea, DOE, pleurisy, hoarseness, laryngitis, wheezing.  Gastrointestinal: Denies dysphagia, odynophagia, heartburn, reflux, water brash, abdominal pain or cramps, nausea, vomiting, bloating, diarrhea, constipation, hematemesis, melena, hematochezia  or hemorrhoids. Genitourinary: Denies dysuria, frequency, urgency, nocturia, hesitancy, discharge, hematuria or flank pain. Musculoskeletal: Denies arthralgias, myalgias, stiffness, jt. swelling, pain, limping or strain/sprain.  Skin: Denies pruritus, rash, hives, warts, acne, eczema or change in skin lesion(s). Neuro: No weakness, tremor, incoordination, spasms, paresthesia or pain. Psychiatric: Denies confusion, memory loss or sensory loss. Endo: Denies change in weight, skin or hair change.  Heme/Lymph: No excessive bleeding, bruising or enlarged lymph nodes.  Physical Exam  BP 120/76   Pulse (!) 56   Temp (!) 97.1 F (36.2 C)   Resp 16   Ht 5\' 9"  (1.753 m)   Wt 208 lb 12.8 oz (94.7 kg)   BMI 30.83 kg/m   Appears  well nourished, well groomed  and in no distress.  Eyes: PERRLA, EOMs, conjunctiva no swelling or erythema. Sinuses: No frontal/maxillary tenderness ENT/Mouth: EAC's clear, TM's nl w/o erythema, bulging. Nares clear w/o erythema, swelling, exudates. Oropharynx clear without erythema or exudates. Oral hygiene is good. Tongue normal, non obstructing. Hearing intact.  Neck: Supple. Thyroid not palpable. Car 2+/2+ without bruits, nodes or JVD. Chest: Respirations nl with BS clear & equal w/o rales, rhonchi, wheezing or stridor.  Cor: Heart sounds  normal w/ regular rate and rhythm without sig. murmurs, gallops, clicks or rubs. Peripheral pulses normal and equal  without edema.  Abdomen: Soft & bowel sounds normal. Non-tender w/o guarding, rebound, hernias, masses or organomegaly.  Lymphatics: Unremarkable.  Musculoskeletal: Full ROM all peripheral extremities, joint stability, 5/5 strength and normal gait.  Skin: Warm, dry without exposed rashes, lesions or ecchymosis apparent.  Neuro: Cranial nerves intact, reflexes equal bilaterally. Sensory-motor testing grossly intact. Tendon reflexes grossly intact.  Pysch: Alert & oriented x 3.  Insight and judgement nl & appropriate. No ideations.  Assessment and Plan:  1. Essential hypertension  - Continue medication, monitor blood pressure at home.  - Continue DASH diet.  Reminder to go to the ER if any CP,  SOB, nausea, dizziness, severe HA, changes vision/speech.  - CBC with Differential/Platelet - COMPLETE METABOLIC PANEL WITH GFR - Magnesium - TSH  2. Hyperlipidemia associated with type 2 diabetes mellitus ([place])  - Continue diet/meds, exercise,& lifestyle modifications.  - Continue monitor periodic cholesterol/liver & renal functions   - Lipid panel - TSH  3. Type 2 diabetes mellitus with stage 2 chronic kidney disease, without long-term current use of insulin (HCC)  - Continue diet, exercise  - Lifestyle modifications.  - Monitor appropriate labs.  - Hemoglobin A1c - Insulin, random  4. Vitamin D deficiency  - Continue supplementation.  - VITAMIN D 25 Hydroxyl  5. Atherosclerosis of native coronary artery without angina pectoris  - Lipid panel  6. Medication management  - CBC with Differential/Platelet - COMPLETE METABOLIC PANEL WITH GFR - Magnesium - Lipid panel - TSH - Hemoglobin A1c - Insulin, random - VITAMIN D 25 Hydroxyl         Discussed  regular exercise, BP monitoring, weight control to achieve/maintain BMI less than 25 and discussed med and  SE's. Recommended labs to assess and monitor clinical status with further disposition pending results of labs.  I discussed the assessment and treatment plan with the patient. The patient was provided an opportunity to ask questions and all were answered. The patient agreed with the plan and demonstrated an understanding of the instructions.  I provided over 30 minutes of exam, counseling, chart review and  complex critical decision making.  Kirtland Bouchard, MD

## 2019-08-07 NOTE — Patient Instructions (Signed)

## 2019-08-08 LAB — CBC WITH DIFFERENTIAL/PLATELET
Absolute Monocytes: 351 cells/uL (ref 200–950)
Basophils Absolute: 18 cells/uL (ref 0–200)
Basophils Relative: 0.4 %
Eosinophils Absolute: 108 cells/uL (ref 15–500)
Eosinophils Relative: 2.4 %
HCT: 48.2 % (ref 38.5–50.0)
Hemoglobin: 15.4 g/dL (ref 13.2–17.1)
Lymphs Abs: 1616 cells/uL (ref 850–3900)
MCH: 25.8 pg — ABNORMAL LOW (ref 27.0–33.0)
MCHC: 32 g/dL (ref 32.0–36.0)
MCV: 80.6 fL (ref 80.0–100.0)
MPV: 11.3 fL (ref 7.5–12.5)
Monocytes Relative: 7.8 %
Neutro Abs: 2408 cells/uL (ref 1500–7800)
Neutrophils Relative %: 53.5 %
Platelets: 185 10*3/uL (ref 140–400)
RBC: 5.98 10*6/uL — ABNORMAL HIGH (ref 4.20–5.80)
RDW: 14.7 % (ref 11.0–15.0)
Total Lymphocyte: 35.9 %
WBC: 4.5 10*3/uL (ref 3.8–10.8)

## 2019-08-08 LAB — COMPLETE METABOLIC PANEL WITH GFR
AG Ratio: 1.7 (calc) (ref 1.0–2.5)
ALT: 24 U/L (ref 9–46)
AST: 30 U/L (ref 10–35)
Albumin: 4.5 g/dL (ref 3.6–5.1)
Alkaline phosphatase (APISO): 50 U/L (ref 35–144)
BUN/Creatinine Ratio: 13 (calc) (ref 6–22)
BUN: 16 mg/dL (ref 7–25)
CO2: 26 mmol/L (ref 20–32)
Calcium: 10.2 mg/dL (ref 8.6–10.3)
Chloride: 102 mmol/L (ref 98–110)
Creat: 1.28 mg/dL — ABNORMAL HIGH (ref 0.70–1.18)
GFR, Est African American: 62 mL/min/{1.73_m2} (ref >=60)
GFR, Est Non African American: 53 mL/min/{1.73_m2} — ABNORMAL LOW (ref >=60)
Globulin: 2.7 g/dL (calc) (ref 1.9–3.7)
Glucose, Bld: 180 mg/dL — ABNORMAL HIGH (ref 65–99)
Potassium: 3.5 mmol/L (ref 3.5–5.3)
Sodium: 141 mmol/L (ref 135–146)
Total Bilirubin: 0.6 mg/dL (ref 0.2–1.2)
Total Protein: 7.2 g/dL (ref 6.1–8.1)

## 2019-08-08 LAB — HEMOGLOBIN A1C
Hgb A1c MFr Bld: 6.3 % of total Hgb — ABNORMAL HIGH (ref <5.7)
Mean Plasma Glucose: 134 (calc)
eAG (mmol/L): 7.4 (calc)

## 2019-08-08 LAB — TSH: TSH: 2.42 mIU/L (ref 0.40–4.50)

## 2019-08-08 LAB — LIPID PANEL
Cholesterol: 129 mg/dL (ref <200)
HDL: 37 mg/dL — ABNORMAL LOW (ref >=40)
LDL Cholesterol (Calc): 72 mg/dL (calc)
Non-HDL Cholesterol (Calc): 92 mg/dL (calc) (ref <130)
Total CHOL/HDL Ratio: 3.5 (calc) (ref <5.0)
Triglycerides: 110 mg/dL (ref <150)

## 2019-08-08 LAB — MAGNESIUM: Magnesium: 2.1 mg/dL (ref 1.5–2.5)

## 2019-08-08 LAB — INSULIN, RANDOM: Insulin: 144.1 u[IU]/mL — ABNORMAL HIGH

## 2019-08-08 LAB — VITAMIN D 25 HYDROXY (VIT D DEFICIENCY, FRACTURES): Vit D, 25-Hydroxy: 80 ng/mL (ref 30–100)

## 2019-08-31 ENCOUNTER — Other Ambulatory Visit: Payer: Self-pay | Admitting: Physician Assistant

## 2019-09-04 ENCOUNTER — Other Ambulatory Visit: Payer: Self-pay | Admitting: Internal Medicine

## 2019-09-04 MED ORDER — ATENOLOL 100 MG PO TABS: ORAL_TABLET | ORAL | 3 refills | Status: DC

## 2019-10-22 ENCOUNTER — Ambulatory Visit: Payer: Self-pay | Admitting: Adult Health

## 2019-11-06 NOTE — Progress Notes (Signed)
MEDICARE ANNUAL WELLNESS VISIT AND FOLLOW UP Assessment:   Encounter for Medicare annual wellness exam  Essential hypertension - continue medications, DASH diet, exercise and monitor at home. Call if greater than 130/80.  -     furosemide (LASIX) 40 MG tablet; Take 1 tablet (40 mg total) by mouth daily. -     CBC with Differential/Platelet -     CMP/GFR -     TSH -     losartan-hydrochlorothiazide (HYZAAR) 100-25 MG tablet; Take 1 tablet by mouth daily.  Atherosclerosis of native coronary artery without angina pectoris, unspecified whether native or transplanted heart Control blood pressure, cholesterol, glucose, increase exercise.  Follow up Dr. Percival Spanish  Hyperlipidemia -continue medications, check lipids, decrease fatty foods, increase activity.  -     Lipid panel  Diet-controlled diabetes mellitus ([place]) Discussed general issues about diabetes pathophysiology and management., Educational material distributed., Suggested low cholesterol diet., Encouraged aerobic exercise., Discussed foot care., Reminded to get yearly retinal exam. -     Hemoglobin A1c  Benign neoplasm of colon, unspecified part of colon Continue follow up; UTD on colonoscopies  ADENOCARCINOMA, PROSTATE Hx of; was released in 2017 s/p brachiotherapy in 2002 Monitor PSAs at [place] Continue follow up  Diverticulosis of colon Continue follow up  Vitamin D deficiency At goal at recent check; continue to recommend supplementation for goal of 60-100 Defer vitamin D level  PTSD (post-traumatic stress disorder) Continue [facility] follow up, stable on medications  BMI 30  - long discussion about weight loss, diet, and exercise -recommended diet heavy in fruits and veggies and low in animal meats, cheeses, and dairy products  Idiopathic gout, unspecified chronicity, unspecified site Gout- recheck Uric acid as needed, Diet discussed, continue medications.  Insomnia - good sleep hygiene discussed,  increase day time activity, try melatonin or benadryl -     zolpidem (AMBIEN) 10 MG tablet; Take 1 tablet (10 mg total) by mouth at bedtime.  Arthritis of both knees Managed by [place]; Doing PT Trying to postpone surgery   Murmur Early systolic, mild, non-radiating No sx associated; reassured patient likely benign; can discuss with Dr. Percival Spanish May benefit from ECHO if recommended by cardiology   Over 30 minutes of exam, counseling, chart review, and critical decision making was performed  Future Appointments  Date Time Provider [place]  02/26/2020 10:00 AM Unk Pinto, MD [facility] None    Plan:   During the course of the visit the patient was educated and counseled about appropriate screening and preventive services including:    Pneumococcal vaccine   Influenza vaccine  Prevnar 13  Td vaccine  Screening electrocardiogram  Colorectal cancer screening  Diabetes screening  Glaucoma screening  Nutrition counseling    Subjective:  [name] Colon is a 78 y.o. male who presents for Medicare Annual Wellness Visit and 3 month follow up for HTN, hyperlipidemia, diet controlled T2DM, and vitamin D Def.   Has bilateral OA knees. Has mobic that helps some, following with the [place]. Currently doing PT. Has brace/cane but not using.   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    He has insomnia treated by Lorrin Mais and reports he sleeps well with this without notable side effects. He works with [facility] for PTSD, stable on risperidone.   BMI is Body mass index is 30.57 kg/m., he has been  working on diet and exercise, knees limit exercise, doing 20-30 min of exercise most days, working with PT, cut down on potatoes.  Wt Readings from Last 3 Encounters:  11/07/19 207 lb (93.9 kg)  08/07/19 208 lb 12.8 oz (94.7 kg)   06/13/19 206 lb 4.8 oz (93.6 kg)   He is s/p stent in 2009, had normal stress test 02/2016 with Dr. Percival Spanish. Is prescribed nitroglycerine but has never needed.  His blood pressure has been controlled at home, today their BP is BP: 120/68  He does not workout. He denies chest pain, shortness of breath, dizziness.   He is on cholesterol medication (simvastatin 20 mg daily) and denies myalgias. His cholesterol is at goal. The cholesterol last visit was:   Lab Results  Component Value Date   CHOL 129 08/07/2019   HDL 37 (L) 08/07/2019   LDLCALC 72 08/07/2019   TRIG 110 08/07/2019   CHOLHDL 3.5 08/07/2019   He has been working on diet and exercise for Diabetes with diabetic chronic kidney disease, has been in and out of DM range x 2003, currently with diet and exercise he is in the preDM range, he is on bASA, he is on ACE/ARB, and denies paresthesia of the feet, polydipsia, polyuria and visual disturbances. Last A1C was:  Lab Results  Component Value Date   HGBA1C 6.3 (H) 08/07/2019   Patient is on Vitamin D supplement.   Lab Results  Component Value Date   VD25OH 37 08/07/2019     He has stable CKD II associated with T2DM monitored via this office:  Lab Results  Component Value Date   GFRAA 62 08/07/2019   Patient is on allopurinol for gout and does not report a recent flare.  Lab Results  Component Value Date   LABURIC 3.8 (L) 01/29/2019    Medication Review: Current Outpatient Medications on File Prior to Visit  Medication Sig Dispense Refill  . allopurinol (ZYLOPRIM) 300 MG tablet Take 300 mg by mouth daily.    Marland Kitchen amLODipine (NORVASC) 5 MG tablet Take 1 tablet (5 mg total) by mouth daily. 90 tablet 3  . aspirin 81 MG tablet Take 81 mg by mouth daily.    Marland Kitchen atenolol (TENORMIN) 100 MG tablet Take 1 tablet Daily for BP 90 tablet 3  . escitalopram (LEXAPRO) 20 MG tablet Take 20 mg by mouth daily.    Marland Kitchen losartan-hydrochlorothiazide (HYZAAR) 100-25 MG tablet TAKE 1 TABLET BY  MOUTH DAILY 90 tablet 3  . nitroGLYCERIN (NITROSTAT) 0.4 MG SL tablet Place 0.4 mg under the tongue every 5 (five) minutes as needed.      . potassium chloride SA (K-DUR) 20 MEQ tablet Take 1 tablet 2 x /day 180 tablet 3  . pyridOXINE (VITAMIN B-6) 50 MG tablet Take 50 mg by mouth daily.      . risperiDONE (RISPERDAL) 2 MG tablet Take 1 mg by mouth at bedtime.     . rosuvastatin (CRESTOR) 40 MG tablet Take 1 tablet (40 mg total) by mouth daily. 30 tablet 12  . Tamsulosin HCl (FLOMAX) 0.4 MG CAPS Take 0.4 mg by mouth daily.      Marland Kitchen zolpidem (AMBIEN) 10 MG tablet TAKE 1 TABLET(10 MG) BY MOUTH AT BEDTIME AS NEEDED 30 tablet 0   No current facility-administered medications on file prior to visit.    Allergies: No Known Allergies  Current Problems (verified) has ADENOCARCINOMA, PROSTATE; COLONIC POLYPS; Hyperlipidemia associated with type 2 diabetes mellitus ([place]); Essential hypertension; Coronary atherosclerosis; RADIATION PROCTITIS;  Diverticulosis of colon; Diet-controlled diabetes mellitus ([place]); Medication management; Vitamin D deficiency; PTSD (post-traumatic stress disorder); Obesity (BMI 30.0-34.9); Gout; CKD stage 2 due to type 2 diabetes mellitus ([place]); and Insomnia on their problem list.  Screening Tests Immunization History  Administered Date(s) Administered  . Influenza, High Dose Seasonal PF 09/08/2018, 07/24/2019  . Influenza-Unspecified 08/22/2014, 09/03/2015, 08/25/2016  . Pneumococcal Conjugate-13 09/03/2015  . Pneumococcal-Unspecified 08/25/2016  . Td 11/22/2006   Preventative care: Last colonoscopy: 02/22/2018 due 2022 CXR 2018 Ct head 2013  Prior vaccinations: TD or Tdap: 2008 declines Influenza: 07/2019 Pneumococcal: 2017 Prevnar13: 2016 Shingles/Zostavax: declines  Names of Other Physician/Practitioners you currently use: 1. [facility] here for primary care [facility]. [facility], last visit  June 2018- needs to schedule,  reports requested 3. Dr. ?, unknown name, dentist, last visit 2019, q40m  Patient Care Team: Unk Pinto, MD as PCP - General (Internal Medicine) Minus Breeding, MD as PCP - Cardiology (Cardiology) Minus Breeding, MD as Consulting Physician (Cardiology) Irene Shipper, MD as Consulting Physician (Gastroenterology) Kathie Rhodes, MD as Consulting Physician (Urology)  Surgical: He  has a past surgical history that includes Coronary angioplasty with stent (04/2008); Colonoscopy w/ polypectomy; Mass excision (Left, 10/06/2017); and Colonoscopy. Family His family history includes Heart disease in his father; Hypertension in his father; Hypotension in his mother; Kidney disease in his brother. Social history  He reports that he quit smoking about 26 years ago. He has never used smokeless tobacco. He reports that he does not drink alcohol or use drugs.  MEDICARE WELLNESS OBJECTIVES: Physical activity: Current Exercise Habits: Structured exercise class, Type of exercise: Other - see comments;strength training/weights(working with PT), Time (Minutes): 40, Frequency (Times/Week): 7, Weekly Exercise (Minutes/Week): 280, Intensity: Mild, Exercise limited by: orthopedic condition(s) Cardiac risk factors: Cardiac Risk Factors include: advanced age (>51men, >83 women);male gender;hypertension;dyslipidemia;diabetes mellitus;smoking/ tobacco exposure;obesity (BMI >30kg/m2) Depression/mood screen:   Depression screen [facility] 2/9 11/07/2019  Decreased Interest 0  Down, Depressed, Hopeless 1  PHQ - 2 Score 1    ADLs:  In your present state of health, do you have any difficulty performing the following activities: 11/07/2019 08/07/2019  Hearing? Y N  Comment has hearing aids, not wearing needs to follow up [place]? N N  Difficulty concentrating or making decisions? N N  Walking or climbing stairs? N N  Dressing or bathing? N N  Doing errands, shopping? N N  Some recent data might be hidden      Cognitive Testing  Alert? Yes  Normal Appearance?Yes  Oriented to person? Yes  Place? Yes   Time? Yes  Recall of three objects?  Yes  Can perform simple calculations? Yes  Displays appropriate judgment?Yes  Can read the correct time from a watch face?Yes  EOL planning: Does Patient Have a Medical Advance Directive?: No Would patient like information on creating a medical advance directive?: No - Patient declined   Objective:   Today's Vitals   11/07/19 1113  BP: 120/68  Pulse: (!) 51  Temp: (!) 97.3 F (36.3 C)  SpO2: 97%  Weight: 207 lb (93.9 kg)  Height: 5\' 9"  (1.753 m)   Body mass index is 30.57 kg/m.  General appearance: alert, no distress, WD/WN, male HEENT: normocephalic, sclerae anicteric, TMs pearly, nares patent, no discharge or erythema, pharynx normal. Mildly HOH.  Oral cavity: MMM, no lesions Neck: supple, no lymphadenopathy, no thyromegaly, no masses Heart: RRR, normal S1, S2, Mild 2/6 systolic murmur without  radiation best heard R 2nd ICS Lungs: CTA bilaterally, + worse RLL but bilateral wheezes, but no rhonchi, or rales Abdomen: +bs, obese,  soft, non tender, non distended, no masses, no hepatomegaly, no splenomegaly Musculoskeletal: nontender, no swelling, no obvious deformity, slow antalgic gait Extremities: 1+ edema, no cyanosis, no clubbing Pulses: 2+ symmetric, upper and lower extremities, normal cap refill Neurological: alert, oriented x 3, CN2-12 intact, strength normal upper extremities and lower extremities, sensation normal throughout, DTRs 2+ throughout, no cerebellar signs, gait slow antalgic Psychiatric: normal affect, behavior normal, pleasant   Medicare Attestation I have personally reviewed: The patient's medical and social history Their use of alcohol, tobacco or illicit drugs Their current medications and supplements The patient's functional ability including ADLs,fall risks, home safety risks, cognitive, and hearing and visual  impairment Diet and physical activities Evidence for depression or mood disorders  The patient's weight, height, BMI, and visual acuity have been recorded in the chart.  I have made referrals, counseling, and provided education to the patient based on review of the above and I have provided the patient with a written personalized care plan for preventive services.     Izora Ribas, NP   11/07/2019

## 2019-11-07 ENCOUNTER — Ambulatory Visit (INDEPENDENT_AMBULATORY_CARE_PROVIDER_SITE_OTHER): Payer: Medicare Other | Admitting: Adult Health

## 2019-11-07 ENCOUNTER — Other Ambulatory Visit: Payer: Self-pay

## 2019-11-07 ENCOUNTER — Encounter: Payer: Self-pay | Admitting: Adult Health

## 2019-11-07 VITALS — BP 120/68 | HR 51 | Temp 97.3°F | Ht 69.0 in | Wt 207.0 lb

## 2019-11-07 DIAGNOSIS — E1169 Type 2 diabetes mellitus with other specified complication: Secondary | ICD-10-CM

## 2019-11-07 DIAGNOSIS — E559 Vitamin D deficiency, unspecified: Secondary | ICD-10-CM

## 2019-11-07 DIAGNOSIS — E669 Obesity, unspecified: Secondary | ICD-10-CM

## 2019-11-07 DIAGNOSIS — D126 Benign neoplasm of colon, unspecified: Secondary | ICD-10-CM

## 2019-11-07 DIAGNOSIS — Z0001 Encounter for general adult medical examination with abnormal findings: Secondary | ICD-10-CM | POA: Diagnosis not present

## 2019-11-07 DIAGNOSIS — E119 Type 2 diabetes mellitus without complications: Secondary | ICD-10-CM | POA: Diagnosis not present

## 2019-11-07 DIAGNOSIS — I10 Essential (primary) hypertension: Secondary | ICD-10-CM | POA: Diagnosis not present

## 2019-11-07 DIAGNOSIS — K5730 Diverticulosis of large intestine without perforation or abscess without bleeding: Secondary | ICD-10-CM

## 2019-11-07 DIAGNOSIS — G4700 Insomnia, unspecified: Secondary | ICD-10-CM

## 2019-11-07 DIAGNOSIS — N182 Chronic kidney disease, stage 2 (mild): Secondary | ICD-10-CM

## 2019-11-07 DIAGNOSIS — R6889 Other general symptoms and signs: Secondary | ICD-10-CM

## 2019-11-07 DIAGNOSIS — E66811 Obesity, class 1: Secondary | ICD-10-CM

## 2019-11-07 DIAGNOSIS — Z79899 Other long term (current) drug therapy: Secondary | ICD-10-CM

## 2019-11-07 DIAGNOSIS — I2510 Atherosclerotic heart disease of native coronary artery without angina pectoris: Secondary | ICD-10-CM

## 2019-11-07 DIAGNOSIS — E1122 Type 2 diabetes mellitus with diabetic chronic kidney disease: Secondary | ICD-10-CM | POA: Diagnosis not present

## 2019-11-07 DIAGNOSIS — Z0000 Encounter for general adult medical examination without abnormal findings: Secondary | ICD-10-CM

## 2019-11-07 DIAGNOSIS — R011 Cardiac murmur, unspecified: Secondary | ICD-10-CM

## 2019-11-07 DIAGNOSIS — F4310 Post-traumatic stress disorder, unspecified: Secondary | ICD-10-CM

## 2019-11-07 DIAGNOSIS — C61 Malignant neoplasm of prostate: Secondary | ICD-10-CM

## 2019-11-07 DIAGNOSIS — M1000 Idiopathic gout, unspecified site: Secondary | ICD-10-CM

## 2019-11-07 DIAGNOSIS — E785 Hyperlipidemia, unspecified: Secondary | ICD-10-CM

## 2019-11-07 DIAGNOSIS — K520 Gastroenteritis and colitis due to radiation: Secondary | ICD-10-CM

## 2019-11-07 NOTE — Patient Instructions (Addendum)
Mr. Gephart , Thank you for taking time to come for your Medicare Wellness Visit. I appreciate your ongoing commitment to your health goals. Please review the following plan we discussed and let me know if I can assist you in the future.   These are the goals we discussed: Goals    . Blood Pressure < 130/80    . Weight (lb) < 200 lb (90.7 kg)       This is a list of the screening recommended for you and due dates:  Health Maintenance  Topic Date Due  . Tetanus Vaccine  11/22/2016  . Eye exam for diabetics  04/27/2018  . Complete foot exam   01/28/2020  . Hemoglobin A1C  02/04/2020  . Colon Cancer Screening  02/22/2021  . Flu Shot  Completed  . Pneumonia vaccines  Completed     [facility] (please request a copy of the report to bring back to use)  Schedule a follow up for [facility]     I have heard a very mild murmur - it sounds likely benign, but can discuss with your heart doctor  Heart Murmur A heart murmur is an extra sound that is caused by chaotic blood flow through the valves of the heart. The murmur can be heard as a "hum" or "whoosh" sound when blood flows through the heart. There are two types of heart murmurs:  Innocent (benign) murmurs. Most people with this type of heart murmur do not have a heart problem. Many children have innocent heart murmurs. Your health care provider may suggest some basic tests to find out whether your murmur is an innocent murmur. If an innocent heart murmur is found, there is no need for further tests or treatment and no need to restrict activities or stop playing sports.  Abnormal murmurs. These types of murmurs can occur in children and adults. Abnormal murmurs may be a sign of a more serious heart condition, such as a heart defect present at birth (congenital defect) or heart valve disease. What are the causes?  The heart has four areas called chambers. Valves  separate the upper and lower chambers from each other (tricuspid valve and mitral valve) and separate the lower chambers of the heart from pathways that lead away from the heart (aortic valve and pulmonary valve). Normally, the valves open to let blood flow through or out of your heart, and then they shut to keep the blood from flowing backward. This condition is caused by heart valves that are not working properly.  In children, abnormal heart murmurs are typically caused by congenital defects.  In adults, abnormal murmurs are usually caused by heart valve problems from disease, infection, or aging. This condition may also be caused by:  Pregnancy.  Fever.  Overactive thyroid gland.  Anemia.  Exercise.  Rapid growth spurts (in children). What are the signs or symptoms? Innocent murmurs do not cause symptoms, and many people with abnormal murmurs may not have symptoms. If symptoms do develop, they may include:  Shortness of breath.  Blue coloring of the skin, especially on the fingertips.  Chest pain.  Palpitations, or feeling a fluttering or skipped heartbeat.  Fainting.  Persistent cough.  Getting tired much faster than expected.  Swelling in the abdomen, feet, or ankles. How is this diagnosed? This condition may be diagnosed during a routine physical or other exam. If your health care provider hears a murmur  with a stethoscope, he or she will listen for:  Where the murmur is located in your heart.  How long the murmur lasts (duration).  When the murmur is heard during the heartbeat.  How loud the murmur is. This may help the health care provider figure out what is causing the murmur. You may be referred to a heart specialist (cardiologist). You may also have other tests, including:  Electrocardiogram (ECG or EKG). This test measures the electrical activity of your heart.  Echocardiogram. This test uses high frequency sound waves to make pictures of your  heart.  MRI or chest X-ray.  Cardiac catheterization. This test looks at blood flow through the arteries around the heart. For children and adults who have an abnormal heart murmur and want to stay active, it is important to:  Complete testing.  Review test results.  Receive recommendations from your health care provider. If heart disease is present, it may not be safe to play or be active. How is this treated? Heart murmurs themselves do not need treatment. In some cases, a heart murmur may go away on its own. If an underlying problem or disease is causing the murmur, you may need treatment. If treatment is needed, it will depend on the type and severity of the disease or heart problem causing the murmur. Treatment may include:  Medicine.  Surgery.  Dietary and lifestyle changes. Follow these instructions at home:  Talk with your health care provider before participating in sports or other activities that require a lot of effort and energy (are strenuous).  Learn as much as possible about your condition and any related diseases. Ask your health care provider if you may be at risk for any medical emergencies.  Talk with your health care provider about what symptoms you should look out for.  It is up to you to get your test results. Ask your health care provider, or the department that is doing the test, when your results will be ready.  Keep all follow-up visits as told by your health care provider. This is important. Contact a health care provider if:  You are frequently short of breath.  You feel more tired than usual.  You are having a hard time keeping up with normal activities or fitness routines.  You have swelling in your ankles or feet.  You notice that your heart often beats irregularly.  You develop any new symptoms. Get help right away if:  You have chest pain.  You are having trouble breathing.  You feel light-headed or you pass out.  Your symptoms  suddenly get worse. These symptoms may represent a serious problem that is an emergency. Do not wait to see if the symptoms will go away. Get medical help right away. Call your local emergency services (911 in the [place] Do not drive yourself to the hospital. Summary  Normally, the heart valves open to let blood flow through or out of your heart, and then they shut to keep the blood from flowing backward.  A heart murmur is caused by heart valves that are not working properly.  You may need treatment if an underlying problem or disease is causing the heart murmur. Treatment may include medicine, surgery, or dietary and lifestyle changes.  Talk with your health care provider before participating in sports or other activities that require a lot of effort and energy (are strenuous).  Talk with your health care provider about what symptoms you should watch out for. This information is not  intended to replace advice given to you by your health care provider. Make sure you discuss any questions you have with your health care provider. Document Released: 12/16/2004 Document Revised: 05/02/2018 Document Reviewed: 05/02/2018 Elsevier Patient Education  2020 [employer].

## 2019-11-08 LAB — CBC WITH DIFFERENTIAL/PLATELET
Absolute Monocytes: 449 cells/uL (ref 200–950)
Basophils Absolute: 20 cells/uL (ref 0–200)
Basophils Relative: 0.5 %
Eosinophils Absolute: 109 cells/uL (ref 15–500)
Eosinophils Relative: 2.8 %
HCT: 49.8 % (ref 38.5–50.0)
Hemoglobin: 16 g/dL (ref 13.2–17.1)
Lymphs Abs: 1708 cells/uL (ref 850–3900)
MCH: 25.8 pg — ABNORMAL LOW (ref 27.0–33.0)
MCHC: 32.1 g/dL (ref 32.0–36.0)
MCV: 80.2 fL (ref 80.0–100.0)
MPV: 11.1 fL (ref 7.5–12.5)
Monocytes Relative: 11.5 %
Neutro Abs: 1615 cells/uL (ref 1500–7800)
Neutrophils Relative %: 41.4 %
Platelets: 183 10*3/uL (ref 140–400)
RBC: 6.21 10*6/uL — ABNORMAL HIGH (ref 4.20–5.80)
RDW: 15.7 % — ABNORMAL HIGH (ref 11.0–15.0)
Total Lymphocyte: 43.8 %
WBC: 3.9 10*3/uL (ref 3.8–10.8)

## 2019-11-08 LAB — COMPLETE METABOLIC PANEL WITH GFR
AG Ratio: 1.6 (calc) (ref 1.0–2.5)
ALT: 17 U/L (ref 9–46)
AST: 21 U/L (ref 10–35)
Albumin: 4.6 g/dL (ref 3.6–5.1)
Alkaline phosphatase (APISO): 58 U/L (ref 35–144)
BUN/Creatinine Ratio: 11 (calc) (ref 6–22)
BUN: 15 mg/dL (ref 7–25)
CO2: 29 mmol/L (ref 20–32)
Calcium: 10.9 mg/dL — ABNORMAL HIGH (ref 8.6–10.3)
Chloride: 102 mmol/L (ref 98–110)
Creat: 1.35 mg/dL — ABNORMAL HIGH (ref 0.70–1.18)
GFR, Est African American: 57 mL/min/{1.73_m2} — ABNORMAL LOW (ref >=60)
GFR, Est Non African American: 50 mL/min/{1.73_m2} — ABNORMAL LOW (ref >=60)
Globulin: 2.9 g/dL (calc) (ref 1.9–3.7)
Glucose, Bld: 137 mg/dL — ABNORMAL HIGH (ref 65–99)
Potassium: 3.7 mmol/L (ref 3.5–5.3)
Sodium: 142 mmol/L (ref 135–146)
Total Bilirubin: 0.5 mg/dL (ref 0.2–1.2)
Total Protein: 7.5 g/dL (ref 6.1–8.1)

## 2019-11-08 LAB — LIPID PANEL
Cholesterol: 121 mg/dL (ref <200)
HDL: 40 mg/dL (ref >=40)
LDL Cholesterol (Calc): 63 mg/dL (calc)
Non-HDL Cholesterol (Calc): 81 mg/dL (calc) (ref <130)
Total CHOL/HDL Ratio: 3 (calc) (ref <5.0)
Triglycerides: 101 mg/dL (ref <150)

## 2019-11-08 LAB — MAGNESIUM: Magnesium: 2.4 mg/dL (ref 1.5–2.5)

## 2019-11-08 LAB — TSH: TSH: 2.04 mIU/L (ref 0.40–4.50)

## 2019-11-08 LAB — HEMOGLOBIN A1C
Hgb A1c MFr Bld: 6.4 % of total Hgb — ABNORMAL HIGH (ref <5.7)
Mean Plasma Glucose: 137 (calc)
eAG (mmol/L): 7.6 (calc)

## 2020-02-25 ENCOUNTER — Encounter: Payer: Self-pay | Admitting: Internal Medicine

## 2020-02-25 NOTE — Progress Notes (Signed)
Annual  Screening/Preventative Visit  & Comprehensive Evaluation & Examination     This very nice 79 y.o. MBM presents for a Screening /Preventative Visit & comprehensive evaluation and management of multiple medical co-morbidities.  Patient has been followed for HTN, ASCAD/Stent, HLD, T2_NIDDM / [id number] and Vitamin D Deficiency. Patient is followed at the [facility] for PTSD.      HTN predates  circa 1990. Patient's BP has been controlled and today's BP is at goal -  120/66.  In 2009, patient has PCI/Stent of a 99% Circ stenosis and Lexiscan in 2017 was Negative. Patient denies any cardiac symptoms as chest pain, palpitations, shortness of breath, dizziness or ankle swelling.     Patient's hyperlipidemia is controlled with diet and medications. Patient denies myalgias or other medication SE's. 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 Last A1c was not at goal:  Lab Results  Component Value Date   HGBA1C 6.4 (H) 11/07/2019        Finally, patient has history of Vitamin D Deficiency ("38" / 2012) and last vitamin D was at goal:  Lab Results  Component Value Date   VD25OH 80 08/07/2019    Current Outpatient Medications on File Prior to Visit  Medication Sig  . allopurinol (ZYLOPRIM) 300 MG tablet Take 300 mg by mouth daily.  Marland Kitchen amLODipine (NORVASC) 5 MG tablet Take 1 tablet (5 mg total) by mouth daily.  Marland Kitchen aspirin 81 MG tablet Take 81 mg by mouth daily.  Marland Kitchen atenolol (TENORMIN) 100 MG tablet Take 1 tablet Daily for BP  . escitalopram (LEXAPRO) 20 MG tablet Take 20 mg by mouth daily.  Marland Kitchen losartan-hydrochlorothiazide (HYZAAR) 100-25 MG tablet TAKE 1 TABLET BY MOUTH DAILY   . nitroGLYCERIN (NITROSTAT) 0.4 MG SL tablet Place 0.4 mg under the tongue every 5 (five) minutes as needed.    . potassium chloride SA (K-DUR) 20 MEQ tablet Take 1 tablet 2 x /day  . pyridOXINE (VITAMIN B-6) 50 MG tablet Take 50 mg by mouth daily.    . risperiDONE (RISPERDAL) 2 MG tablet Take 1 mg by mouth at bedtime.   . Tamsulosin HCl (FLOMAX) 0.4 MG CAPS Take 0.4 mg by mouth daily.    Marland Kitchen zolpidem (AMBIEN) 10 MG tablet TAKE 1 TABLET(10 MG) BY MOUTH AT BEDTIME AS NEEDED  . rosuvastatin (CRESTOR) 40 MG tablet Take 1 tablet (40 mg total) by mouth daily.   No current facility-administered medications on file prior to visit.   No Known Allergies Past Medical History:  Diagnosis Date  . 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 Arthritis   . Axillary abscess    left  . CAD (coronary artery disease)   . Diverticulitis, colon   . History of colonic polyps   . HTN (hypertension)   . Hyperglycemia    diet controlled  . Hyperlipidemia   . Radiation proctitis    with chronic bleeding   Health Maintenance  Topic Date Due  . OPHTHALMOLOGY EXAM  04/27/2018  . TETANUS/TDAP  11/06/2020 (Originally 11/22/2016)  . HEMOGLOBIN A1C  05/07/2020  . INFLUENZA VACCINE  06/22/2020  .  COLONOSCOPY  02/22/2021  . FOOT EXAM  02/24/2021  . PNA vac Low Risk Adult  Completed   Immunization History  Administered Date(s) Administered  . Influenza, High Dose Seasonal PF 09/08/2018, 07/24/2019  . Influenza-Unspecified 08/22/2014, 09/03/2015, 08/25/2016  . Moderna SARS-COVID-2 Vaccination 12/06/2019, 01/03/2020  . Pneumococcal Conjugate-13 09/03/2015  . Pneumococcal-Unspecified 08/25/2016  . Td 11/22/2006   Last Colon -  02/22/2018 - Dr Henrene Pastor - Multiple Polyps - recc 3 yr f/u if medically stable - due Apr 2022  Past Surgical History:  Procedure Laterality Date  . COLONOSCOPY    . COLONOSCOPY W/ POLYPECTOMY    . CORONARY ANGIOPLASTY WITH STENT PLACEMENT  04/2008  . MASS EXCISION  Left 10/06/2017   Procedure: EXCISION LEFT AXILLA CYST;  Surgeon: Erroll Luna, MD;  Location: [place];  Service: General;  Laterality: Left;   Family History  Problem Relation Age of Onset  . Heart disease Father   . Hypertension Father   . Hypotension Mother   . Kidney disease Brother   . Colon cancer Neg Hx    Social History   Socioeconomic History  . Marital status: Married    Spouse name: Not on file  . Number of children: 2  Occupational History  . Occupation: retired    Fish farm manager: Retired  Immunologist  . Smoking status: Former Smoker    Quit date: 11/22/1992    Years since quitting: 27.2  . Smokeless tobacco: Never Used  Substance and Sexual Activity  . Alcohol use: No  . Drug use: No  . Sexual activity: Not Currently   ROS Constitutional: Denies fever, chills, weight loss/gain, headaches, insomnia,  night sweats or change in appetite. Does c/o fatigue. Eyes: Denies redness, blurred vision, diplopia, discharge, itchy or watery eyes.  ENT: Denies discharge, congestion, post nasal drip, epistaxis, sore throat, earache, hearing loss, dental pain, Tinnitus, Vertigo, Sinus pain or snoring.  Cardio: Denies chest pain, palpitations, irregular heartbeat, syncope, dyspnea, diaphoresis, orthopnea, PND, claudication or edema Respiratory: denies cough, dyspnea, DOE, pleurisy, hoarseness, laryngitis or wheezing.  Gastrointestinal: Denies dysphagia, heartburn, reflux, water brash, pain, cramps, nausea, vomiting, bloating, diarrhea, constipation, hematemesis, melena, hematochezia, jaundice or hemorrhoids Genitourinary: Denies dysuria, frequency, urgency, nocturia, hesitancy, discharge, hematuria or flank pain Musculoskeletal: Denies arthralgia, myalgia, stiffness, Jt. Swelling, pain, limp or strain/sprain. Denies Falls. Skin: Denies puritis, rash, hives, warts, acne, eczema or change in skin lesion Neuro: No weakness, tremor, incoordination, spasms, paresthesia or  pain Psychiatric: Denies confusion, memory loss or sensory loss. Denies Depression. Endocrine: Denies change in weight, skin, hair change, nocturia, and paresthesia, diabetic polys, visual blurring or hyper / hypo glycemic episodes.  Heme/Lymph: No excessive bleeding, bruising or enlarged lymph nodes.  Physical Exam  BP 120/66   Pulse (!) 52   Temp (!) 97.2 F (36.2 C)   Resp 16   Ht 5\' 9"  (1.753 m)   Wt 209 lb 3.2 oz (94.9 kg)   BMI 30.89 kg/m   General Appearance: Over nourished and well groomed and in no apparent distress.  Eyes: PERRLA, EOMs, conjunctiva no swelling or erythema, normal fundi and vessels. Sinuses: No frontal/maxillary tenderness ENT/Mouth: EACs patent / TMs  nl. Nares clear without erythema, swelling, mucoid exudates. Oral hygiene is good. No erythema, swelling, or exudate. Tongue normal, non-obstructing. Tonsils not swollen or erythematous. Hearing normal.  Neck: Supple, thyroid not palpable. No bruits, nodes or JVD. Respiratory: Respiratory effort normal.  BS equal and clear bilateral without rales, rhonci, wheezing or stridor. Cardio: Heart  sounds are normal with regular rate and rhythm and no murmurs, rubs or gallops. Peripheral pulses are normal and equal bilaterally without edema. No aortic or femoral bruits. Chest: symmetric with normal excursions and percussion.  Abdomen: Soft, with Nl bowel sounds. Nontender, no guarding, rebound, hernias, masses, or organomegaly.  Lymphatics: Non tender without lymphadenopathy.  Musculoskeletal: Full ROM all peripheral extremities, joint stability, 5/5 strength, and normal gait. Skin: Warm and dry without rashes, lesions, cyanosis, clubbing or  ecchymosis.  Neuro: Cranial nerves intact, reflexes equal bilaterally. Normal muscle tone, no cerebellar symptoms. Sensation intact.  Pysch: Alert and oriented X 3 with normal affect, insight and judgment appropriate.   Assessment and Plan  1. Essential hypertension  - EKG  12-Lead - [place], RETROPERITNL ABD,  LTD - Urinalysis, Routine w reflex microscopic - Microalbumin / creatinine urine ratio - CBC with Differential/Platelet - COMPLETE METABOLIC PANEL WITH GFR - Magnesium - TSH  2. Hyperlipidemia associated with type 2 diabetes mellitus ([place])  - EKG 12-Lead - [place], RETROPERITNL ABD,  LTD - Lipid panel - TSH  3. Diabetes mellitus due to underlying condition with stage 3a chronic  kidney disease, without long-term current use of insulin ([facility])  - EKG 12-Lead - [place], RETROPERITNL ABD,  LTD - Urinalysis, Routine w reflex microscopic - Microalbumin / creatinine urine ratio - HM DIABETES FOOT EXAM - LOW EXTREMITY NEUR EXAM DOCUM - Hemoglobin A1c - Insulin, random  4. Vitamin D deficiency  - VITAMIN D 25 Hydroxy   5. Atherosclerosis of native coronary artery without angina pectoris, unspecified whether native or transplanted heart  - EKG 12-Lead - Lipid panel  6. Idiopathic gout  - Uric acid  7. History of prostate cancer  - PSA  8. BPH with obstruction/lower urinary tract symptoms   9. Prostate cancer screening  - PSA  10. Screening for colorectal cancer  - POC Hemoccult Bld/Stl - Patient refused to do  11. Screening for ischemic heart disease  - EKG 12-Lead  12. Family history of heart disease  - EKG 12-Lead - [place], RETROPERITNL ABD,  LTD  13. Former smoker  - EKG 12-Lead - [place], RETROPERITNL ABD,  LTD  14. Screening for AAA (aortic abdominal aneurysm)  - [place], RETROPERITNL ABD,  LTD  15. Medication management  - Urinalysis, Routine w reflex microscopic - CBC with Differential/Platelet - COMPLETE METABOLIC PANEL WITH GFR - Magnesium - Lipid panel - TSH - Hemoglobin A1c - Insulin, random - VITAMIN D 25 Hydroxy        Patient was counseled in prudent diet, weight control to achieve/maintain BMI less than 25, BP monitoring, regular exercise and medications as discussed.  Discussed med effects and SE's. Routine screening  labs and tests as requested with regular follow-up as recommended. Over 40 minutes of exam, counseling, chart review and high complex critical decision making was performed   Kirtland Bouchard, MD

## 2020-02-25 NOTE — Patient Instructions (Signed)

## 2020-02-26 ENCOUNTER — Ambulatory Visit (INDEPENDENT_AMBULATORY_CARE_PROVIDER_SITE_OTHER): Payer: Medicare Other | Admitting: Internal Medicine

## 2020-02-26 ENCOUNTER — Other Ambulatory Visit: Payer: Self-pay

## 2020-02-26 VITALS — BP 120/66 | HR 52 | Temp 97.2°F | Resp 16 | Ht 69.0 in | Wt 209.2 lb

## 2020-02-26 DIAGNOSIS — N401 Enlarged prostate with lower urinary tract symptoms: Secondary | ICD-10-CM

## 2020-02-26 DIAGNOSIS — Z136 Encounter for screening for cardiovascular disorders: Secondary | ICD-10-CM

## 2020-02-26 DIAGNOSIS — N138 Other obstructive and reflux uropathy: Secondary | ICD-10-CM

## 2020-02-26 DIAGNOSIS — E1169 Type 2 diabetes mellitus with other specified complication: Secondary | ICD-10-CM

## 2020-02-26 DIAGNOSIS — M1000 Idiopathic gout, unspecified site: Secondary | ICD-10-CM

## 2020-02-26 DIAGNOSIS — Z125 Encounter for screening for malignant neoplasm of prostate: Secondary | ICD-10-CM

## 2020-02-26 DIAGNOSIS — E559 Vitamin D deficiency, unspecified: Secondary | ICD-10-CM | POA: Diagnosis not present

## 2020-02-26 DIAGNOSIS — Z87891 Personal history of nicotine dependence: Secondary | ICD-10-CM

## 2020-02-26 DIAGNOSIS — I2510 Atherosclerotic heart disease of native coronary artery without angina pectoris: Secondary | ICD-10-CM

## 2020-02-26 DIAGNOSIS — Z8249 Family history of ischemic heart disease and other diseases of the circulatory system: Secondary | ICD-10-CM

## 2020-02-26 DIAGNOSIS — Z79899 Other long term (current) drug therapy: Secondary | ICD-10-CM

## 2020-02-26 DIAGNOSIS — N1831 Chronic kidney disease, stage 3a: Secondary | ICD-10-CM

## 2020-02-26 DIAGNOSIS — E0821 Diabetes mellitus due to underlying condition with diabetic nephropathy: Secondary | ICD-10-CM | POA: Diagnosis not present

## 2020-02-26 DIAGNOSIS — E0822 Diabetes mellitus due to underlying condition with diabetic chronic kidney disease: Secondary | ICD-10-CM

## 2020-02-26 DIAGNOSIS — Z1211 Encounter for screening for malignant neoplasm of colon: Secondary | ICD-10-CM

## 2020-02-26 DIAGNOSIS — I10 Essential (primary) hypertension: Secondary | ICD-10-CM

## 2020-02-26 DIAGNOSIS — E785 Hyperlipidemia, unspecified: Secondary | ICD-10-CM

## 2020-02-26 DIAGNOSIS — Z8546 Personal history of malignant neoplasm of prostate: Secondary | ICD-10-CM

## 2020-02-26 DIAGNOSIS — Z1212 Encounter for screening for malignant neoplasm of rectum: Secondary | ICD-10-CM

## 2020-02-27 LAB — URINALYSIS, ROUTINE W REFLEX MICROSCOPIC
Bacteria, UA: NONE SEEN /HPF
Bilirubin Urine: NEGATIVE
Glucose, UA: NEGATIVE
Hgb urine dipstick: NEGATIVE
Hyaline Cast: NONE SEEN /LPF
Ketones, ur: NEGATIVE
Leukocytes,Ua: NEGATIVE
Nitrite: NEGATIVE
RBC / HPF: NONE SEEN /HPF (ref 0–2)
Specific Gravity, Urine: 1.013 (ref 1.001–1.03)
Squamous Epithelial / HPF: NONE SEEN /HPF (ref <=5)
WBC, UA: NONE SEEN /HPF (ref 0–5)
pH: 5.5 (ref 5.0–8.0)

## 2020-02-27 LAB — CBC WITH DIFFERENTIAL/PLATELET
Absolute Monocytes: 320 cells/uL (ref 200–950)
Basophils Absolute: 18 cells/uL (ref 0–200)
Basophils Relative: 0.4 %
Eosinophils Absolute: 99 cells/uL (ref 15–500)
Eosinophils Relative: 2.2 %
HCT: 49.4 % (ref 38.5–50.0)
Hemoglobin: 15.5 g/dL (ref 13.2–17.1)
Lymphs Abs: 2025 cells/uL (ref 850–3900)
MCH: 25.2 pg — ABNORMAL LOW (ref 27.0–33.0)
MCHC: 31.4 g/dL — ABNORMAL LOW (ref 32.0–36.0)
MCV: 80.5 fL (ref 80.0–100.0)
MPV: 10.6 fL (ref 7.5–12.5)
Monocytes Relative: 7.1 %
Neutro Abs: 2039 cells/uL (ref 1500–7800)
Neutrophils Relative %: 45.3 %
Platelets: 170 10*3/uL (ref 140–400)
RBC: 6.14 10*6/uL — ABNORMAL HIGH (ref 4.20–5.80)
RDW: 16.4 % — ABNORMAL HIGH (ref 11.0–15.0)
Total Lymphocyte: 45 %
WBC: 4.5 10*3/uL (ref 3.8–10.8)

## 2020-02-27 LAB — COMPLETE METABOLIC PANEL WITH GFR
AG Ratio: 1.8 (calc) (ref 1.0–2.5)
ALT: 21 U/L (ref 9–46)
AST: 25 U/L (ref 10–35)
Albumin: 4.6 g/dL (ref 3.6–5.1)
Alkaline phosphatase (APISO): 46 U/L (ref 35–144)
BUN/Creatinine Ratio: 12 (calc) (ref 6–22)
BUN: 17 mg/dL (ref 7–25)
CO2: 30 mmol/L (ref 20–32)
Calcium: 11 mg/dL — ABNORMAL HIGH (ref 8.6–10.3)
Chloride: 102 mmol/L (ref 98–110)
Creat: 1.38 mg/dL — ABNORMAL HIGH (ref 0.70–1.18)
GFR, Est African American: 56 mL/min/{1.73_m2} — ABNORMAL LOW (ref >=60)
GFR, Est Non African American: 48 mL/min/{1.73_m2} — ABNORMAL LOW (ref >=60)
Globulin: 2.6 g/dL (calc) (ref 1.9–3.7)
Glucose, Bld: 163 mg/dL — ABNORMAL HIGH (ref 65–99)
Potassium: 3.9 mmol/L (ref 3.5–5.3)
Sodium: 141 mmol/L (ref 135–146)
Total Bilirubin: 0.5 mg/dL (ref 0.2–1.2)
Total Protein: 7.2 g/dL (ref 6.1–8.1)

## 2020-02-27 LAB — LIPID PANEL
Cholesterol: 123 mg/dL (ref <200)
HDL: 39 mg/dL — ABNORMAL LOW (ref >=40)
LDL Cholesterol (Calc): 66 mg/dL (calc)
Non-HDL Cholesterol (Calc): 84 mg/dL (calc) (ref <130)
Total CHOL/HDL Ratio: 3.2 (calc) (ref <5.0)
Triglycerides: 97 mg/dL (ref <150)

## 2020-02-27 LAB — VITAMIN D 25 HYDROXY (VIT D DEFICIENCY, FRACTURES): Vit D, 25-Hydroxy: 102 ng/mL — ABNORMAL HIGH (ref 30–100)

## 2020-02-27 LAB — URIC ACID: Uric Acid, Serum: 2.6 mg/dL — ABNORMAL LOW (ref 4.0–8.0)

## 2020-02-27 LAB — HEMOGLOBIN A1C
Hgb A1c MFr Bld: 6.4 % of total Hgb — ABNORMAL HIGH (ref <5.7)
Mean Plasma Glucose: 137 (calc)
eAG (mmol/L): 7.6 (calc)

## 2020-02-27 LAB — PSA: PSA: <0.1 ng/mL (ref <=4.0)

## 2020-02-27 LAB — TSH: TSH: 2.23 mIU/L (ref 0.40–4.50)

## 2020-02-27 LAB — INSULIN, RANDOM: Insulin: 150.8 u[IU]/mL — ABNORMAL HIGH

## 2020-02-27 LAB — MICROALBUMIN / CREATININE URINE RATIO
Creatinine, Urine: 64 mg/dL (ref 20–320)
Microalb Creat Ratio: 206 mcg/mg creat — ABNORMAL HIGH (ref <30)
Microalb, Ur: 13.2 mg/dL

## 2020-02-27 LAB — MAGNESIUM: Magnesium: 2.3 mg/dL (ref 1.5–2.5)

## 2020-02-28 ENCOUNTER — Encounter: Payer: Self-pay | Admitting: *Deleted

## 2020-03-14 ENCOUNTER — Other Ambulatory Visit: Payer: Self-pay | Admitting: Adult Health

## 2020-03-22 ENCOUNTER — Other Ambulatory Visit: Payer: Self-pay | Admitting: Adult Health

## 2020-03-22 DIAGNOSIS — I10 Essential (primary) hypertension: Secondary | ICD-10-CM

## 2020-05-31 NOTE — Progress Notes (Signed)
Virtual Visit via Telephone Note   This visit type was conducted due to national recommendations for restrictions regarding the COVID-19 Pandemic (e.g. social distancing) in an effort to limit this patient's exposure and mitigate transmission in our community.  Due to his co-morbid illnesses, this patient is at least at moderate risk for complications without adequate follow up.  This format is felt to be most appropriate for this patient at this time.  The patient did not have access to video technology/had technical difficulties with video requiring transitioning to audio format only (telephone).  All issues noted in this document were discussed and addressed.  No physical exam could be performed with this format.  Please refer to the patient's chart for his  consent to telehealth for [facility].   Date:  06/02/2020   ID:  [name] Colon, DOB 1940/11/27, MRN [id number]  Patient Location: Home Provider Location: Office/Clinic  PCP:  Unk Pinto, MD  Cardiologist:  Minus Breeding, MD  Electrophysiologist:  None   Evaluation Performed:  Follow-Up Visit  Chief Complaint: Follow Up  History of Present Illness:    [name] Colon is a 79 y.o. male we are following for ongoing assessment and management of CAD.  History of PCI and stenting of the circumflex in 2009 due to 99% distal circumflex stenosis.  He had a repeat Lexiscan perfusion study in 2017 which was negative.  Other history of hypertension, hyperglycemia (diet-controlled), hyperlipidemia with goal of LDL less than 70.  He was last seen by Dr. Percival Spanish on 06/13/2019 and was asymptomatic.  He was continued on secondary risk reduction.  Crestor was increased to 40 mg daily as his LDL earlier in the year was 90 and not yet on target.  Blood pressure was well controlled.  On conversation via telephone visit the patient has no complaints.  He has had recent labs completed by Dr. Yevonne Aline  in April 2021.  LDL was 66.   Creatinine 1.11.  He was not found to be anemic.  He denies any chest pain near syncope dyspnea on exertion or worsening fatigue.  He is medically compliant and receives his medications via the [place] which are mailed to him every 6 months.  He remains active.  The patient does not have symptoms concerning for COVID-19 infection (fever, chills, cough, or new shortness of breath).  He received his COVID vaccines in January and February 2021.   Past Medical History:  Diagnosis Date  . 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 Arthritis   . Axillary abscess    left  . CAD (coronary artery disease)   . Diverticulitis, colon   . History of colonic polyps   . HTN (hypertension)   . Hyperglycemia    diet controlled  . Hyperlipidemia   . Radiation proctitis    with chronic bleeding   Past Surgical History:  Procedure Laterality Date  . COLONOSCOPY    . COLONOSCOPY W/ POLYPECTOMY    . CORONARY ANGIOPLASTY WITH STENT PLACEMENT  04/2008  . MASS EXCISION Left 10/06/2017   Procedure: EXCISION LEFT AXILLA CYST;  Surgeon: Erroll Luna, MD;  Location: [facility];  Service: General;  Laterality: Left;     Current Meds  Medication Sig  . allopurinol (ZYLOPRIM) 300 MG tablet Take 300 mg by mouth daily.  Marland Kitchen amLODipine (NORVASC) 5 MG tablet Take 1 tablet (5 mg total) by mouth daily.  Marland Kitchen aspirin 81 MG tablet Take 81 mg by mouth  daily.  . atenolol (TENORMIN) 100 MG tablet Take 1 tablet Daily for BP  . escitalopram (LEXAPRO) 20 MG tablet Take 20 mg by mouth daily.  Marland Kitchen losartan-hydrochlorothiazide (HYZAAR) 100-25 MG tablet Take 1 tablet Daily for BP  . nitroGLYCERIN (NITROSTAT) 0.4 MG SL tablet Place 0.4 mg under the tongue every 5 (five) minutes as needed.    . potassium chloride SA (K-DUR) 20 MEQ tablet Take 1 tablet 2 x /day  . propranolol (INDERAL) 10 MG tablet Take 10 mg by mouth as needed.  . pyridOXINE (VITAMIN B-6) 50 MG tablet Take 50 mg by mouth daily.     . risperiDONE (RISPERDAL) 2 MG tablet Take 1 mg by mouth at bedtime.   . rosuvastatin (CRESTOR) 40 MG tablet Take 1 tablet (40 mg total) by mouth daily.  . Tamsulosin HCl (FLOMAX) 0.4 MG CAPS Take 0.4 mg by mouth daily.    Marland Kitchen zolpidem (AMBIEN) 10 MG tablet TAKE 1 TABLET(10 MG) BY MOUTH AT BEDTIME AS NEEDED     Allergies:   Patient has no known allergies.   Social History   Tobacco Use  . Smoking status: Former Smoker    Quit date: 11/22/1992    Years since quitting: 27.5  . Smokeless tobacco: Never Used  Substance Use Topics  . Alcohol use: No  . Drug use: No     Family Hx: The patient's family history includes Heart disease in his father; Hypertension in his father; Hypotension in his mother; Kidney disease in his brother. There is no history of Colon cancer.  ROS:   Please see the history of present illness.    All other systems reviewed and are negative.   Prior CV studies:   The following studies were reviewed today: 03/11/2019 [facility] Stress Test Study Highlights   Nuclear stress EF is calculated at 48% but appears normal at 55%. No regional wall motion abnormalities noted.  There was no ST segment deviation noted during stress.  There is a medium defect of mild severity present in the basal inferior, mid inferior and apical inferior location. The defect is non-reversible. This is consistent with diaphragmatic attenuation artifact and extracardiac activity. No ischemia noted.  The left ventricular ejection fraction is mildly decreased (45-54%).  This is a low risk study.   Labs/Other Tests and Data Reviewed:    EKG:  No ECG reviewed.  Recent Labs: 02/26/2020: ALT 21; BUN 17; Creat 1.38; Hemoglobin 15.5; Magnesium 2.3; Platelets 170; Potassium 3.9; Sodium 141; TSH 2.23   Recent Lipid Panel Lab Results  Component Value Date/Time   CHOL 123 02/26/2020 10:00 AM   CHOL 134 06/19/2019 10:37 AM   TRIG 97 02/26/2020 10:00 AM   HDL 39 (L) 02/26/2020 10:00 AM   HDL  42 06/19/2019 10:37 AM   CHOLHDL 3.2 02/26/2020 10:00 AM   LDLCALC 66 02/26/2020 10:00 AM    Wt Readings from Last 3 Encounters:  06/02/20 190 lb (86.2 kg)  02/26/20 209 lb 3.2 oz (94.9 kg)  11/07/19 207 lb (93.9 kg)     Objective:    Vital Signs:  BP 124/74   Pulse 72   Ht 5\' 9"  (1.753 m)   Wt 190 lb (86.2 kg)   BMI 28.06 kg/m  Limited due to telephone visit.  VITAL SIGNS:  reviewed GEN:  no acute distress RESPIRATORY:  normal respiratory effort, symmetric expansion NEURO:  alert and oriented x 3, no obvious focal deficit PSYCH:  normal affect  ASSESSMENT & PLAN:  1.  Coronary artery disease: History of PCI and stenting of the circumflex in 2009, he is medically compliant and is without any complaints of chest discomfort or fatigue or dyspnea on exertion.  He has had recent labs by his primary care and receives his medicines from the [place]. he is no longer on antiplatelet therapy, but does continue on aspirin 81 mg daily.  2.  Hyperlipidemia: Crestor had been increased to 40 mg daily.  Review of recent labs in April 2021 revealed an LDL of 66 and a total cholesterol of 126.  He offers no complaints of myalgia pain.  Continue current medication regimen, continuing to keep LDL less than 70.  3.  Hypertension: Well-controlled today.  Continue losartan HCTZ, amlodipine, and atenolol.  He states that his blood pressure runs about the same as it is today.  When seen by PCP blood pressure was well controlled.  No plan changes or labs at this time.    COVID-19 Education: The signs and symptoms of COVID-19 were discussed with the patient and how to seek care for testing (follow up with PCP or arrange E-visit).  The importance of social distancing was discussed today.  Time:   Today, I have spent 20 minutes with the patient with telehealth technology discussing the above problems.     Medication Adjustments/Labs and Tests Ordered: Current medicines are reviewed at length with the  patient today.  Concerns regarding medicines are outlined above.   Tests Ordered: No orders of the defined types were placed in this encounter.   Medication Changes: No orders of the defined types were placed in this encounter.   Disposition:  Follow up 1 year unless symptomatic   Signed, Phill Myron. West Pugh, ANP, AACC  06/02/2020 8:51 AM    [facility]

## 2020-06-01 ENCOUNTER — Other Ambulatory Visit: Payer: Self-pay | Admitting: Internal Medicine

## 2020-06-01 DIAGNOSIS — I10 Essential (primary) hypertension: Secondary | ICD-10-CM

## 2020-06-01 MED ORDER — LOSARTAN POTASSIUM-HCTZ 100-25 MG PO TABS: ORAL_TABLET | ORAL | 0 refills | Status: DC

## 2020-06-02 ENCOUNTER — Telehealth (INDEPENDENT_AMBULATORY_CARE_PROVIDER_SITE_OTHER): Payer: Medicare Other | Admitting: Adult Health

## 2020-06-02 ENCOUNTER — Encounter: Payer: Self-pay | Admitting: Adult Health

## 2020-06-02 VITALS — BP 124/74 | HR 72 | Ht 69.0 in | Wt 190.0 lb

## 2020-06-02 DIAGNOSIS — I10 Essential (primary) hypertension: Secondary | ICD-10-CM

## 2020-06-02 DIAGNOSIS — I2510 Atherosclerotic heart disease of native coronary artery without angina pectoris: Secondary | ICD-10-CM | POA: Diagnosis not present

## 2020-06-02 DIAGNOSIS — E1129 Type 2 diabetes mellitus with other diabetic kidney complication: Secondary | ICD-10-CM | POA: Insufficient documentation

## 2020-06-02 DIAGNOSIS — E785 Hyperlipidemia, unspecified: Secondary | ICD-10-CM

## 2020-06-02 NOTE — Patient Instructions (Signed)
Medication Instructions:  Continue current mediations  *If you need a refill on your cardiac medications before your next appointment, please call your pharmacy*   Lab Work: None Ordered   Testing/Procedures: None Ordered   Follow-Up: At [facility], you and your health needs are our priority.  As part of our continuing mission to provide you with exceptional heart care, we have created designated Provider Care Teams.  These Care Teams include your primary Cardiologist (physician) and Advanced Practice Providers (APPs -  Physician Assistants and Nurse Practitioners) who all work together to provide you with the care you need, when you need it.  We recommend signing up for the patient portal called "MyChart".  Sign up information is provided on this After Visit Summary.  MyChart is used to connect with patients for Virtual Visits (Telemedicine).  Patients are able to view lab/test results, encounter notes, upcoming appointments, etc.  Non-urgent messages can be sent to your provider as well.   To learn more about what you can do with MyChart, go to [email or web address].    Your next appointment:   1 year(s)  The format for your next appointment:   In Person  Provider:   You may see Minus Breeding, MD or one of the following Advanced Practice Providers on your designated Care Team:    Rosaria Ferries, PA-C  Jory Sims, DNP, ANP  Cadence Kathlen Mody, [place]

## 2020-06-02 NOTE — Progress Notes (Signed)
FOLLOW UP  Assessment and Plan:   Hypertension At goal; continue with current medications Monitor blood pressure at home; patient to call if consistently greater than 140/80 Continue DASH diet.   Reminder to go to the ER if any CP, SOB, nausea, dizziness, severe HA, changes vision/speech, left arm numbness and tingling and jaw pain.  Hyperlipidemia associated with T2DM ([place]) He is at diabetic goal of <70 with rosuvastatin 40 mg daily Continue low cholesterol diet and exercise.  Check lipid panel.   Diabetes with diabetic chronic kidney disease ([place]) Currently diet controlled Continue diet and exercise.  Perform daily foot/skin check, notify office of any concerning changes.  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 (H[facility]) On ARB, some decline over the last 6 months, discussed fluid intake, also with hx of renal stones remotely Check CMP/GFR with UA today   Increase fluids, avoid NSAIDS, monitor sugars, will monitor If remains lower than baseline today will have push fluids for 2 weeks then recheck, if remains below baseline will plan to pursue renal US  Obesity with co morbidities Long discussion about weight loss, diet, and exercise Recommended diet heavy in fruits and veggies and low in animal meats, cheeses, and dairy products, appropriate calorie intake Discussed ideal weight for height  Will follow up in 3 months  Vitamin D Def At goal at last visit; continue supplementation to maintain goal of 60-100 Defer Vit D level  Gout Continue allopurinol Diet discussed Check uric acid as needed  Continue diet and meds as discussed. Further disposition pending results of labs. Discussed med's effects and SE's.   Over 30 minutes of exam, counseling, chart review, and critical decision making was performed.   Future Appointments  Date Time Provider [place]  09/09/2020  9:30 AM Unk Pinto, MD [facility] None  03/03/2021 10:00 AM Unk Pinto, MD [facility] None    ----------------------------------------------------------------------------------------------------------------------  HPI 79 y.o. male  presents for 3 month follow up on hypertension, cholesterol, diet-controlled diabetes, obesity, gout and vitamin D deficiency. Gout controlled on Allopurinol. Other problems include PTSD followed at the [facility]/[place], on lexapro, risperidone and ambien.  BMI is Body mass index is 29.98 kg/m., he has been working on diet and exercise, eating 2 meals a day, exercise limited by knee pain (followed by [place], just finishing up PT). Plans to continue with knee exercises at home every other day for 1 hour.  Wt Readings from Last 3 Encounters:  06/03/20 203 lb (92.1 kg)  06/02/20 190 lb (86.2 kg)  02/26/20 209 lb 3.2 oz (94.9 kg)   In 2009, patient has PCI/Stent of a 99% Circ stenosis and Lexiscan in 2017 was Negative. His blood pressure has not been controlled at home, today their BP is BP: 120/68   He does workout. He denies chest pain, shortness of breath, dizziness.   He is on cholesterol medication (rosuvastatin 40 mg daily) and denies myalgias. His cholesterol is at goal. The cholesterol last visit was:   Lab Results  Component Value Date   CHOL 123 02/26/2020   HDL 39 (L) 02/26/2020   LDLCALC 66 02/26/2020   TRIG 97 02/26/2020   CHOLHDL 3.2 02/26/2020    He has been working on diet and exercise for diet controlled diabetes, and denies foot ulcerations, increased appetite, nausea, paresthesia of the feet, polydipsia, polyuria, visual disturbances, vomiting and weight loss. Does not check sugars due to excellent control for several years. Last A1C in the office was:  Lab Results  Component Value Date   HGBA1C 6.4 (H) 02/26/2020   He has CKD associated with T2DM, had been borderline 2/3 for several years, noted to be trending down over last 3 checks. He admits to poor water intake, estimates 3-4 glasses of 12  ounces daily, and 1 can of fanta daily. Denies NSAID use, does tylenol PRN.  Does have hx of renal calculi with remote lithotripsy, last remote. Denies recent pain or episodes.  Consider follow up US.  Lab Results  Component Value Date   GFRNONAA 48 (L) 02/26/2020   GFRNONAA 50 (L) 11/07/2019   GFRNONAA 53 (L) 08/07/2019   Lab Results  Component Value Date   CREATININE 1.38 (H) 02/26/2020   CREATININE 1.35 (H) 11/07/2019   CREATININE 1.28 (H) 08/07/2019   Patient is on Vitamin D supplement and at goal at recent check:    Lab Results  Component Value Date   VD25OH 102 (H) 02/26/2020        Current Medications:  Current Outpatient Medications on File Prior to Visit  Medication Sig  . allopurinol (ZYLOPRIM) 300 MG tablet Take 300 mg by mouth daily.  Marland Kitchen amLODipine (NORVASC) 5 MG tablet Take 1 tablet (5 mg total) by mouth daily.  Marland Kitchen aspirin 81 MG tablet Take 81 mg by mouth daily.  Marland Kitchen atenolol (TENORMIN) 100 MG tablet Take 1 tablet Daily for BP  . escitalopram (LEXAPRO) 20 MG tablet Take 20 mg by mouth daily.  Marland Kitchen losartan-hydrochlorothiazide (HYZAAR) 100-25 MG tablet Take 1 tablet Daily for BP  . nitroGLYCERIN (NITROSTAT) 0.4 MG SL tablet Place 0.4 mg under the tongue every 5 (five) minutes as needed.    . potassium chloride SA (K-DUR) 20 MEQ tablet Take 1 tablet 2 x /day  . propranolol (INDERAL) 10 MG tablet Take 10 mg by mouth as needed.  . pyridOXINE (VITAMIN B-6) 50 MG tablet Take 50 mg by mouth daily.    . risperiDONE (RISPERDAL) 2 MG tablet Take 1 mg by mouth at bedtime.   . rosuvastatin (CRESTOR) 40 MG tablet Take 1 tablet (40 mg total) by mouth daily.  . Tamsulosin HCl (FLOMAX) 0.4 MG CAPS Take 0.4 mg by mouth daily.    Marland Kitchen zolpidem (AMBIEN) 10 MG tablet TAKE 1 TABLET(10 MG) BY MOUTH AT BEDTIME AS NEEDED   No current facility-administered medications on file prior to visit.     Allergies: No Known Allergies   Medical History:  Past Medical History:  Diagnosis Date  .  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 Arthritis   . Axillary abscess    left  . CAD (coronary artery disease)   . Diverticulitis, colon   . History of colonic polyps   . HTN (hypertension)   . Hyperglycemia    diet controlled  . Hyperlipidemia   . Radiation proctitis    with chronic bleeding   Family history- Reviewed and unchanged Social history- Reviewed and unchanged   Review of Systems:  Review of Systems  Constitutional: Negative for malaise/fatigue and weight loss.  HENT: Negative for hearing loss and tinnitus.   Eyes: Negative for blurred vision and double vision.  Respiratory: Negative for cough, shortness of breath and wheezing.   Cardiovascular: Negative for chest pain, palpitations, orthopnea, claudication and leg swelling.  Gastrointestinal: Negative for abdominal pain, blood in stool, constipation, diarrhea, heartburn, melena, nausea and vomiting.  Genitourinary: Negative.   Musculoskeletal: Negative for joint pain and myalgias.  Skin: Negative for rash.  Neurological: Negative  for dizziness, tingling, sensory change, weakness and headaches.  Endo/Heme/Allergies: Negative for polydipsia.  Psychiatric/Behavioral: Negative.   All other systems reviewed and are negative.    Physical Exam: BP 120/68   Pulse (!) 49   Temp (!) 97.3 F (36.3 C)   Ht 5\' 9"  (1.753 m)   Wt 203 lb (92.1 kg)   SpO2 95%   BMI 29.98 kg/m  Wt Readings from Last 3 Encounters:  06/03/20 203 lb (92.1 kg)  06/02/20 190 lb (86.2 kg)  02/26/20 209 lb 3.2 oz (94.9 kg)   General Appearance: Well nourished, in no apparent distress. Eyes: PERRLA, EOMs, conjunctiva no swelling or erythema Sinuses: No Frontal/maxillary tenderness ENT/Mouth: Bil Ext aud canals clear, TMs without erythema, bulging. No erythema, swelling, or exudate on post pharynx.  Tonsils not swollen or erythematous. Hearing normal.  Neck: Supple, thyroid normal.  Respiratory: Respiratory effort  normal, BS equal bilaterally without rales, rhonchi, wheezing or stridor.  Cardio: RRR with no MRGs. Brisk peripheral pulses without edema.  Abdomen: Soft, + BS.  Non tender, no guarding, rebound, hernias, masses. Lymphatics: Non tender without lymphadenopathy.  Musculoskeletal: Full ROM, 5/5 strength, slow steady gait, no obvious bony abnormality or effusion Skin: Warm, dry without rashes, lesions, ecchymosis.  Neuro: Cranial nerves intact. No cerebellar symptoms.  Psych: Awake and oriented X 3, normal affect, Insight and Judgment appropriate.    Izora Ribas, NP 9:49 AM [facility]

## 2020-06-03 ENCOUNTER — Encounter: Payer: Self-pay | Admitting: Adult Health

## 2020-06-03 ENCOUNTER — Ambulatory Visit (INDEPENDENT_AMBULATORY_CARE_PROVIDER_SITE_OTHER): Payer: Medicare Other | Admitting: Adult Health

## 2020-06-03 ENCOUNTER — Other Ambulatory Visit: Payer: Self-pay

## 2020-06-03 VITALS — BP 120/68 | HR 49 | Temp 97.3°F | Ht 69.0 in | Wt 203.0 lb

## 2020-06-03 DIAGNOSIS — I10 Essential (primary) hypertension: Secondary | ICD-10-CM

## 2020-06-03 DIAGNOSIS — Z79899 Other long term (current) drug therapy: Secondary | ICD-10-CM

## 2020-06-03 DIAGNOSIS — E119 Type 2 diabetes mellitus without complications: Secondary | ICD-10-CM | POA: Diagnosis not present

## 2020-06-03 DIAGNOSIS — E669 Obesity, unspecified: Secondary | ICD-10-CM

## 2020-06-03 DIAGNOSIS — R809 Proteinuria, unspecified: Secondary | ICD-10-CM

## 2020-06-03 DIAGNOSIS — N289 Disorder of kidney and ureter, unspecified: Secondary | ICD-10-CM

## 2020-06-03 DIAGNOSIS — E1169 Type 2 diabetes mellitus with other specified complication: Secondary | ICD-10-CM | POA: Diagnosis not present

## 2020-06-03 DIAGNOSIS — I2510 Atherosclerotic heart disease of native coronary artery without angina pectoris: Secondary | ICD-10-CM

## 2020-06-03 DIAGNOSIS — G4700 Insomnia, unspecified: Secondary | ICD-10-CM

## 2020-06-03 DIAGNOSIS — E1129 Type 2 diabetes mellitus with other diabetic kidney complication: Secondary | ICD-10-CM

## 2020-06-03 DIAGNOSIS — E559 Vitamin D deficiency, unspecified: Secondary | ICD-10-CM

## 2020-06-03 DIAGNOSIS — E1122 Type 2 diabetes mellitus with diabetic chronic kidney disease: Secondary | ICD-10-CM

## 2020-06-03 DIAGNOSIS — F4310 Post-traumatic stress disorder, unspecified: Secondary | ICD-10-CM

## 2020-06-03 DIAGNOSIS — E785 Hyperlipidemia, unspecified: Secondary | ICD-10-CM

## 2020-06-03 DIAGNOSIS — N183 Chronic kidney disease, stage 3 unspecified: Secondary | ICD-10-CM

## 2020-06-03 NOTE — Patient Instructions (Signed)
Please push water intake up - minimum 65+ fluid ounces, ideally up to 80+ fluid ounces - this may help maintain/recover kidney functions   Please continue to avoid all NSAIDs - ibuprofen, aleve, motrin, mobic, etc     Chronic Kidney Disease, Adult Chronic kidney disease (CKD) occurs when the kidneys become damaged slowly over a long period of time. The kidneys are a pair of organs that do many important jobs in the body, including:  Removing waste and extra fluid from the blood to make urine.  Making hormones that maintain the amount of fluid in tissues and blood vessels.  Maintaining the right amount of fluids and chemicals in the body. A small amount of kidney damage may not cause problems, but a large amount of damage may make it hard or impossible for the kidneys to work the way they should. If steps are not taken to slow down kidney damage or to stop it from getting worse, the kidneys may stop working permanently (end-stage renal disease or ESRD). Most of the time, CKD does not go away, but it can often be controlled. People who have CKD are usually able to live normal lives. What are the causes? The most common causes of this condition are diabetes and high blood pressure (hypertension). Other causes include:  Heart and blood vessel (cardiovascular) disease.  Kidney diseases, such as: ? Glomerulonephritis. ? Interstitial nephritis. ? Polycystic kidney disease. ? Renal vascular disease.  Diseases that affect the immune system.  Genetic diseases.  Medicines that damage the kidneys, such as anti-inflammatory medicines.  Being around or being in contact with poisonous (toxic) substances.  A kidney or urinary infection that occurs again and again (recurs).  Vasculitis. This is swelling or inflammation of the blood vessels.  A problem with urine flow that may be caused by: ? Cancer. ? Having kidney stones more than one time. ? An enlarged prostate, in males. What  increases the risk? You are more likely to develop this condition if you:  Are older than age 87.  Are male.  Are African-American, Hispanic, Asian, [place], or American [place].  Are a current or former smoker.  Are obese.  Have a family history of kidney disease or failure.  Often take medicines that are damaging to the kidneys. What are the signs or symptoms? Symptoms of this condition include:  Swelling (edema) of the face, legs, ankles, or feet.  Tiredness (lethargy) and having less energy.  Nausea or vomiting.  Confusion or trouble concentrating.  Problems with urination, such as: ? Painful or burning feeling during urination. ? Decreased urine production. ? Frequent urination, especially at night. ? Bloody urine.  Muscle twitches and cramps, especially in the legs.  Shortness of breath.  Weakness.  Loss of appetite.  Metallic taste in the mouth.  Trouble sleeping.  Dry, itchy skin.  A low blood count (anemia).  Pale lining of the eyelids and surface of the eye (conjunctiva). Symptoms develop slowly and may not be obvious until the kidney damage becomes severe. It is possible to have kidney disease for years without having any symptoms. How is this diagnosed? This condition may be diagnosed based on:  Blood tests.  Urine tests.  Imaging tests, such as an ultrasound or CT scan.  A test in which a sample of tissue is removed from the kidneys to be examined under a microscope (kidney biopsy). These test results will help your health care provider determine how serious the CKD is. How is  this treated? There is no cure for most cases of this condition, but treatment usually relieves symptoms and prevents or slows the progression of the disease. Treatment may include:  Making diet changes, which may require you to avoid alcohol, salty foods (sodium), and foods that are high in potassium, calcium, and protein.  Medicines: ? To lower blood  pressure. ? To control blood glucose. ? To relieve anemia. ? To relieve swelling. ? To protect your bones. ? To improve the balance of electrolytes in your blood.  Removing toxic waste from the body through types of dialysis, if the kidneys can no longer do their job (kidney failure).  Managing any other conditions that are causing your CKD or making it worse. Follow these instructions at home: Medicines  Take over-the-counter and prescription medicines only as told by your health care provider. The dose of some medicines that you take may need to be adjusted.  Do not take any new medicines unless approved by your health care provider. Many medicines can worsen your kidney damage.  Do not take any vitamin and mineral supplements unless approved by your health care provider. Many nutritional supplements can worsen your kidney damage. General instructions  Follow your prescribed diet as told by your health care provider.  Do not use any products that contain nicotine or tobacco, such as cigarettes and e-cigarettes. If you need help quitting, ask your health care provider.  Monitor and track your blood pressure at home. Report changes in your blood pressure as told by your health care provider.  If you are being treated for diabetes, monitor and track your blood sugar (blood glucose) levels as told by your health care provider.  Maintain a healthy weight. If you need help with this, ask your health care provider.  Start or continue an exercise plan. Exercise at least 30 minutes a day, 5 days a week.  Keep your immunizations up to date as told by your health care provider.  Keep all follow-up visits as told by your health care provider. This is important. Where to find more information  American Association of Kidney Patients: BombTimer.gl  National Kidney Foundation: www.kidney.[place]: https://mathis.com/  [facility]: www.lifeoptions.org  and www.kidneyschool.org Contact a health care provider if:  Your symptoms get worse.  You develop new symptoms. Get help right away if:  You develop symptoms of ESRD, which include: ? Headaches. ? Numbness in the hands or feet. ? Easy bruising. ? Frequent hiccups. ? Chest pain. ? Shortness of breath. ? Lack of menstruation, in women.  You have a fever.  You have decreased urine production.  You have pain or bleeding when you urinate. Summary  Chronic kidney disease (CKD) occurs when the kidneys become damaged slowly over a long period of time.  The most common causes of this condition are diabetes and high blood pressure (hypertension).  There is no cure for most cases of this condition, but treatment usually relieves symptoms and prevents or slows the progression of the disease. Treatment may include a combination of medicines and lifestyle changes. This information is not intended to replace advice given to you by your health care provider. Make sure you discuss any questions you have with your health care provider. Document Revised: 10/21/2017 Document Reviewed: 12/16/2016 Elsevier Patient Education  2020 [employer].

## 2020-06-04 ENCOUNTER — Other Ambulatory Visit: Payer: Self-pay | Admitting: Adult Health

## 2020-06-04 DIAGNOSIS — N289 Disorder of kidney and ureter, unspecified: Secondary | ICD-10-CM

## 2020-06-04 DIAGNOSIS — R801 Persistent proteinuria, unspecified: Secondary | ICD-10-CM

## 2020-06-04 LAB — COMPLETE METABOLIC PANEL WITH GFR
AG Ratio: 1.9 (calc) (ref 1.0–2.5)
ALT: 17 U/L (ref 9–46)
AST: 21 U/L (ref 10–35)
Albumin: 4.7 g/dL (ref 3.6–5.1)
Alkaline phosphatase (APISO): 58 U/L (ref 35–144)
BUN/Creatinine Ratio: 14 (calc) (ref 6–22)
BUN: 19 mg/dL (ref 7–25)
CO2: 27 mmol/L (ref 20–32)
Calcium: 10.9 mg/dL — ABNORMAL HIGH (ref 8.6–10.3)
Chloride: 105 mmol/L (ref 98–110)
Creat: 1.38 mg/dL — ABNORMAL HIGH (ref 0.70–1.18)
GFR, Est African American: 56 mL/min/{1.73_m2} — ABNORMAL LOW (ref >=60)
GFR, Est Non African American: 48 mL/min/{1.73_m2} — ABNORMAL LOW (ref >=60)
Globulin: 2.5 g/dL (calc) (ref 1.9–3.7)
Glucose, Bld: 168 mg/dL — ABNORMAL HIGH (ref 65–99)
Potassium: 3.9 mmol/L (ref 3.5–5.3)
Sodium: 141 mmol/L (ref 135–146)
Total Bilirubin: 0.5 mg/dL (ref 0.2–1.2)
Total Protein: 7.2 g/dL (ref 6.1–8.1)

## 2020-06-04 LAB — URINALYSIS, ROUTINE W REFLEX MICROSCOPIC
Bacteria, UA: NONE SEEN /HPF
Bilirubin Urine: NEGATIVE
Glucose, UA: NEGATIVE
Hgb urine dipstick: NEGATIVE
Ketones, ur: NEGATIVE
Leukocytes,Ua: NEGATIVE
Nitrite: NEGATIVE
RBC / HPF: NONE SEEN /HPF (ref 0–2)
Specific Gravity, Urine: 1.023 (ref 1.001–1.03)
pH: 6 (ref 5.0–8.0)

## 2020-06-04 LAB — CBC WITH DIFFERENTIAL/PLATELET
Absolute Monocytes: 414 cells/uL (ref 200–950)
Basophils Absolute: 19 cells/uL (ref 0–200)
Basophils Relative: 0.4 %
Eosinophils Absolute: 113 cells/uL (ref 15–500)
Eosinophils Relative: 2.4 %
HCT: 47.6 % (ref 38.5–50.0)
Hemoglobin: 15.2 g/dL (ref 13.2–17.1)
Lymphs Abs: 1570 cells/uL (ref 850–3900)
MCH: 25.8 pg — ABNORMAL LOW (ref 27.0–33.0)
MCHC: 31.9 g/dL — ABNORMAL LOW (ref 32.0–36.0)
MCV: 80.8 fL (ref 80.0–100.0)
MPV: 10.6 fL (ref 7.5–12.5)
Monocytes Relative: 8.8 %
Neutro Abs: 2585 cells/uL (ref 1500–7800)
Neutrophils Relative %: 55 %
Platelets: 185 10*3/uL (ref 140–400)
RBC: 5.89 10*6/uL — ABNORMAL HIGH (ref 4.20–5.80)
RDW: 15 % (ref 11.0–15.0)
Total Lymphocyte: 33.4 %
WBC: 4.7 10*3/uL (ref 3.8–10.8)

## 2020-06-04 LAB — HEMOGLOBIN A1C
Hgb A1c MFr Bld: 6.4 % of total Hgb — ABNORMAL HIGH (ref <5.7)
Mean Plasma Glucose: 137 (calc)
eAG (mmol/L): 7.6 (calc)

## 2020-06-04 LAB — MAGNESIUM: Magnesium: 2.3 mg/dL (ref 1.5–2.5)

## 2020-06-04 LAB — LIPID PANEL
Cholesterol: 110 mg/dL (ref <200)
HDL: 39 mg/dL — ABNORMAL LOW (ref >=40)
LDL Cholesterol (Calc): 55 mg/dL (calc)
Non-HDL Cholesterol (Calc): 71 mg/dL (calc) (ref <130)
Total CHOL/HDL Ratio: 2.8 (calc) (ref <5.0)
Triglycerides: 79 mg/dL (ref <150)

## 2020-06-04 LAB — TSH: TSH: 2.44 mIU/L (ref 0.40–4.50)

## 2020-06-25 ENCOUNTER — Ambulatory Visit: Payer: Medicare Other

## 2020-06-25 ENCOUNTER — Other Ambulatory Visit: Payer: Self-pay

## 2020-07-02 ENCOUNTER — Other Ambulatory Visit: Payer: Self-pay

## 2020-07-02 ENCOUNTER — Ambulatory Visit (INDEPENDENT_AMBULATORY_CARE_PROVIDER_SITE_OTHER): Payer: Medicare Other

## 2020-07-02 DIAGNOSIS — Z79899 Other long term (current) drug therapy: Secondary | ICD-10-CM

## 2020-07-02 DIAGNOSIS — R801 Persistent proteinuria, unspecified: Secondary | ICD-10-CM

## 2020-07-02 DIAGNOSIS — N289 Disorder of kidney and ureter, unspecified: Secondary | ICD-10-CM

## 2020-07-02 NOTE — Progress Notes (Signed)
Patient presents to the office for a nurse visit to have labs done. No questions or concerns. Vitals taken and recorded.

## 2020-07-03 ENCOUNTER — Other Ambulatory Visit: Payer: Self-pay | Admitting: Adult Health

## 2020-07-04 ENCOUNTER — Encounter: Payer: Self-pay | Admitting: Adult Health

## 2020-07-08 LAB — PROTEIN,TOTAL AND PROTEIN ELECTROPHORESIS, RANDOM URINE(REFL)
Albumin: 30 %
Alpha-1-Globulin, U: 7 %
Alpha-2-Globulin, U: 16 %
Beta Globulin, U: 29 %
Creatinine, Urine: 194 mg/dL (ref 20–320)
Gamma Globulin, U: 19 %
Protein/Creat Ratio: 742 mg/g creat — ABNORMAL HIGH (ref 22–128)
Protein/Creatinine Ratio: 0.742 mg/mg creat — ABNORMAL HIGH (ref 0.022–0.12)
Total Protein, Urine: 144 mg/dL — ABNORMAL HIGH (ref 5–25)

## 2020-07-08 LAB — BASIC METABOLIC PANEL WITH GFR
BUN/Creatinine Ratio: 11 (calc) (ref 6–22)
BUN: 15 mg/dL (ref 7–25)
CO2: 27 mmol/L (ref 20–32)
Calcium: 10.8 mg/dL — ABNORMAL HIGH (ref 8.6–10.3)
Chloride: 103 mmol/L (ref 98–110)
Creat: 1.34 mg/dL — ABNORMAL HIGH (ref 0.70–1.18)
GFR, Est African American: 58 mL/min/{1.73_m2} — ABNORMAL LOW (ref >=60)
GFR, Est Non African American: 50 mL/min/{1.73_m2} — ABNORMAL LOW (ref >=60)
Glucose, Bld: 189 mg/dL — ABNORMAL HIGH (ref 65–99)
Potassium: 3.8 mmol/L (ref 3.5–5.3)
Sodium: 142 mmol/L (ref 135–146)

## 2020-07-08 LAB — PTH, INTACT AND CALCIUM
Calcium: 10.8 mg/dL — ABNORMAL HIGH (ref 8.6–10.3)
PTH: 25 pg/mL (ref 14–64)

## 2020-07-08 LAB — IMMUNOFIXATION INTE

## 2020-07-09 ENCOUNTER — Other Ambulatory Visit: Payer: Self-pay | Admitting: Adult Health

## 2020-07-09 DIAGNOSIS — R801 Persistent proteinuria, unspecified: Secondary | ICD-10-CM

## 2020-07-10 ENCOUNTER — Other Ambulatory Visit: Payer: Self-pay

## 2020-07-10 ENCOUNTER — Telehealth: Payer: Self-pay

## 2020-07-10 MED ORDER — VITAMIN D 125 MCG (5000 UT) PO CAPS: 5000.0000 [IU] | ORAL_CAPSULE | Freq: Every day | ORAL | 0 refills | Status: DC

## 2020-07-10 NOTE — Telephone Encounter (Signed)
Patient is wanting to know what you are looking for in terms of why he has to come back and have more labs done.   I updated Vitamin D dosage in patient's chart. Currently taking 5000 IU.

## 2020-07-30 ENCOUNTER — Other Ambulatory Visit: Payer: Self-pay

## 2020-07-30 ENCOUNTER — Ambulatory Visit (INDEPENDENT_AMBULATORY_CARE_PROVIDER_SITE_OTHER): Payer: Medicare Other

## 2020-07-30 DIAGNOSIS — R801 Persistent proteinuria, unspecified: Secondary | ICD-10-CM

## 2020-07-30 NOTE — Progress Notes (Signed)
Patient presents to the office for a nurse visit to have labs done to check protein in his urine. Vitals taken and recorded.

## 2020-08-01 LAB — PROTEIN ELECTROPHORESIS, SERUM, WITH REFLEX
Albumin ELP: 4.2 g/dL (ref 3.8–4.8)
Alpha 1: 0.3 g/dL (ref 0.2–0.3)
Alpha 2: 1 g/dL — ABNORMAL HIGH (ref 0.5–0.9)
Beta 2: 0.4 g/dL (ref 0.2–0.5)
Beta Globulin: 0.4 g/dL (ref 0.4–0.6)
Gamma Globulin: 1 g/dL (ref 0.8–1.7)
Total Protein: 7.3 g/dL (ref 6.1–8.1)

## 2020-08-01 LAB — KAPPA/LAMBDA LIGHT CHAINS, FREE, WITH RATIO, 24HR. URINE
KAPPA Ligh Chain, Free U: 1097.64 mg/L — ABNORMAL HIGH (ref <=32.90)
Kappa/Lambda,Free Ratio: 5.65 (ref <=8.69)
Lambda Light Chain, Free U: 194.13 mg/L — ABNORMAL HIGH (ref <=3.79)

## 2020-08-08 ENCOUNTER — Other Ambulatory Visit: Payer: Self-pay | Admitting: Adult Health

## 2020-08-08 DIAGNOSIS — R801 Persistent proteinuria, unspecified: Secondary | ICD-10-CM

## 2020-08-08 DIAGNOSIS — E1122 Type 2 diabetes mellitus with diabetic chronic kidney disease: Secondary | ICD-10-CM

## 2020-08-08 DIAGNOSIS — E1129 Type 2 diabetes mellitus with other diabetic kidney complication: Secondary | ICD-10-CM

## 2020-08-12 ENCOUNTER — Encounter: Payer: Self-pay | Admitting: Internal Medicine

## 2020-08-29 ENCOUNTER — Other Ambulatory Visit: Payer: Self-pay | Admitting: Internal Medicine

## 2020-09-08 ENCOUNTER — Encounter: Payer: Self-pay | Admitting: Internal Medicine

## 2020-09-08 NOTE — Progress Notes (Signed)
History of Present Illness:       This very nice 79 y.o.  MBM presents for 6 month follow up with HTN, ASCAD, HLD, Pre-Diabetes and Vitamin D Deficiency.  Patient has Gout controlled on his meds.  Patient is followed at the [facility] for [facility].       Patient is treated for HTN  (1990) & BP has been controlled at home. Today's BP is at goal - 120/74.  In 2009, patient underwent PCA/Stent of the L Cx and in 2017, he had a negative Lexiscan. Patient has had no complaints of any cardiac type chest pain, palpitations, dyspnea / orthopnea / PND, dizziness, claudication, or dependent edema.      Hyperlipidemia is controlled with diet & Rosuvastatin Patient denies myalgias or other med SE's. 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  Last A1c was not at goal:  Lab Results  Component Value Date   HGBA1C 6.4 (H) 06/03/2020       Further, the patient also has history of Vitamin D Deficiency ("38" /2012) and supplements vitamin D without any suspected side-effects. Last vitamin D was at goal:  Lab Results  Component Value Date   VD25OH 102 (H) 02/26/2020    Current Outpatient Medications on File Prior to Visit  Medication Sig  . allopurinol  300 MG tablet Take  daily.  Marland Kitchen amLODipine 5 MG tablet Take 1 tablet  daily.  Marland Kitchen aspirin 81 MG tablet Take  daily.  Marland Kitchen atenolol  100 MG tablet Take 1 tablet Daily for BP  . VITAMIN D5000 U Take 5,000 Units  daily.  Marland Kitchen escitalopram  20 MG  Take  daily.  Marland Kitchen losartan-hctz 100-25 MG  Take 1 tablet Daily for BP  . NITROSTAT 0.4 MG SL  Place under the tongue as needed.    . potassium chloride  20 MEQ  Take    1 tablet      2 x /day      for Potassium   . propranolol 10 MG  Take  as needed.  . pyridOXINE /VITAMIN B-6   50 MG  Take  daily.    . risperiDONE 2 MG  Take 1 mg at bedtime.   . Tamsulosin HCl  0.4 MG CAPS Take 0.4 mg  daily.    Marland Kitchen zolpidem (AMBIEN) 10 MG tablet TAKE 1 TABLET AT BEDTIME AS NEEDED  . rosuvastatin 40 MG tablet Take 1 tablet  daily.    No Known Allergies  PMHx:   Past Medical History:  Diagnosis Date  . 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 Arthritis   . Axillary abscess    left  . CAD (coronary artery disease)   . Diverticulitis, colon   . History of colonic polyps   . HTN (hypertension)   . Hyperglycemia    diet controlled  . Hyperlipidemia   . Radiation proctitis    with chronic bleeding    Immunization History  Administered Date(s) Administered  . Influenza, High Dose Seasonal PF 09/08/2018, 07/24/2019  . Influenza-Unspecified 08/22/2014, 09/03/2015, 08/25/2016  . Moderna SARS-COVID-2 Vaccination 12/06/2019,  01/03/2020  . Pneumococcal Conjugate-13 09/03/2015  . Pneumococcal-Unspecified 08/25/2016  . Td 11/22/2006    Past Surgical History:  Procedure Laterality Date  . COLONOSCOPY    . COLONOSCOPY W/ POLYPECTOMY    . CORONARY ANGIOPLASTY WITH STENT PLACEMENT  04/2008  . MASS EXCISION Left 10/06/2017   Procedure: EXCISION LEFT AXILLA CYST;  Surgeon: Erroll Luna, MD;  Location: [facility];  Service: General;  Laterality: Left;    FHx:    Reviewed / unchanged  SHx:    Reviewed / unchanged   Systems Review:  Constitutional: Denies fever, chills, wt changes, headaches, insomnia, fatigue, night sweats, change in appetite. Eyes: Denies redness, blurred vision, diplopia, discharge, itchy, watery eyes.  ENT: Denies discharge, congestion, post nasal drip, epistaxis, sore throat, earache, hearing loss, dental pain, tinnitus, vertigo, sinus pain, snoring.  CV: Denies chest pain, palpitations, irregular heartbeat, syncope, dyspnea, diaphoresis,  orthopnea, PND, claudication or edema. Respiratory: denies cough, dyspnea, DOE, pleurisy, hoarseness, laryngitis, wheezing.  Gastrointestinal: Denies dysphagia, odynophagia, heartburn, reflux, water brash, abdominal pain or cramps, nausea, vomiting, bloating, diarrhea, constipation, hematemesis, melena, hematochezia  or hemorrhoids. Genitourinary: Denies dysuria, frequency, urgency, nocturia, hesitancy, discharge, hematuria or flank pain. Musculoskeletal: Denies arthralgias, myalgias, stiffness, jt. swelling, pain, limping or strain/sprain.  Skin: Denies pruritus, rash, hives, warts, acne, eczema or change in skin lesion(s). Neuro: No weakness, tremor, incoordination, spasms, paresthesia or pain. Psychiatric: Denies confusion, memory loss or sensory loss. Endo: Denies change in weight, skin or hair change.  Heme/Lymph: No excessive bleeding, bruising or enlarged lymph nodes.  Physical Exam  BP 120/74   Pulse (!) 48   Temp (!) 97.2 F (36.2 C)   Resp 16   Ht 5\' 9"  (1.753 m)   Wt 203 lb 12.8 oz (92.4 kg)   SpO2 98%   BMI 30.10 kg/m   Appears  well nourished, well groomed  and in no distress.  Eyes: PERRLA, EOMs, conjunctiva no swelling or erythema. Sinuses: No frontal/maxillary tenderness ENT/Mouth: EAC's clear, TM's nl w/o erythema, bulging. Nares clear w/o erythema, swelling, exudates. Oropharynx clear without erythema or exudates. Oral hygiene is good. Tongue normal, non obstructing. Hearing intact.  Neck: Supple. Thyroid not palpable. Car 2+/2+ without bruits, nodes or JVD. Chest: Respirations nl with BS clear & equal w/o rales, rhonchi, wheezing or stridor.  Cor: Heart sounds normal w/ regular rate and rhythm without sig. murmurs, gallops, clicks or rubs. Peripheral pulses normal and equal  without edema.  Abdomen: Soft & bowel sounds normal. Non-tender w/o guarding, rebound, hernias, masses or organomegaly.  Lymphatics: Unremarkable.  Musculoskeletal: Full ROM all peripheral  extremities, joint stability, 5/5 strength and normal gait.  Skin: Warm, dry without exposed rashes, lesions or ecchymosis apparent.  Neuro: Cranial nerves intact, reflexes equal bilaterally. Sensory-motor testing grossly intact. Tendon reflexes grossly intact.  Pysch: Alert & oriented x 3.  Insight and judgement nl & appropriate. No ideations.  Assessment and Plan:  1. Essential hypertension  - Continue medication, monitor blood pressure at home.  - Continue DASH diet.  Reminder to go to the ER if any CP,  SOB, nausea, dizziness, severe HA, changes vision/speech.  - CBC with Differential/Platelet - COMPLETE METABOLIC PANEL WITH GFR - Magnesium - TSH  2. Hyperlipidemia associated with type 2 diabetes mellitus ([place])  - Continue diet/meds, exercise,& lifestyle modifications.  - Continue monitor periodic cholesterol/liver & renal functions   - Lipid panel - TSH  3. Diabetes mellitus due to underlying condition with stage 3a  chronic kidney  disease, without long-term current use of insulin ([facility])  - Continue diet, exercise  - Lifestyle modifications.  - Monitor appropriate labs.  - Hemoglobin A1c - Insulin, random  4. Vitamin D deficiency  - Continue supplementation.  - VITAMIN D 25 Hydroxy   5. Idiopathic gout  - Uric acid  6. Atherosclerosis of native coronary artery without angina pectoris - Lipid panel  7. Medication management  - CBC with Differential/Platelet - COMPLETE METABOLIC PANEL WITH GFR - Magnesium - Lipid panel - TSH - Hemoglobin A1c - Insulin, random - VITAMIN D 25 Hydroxy - Uric acid       Discussed  regular exercise, BP monitoring, weight control to achieve/maintain BMI less than 25 and discussed med and SE's. Recommended labs to assess and monitor clinical status with further disposition pending results of labs.  I discussed the assessment and treatment plan with the patient. The patient was provided an opportunity to ask questions and all  were answered. The patient agreed with the plan and demonstrated an understanding of the instructions.  I provided over 30 minutes of exam, counseling, chart review and  complex critical decision making.      Kirtland Bouchard, MD

## 2020-09-08 NOTE — Patient Instructions (Signed)

## 2020-09-09 ENCOUNTER — Other Ambulatory Visit: Payer: Self-pay

## 2020-09-09 ENCOUNTER — Ambulatory Visit (INDEPENDENT_AMBULATORY_CARE_PROVIDER_SITE_OTHER): Payer: Medicare Other | Admitting: Internal Medicine

## 2020-09-09 VITALS — BP 120/74 | HR 48 | Temp 97.2°F | Resp 16 | Ht 69.0 in | Wt 203.8 lb

## 2020-09-09 DIAGNOSIS — I10 Essential (primary) hypertension: Secondary | ICD-10-CM

## 2020-09-09 DIAGNOSIS — E0822 Diabetes mellitus due to underlying condition with diabetic chronic kidney disease: Secondary | ICD-10-CM | POA: Diagnosis not present

## 2020-09-09 DIAGNOSIS — E559 Vitamin D deficiency, unspecified: Secondary | ICD-10-CM | POA: Diagnosis not present

## 2020-09-09 DIAGNOSIS — M1000 Idiopathic gout, unspecified site: Secondary | ICD-10-CM

## 2020-09-09 DIAGNOSIS — Z79899 Other long term (current) drug therapy: Secondary | ICD-10-CM

## 2020-09-09 DIAGNOSIS — I2510 Atherosclerotic heart disease of native coronary artery without angina pectoris: Secondary | ICD-10-CM

## 2020-09-09 DIAGNOSIS — E785 Hyperlipidemia, unspecified: Secondary | ICD-10-CM

## 2020-09-09 DIAGNOSIS — E1169 Type 2 diabetes mellitus with other specified complication: Secondary | ICD-10-CM

## 2020-09-09 DIAGNOSIS — N1831 Chronic kidney disease, stage 3a: Secondary | ICD-10-CM

## 2020-09-10 LAB — COMPLETE METABOLIC PANEL WITH GFR
AG Ratio: 1.8 (calc) (ref 1.0–2.5)
ALT: 19 U/L (ref 9–46)
AST: 21 U/L (ref 10–35)
Albumin: 4.6 g/dL (ref 3.6–5.1)
Alkaline phosphatase (APISO): 57 U/L (ref 35–144)
BUN/Creatinine Ratio: 11 (calc) (ref 6–22)
BUN: 16 mg/dL (ref 7–25)
CO2: 29 mmol/L (ref 20–32)
Calcium: 10.9 mg/dL — ABNORMAL HIGH (ref 8.6–10.3)
Chloride: 101 mmol/L (ref 98–110)
Creat: 1.51 mg/dL — ABNORMAL HIGH (ref 0.70–1.18)
GFR, Est African American: 50 mL/min/{1.73_m2} — ABNORMAL LOW (ref >=60)
GFR, Est Non African American: 43 mL/min/{1.73_m2} — ABNORMAL LOW (ref >=60)
Globulin: 2.6 g/dL (calc) (ref 1.9–3.7)
Glucose, Bld: 182 mg/dL — ABNORMAL HIGH (ref 65–99)
Potassium: 3.9 mmol/L (ref 3.5–5.3)
Sodium: 141 mmol/L (ref 135–146)
Total Bilirubin: 0.5 mg/dL (ref 0.2–1.2)
Total Protein: 7.2 g/dL (ref 6.1–8.1)

## 2020-09-10 LAB — MAGNESIUM: Magnesium: 2.3 mg/dL (ref 1.5–2.5)

## 2020-09-10 LAB — LIPID PANEL
Cholesterol: 119 mg/dL (ref <200)
HDL: 41 mg/dL (ref >=40)
LDL Cholesterol (Calc): 60 mg/dL (calc)
Non-HDL Cholesterol (Calc): 78 mg/dL (calc) (ref <130)
Total CHOL/HDL Ratio: 2.9 (calc) (ref <5.0)
Triglycerides: 97 mg/dL (ref <150)

## 2020-09-10 LAB — CBC WITH DIFFERENTIAL/PLATELET
Absolute Monocytes: 338 cells/uL (ref 200–950)
Basophils Absolute: 18 cells/uL (ref 0–200)
Basophils Relative: 0.4 %
Eosinophils Absolute: 90 cells/uL (ref 15–500)
Eosinophils Relative: 2 %
HCT: 47.6 % (ref 38.5–50.0)
Hemoglobin: 15.1 g/dL (ref 13.2–17.1)
Lymphs Abs: 1994 cells/uL (ref 850–3900)
MCH: 25.3 pg — ABNORMAL LOW (ref 27.0–33.0)
MCHC: 31.7 g/dL — ABNORMAL LOW (ref 32.0–36.0)
MCV: 79.9 fL — ABNORMAL LOW (ref 80.0–100.0)
MPV: 10.7 fL (ref 7.5–12.5)
Monocytes Relative: 7.5 %
Neutro Abs: 2061 cells/uL (ref 1500–7800)
Neutrophils Relative %: 45.8 %
Platelets: 194 10*3/uL (ref 140–400)
RBC: 5.96 10*6/uL — ABNORMAL HIGH (ref 4.20–5.80)
RDW: 15.2 % — ABNORMAL HIGH (ref 11.0–15.0)
Total Lymphocyte: 44.3 %
WBC: 4.5 10*3/uL (ref 3.8–10.8)

## 2020-09-10 LAB — HEMOGLOBIN A1C
Hgb A1c MFr Bld: 6.4 % of total Hgb — ABNORMAL HIGH (ref <5.7)
Mean Plasma Glucose: 137 (calc)
eAG (mmol/L): 7.6 (calc)

## 2020-09-10 LAB — INSULIN, RANDOM: Insulin: 142.8 u[IU]/mL — ABNORMAL HIGH

## 2020-09-10 LAB — VITAMIN D 25 HYDROXY (VIT D DEFICIENCY, FRACTURES): Vit D, 25-Hydroxy: 137 ng/mL — ABNORMAL HIGH (ref 30–100)

## 2020-09-10 LAB — TSH: TSH: 2.64 mIU/L (ref 0.40–4.50)

## 2020-09-10 LAB — URIC ACID: Uric Acid, Serum: 3.5 mg/dL — ABNORMAL LOW (ref 4.0–8.0)

## 2020-09-10 NOTE — Progress Notes (Signed)
========================================================== ==========================================================  -    CBC - looks OK ==========================================================  -  Kidney functions decreased slightly from Stage 3a down to Stage 3b  - So it's very important that you drink more fluids / water , etc to protect kidneys from more damage ==========================================================  -  Total Chol = 119 and LDL chol = 60 - both  Excellent   - Very low risk for Heart Attack  / Stroke =============================================================  - A1c = 6.4%- Still too high- Goal is less than 6.0% and Ideal is less than 5.7%  ! ==========================================================  -  Vitamin D = 137 - too high    - Ideal is between 70-100  - So stop your Vitamin D for 1 week   - decrease your Vit D 5,000 unit capsule to 1 capsule every other /day   - for example , take even days of the month ==========================================================  -  Please mail print-out to take to [place]

## 2020-09-16 ENCOUNTER — Telehealth: Payer: Self-pay | Admitting: *Deleted

## 2020-09-16 NOTE — Telephone Encounter (Signed)
Mailed lab results to patient.

## 2020-10-02 ENCOUNTER — Other Ambulatory Visit: Payer: Self-pay | Admitting: Internal Medicine

## 2020-10-02 MED ORDER — TRAZODONE HCL 150 MG PO TABS: ORAL_TABLET | ORAL | 0 refills | Status: DC

## 2020-10-06 ENCOUNTER — Other Ambulatory Visit: Payer: Self-pay | Admitting: Nephrology

## 2020-10-06 DIAGNOSIS — N183 Chronic kidney disease, stage 3 unspecified: Secondary | ICD-10-CM

## 2020-10-06 DIAGNOSIS — R809 Proteinuria, unspecified: Secondary | ICD-10-CM

## 2020-10-20 ENCOUNTER — Ambulatory Visit
Admission: RE | Admit: 2020-10-20 | Discharge: 2020-10-20 | Disposition: A | Discharge status: Home / Self Care | Payer: Medicare Other | Source: Ambulatory Visit | Attending: Nephrology | Admitting: Nephrology

## 2020-10-20 DIAGNOSIS — N183 Chronic kidney disease, stage 3 unspecified: Secondary | ICD-10-CM

## 2020-10-20 DIAGNOSIS — R809 Proteinuria, unspecified: Secondary | ICD-10-CM

## 2020-10-20 IMAGING — US US RENAL
1 series · 14 of 25 positions shown · non-contrast
Comparison: [DATE]

CLINICAL DATA: Stage III chronic kidney disease, proteinuria,
hypertension, type II diabetes mellitus

EXAM:
RENAL / URINARY TRACT ULTRASOUND COMPLETE

[Series 1: us renal · 0.25mm/px · 14 of 47 slices shown]
[im 1/47]
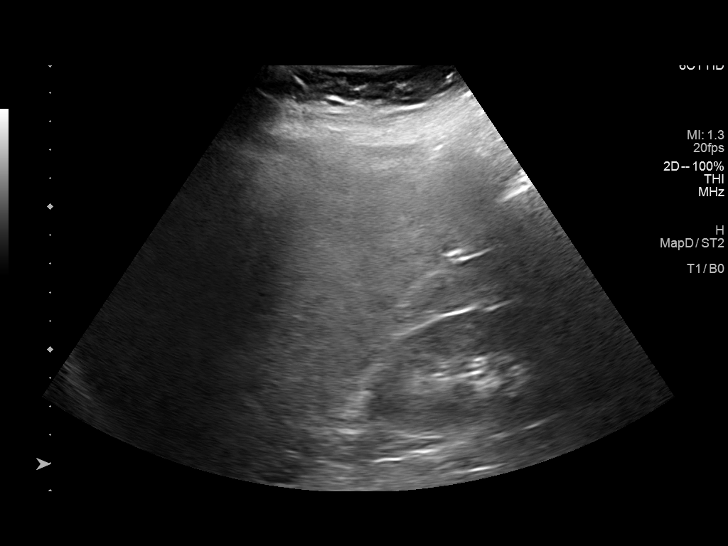
[im 4/47]
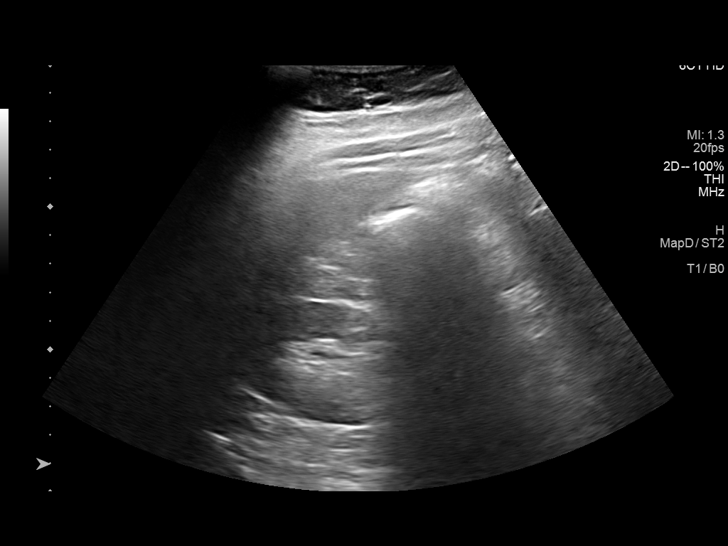
[im 8/47]
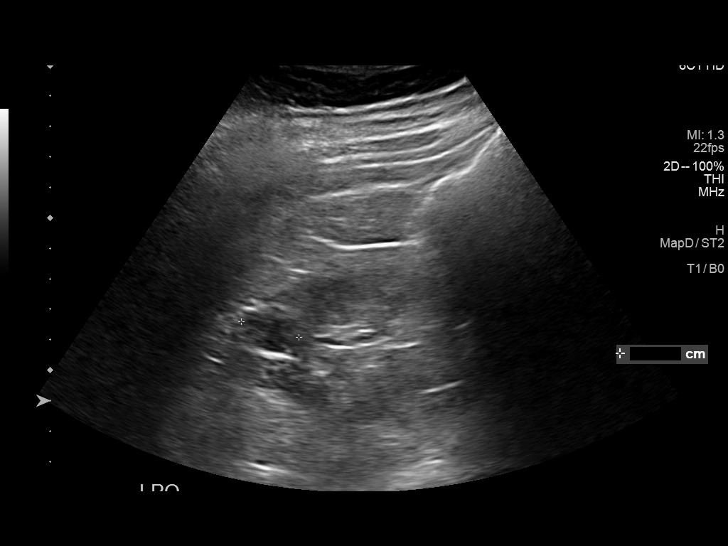
[im 12/47]
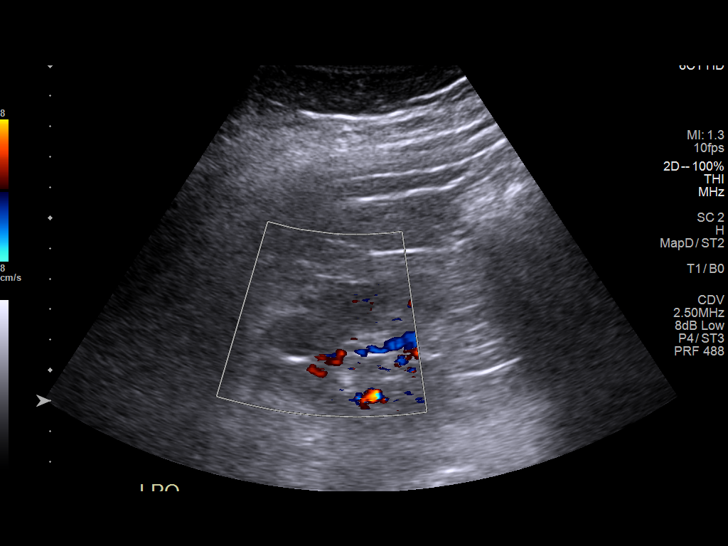
[im 16/47]
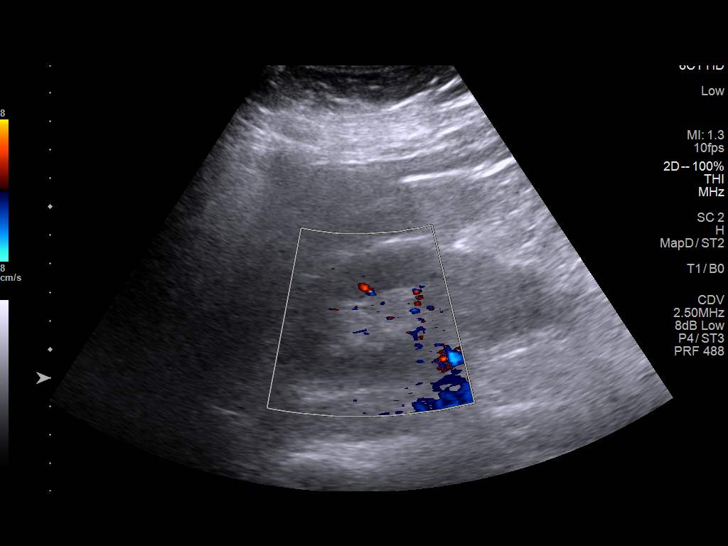
[im 18/47]
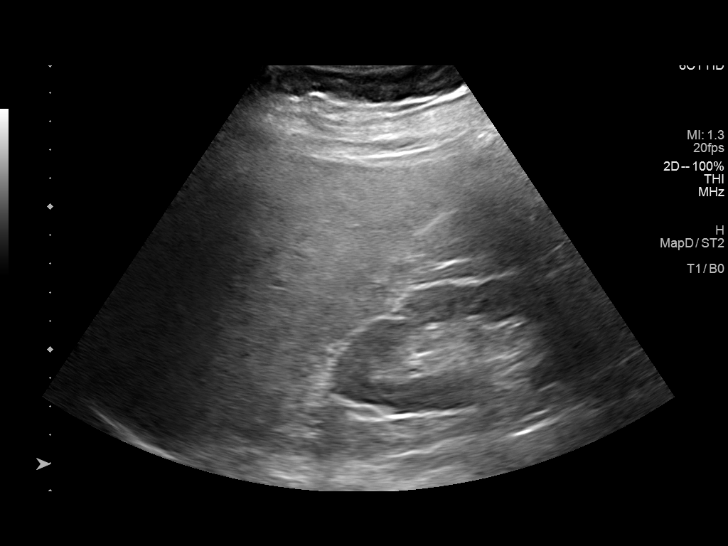
[im 22/47]
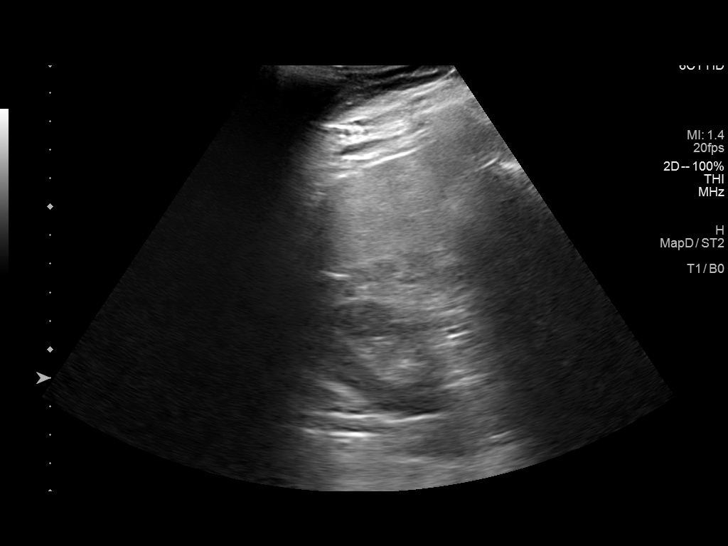
[im 25/47]
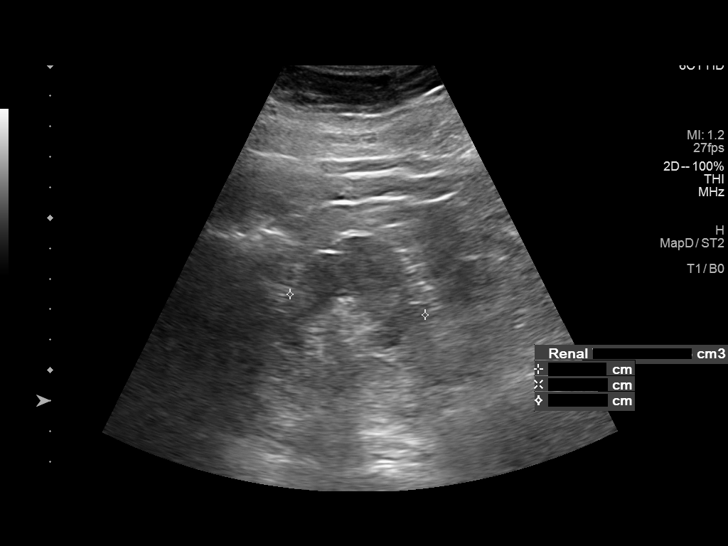
[im 29/47]
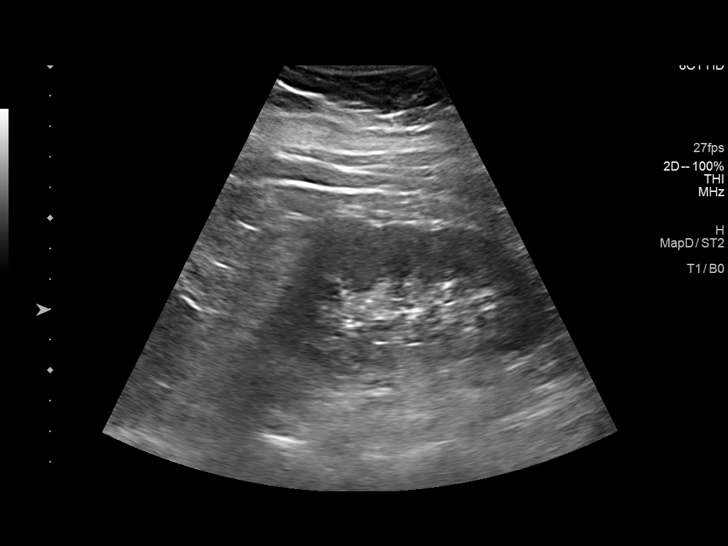
[im 31/47]
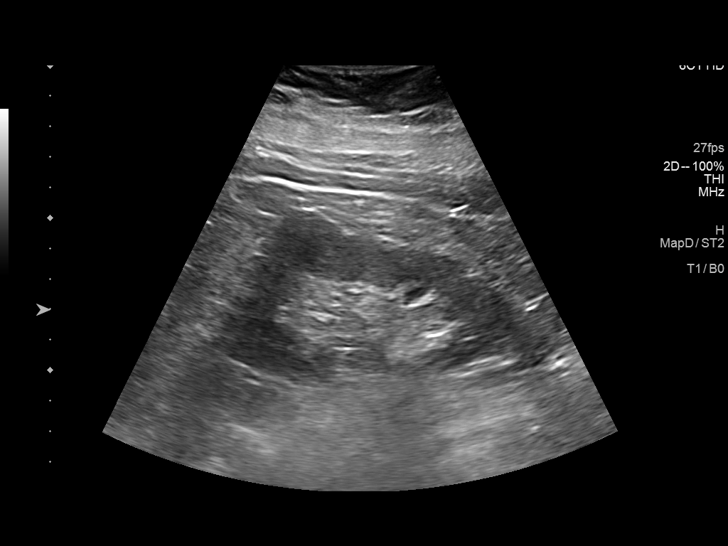
[im 35/47]
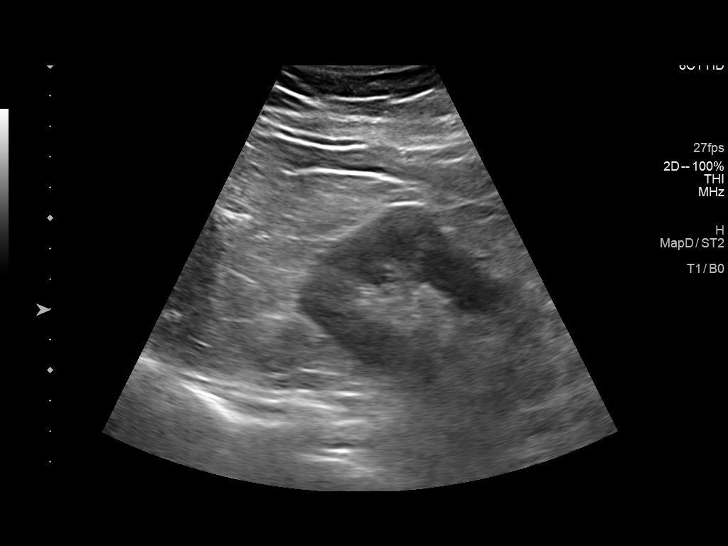
[im 39/47]
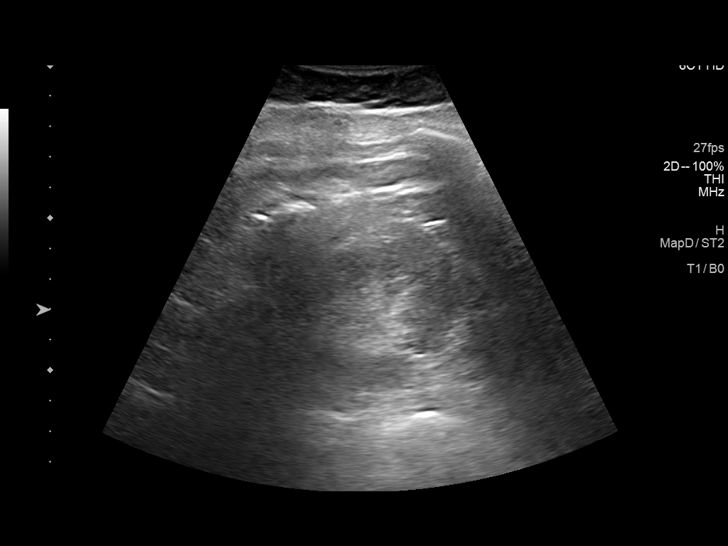
[im 43/47]
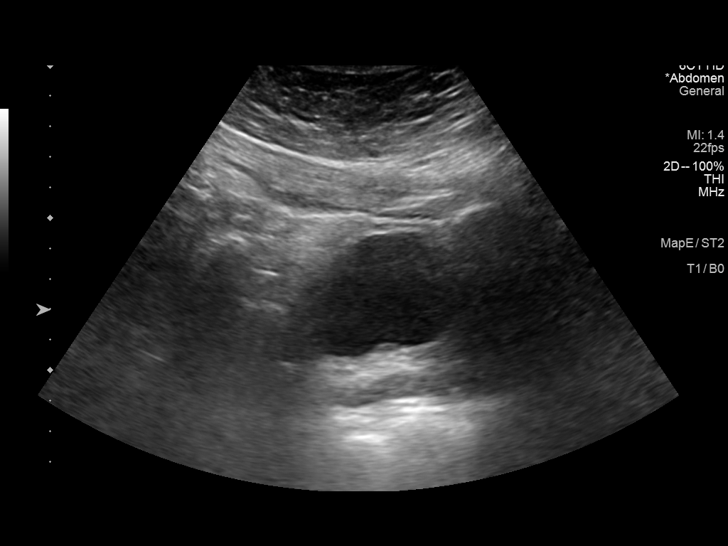
[im 47/47]
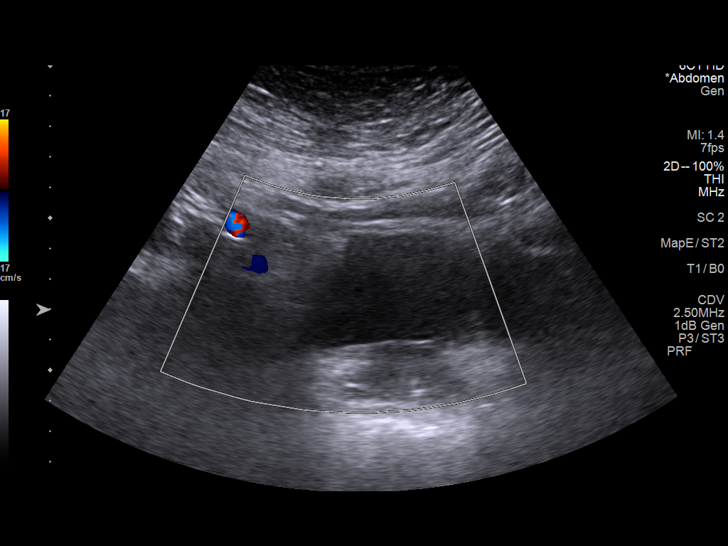

[14 of 25 positions shown; findings below may reference images not displayed]

FINDINGS: Right Kidney:

Renal measurements: 10.9 x 4.5 x 4.3 cm = volume: 110 mL. Normal
cortical thickness and echogenicity. Suboptimally visualized due to
body habitus. Questionable mass at mid kidney 15 x 11 x 20 mm, does
not have the appearance of a simple cyst. No additional mass or
hydronephrosis.

Left Kidney:

Renal measurements: 9.7 x 5.9 x 4.5 cm = volume: 134 mL. Normal
cortical thickness and echogenicity. No mass, hydronephrosis or
shadowing calcification.

Bladder:

Appears normal for degree of bladder distention.

Other:

N/A
IMPRESSION: Normal appearing LEFT kidney.

Questionable mass at mid RIGHT kidney 15 x 11 x 20 mm, incompletely
characterized; further evaluation by MR recommended.

## 2020-11-27 ENCOUNTER — Other Ambulatory Visit: Payer: Self-pay | Admitting: Internal Medicine

## 2020-11-27 DIAGNOSIS — I10 Essential (primary) hypertension: Secondary | ICD-10-CM

## 2020-11-27 MED ORDER — LOSARTAN POTASSIUM-HCTZ 100-25 MG PO TABS: ORAL_TABLET | ORAL | 0 refills | Status: DC

## 2020-12-25 ENCOUNTER — Ambulatory Visit (INDEPENDENT_AMBULATORY_CARE_PROVIDER_SITE_OTHER): Payer: Medicare Other | Admitting: Adult Health Nurse Practitioner

## 2020-12-25 ENCOUNTER — Encounter: Payer: Self-pay | Admitting: Adult Health Nurse Practitioner

## 2020-12-25 ENCOUNTER — Other Ambulatory Visit: Payer: Self-pay

## 2020-12-25 VITALS — BP 126/80 | HR 50 | Temp 97.6°F | Ht 69.0 in | Wt 206.0 lb

## 2020-12-25 DIAGNOSIS — E785 Hyperlipidemia, unspecified: Secondary | ICD-10-CM

## 2020-12-25 DIAGNOSIS — Z0000 Encounter for general adult medical examination without abnormal findings: Secondary | ICD-10-CM

## 2020-12-25 DIAGNOSIS — E1122 Type 2 diabetes mellitus with diabetic chronic kidney disease: Secondary | ICD-10-CM

## 2020-12-25 DIAGNOSIS — I2510 Atherosclerotic heart disease of native coronary artery without angina pectoris: Secondary | ICD-10-CM

## 2020-12-25 DIAGNOSIS — E119 Type 2 diabetes mellitus without complications: Secondary | ICD-10-CM

## 2020-12-25 DIAGNOSIS — E559 Vitamin D deficiency, unspecified: Secondary | ICD-10-CM

## 2020-12-25 DIAGNOSIS — E66811 Obesity, class 1: Secondary | ICD-10-CM

## 2020-12-25 DIAGNOSIS — M1000 Idiopathic gout, unspecified site: Secondary | ICD-10-CM

## 2020-12-25 DIAGNOSIS — D126 Benign neoplasm of colon, unspecified: Secondary | ICD-10-CM | POA: Diagnosis not present

## 2020-12-25 DIAGNOSIS — R6889 Other general symptoms and signs: Secondary | ICD-10-CM

## 2020-12-25 DIAGNOSIS — C61 Malignant neoplasm of prostate: Secondary | ICD-10-CM

## 2020-12-25 DIAGNOSIS — N183 Chronic kidney disease, stage 3 unspecified: Secondary | ICD-10-CM

## 2020-12-25 DIAGNOSIS — Z0001 Encounter for general adult medical examination with abnormal findings: Secondary | ICD-10-CM | POA: Diagnosis not present

## 2020-12-25 DIAGNOSIS — F4310 Post-traumatic stress disorder, unspecified: Secondary | ICD-10-CM

## 2020-12-25 DIAGNOSIS — E1169 Type 2 diabetes mellitus with other specified complication: Secondary | ICD-10-CM | POA: Diagnosis not present

## 2020-12-25 DIAGNOSIS — I10 Essential (primary) hypertension: Secondary | ICD-10-CM

## 2020-12-25 DIAGNOSIS — E669 Obesity, unspecified: Secondary | ICD-10-CM

## 2020-12-25 DIAGNOSIS — M170 Bilateral primary osteoarthritis of knee: Secondary | ICD-10-CM

## 2020-12-25 DIAGNOSIS — K5730 Diverticulosis of large intestine without perforation or abscess without bleeding: Secondary | ICD-10-CM

## 2020-12-25 DIAGNOSIS — F5101 Primary insomnia: Secondary | ICD-10-CM

## 2020-12-25 DIAGNOSIS — K520 Gastroenteritis and colitis due to radiation: Secondary | ICD-10-CM

## 2020-12-25 MED ORDER — ZOLPIDEM TARTRATE 10 MG PO TABS: 10.0000 mg | ORAL_TABLET | Freq: Every day | ORAL | 1 refills | Status: DC

## 2020-12-25 NOTE — Patient Instructions (Addendum)
   We have sent in a prescription for Zolpidem (Ambien) 10mg .  Try using half tablet to see if this is effective.  Decrease in kidney function can cause build up in your system and adverse side effects.   Try a toileting schedule.  Go to the bathroom EVERY two hours even if you feel like you don't have to go.  If this does not help with the leaking, go every hour.  If this does not help your symptom please let us know.  We can try a medication called Myrbetriq.   GENERAL HEALTH GOALS  Know what a healthy weight is for you (roughly BMI <25) and aim to maintain this  Aim for 7+ servings of fruits and vegetables daily  70-80+ fluid ounces of water or unsweet tea for healthy kidneys  Limit to max 1 drink of alcohol per day; avoid smoking/tobacco  Limit animal fats in diet for cholesterol and heart health - choose grass fed whenever available  Avoid highly processed foods, and foods high in saturated/trans fats  Aim for low stress - take time to unwind and care for your mental health  Aim for 150 min of moderate intensity exercise weekly for heart health, and weights twice weekly for bone health  Aim for 7-9 hours of sleep daily

## 2020-12-25 NOTE — Progress Notes (Signed)
MEDICARE ANNUAL WELLNESS VISIT AND FOLLOW UP   Assessment:   Encounter for Medicare annual wellness exam Yearly  Essential hypertension - continue medications, DASH diet, exercise and monitor at home. Call if greater than 130/80.  -     furosemide (LASIX) 40 MG tablet; Take 1 tablet (40 mg total) by mouth daily. -     CBC with Differential/Platelet -     CMP/GFR -     TSH -     losartan-hydrochlorothiazide (HYZAAR) 100-25 MG tablet; Take 1 tablet by mouth daily. Follows with Dr. Percival Spanish  Atherosclerosis of native coronary artery without angina pectoris, unspecified whether native or transplanted heart Control blood pressure, cholesterol, glucose, increase exercise.  Follow up Dr. Percival Spanish  Hyperlipidemia -continue medications, check lipids, decrease fatty foods, increase activity.  -     Lipid panel  Diet-controlled diabetes mellitus ([place]) Discussed general issues about diabetes pathophysiology and management., Educational material distributed., Suggested low cholesterol diet., Encouraged aerobic exercise., Discussed foot care., Reminded to get yearly retinal exam. 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  Insomnia - good sleep hygiene discussed, increase day time activity, has tried try melatonin, benadryl, trazodone Discussed other medications with patient. Declines trial of another medication. Despite discussion of risks of medication, instant upon treatment -     zolpidem (AMBIEN) 10 MG tablet; Take 1 tablet (10 mg total) by mouth at bedtime.  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 Discussed med's effects and SE's.   Over 30 minutes of face to face interview, exam, counseling, chart review, and critical decision making was performed.    Future Appointments  Date Time Provider [place]  04/21/2021 10:00 AM [name] Pinto, [name] Colon [facility] None  12/29/2021  9:30 AM [name] Sierras, [name] Colon [facility] None    Plan:   During the course of the visit the patient was educated and counseled about appropriate screening and preventive services including:    Pneumococcal vaccine   Influenza vaccine  Prevnar 13  Td vaccine  Screening electrocardiogram  Colorectal cancer screening  Diabetes screening  Glaucoma screening  Nutrition counseling    Subjective:  [name] Colon is a 80 y.o. male who presents for Medicare Annual Wellness Visit and 3 month follow up for HTN, hyperlipidemia, diet controlled T2DM, and vitamin D Def.    Has bilateral OA knees. Has mobic that helps some, following with the [place]. Currently doing PT. Has brace/cane but not using.   Patient was treated for Prostate Ca with Brachiotherapy in 2002, was released 2017 and has hx/ radiation colitis  and (+) stool hemoccults, had recent colonoscopy 02/2018 and recommended follow up in 2022.    He has insomnia treated with trazodone, IT was helping with his sleep but in the  morning he felt tired and hard to get going the next day.  He even tried taking half  medication and still had similar side effects and foggy feeling the next day. He has used amibien in the past with good results and he reports that he would like to use this again.  He has also tried melatoning and benadryl to help with his sleep but he does not sleep through the whole night.   He is having problems with incontinence during the day, leaking.  He is going to the bathroom maybe every four hours.  He works with [facility] for PTSD, stable on risperidone.   BMI is Body mass index is 30.42 kg/m., he has been working on diet and exercise, knees limit exercise, doing 20-30 min of exercise most days, working with PT, cut down on potatoes.  Wt Readings from Last 3 Encounters:  12/25/20 206 lb (93.4 kg)  09/09/20 203 lb 12.8 oz (92.4 kg)  07/30/20 202 lb (91.6 kg)   He is s/p stent in 2009, had normal stress test 02/2016 with Dr. Percival Spanish. Is prescribed nitroglycerine but has never needed to use this. His blood pressure has been controlled at home, today their BP is BP: 126/80  He does not workout. He denies chest pain, shortness of breath, dizziness.   He is on cholesterol medication (rosuvastatin 40mg  daily) and denies myalgias. His cholesterol is at goal. The cholesterol last visit was:   Lab Results  Component Value Date   CHOL 104 12/25/2020   HDL 38 (L) 12/25/2020   LDLCALC 47 12/25/2020   TRIG 105 12/25/2020   CHOLHDL 2.7 12/25/2020   He has been working on diet and exercise for T2DM diet controlled with diabetic chronic kidney disease, has been in and out of DM range x 2003, currently with diet and exercise he is in the preDM range, he is on bASA, he is on ACE/ARB, and denies paresthesia of the feet, polydipsia, polyuria and visual disturbances. Last A1C was:  Lab Results  Component Value Date   HGBA1C 6.4 (H) 09/09/2020   Patient is on Vitamin D supplement.   Lab Results  Component Value  Date   VD25OH 137 (H) 09/09/2020     He has stable CKD II associated with T2DM monitored via this office:  Lab Results  Component Value Date   GFRAA 44 (L) 12/25/2020   Patient is on allopurinol for gout and does not report a recent flare.  Lab Results  Component Value Date   LABURIC 3.5 (L) 09/09/2020    Medication Review: Current Outpatient Medications on File Prior to Visit  Medication Sig Dispense Refill  . allopurinol (ZYLOPRIM) 300 MG tablet Take 300 mg by mouth daily.    Marland Kitchen amLODipine (NORVASC) 5 MG tablet Take 1 tablet (5 mg total) by mouth daily. 90 tablet 3  . aspirin 81 MG tablet Take 81 mg by mouth daily.    Marland Kitchen atenolol (TENORMIN) 100 MG tablet Take 1 tablet Daily for BP 90 tablet 3  . Cholecalciferol (VITAMIN D) 125 MCG (5000 UT) CAPS Take 5,000 Units by mouth daily. 30 capsule 0  . escitalopram (LEXAPRO) 20 MG tablet Take 20 mg by mouth daily.    Marland Kitchen losartan-hydrochlorothiazide (HYZAAR) 100-25 MG tablet Take      1 tablet  Daily      for BP 90 tablet 0  . nitroGLYCERIN (NITROSTAT) 0.4 MG SL tablet Place 0.4 mg under the tongue every 5 (five) minutes as needed.      . potassium chloride SA (KLOR-CON) 20 MEQ tablet Take    1 tablet      2 x /day      for Potassium 180 tablet 0  . propranolol (INDERAL) 10 MG tablet Take 10 mg by mouth as needed.    . pyridOXINE (VITAMIN B-6) 50 MG tablet Take 50 mg by mouth daily.      . risperiDONE (RISPERDAL) 2 MG tablet Take 1 mg by mouth at bedtime.    . tamsulosin (FLOMAX) 0.4 MG CAPS capsule Take 0.4 mg by mouth daily.      . traZODone (DESYREL) 150 MG tablet Take      1/3 to 1/2 to 1 tablet          1 hour before Bedtime        needed for Sleep 30 tablet 0  . rosuvastatin (CRESTOR) 40 MG tablet Take 1 tablet (40 mg total) by mouth daily. 30 tablet 12   No current facility-administered medications on file prior to visit.    Allergies: No Known Allergies  Current Problems (verified) has ADENOCARCINOMA, PROSTATE; COLONIC  POLYPS; Hyperlipidemia associated with type 2 diabetes mellitus ([place]); Essential hypertension; Coronary atherosclerosis; RADIATION PROCTITIS; Diverticulosis of colon; Diet-controlled diabetes mellitus ([place]); Medication management; Vitamin D deficiency; PTSD (post-traumatic stress disorder); Obesity (BMI 30.0-34.9); Gout; CKD stage 3 due to type 2 diabetes mellitus ([place]); Insomnia; Murmur; Microalbuminuria due to type 2 diabetes mellitus ([place]); and Hypercalcemia on their problem list.  Screening Tests Immunization History  Administered Date(s) Administered  . Fluad Quad(high Dose 65+) 09/02/2020  . Influenza, High Dose Seasonal PF 09/08/2018, 07/24/2019  . Influenza-Unspecified 08/22/2014, 09/03/2015, 08/25/2016  . Moderna Sars-Covid-2 Vaccination 12/06/2019, 01/03/2020  . Pneumococcal Conjugate-13 09/03/2015  . Pneumococcal-Unspecified 08/25/2016  . Td 11/22/2006   Preventative care: Last colonoscopy: 02/22/2018 due 2022 CXR 2018 Ct head 2013  Prior vaccinations: TD or Tdap: 2008 declines Influenza: 07/2019 Pneumococcal: 2017 Prevnar13: 2016 Shingles/Zostavax: declines  Names of Other Physician/Practitioners you currently use: 1. [facility] here for primary care [facility]. [facility], last visit  June 2018- needs to schedule, reports requested 3. Dr. ?, unknown name, dentist, last visit 2019, q48m  Patient Care Team: [name] Pinto, [name] Colon as PCP - General (Internal Medicine) Minus Breeding, [name] Colon as PCP - Cardiology (Cardiology) Minus Breeding, [name] Colon as Consulting Physician (Cardiology) Irene Shipper, [name] Colon as Consulting Physician (Gastroenterology) Kathie Rhodes, [name] Colon (Inactive) as Consulting Physician (Urology)  Surgical: He  has a past surgical history that includes Coronary angioplasty with stent (04/2008); Colonoscopy w/ polypectomy; Mass excision (Left, 10/06/2017); and Colonoscopy. Family His family history includes Heart disease in his  father; Hypertension in his father; Hypotension in his mother; Kidney disease in his brother. Social history  He reports that he quit smoking about 28 years ago. He has never used smokeless tobacco. He reports that he does not drink alcohol and does not use drugs.  MEDICARE WELLNESS OBJECTIVES: Physical activity: Current Exercise Habits: The patient does not participate in regular exercise at present, Exercise limited by: None identified Cardiac risk factors: Cardiac Risk Factors include: advanced age (>61men, >73 women);dyslipidemia;hypertension;diabetes mellitus;obesity (BMI >30kg/m2);sedentary lifestyle Depression/mood screen:   Depression screen [facility] 2/9 12/25/2020  Decreased Interest -  Down, Depressed, Hopeless 0  PHQ - 2  Score 0    ADLs:  In your present state of health, do you have any difficulty performing the following activities: 12/25/2020 09/08/2020  Hearing? N N  Comment - -  Vision? N N  Difficulty concentrating or making decisions? N N  Walking or climbing stairs? N N  Dressing or bathing? N N  Doing errands, shopping? N N  Preparing Food and eating ? N -  Using the Toilet? N -  In the past six months, have you accidently leaked urine? N -  Do you have problems with loss of bowel control? N -  Managing your Medications? N -  Managing your Finances? N -  Housekeeping or managing your Housekeeping? N -  Some recent data might be hidden     Cognitive Testing  Alert? Yes  Normal Appearance?Yes  Oriented to person? Yes  Place? Yes   Time? Yes  Recall of three objects?  Yes  Can perform simple calculations? Yes  Displays appropriate judgment?Yes  Can read the correct time from a watch face?Yes  EOL planning: Does Patient Have a Medical Advance Directive?: No Would patient like information on creating a medical advance directive?: Yes (MAU/Ambulatory/Procedural Areas - Information given)   Objective:   Today's Vitals   12/25/20 0929  BP: 126/80  Pulse: (!) 50   Temp: 97.6 F (36.4 C)  SpO2: 98%  Weight: 206 lb (93.4 kg)  Height: 5\' 9"  (1.753 m)  PainSc: 0-No pain   Body mass index is 30.42 kg/m.  General appearance: alert, no distress, WD/WN, male HEENT: normocephalic, sclerae anicteric, TMs pearly, nares patent, no discharge or erythema, pharynx normal. Mildly HOH.  Oral cavity: MMM, no lesions Neck: supple, no lymphadenopathy, no thyromegaly, no masses Heart: RRR, normal S1, S2, Mild 2/6 systolic murmur without radiation best heard R 2nd ICS Lungs: CTA bilaterally, + worse RLL but bilateral wheezes, but no rhonchi, or rales Abdomen: +bs, obese,  soft, non tender, non distended, no masses, no hepatomegaly, no splenomegaly Musculoskeletal: nontender, no swelling, no obvious deformity, slow antalgic gait Extremities: 1+ edema, no cyanosis, no clubbing Pulses: 2+ symmetric, upper and lower extremities, normal cap refill Neurological: alert, oriented x 3, CN2-12 intact, strength normal upper extremities and lower extremities, sensation normal throughout, DTRs 2+ throughout, no cerebellar signs, gait slow antalgic Psychiatric: normal affect, behavior normal, pleasant   Medicare Attestation I have personally reviewed: The patient's medical and social history Their use of alcohol, tobacco or illicit drugs Their current medications and supplements The patient's functional ability including ADLs,fall risks, home safety risks, cognitive, and hearing and visual impairment Diet and physical activities Evidence for depression or mood disorders  The patient's weight, height, BMI, and visual acuity have been recorded in the chart.  I have made referrals, counseling, and provided education to the patient based on review of the above and I have provided the patient with a written personalized care plan for preventive services.     [name] Sierras, [name] Colon   12/25/2020

## 2020-12-26 LAB — COMPLETE METABOLIC PANEL WITH GFR
AG Ratio: 1.7 (calc) (ref 1.0–2.5)
ALT: 19 U/L (ref 9–46)
AST: 22 U/L (ref 10–35)
Albumin: 4.4 g/dL (ref 3.6–5.1)
Alkaline phosphatase (APISO): 62 U/L (ref 35–144)
BUN/Creatinine Ratio: 13 (calc) (ref 6–22)
BUN: 22 mg/dL (ref 7–25)
CO2: 27 mmol/L (ref 20–32)
Calcium: 10.8 mg/dL — ABNORMAL HIGH (ref 8.6–10.3)
Chloride: 102 mmol/L (ref 98–110)
Creat: 1.66 mg/dL — ABNORMAL HIGH (ref 0.70–1.11)
GFR, Est African American: 44 mL/min/{1.73_m2} — ABNORMAL LOW (ref >=60)
GFR, Est Non African American: 38 mL/min/{1.73_m2} — ABNORMAL LOW (ref >=60)
Globulin: 2.6 g/dL (calc) (ref 1.9–3.7)
Glucose, Bld: 125 mg/dL — ABNORMAL HIGH (ref 65–99)
Potassium: 3.7 mmol/L (ref 3.5–5.3)
Sodium: 141 mmol/L (ref 135–146)
Total Bilirubin: 0.4 mg/dL (ref 0.2–1.2)
Total Protein: 7 g/dL (ref 6.1–8.1)

## 2020-12-26 LAB — CBC WITH DIFFERENTIAL/PLATELET
Absolute Monocytes: 469 cells/uL (ref 200–950)
Basophils Absolute: 32 cells/uL (ref 0–200)
Basophils Relative: 0.7 %
Eosinophils Absolute: 101 cells/uL (ref 15–500)
Eosinophils Relative: 2.2 %
HCT: 46.2 % (ref 38.5–50.0)
Hemoglobin: 14.8 g/dL (ref 13.2–17.1)
Lymphs Abs: 1716 cells/uL (ref 850–3900)
MCH: 25.9 pg — ABNORMAL LOW (ref 27.0–33.0)
MCHC: 32 g/dL (ref 32.0–36.0)
MCV: 80.9 fL (ref 80.0–100.0)
MPV: 10.3 fL (ref 7.5–12.5)
Monocytes Relative: 10.2 %
Neutro Abs: 2282 cells/uL (ref 1500–7800)
Neutrophils Relative %: 49.6 %
Platelets: 204 10*3/uL (ref 140–400)
RBC: 5.71 10*6/uL (ref 4.20–5.80)
RDW: 15 % (ref 11.0–15.0)
Total Lymphocyte: 37.3 %
WBC: 4.6 10*3/uL (ref 3.8–10.8)

## 2020-12-26 LAB — LIPID PANEL
Cholesterol: 104 mg/dL (ref <200)
HDL: 38 mg/dL — ABNORMAL LOW (ref >=40)
LDL Cholesterol (Calc): 47 mg/dL (calc)
Non-HDL Cholesterol (Calc): 66 mg/dL (calc) (ref <130)
Total CHOL/HDL Ratio: 2.7 (calc) (ref <5.0)
Triglycerides: 105 mg/dL (ref <150)

## 2021-01-02 ENCOUNTER — Other Ambulatory Visit: Payer: Self-pay | Admitting: Cardiology

## 2021-01-03 LAB — COMPREHENSIVE METABOLIC PANEL
ALT: 17 IU/L (ref 0–44)
AST: 22 IU/L (ref 0–40)
Albumin/Globulin Ratio: 1.9 (ref 1.2–2.2)
Albumin: 4.7 g/dL (ref 3.7–4.7)
Alkaline Phosphatase: 67 IU/L (ref 44–121)
BUN/Creatinine Ratio: 12 (ref 10–24)
BUN: 17 mg/dL (ref 8–27)
Bilirubin Total: 0.4 mg/dL (ref 0.0–1.2)
CO2: 23 mmol/L (ref 20–29)
Calcium: 10.6 mg/dL — ABNORMAL HIGH (ref 8.6–10.2)
Chloride: 103 mmol/L (ref 96–106)
Creatinine, Ser: 1.41 mg/dL — ABNORMAL HIGH (ref 0.76–1.27)
GFR calc Af Amer: 54 mL/min/{1.73_m2} — ABNORMAL LOW (ref >59)
GFR calc non Af Amer: 47 mL/min/{1.73_m2} — ABNORMAL LOW (ref >59)
Globulin, Total: 2.5 g/dL (ref 1.5–4.5)
Glucose: 125 mg/dL — ABNORMAL HIGH (ref 65–99)
Potassium: 4.1 mmol/L (ref 3.5–5.2)
Sodium: 144 mmol/L (ref 134–144)
Total Protein: 7.2 g/dL (ref 6.0–8.5)

## 2021-01-03 LAB — LIPID PANEL
Chol/HDL Ratio: 2.6 ratio (ref 0.0–5.0)
Cholesterol, Total: 111 mg/dL (ref 100–199)
HDL: 42 mg/dL (ref >39)
LDL Chol Calc (NIH): 54 mg/dL (ref 0–99)
Triglycerides: 70 mg/dL (ref 0–149)
VLDL Cholesterol Cal: 15 mg/dL (ref 5–40)

## 2021-01-06 ENCOUNTER — Other Ambulatory Visit: Payer: Self-pay | Admitting: Nephrology

## 2021-01-06 DIAGNOSIS — N281 Cyst of kidney, acquired: Secondary | ICD-10-CM

## 2021-01-28 ENCOUNTER — Ambulatory Visit
Admission: RE | Admit: 2021-01-28 | Discharge: 2021-01-28 | Disposition: A | Discharge status: Home / Self Care | Payer: Medicare Other | Source: Ambulatory Visit | Attending: Nephrology | Admitting: Nephrology

## 2021-01-28 ENCOUNTER — Other Ambulatory Visit: Payer: Self-pay

## 2021-01-28 DIAGNOSIS — N281 Cyst of kidney, acquired: Secondary | ICD-10-CM

## 2021-01-28 IMAGING — MR MR ABDOMEN WO/W CM
10 of 15 series · 25 of 48 positions shown · IV contrast (multihance)
Comparison: [DATE]
COMPARISON: [DATE]

Addendum:
CLINICAL DATA: Characterize suspected mass of the midportion of the
right kidney identified by prior ultrasound

EXAM:
MRI ABDOMEN WITHOUT AND WITH CONTRAST
TECHNIQUE: Multiplanar multisequence MR imaging of the abdomen was performed
both before and after the administration of intravenous contrast.
CONTRAST:  18mL MULTIHANCE GADOBENATE DIMEGLUMINE 529 MG/ML IV SOLN

[Series 3: T2 · coronal · 5.0mm · 1.56mm/px · 1 of 20 slices shown (1 of 3)]
[im 1/20]
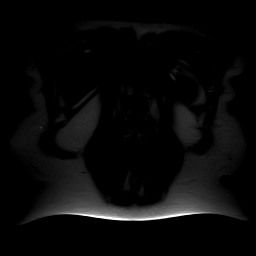

[Series 4: axial tru fisp · axial · 4.0mm · 1.48mm/px · 1 of 30 slices shown]
[im 1/30]
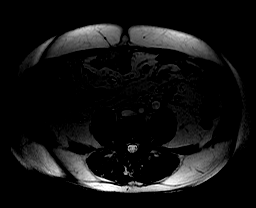

[Series 5: [id number]_500_[id number] · axial · 6.0mm · 1.98mm/px · z∈[-96,+131]mm · 5 of 90 slices shown]
[im 1/90]
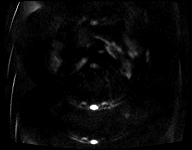
[im 23/90]
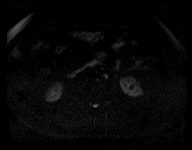
[im 45/90]
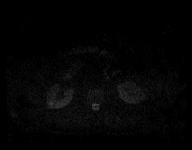
[im 67/90]
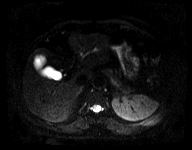
[im 90/90]
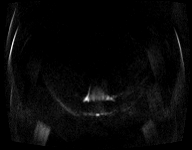

[Series 6: [id number]_adc · axial · 6.0mm · 1.98mm/px · 1 of 30 slices shown]
[im 1/30]
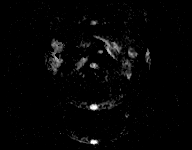

[Series 7: T2 · axial · 6.5mm · 0.74mm/px · z∈[-88,+123]mm · 2 of 28 slices shown (2 of 3)]
[im 1/28]
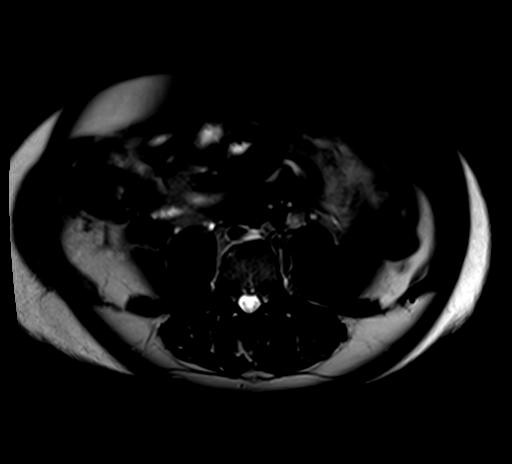
[im 28/28]
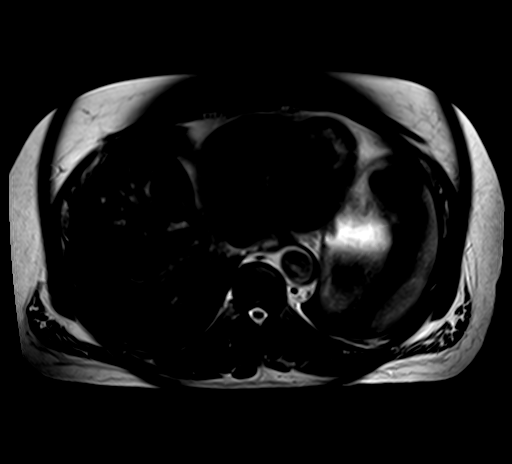

[Series 8: T2 · axial · 5.0mm · 1.37mm/px · z∈[-100,+89]mm · 2 of 30 slices shown (3 of 3)]
[im 1/30]
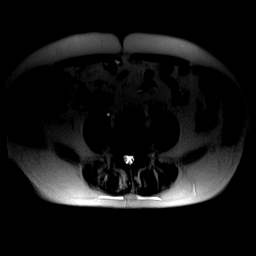
[im 30/30]
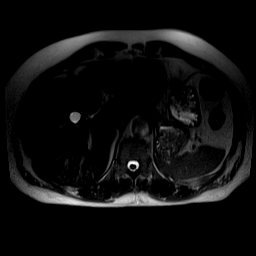

[Series 10: T1 dynamic · axial · non-contrast · 2.5mm · 0.78mm/px · z∈[-79,+68]mm · 4 of 60 slices shown]
[im 1/60]
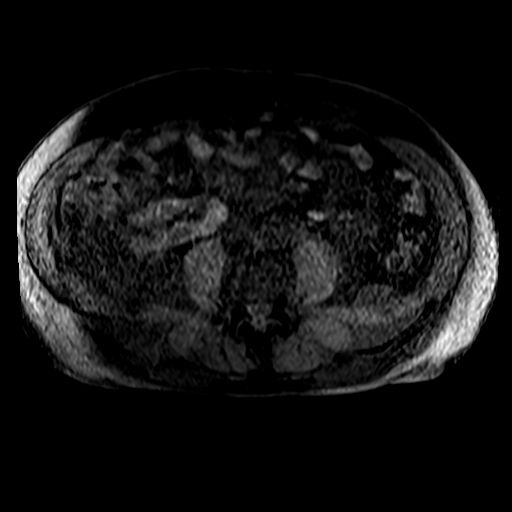
[im 20/60]
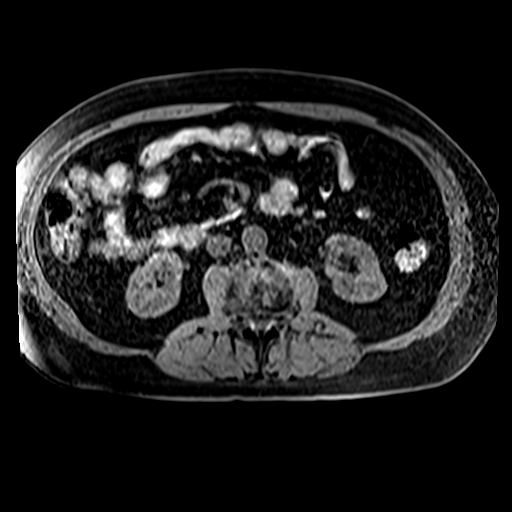
[im 40/60]
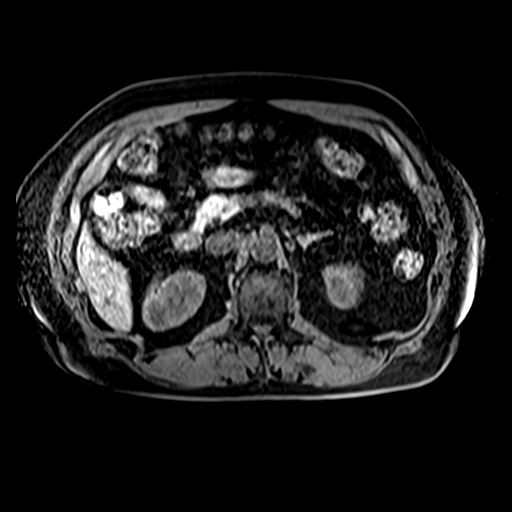
[im 60/60]
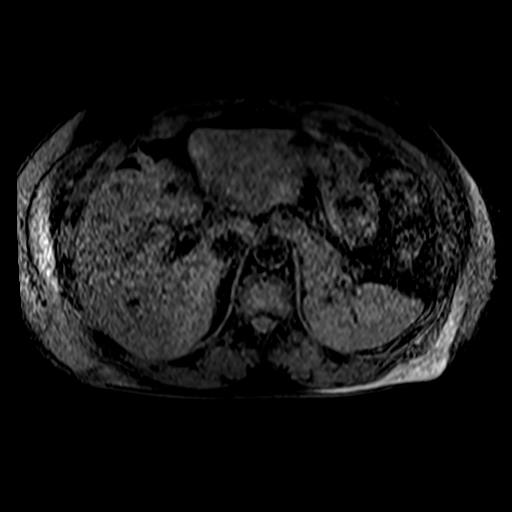

[Series 11: post 25 sec · axial · 2.5mm · 0.78mm/px · z∈[-79,+68]mm · 4 of 60 slices shown]
[im 1/60]
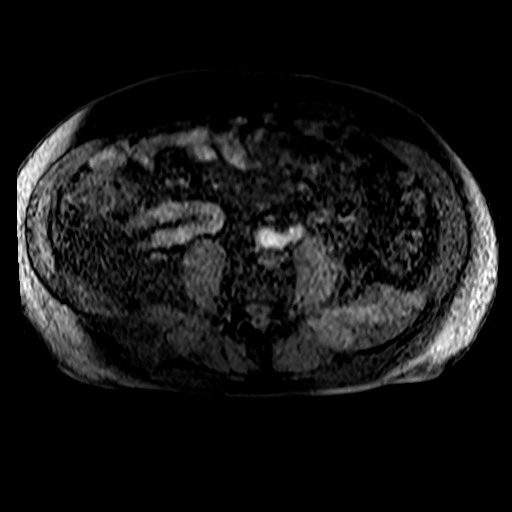
[im 20/60]
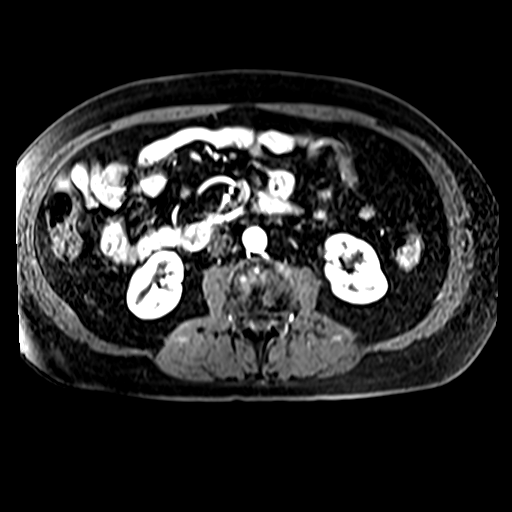
[im 40/60]
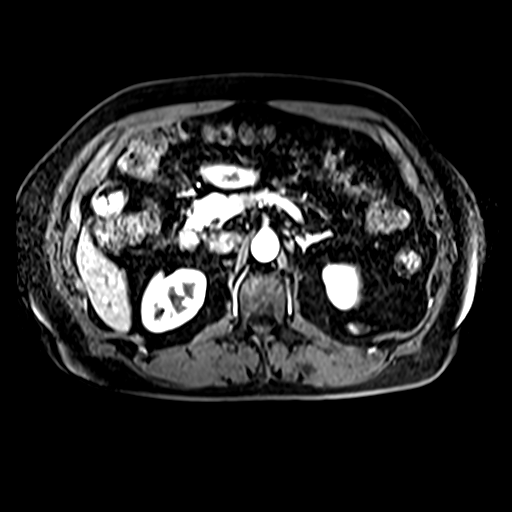
[im 60/60]
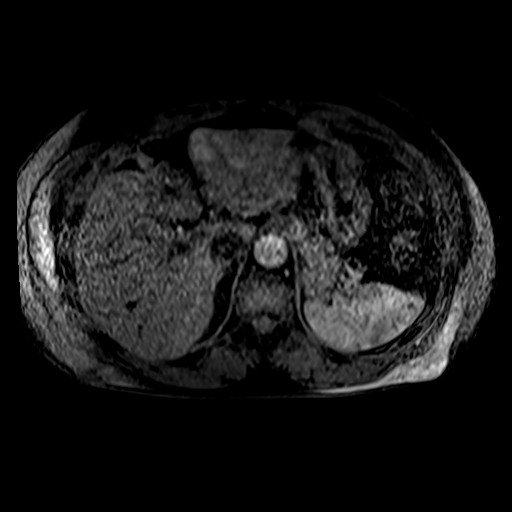

[Series 12: post 25 sec_sub · axial · 2.5mm · 0.78mm/px · z∈[-79,+68]mm · 4 of 60 slices shown]
[im 1/60]
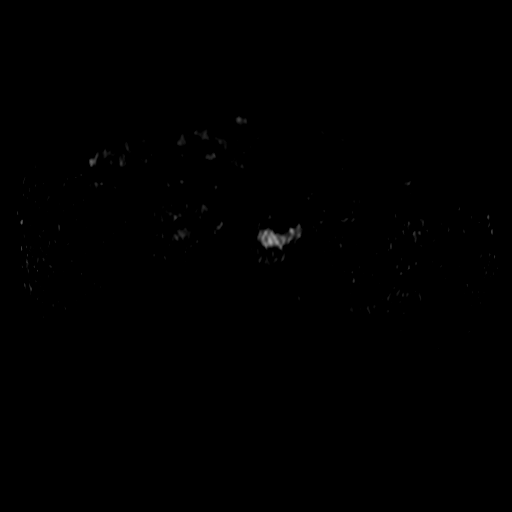
[im 20/60]
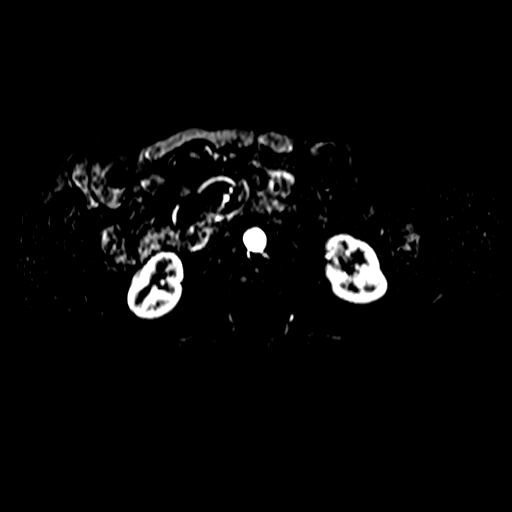
[im 40/60]
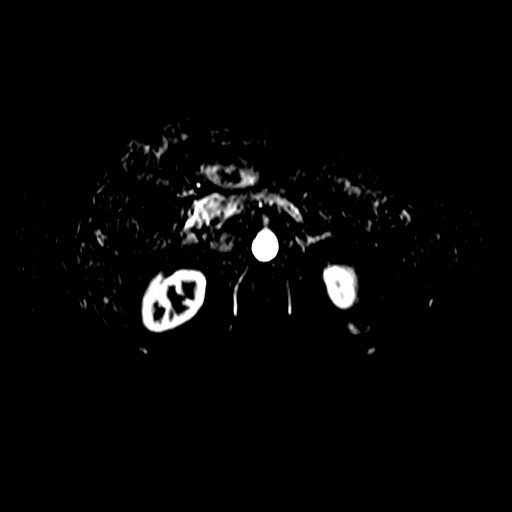
[im 60/60]
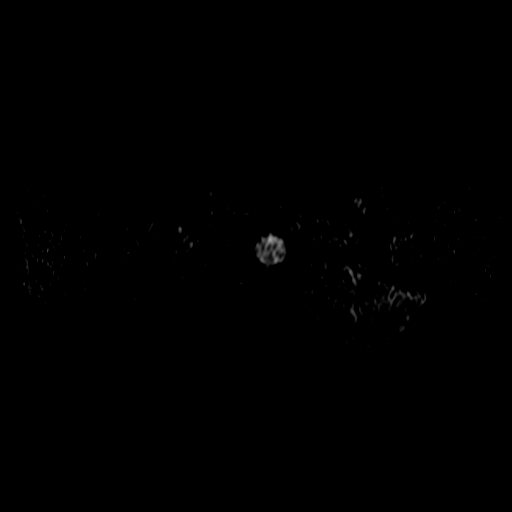

[Series 13: post 45 sec · axial · 2.5mm · 0.78mm/px · 1 of 60 slices shown]
[im 1/60]
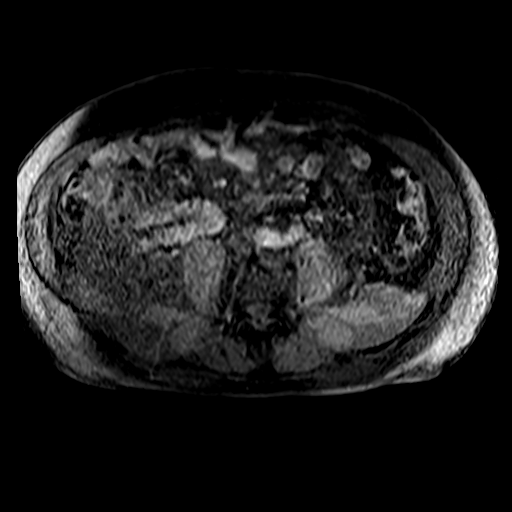

[25 of 48 positions shown; findings below may reference images not displayed]

FINDINGS: Lower chest: No acute findings.

Hepatobiliary: No mass or other parenchymal abnormality identified.
Numerous gallstones in the gallbladder. No biliary ductal dilatation

Pancreas: No mass, inflammatory changes, or other parenchymal
abnormality identified.

Spleen:  Within normal limits in size and appearance.

Adrenals/Urinary Tract: There is a partially exophytic mixed solid
and cystic lesion of the lateral midportion of the right kidney
measuring 1.9 x 1.8 cm, with a peripheral contrast enhancing nodule
measuring approximately 9 mm (series 18, image 33). No evidence of
hydronephrosis.

Stomach/Bowel: Visualized portions within the abdomen are
unremarkable.

Vascular/Lymphatic: No pathologically enlarged lymph nodes
identified. No abdominal aortic aneurysm demonstrated.

Other:  None.

Musculoskeletal: No suspicious bone lesions identified.
IMPRESSION: 1. There is a partially exophytic mixed solid and cystic lesion of
the lateral midportion of the right kidney measuring 1.9 x 1.8 cm,
with a peripheral contrast enhancing nodule measuring approximately
9 mm. This is highly suspicious for renal cell carcinoma.
2. No evidence of renal vein invasion, lymphadenopathy, or
metastatic disease in the abdomen.
3. Cholelithiasis.

ADDENDUM:
The 1.9 cm lateral right kidney midpole exophytic lesion is
predominantly cystic. Heterogeneous signal intensity across several
sequences favors internal hemorrhage. Mural nodule enhancement is
not definitive upon second review. There is also a previous
noncontrast CT from [DATE] at which time the lesion was
hyperdense by CT which also favors a hemorrhagic cyst. Therefore,
this lesion is consistent with a Bosniak 2 F lesion that warrants
imaging follow-up.

*** End of Addendum ***
FINDINGS: Lower chest: No acute findings.

Hepatobiliary: No mass or other parenchymal abnormality identified.
Numerous gallstones in the gallbladder. No biliary ductal dilatation

Pancreas: No mass, inflammatory changes, or other parenchymal
abnormality identified.

Spleen:  Within normal limits in size and appearance.

Adrenals/Urinary Tract: There is a partially exophytic mixed solid
and cystic lesion of the lateral midportion of the right kidney
measuring 1.9 x 1.8 cm, with a peripheral contrast enhancing nodule
measuring approximately 9 mm (series 18, image 33). No evidence of
hydronephrosis.

Stomach/Bowel: Visualized portions within the abdomen are
unremarkable.

Vascular/Lymphatic: No pathologically enlarged lymph nodes
identified. No abdominal aortic aneurysm demonstrated.

Other:  None.

Musculoskeletal: No suspicious bone lesions identified.
IMPRESSION: 1. There is a partially exophytic mixed solid and cystic lesion of
the lateral midportion of the right kidney measuring 1.9 x 1.8 cm,
with a peripheral contrast enhancing nodule measuring approximately
9 mm. This is highly suspicious for renal cell carcinoma.
2. No evidence of renal vein invasion, lymphadenopathy, or
metastatic disease in the abdomen.
3. Cholelithiasis.

## 2021-01-28 MED ORDER — GADOBENATE DIMEGLUMINE 529 MG/ML IV SOLN
18.0000 mL | Freq: Once | INTRAVENOUS | Status: AC | PRN
  Administered 2021-01-28: 18 mL via INTRAVENOUS

## 2021-02-18 ENCOUNTER — Other Ambulatory Visit: Payer: Self-pay | Admitting: Internal Medicine

## 2021-03-03 ENCOUNTER — Encounter: Payer: Medicare Other | Admitting: Internal Medicine

## 2021-03-10 ENCOUNTER — Other Ambulatory Visit: Payer: Self-pay | Admitting: Adult Health Nurse Practitioner

## 2021-03-10 DIAGNOSIS — F5101 Primary insomnia: Secondary | ICD-10-CM

## 2021-03-25 ENCOUNTER — Encounter (HOSPITAL_COMMUNITY): Payer: Self-pay

## 2021-03-25 ENCOUNTER — Emergency Department (HOSPITAL_COMMUNITY): Payer: Medicare Other

## 2021-03-25 ENCOUNTER — Inpatient Hospital Stay (HOSPITAL_COMMUNITY)
Admission: EM | Admit: 2021-03-25 | Discharge: 2021-03-27 | DRG: 312 | Disposition: A | Discharge status: Home / Self Care | Payer: Medicare Other | Attending: Internal Medicine | Admitting: Internal Medicine

## 2021-03-25 DIAGNOSIS — E1169 Type 2 diabetes mellitus with other specified complication: Secondary | ICD-10-CM | POA: Diagnosis present

## 2021-03-25 DIAGNOSIS — E1122 Type 2 diabetes mellitus with diabetic chronic kidney disease: Secondary | ICD-10-CM | POA: Diagnosis present

## 2021-03-25 DIAGNOSIS — Z79891 Long term (current) use of opiate analgesic: Secondary | ICD-10-CM

## 2021-03-25 DIAGNOSIS — Z7982 Long term (current) use of aspirin: Secondary | ICD-10-CM

## 2021-03-25 DIAGNOSIS — Z955 Presence of coronary angioplasty implant and graft: Secondary | ICD-10-CM

## 2021-03-25 DIAGNOSIS — I129 Hypertensive chronic kidney disease with stage 1 through stage 4 chronic kidney disease, or unspecified chronic kidney disease: Secondary | ICD-10-CM | POA: Diagnosis present

## 2021-03-25 DIAGNOSIS — M109 Gout, unspecified: Secondary | ICD-10-CM | POA: Diagnosis present

## 2021-03-25 DIAGNOSIS — N1831 Chronic kidney disease, stage 3a: Secondary | ICD-10-CM | POA: Diagnosis not present

## 2021-03-25 DIAGNOSIS — R001 Bradycardia, unspecified: Secondary | ICD-10-CM | POA: Diagnosis present

## 2021-03-25 DIAGNOSIS — E876 Hypokalemia: Secondary | ICD-10-CM | POA: Diagnosis present

## 2021-03-25 DIAGNOSIS — E785 Hyperlipidemia, unspecified: Secondary | ICD-10-CM

## 2021-03-25 DIAGNOSIS — E119 Type 2 diabetes mellitus without complications: Secondary | ICD-10-CM

## 2021-03-25 DIAGNOSIS — I10 Essential (primary) hypertension: Secondary | ICD-10-CM | POA: Diagnosis not present

## 2021-03-25 DIAGNOSIS — M1990 Unspecified osteoarthritis, unspecified site: Secondary | ICD-10-CM | POA: Diagnosis present

## 2021-03-25 DIAGNOSIS — R55 Syncope and collapse: Secondary | ICD-10-CM | POA: Diagnosis not present

## 2021-03-25 DIAGNOSIS — Z87891 Personal history of nicotine dependence: Secondary | ICD-10-CM

## 2021-03-25 DIAGNOSIS — Z79899 Other long term (current) drug therapy: Secondary | ICD-10-CM

## 2021-03-25 DIAGNOSIS — Z8546 Personal history of malignant neoplasm of prostate: Secondary | ICD-10-CM

## 2021-03-25 DIAGNOSIS — N400 Enlarged prostate without lower urinary tract symptoms: Secondary | ICD-10-CM | POA: Diagnosis present

## 2021-03-25 DIAGNOSIS — F4310 Post-traumatic stress disorder, unspecified: Secondary | ICD-10-CM | POA: Diagnosis present

## 2021-03-25 DIAGNOSIS — E559 Vitamin D deficiency, unspecified: Secondary | ICD-10-CM | POA: Diagnosis present

## 2021-03-25 DIAGNOSIS — Z841 Family history of disorders of kidney and ureter: Secondary | ICD-10-CM

## 2021-03-25 DIAGNOSIS — I2510 Atherosclerotic heart disease of native coronary artery without angina pectoris: Secondary | ICD-10-CM | POA: Diagnosis present

## 2021-03-25 DIAGNOSIS — N183 Chronic kidney disease, stage 3 unspecified: Secondary | ICD-10-CM

## 2021-03-25 DIAGNOSIS — Z8249 Family history of ischemic heart disease and other diseases of the circulatory system: Secondary | ICD-10-CM

## 2021-03-25 DIAGNOSIS — Z20822 Contact with and (suspected) exposure to covid-19: Secondary | ICD-10-CM | POA: Diagnosis present

## 2021-03-25 LAB — BASIC METABOLIC PANEL
Anion gap: 5 (ref 5–15)
BUN: 20 mg/dL (ref 8–23)
CO2: 27 mmol/L (ref 22–32)
Calcium: 9.8 mg/dL (ref 8.9–10.3)
Chloride: 106 mmol/L (ref 98–111)
Creatinine, Ser: 1.45 mg/dL — ABNORMAL HIGH (ref 0.61–1.24)
GFR, Estimated: 49 mL/min — ABNORMAL LOW (ref >60)
Glucose, Bld: 121 mg/dL — ABNORMAL HIGH (ref 70–99)
Potassium: 5 mmol/L (ref 3.5–5.1)
Sodium: 138 mmol/L (ref 135–145)

## 2021-03-25 LAB — CBC WITH DIFFERENTIAL/PLATELET
Abs Immature Granulocytes: 0.01 10*3/uL (ref 0.00–0.07)
Basophils Absolute: 0 10*3/uL (ref 0.0–0.1)
Basophils Relative: 0 %
Eosinophils Absolute: 0.1 10*3/uL (ref 0.0–0.5)
Eosinophils Relative: 1 %
HCT: 47 % (ref 39.0–52.0)
Hemoglobin: 14.4 g/dL (ref 13.0–17.0)
Immature Granulocytes: 0 %
Lymphocytes Relative: 38 %
Lymphs Abs: 1.9 10*3/uL (ref 0.7–4.0)
MCH: 25.1 pg — ABNORMAL LOW (ref 26.0–34.0)
MCHC: 30.6 g/dL (ref 30.0–36.0)
MCV: 81.9 fL (ref 80.0–100.0)
Monocytes Absolute: 0.6 10*3/uL (ref 0.1–1.0)
Monocytes Relative: 11 %
Neutro Abs: 2.5 10*3/uL (ref 1.7–7.7)
Neutrophils Relative %: 50 %
Platelets: 162 10*3/uL (ref 150–400)
RBC: 5.74 MIL/uL (ref 4.22–5.81)
RDW: 17 % — ABNORMAL HIGH (ref 11.5–15.5)
WBC: 5.1 10*3/uL (ref 4.0–10.5)
nRBC: 0 % (ref 0.0–0.2)

## 2021-03-25 LAB — SARS CORONAVIRUS 2 (TAT 6-24 HRS): SARS Coronavirus 2: NEGATIVE

## 2021-03-25 LAB — TSH: TSH: 2.96 u[IU]/mL (ref 0.350–4.500)

## 2021-03-25 IMAGING — CT CT HEAD W/O CM
4 series · 16 of 47 positions shown, 18 images · non-contrast
Comparison: CT head [DATE].

CLINICAL DATA: Dizziness, nonspecific.  Found down.

EXAM:
CT HEAD WITHOUT CONTRAST
TECHNIQUE: Contiguous axial images were obtained from the base of the skull
through the vertex without intravenous contrast.

[Series 3: head wo · axial · 0.45mm/px · z∈[+1520,+1634]mm · 7 of 31 slices shown, 9 images]
[im 4/31  brain]
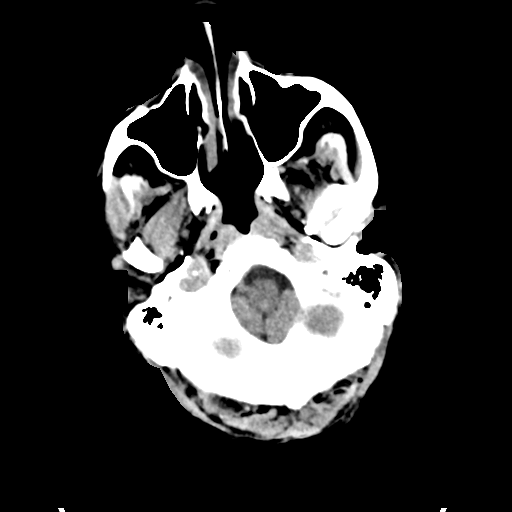
[im 4/31  bone]
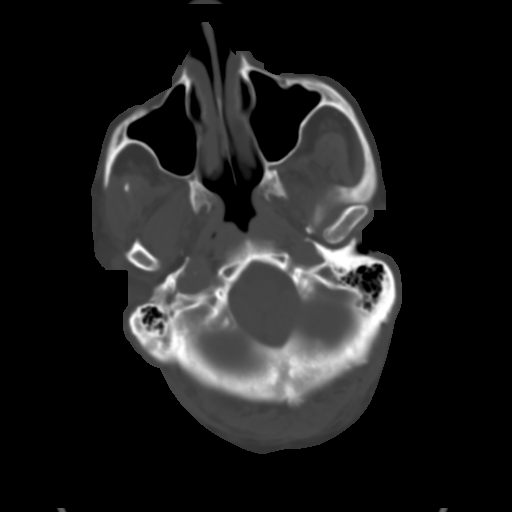
[im 8/31  brain]
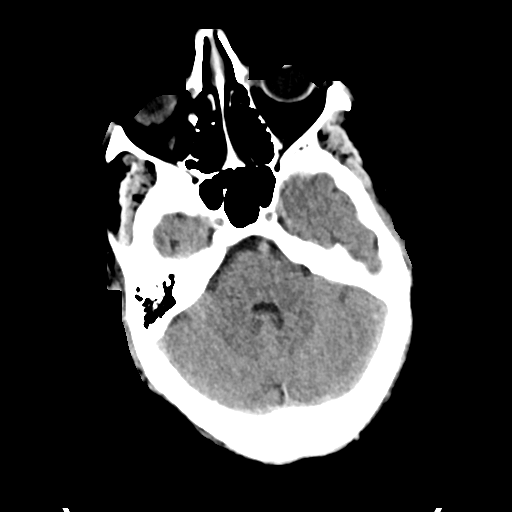
[im 12/31  brain]
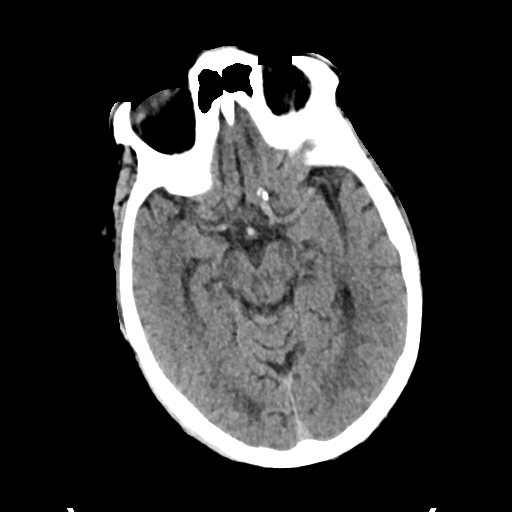
[im 16/31  brain]
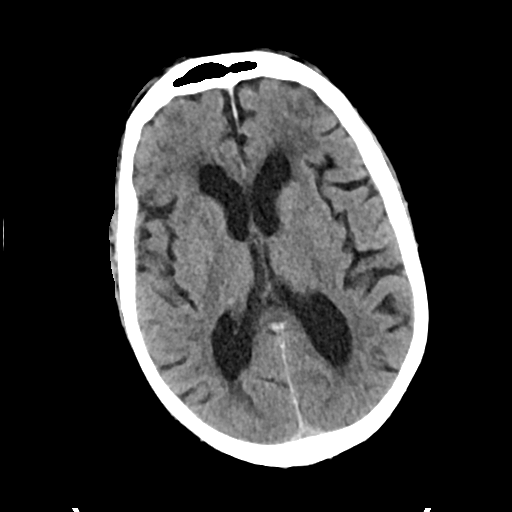
[im 19/31  brain]
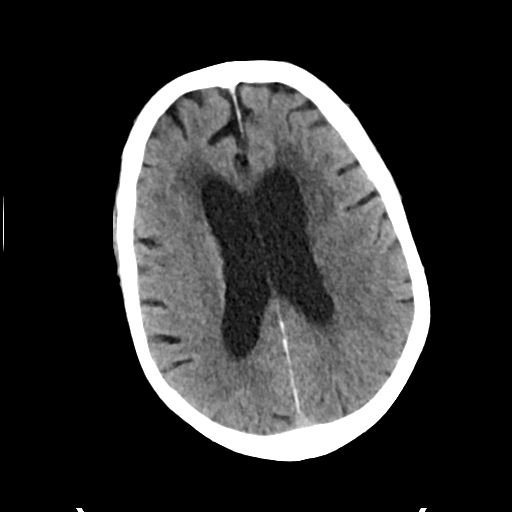
[im 19/31  bone]
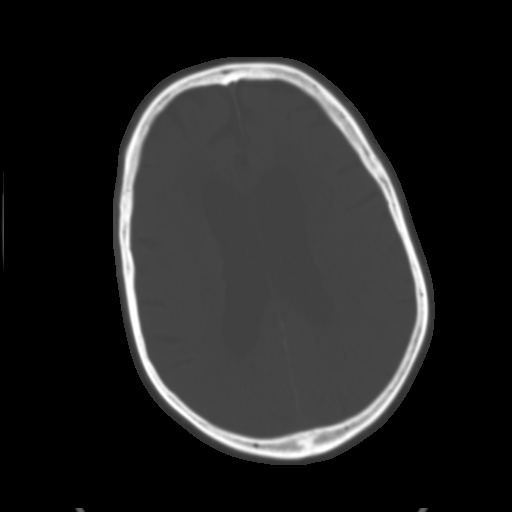
[im 23/31  brain]
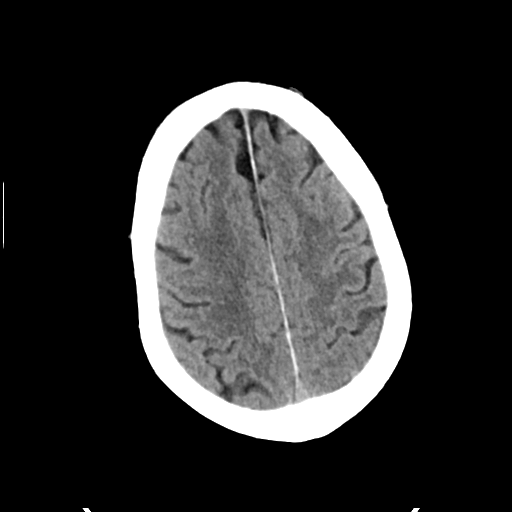
[im 27/31  brain]
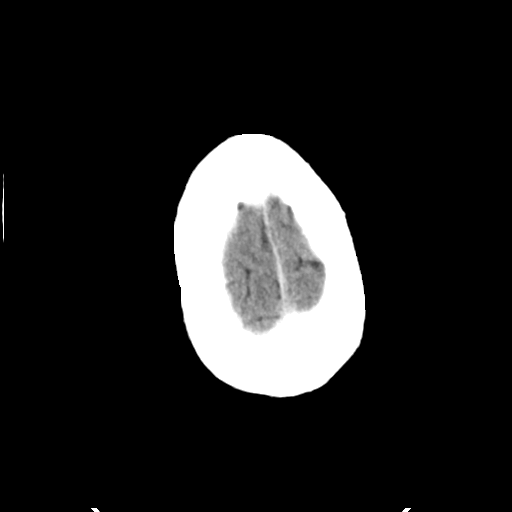

[Series 4: head bone · axial · 0.45mm/px · z∈[+1518,+1548]mm · 3 of 77 slices shown]
[im 8/77  bone]
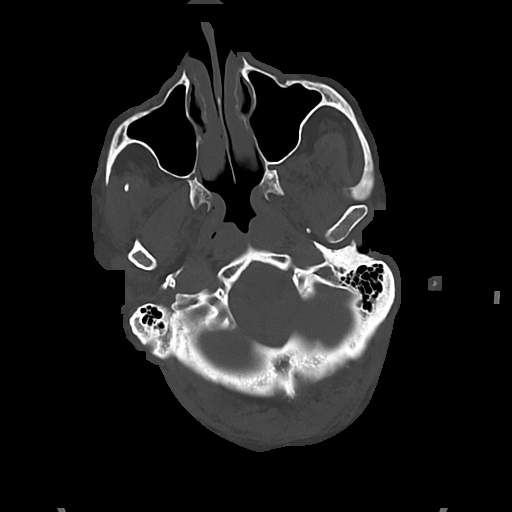
[im 16/77  bone]
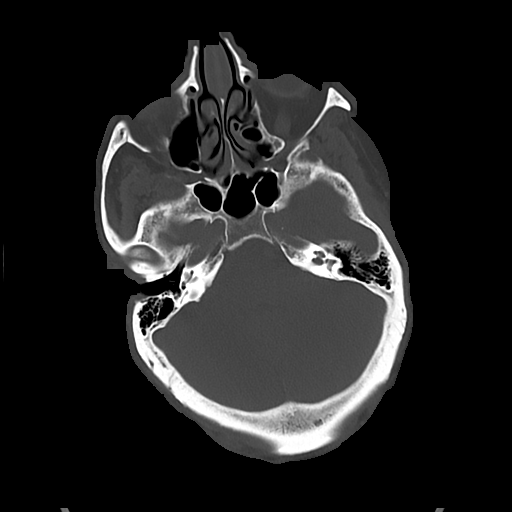
[im 23/77  bone]
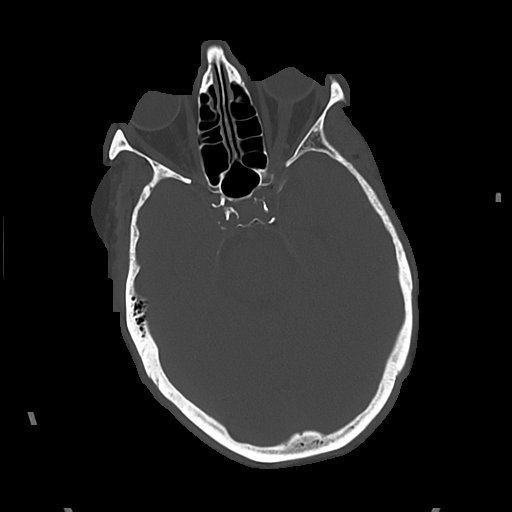

[Series 5: cor soft · coronal · 0.31mm/px · 3 of 68 slices shown]
[im 23/68  brain]
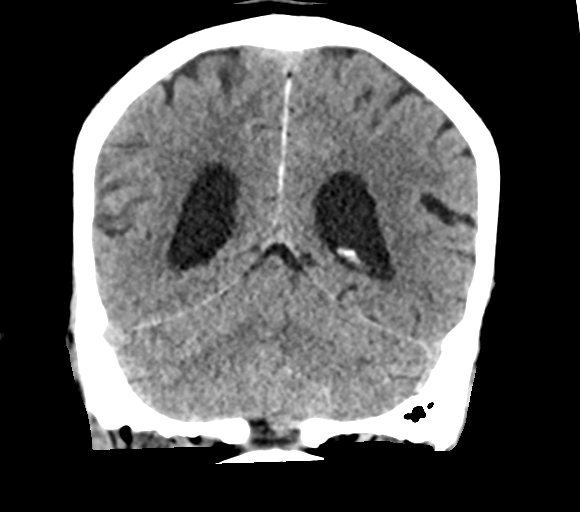
[im 30/68  brain]
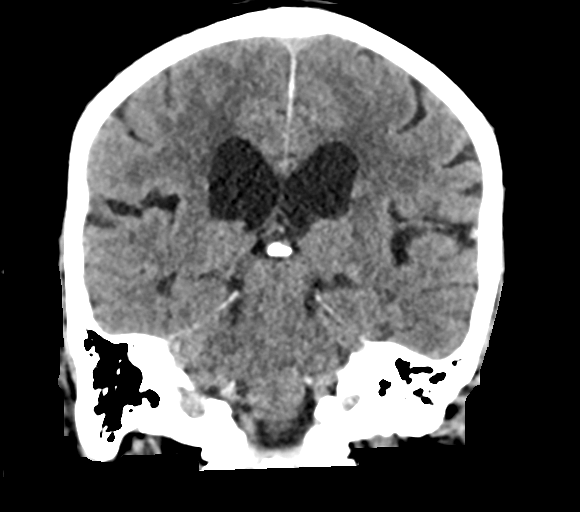
[im 38/68  brain]
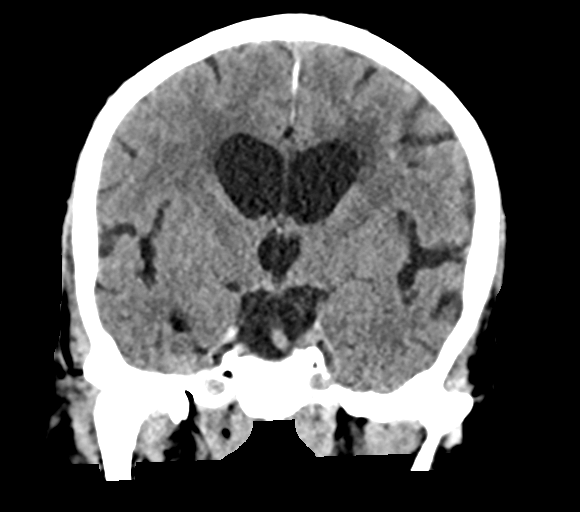

[Series 6: sag soft · sagittal · 0.31mm/px · 3 of 52 slices shown]
[im 18/52  brain]
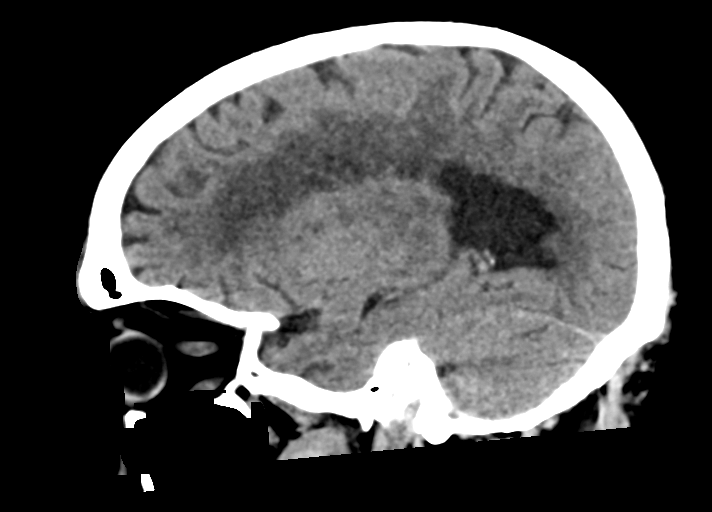
[im 26/52  brain]
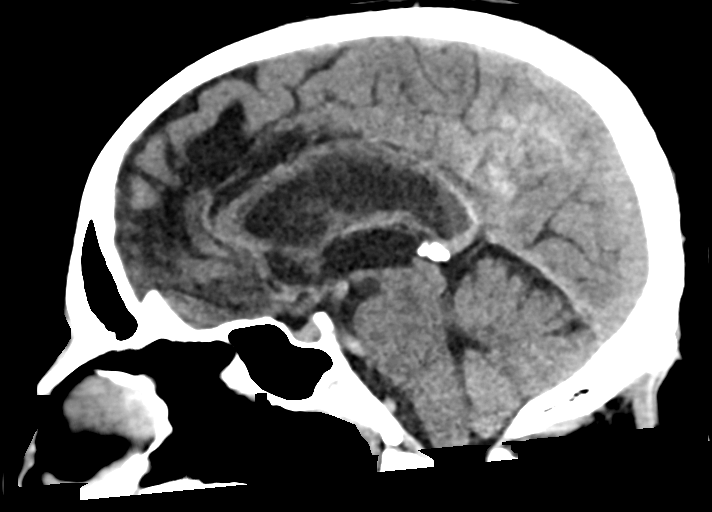
[im 35/52  brain]
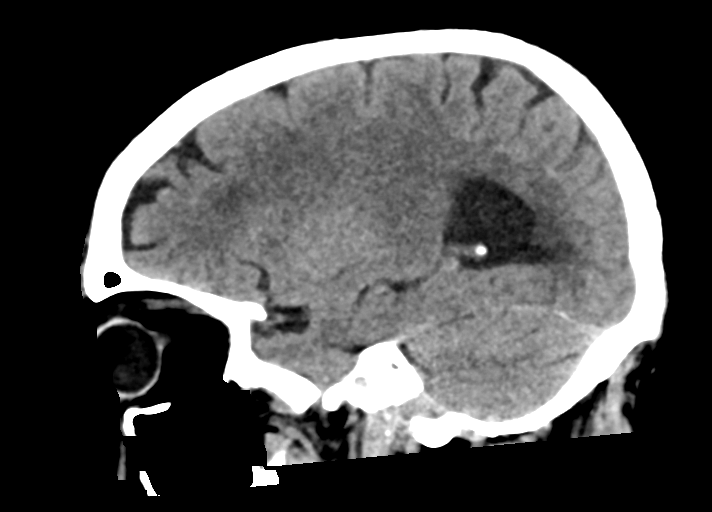

[16 of 47 positions shown; findings below may reference images not displayed]

FINDINGS: Brain: No evidence of acute large vascular territory infarction,
hemorrhage, hydrocephalus, extra-axial collection or mass
lesion/mass effect. Generalized cerebral atrophy with ex vacuo
ventricular dilation. Mild patchy hypodensities in the white matter,
likely related to chronic microvascular ischemic disease.

Vascular: Calcific atherosclerosis. No hyperdense vessel identified.

Skull: No acute fracture.

Sinuses/Orbits: Visualized sinuses are clear.

Other: No mastoid effusions.
IMPRESSION: 1. No evidence of acute intracranial abnormality.
2. Atrophy and chronic microvascular ischemic disease.

## 2021-03-25 IMAGING — DX DG CHEST 2V
2 series · 2 of 2 positions shown · non-contrast
Comparison: [DATE].

CLINICAL DATA: Syncopal episode today. History of hypertension and
prostate carcinoma.

EXAM:
CHEST - 2 VIEW

[chest lat]
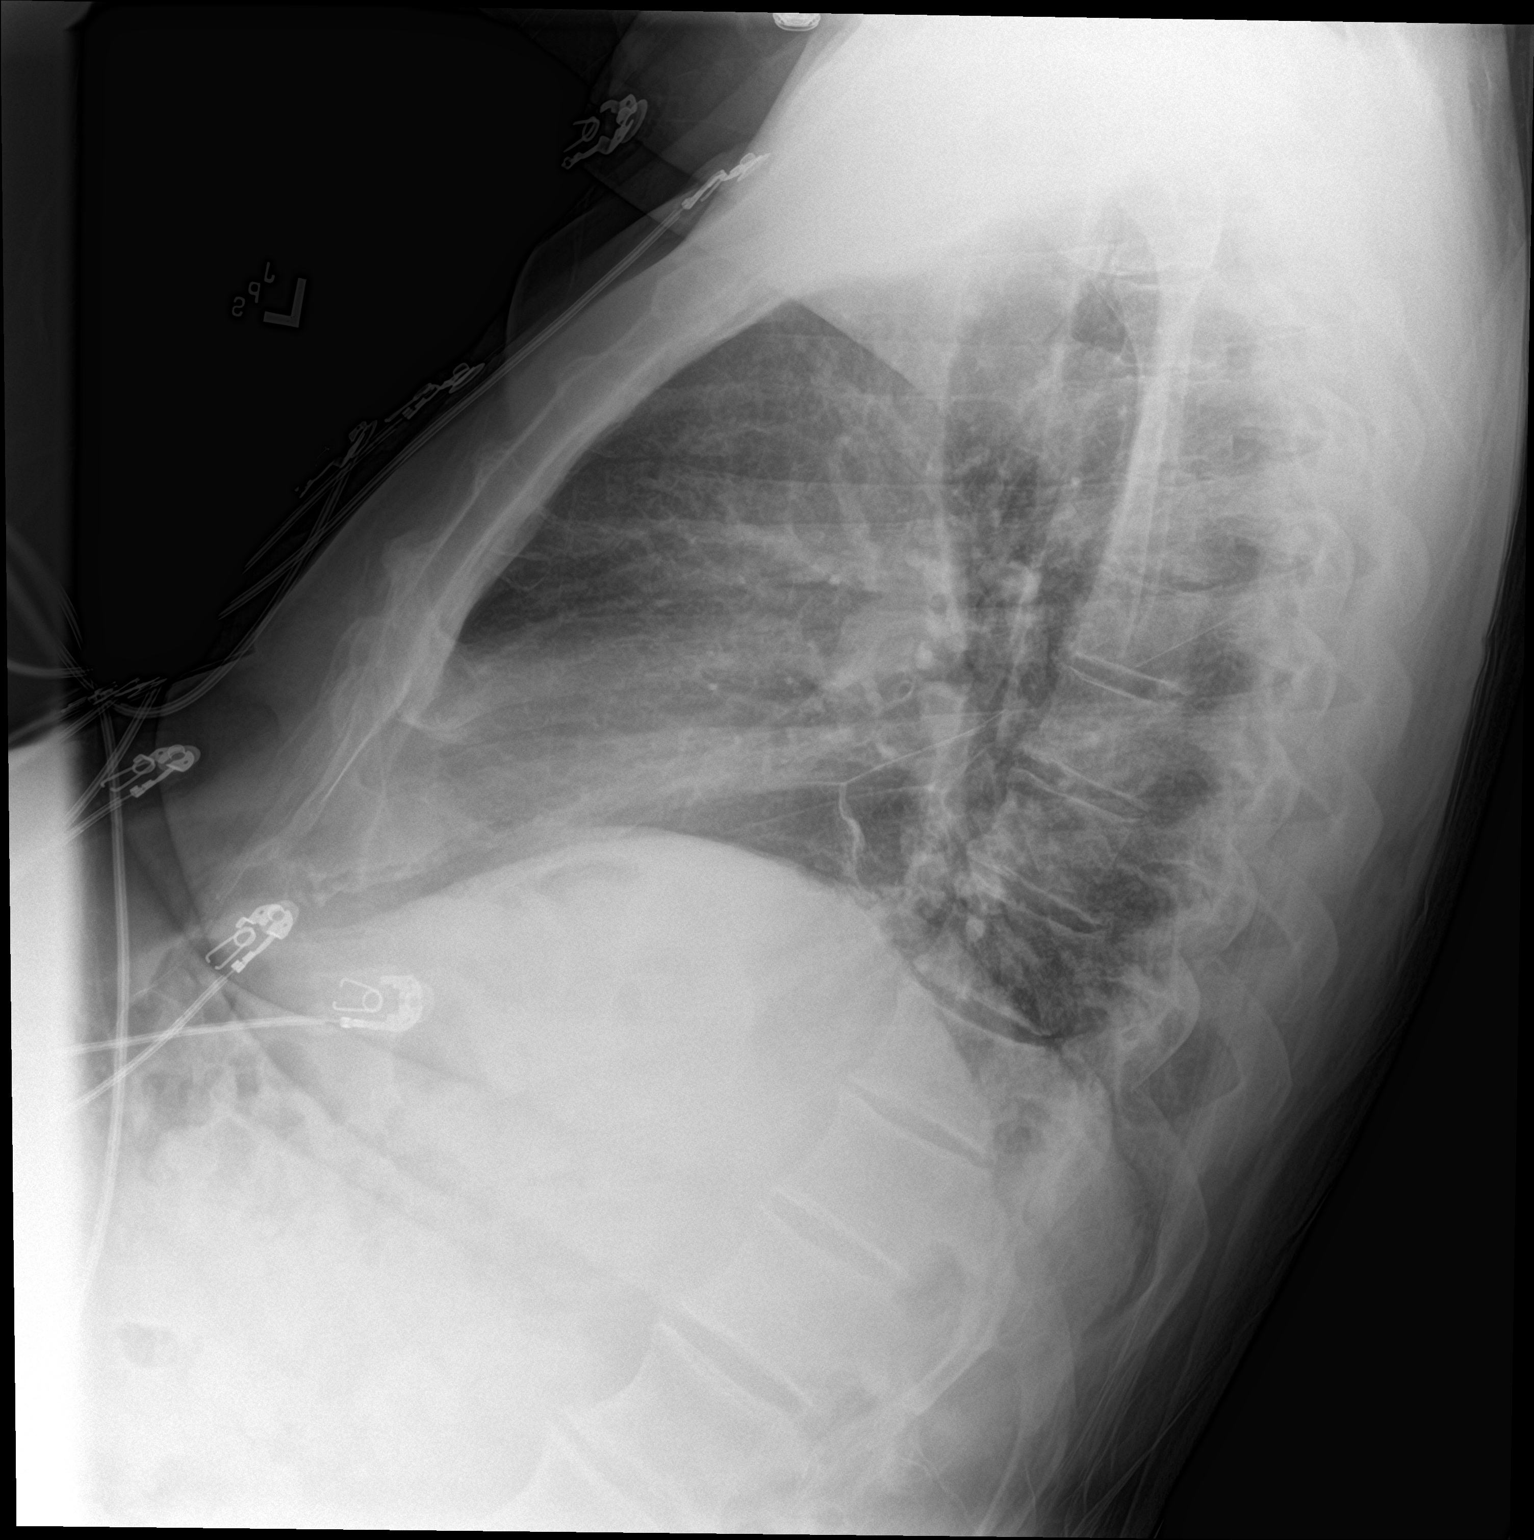

[chest ap]
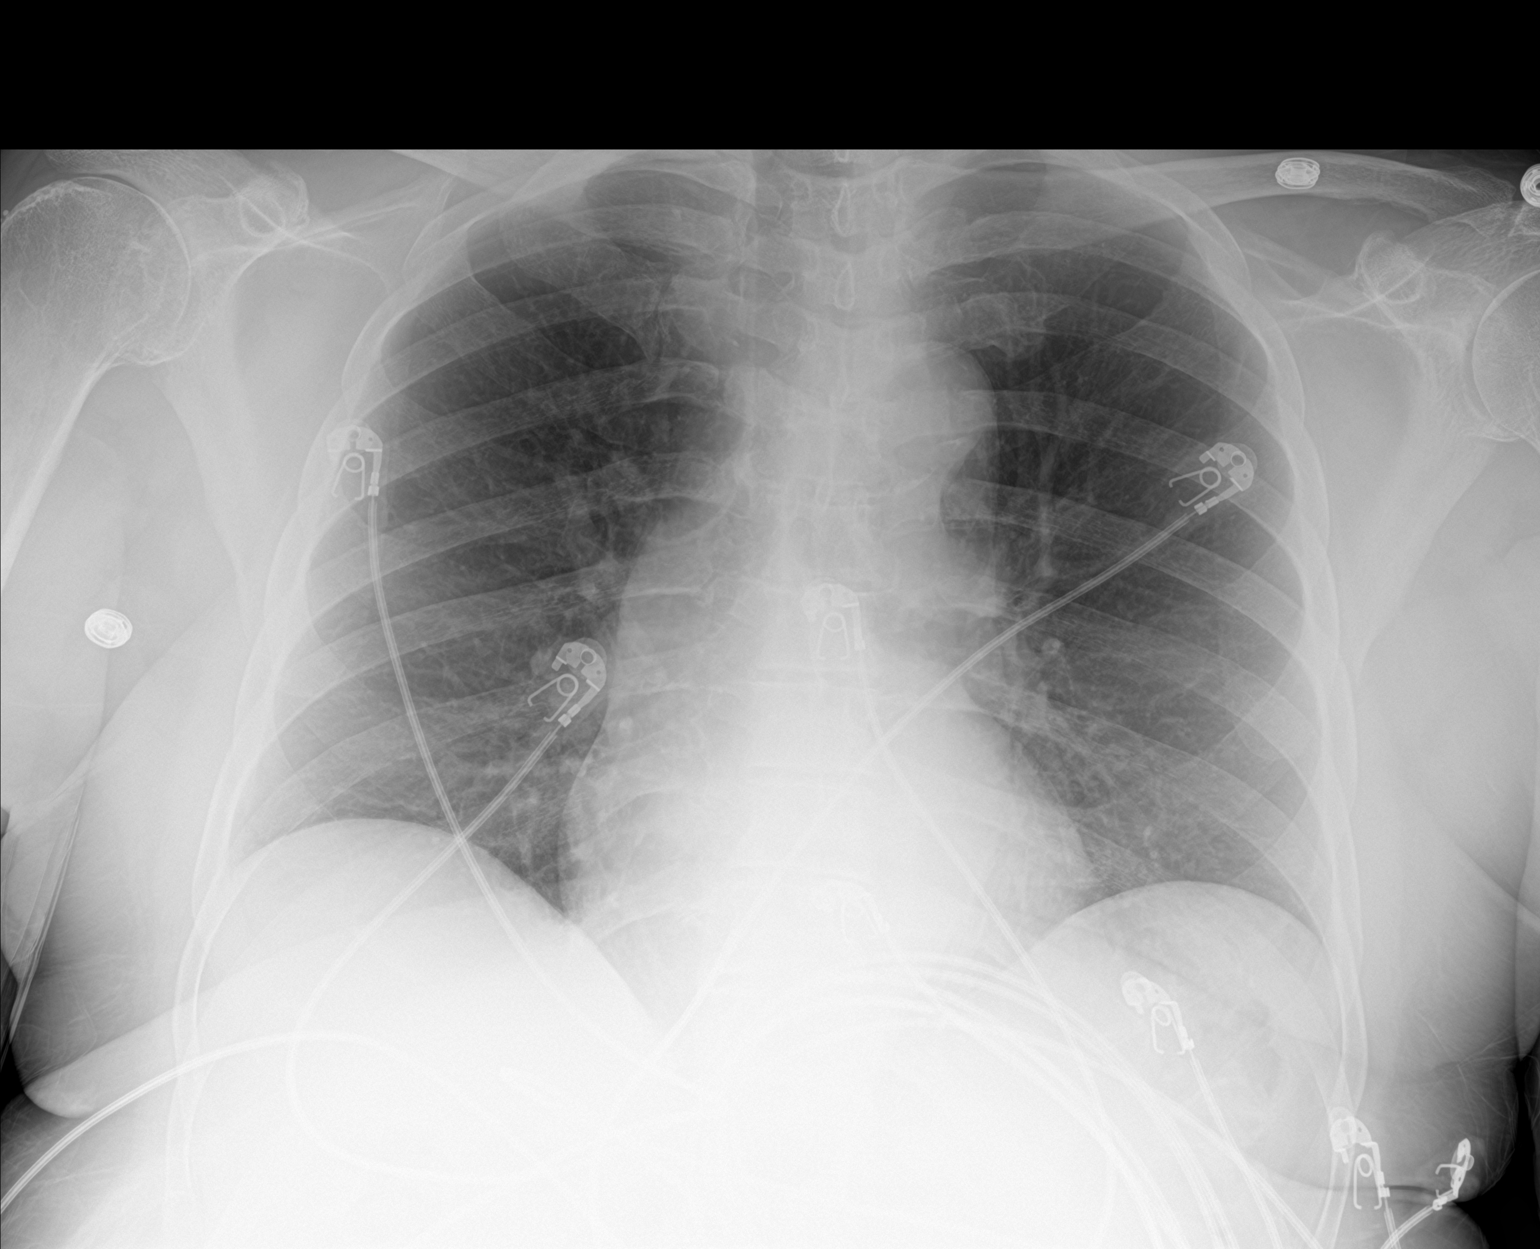

[2 of 2 positions shown; findings below may reference images not displayed]

FINDINGS: Cardiac silhouette is normal in size. No mediastinal or hilar
masses. No evidence of adenopathy.

Clear lungs.  No pleural effusion or pneumothorax.

Skeletal structures are intact.
IMPRESSION: No active cardiopulmonary disease.

## 2021-03-25 MED ORDER — ZOLPIDEM TARTRATE 5 MG PO TABS
5.0000 mg | ORAL_TABLET | Freq: Every evening | ORAL | Status: DC | PRN
  Administered 2021-03-25 – 2021-03-26 (×2): 5 mg via ORAL
  Filled 2021-03-25 (×2): qty 1

## 2021-03-25 MED ORDER — RISPERIDONE 1 MG PO TABS
1.0000 mg | ORAL_TABLET | Freq: Every day | ORAL | Status: DC
  Administered 2021-03-25 – 2021-03-26 (×2): 1 mg via ORAL
  Filled 2021-03-25 (×3): qty 1

## 2021-03-25 MED ORDER — POLYVINYL ALCOHOL 1.4 % OP SOLN
1.0000 [drp] | OPHTHALMIC | Status: DC | PRN
  Filled 2021-03-25: qty 15

## 2021-03-25 MED ORDER — ASPIRIN 81 MG PO CHEW
81.0000 mg | CHEWABLE_TABLET | Freq: Every day | ORAL | Status: DC
  Administered 2021-03-26 – 2021-03-27 (×2): 81 mg via ORAL
  Filled 2021-03-25 (×2): qty 1

## 2021-03-25 MED ORDER — TAMSULOSIN HCL 0.4 MG PO CAPS
0.4000 mg | ORAL_CAPSULE | Freq: Every day | ORAL | Status: DC
  Administered 2021-03-25 – 2021-03-26 (×2): 0.4 mg via ORAL
  Filled 2021-03-25 (×2): qty 1

## 2021-03-25 MED ORDER — ENOXAPARIN SODIUM 40 MG/0.4ML IJ SOSY
40.0000 mg | PREFILLED_SYRINGE | INTRAMUSCULAR | Status: DC
  Administered 2021-03-25 – 2021-03-26 (×2): 40 mg via SUBCUTANEOUS
  Filled 2021-03-25 (×2): qty 0.4

## 2021-03-25 MED ORDER — ALBUTEROL SULFATE (2.5 MG/3ML) 0.083% IN NEBU: 2.5000 mg | INHALATION_SOLUTION | Freq: Four times a day (QID) | RESPIRATORY_TRACT | Status: DC | PRN

## 2021-03-25 MED ORDER — LOSARTAN POTASSIUM 50 MG PO TABS
100.0000 mg | ORAL_TABLET | Freq: Every day | ORAL | Status: DC
  Administered 2021-03-25 – 2021-03-27 (×3): 100 mg via ORAL
  Filled 2021-03-25 (×3): qty 2

## 2021-03-25 MED ORDER — HYDROCHLOROTHIAZIDE 25 MG PO TABS
25.0000 mg | ORAL_TABLET | Freq: Every day | ORAL | Status: DC
  Administered 2021-03-26 – 2021-03-27 (×2): 25 mg via ORAL
  Filled 2021-03-25 (×2): qty 1

## 2021-03-25 MED ORDER — ROSUVASTATIN CALCIUM 20 MG PO TABS
40.0000 mg | ORAL_TABLET | Freq: Every day | ORAL | Status: DC
  Administered 2021-03-25 – 2021-03-26 (×2): 40 mg via ORAL
  Filled 2021-03-25 (×2): qty 2

## 2021-03-25 MED ORDER — ONDANSETRON HCL 4 MG PO TABS: 4.0000 mg | ORAL_TABLET | Freq: Four times a day (QID) | ORAL | Status: DC | PRN

## 2021-03-25 MED ORDER — ALLOPURINOL 300 MG PO TABS
300.0000 mg | ORAL_TABLET | Freq: Every day | ORAL | Status: DC
  Administered 2021-03-26 – 2021-03-27 (×2): 300 mg via ORAL
  Filled 2021-03-25 (×2): qty 1

## 2021-03-25 MED ORDER — ACETAMINOPHEN 650 MG RE SUPP: 650.0000 mg | Freq: Four times a day (QID) | RECTAL | Status: DC | PRN

## 2021-03-25 MED ORDER — ONDANSETRON HCL 4 MG/2ML IJ SOLN: 4.0000 mg | Freq: Four times a day (QID) | INTRAMUSCULAR | Status: DC | PRN

## 2021-03-25 MED ORDER — ESCITALOPRAM OXALATE 10 MG PO TABS
10.0000 mg | ORAL_TABLET | Freq: Every evening | ORAL | Status: DC
  Administered 2021-03-25 – 2021-03-26 (×2): 10 mg via ORAL
  Filled 2021-03-25 (×2): qty 1

## 2021-03-25 MED ORDER — SODIUM CHLORIDE 0.9% FLUSH
3.0000 mL | Freq: Two times a day (BID) | INTRAVENOUS | Status: DC
  Administered 2021-03-25 – 2021-03-27 (×4): 3 mL via INTRAVENOUS

## 2021-03-25 MED ORDER — AMLODIPINE BESYLATE 5 MG PO TABS
5.0000 mg | ORAL_TABLET | Freq: Every day | ORAL | Status: DC
  Administered 2021-03-26 – 2021-03-27 (×2): 5 mg via ORAL
  Filled 2021-03-25 (×2): qty 1

## 2021-03-25 MED ORDER — ACETAMINOPHEN 325 MG PO TABS: 650.0000 mg | ORAL_TABLET | Freq: Four times a day (QID) | ORAL | Status: DC | PRN

## 2021-03-25 MED ORDER — CARBOXYMETHYLCELLULOSE SOD PF 0.5 % OP SOLN: 1.0000 [drp] | Freq: Two times a day (BID) | OPHTHALMIC | Status: DC

## 2021-03-25 NOTE — H&P (Addendum)
History and Physical    [name] Colon WLN:[id number] DOB: [birth year] DOA: 03/25/2021  Referring MD/NP/PA: Sherwood Gambler, MD PCP: Unk Pinto, MD   Patient coming from: home via EMS  Chief Complaint: Found down  I have personally briefly reviewed patient's old medical records in [place]   HPI: [name] Colon is a 80 y.o. 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  History is obtained from the patient with assistance of his wife.  Normally patient has a morning routine which includes showering, shaving, and going to [employer] for a biscuit.  He recalls that he had taken all of his morning medications.  To his recollection that morning he had put close into the wash, shaved, showered, and had put on his boxers along with his pants.  He does not recall exactly what happened next, but think he may have passed out.  His wife just so happened to come see if he had left from [employer] already and found him on the floor leaning against a stool in the bathroom.  He was not able to stand up and his wife was unable to help him up.  Patient admits that yesterday evening he had taken his propranolol 10 mg as needed due to reports of the thunderstorm.  Patient has had issues with thunderstorms since the war.  His medication list appeared to show that he was also supposed to be on atenolol 100 mg daily, but patient reports that he does cannot think that he still on this medicine.  He also reports that he has been more short of breath even walking to the mailbox.   ED Course: Upon admission into the emergency department patient was seen to be afebrile, pulse 46-52, respirations 16-22, blood pressures 153/74-180/83, and all other vital signs maintained.  Labs appear to be near patient's baseline.  There was some possible concern for  stroke which CT scan of the brain was performed which showed no acute abnormalities.  Chest x-ray showed no acute abnormalities.  TRH called to admit for syncope.  Review of Systems  Constitutional: Positive for malaise/fatigue. Negative for fever.  HENT: Negative for congestion and hearing loss.   Eyes: Negative for photophobia and pain.  Respiratory: Positive for shortness of breath.   Cardiovascular: Negative for chest pain and leg swelling.  Gastrointestinal: Negative for abdominal pain, nausea and vomiting.  Genitourinary: Negative for dysuria and hematuria.  Musculoskeletal: Positive for falls and joint pain.  Neurological: Positive for loss of consciousness and weakness.  All other systems reviewed and are negative.   Past Medical History:  Diagnosis Date  . 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 Arthritis   . Axillary abscess    left  . CAD (coronary artery disease)   . Diverticulitis, colon   . History of colonic polyps   . HTN (hypertension)   . Hyperglycemia    diet controlled  . Hyperlipidemia   . Radiation proctitis    with chronic bleeding    Past Surgical History:  Procedure Laterality Date  . COLONOSCOPY    . COLONOSCOPY W/ POLYPECTOMY    . CORONARY ANGIOPLASTY WITH STENT PLACEMENT  04/2008  . MASS EXCISION Left 10/06/2017   Procedure: EXCISION LEFT AXILLA CYST;  Surgeon: Erroll Luna, MD;  Location: [facility];  Service: General;  Laterality: Left;     reports that he quit smoking about 28 years ago. He has never used smokeless tobacco. He reports that he does not drink alcohol and does not use drugs.  No Known Allergies  Family History  Problem Relation Age of Onset  . Heart disease Father   . Hypertension Father   . Hypotension Mother   . Kidney disease Brother   . Colon cancer Neg Hx     Prior to Admission medications   Medication Sig Start Date End Date Taking? Authorizing Provider  allopurinol  (ZYLOPRIM) 300 MG tablet Take 300 mg by mouth daily.    [provider]  amLODipine (NORVASC) 5 MG tablet Take 1 tablet (5 mg total) by mouth daily. 05/03/19   Liane Comber, NP  aspirin 81 MG tablet Take 81 mg by mouth daily.    [provider]  atenolol (TENORMIN) 100 MG tablet Take 1 tablet Daily for BP 09/04/19   Unk Pinto, MD  Cholecalciferol (VITAMIN D) 125 MCG (5000 UT) CAPS Take 5,000 Units by mouth daily. 07/10/20   Liane Comber, NP  escitalopram (LEXAPRO) 20 MG tablet Take 20 mg by mouth daily.    [provider]  losartan-hydrochlorothiazide Konrad Penta) 100-25 MG tablet Take      1 tablet      Daily      for BP 11/27/20   Unk Pinto, MD  nitroGLYCERIN (NITROSTAT) 0.4 MG SL tablet Place 0.4 mg under the tongue every 5 (five) minutes as needed.      [provider]  potassium chloride SA (KLOR-CON) 20 MEQ tablet Take    1 tablet      2 x /day      for Potassium 08/29/20   Unk Pinto, MD  propranolol (INDERAL) 10 MG tablet Take 10 mg by mouth as needed.    [provider]  pyridOXINE (VITAMIN B-6) 50 MG tablet Take 50 mg by mouth daily.      [provider]  risperiDONE (RISPERDAL) 2 MG tablet Take 1 mg by mouth at bedtime.    [provider]  rosuvastatin (CRESTOR) 40 MG tablet Take 1 tablet (40 mg total) by mouth daily. 06/13/19 06/03/20  Minus Breeding, MD  tamsulosin (FLOMAX) 0.4 MG CAPS capsule Take 0.4 mg by mouth daily.      [provider]  traZODone (DESYREL) 150 MG tablet Take  1/2 to 1 tablet  1 hour before  Bedtime as needed for Sleep 02/18/21   Unk Pinto, MD  zolpidem (AMBIEN) 10 MG tablet Take 1 tablet (10 mg total) by mouth at bedtime. 12/25/20   Garnet Sierras, NP    Physical Exam:  Constitutional: NAD, calm, comfortable Vitals:   03/25/21 1230 03/25/21 1321 03/25/21 1430 03/25/21 1535  BP: (!) 160/69 (!) 163/75 (!) 180/83   Pulse: (!) 47 (!) 52 (!) 46   Resp: 16 19 19     Temp:    98.1 F (36.7 C)  TempSrc:    Oral  SpO2: 96% 96% 97%   Weight:      Height:       Eyes: PERRL, lids and conjunctivae normal ENMT: Mucous membranes are moist. Posterior pharynx clear of any exudate or lesions.Normal dentition.  Neck: normal, supple, no masses, no thyromegaly Respiratory: clear to auscultation bilaterally, no wheezing, no crackles. Normal respiratory effort. No accessory muscle use.  Cardiovascular: Regular rate and rhythm, no murmurs / rubs / gallops. No extremity edema. 2+ pedal pulses. No carotid bruits.  Abdomen: no tenderness, no masses palpated. No hepatosplenomegaly. Bowel sounds positive.  Musculoskeletal: no clubbing / cyanosis. No joint deformity upper and lower extremities. Good ROM, no contractures. Normal muscle tone.  Skin: no rashes, lesions, ulcers. No induration Neurologic: CN 2-12 grossly intact. Sensation intact, DTR normal. Strength 5/5 in all 4.  Psychiatric: Normal judgment and insight. Alert and oriented x 3. Normal mood.     Labs on Admission: I have personally reviewed following labs and imaging studies  CBC: Recent Labs  Lab 03/25/21 1228  WBC 5.1  NEUTROABS 2.5  HGB 14.4  HCT 47.0  MCV 81.9  PLT 542   Basic Metabolic Panel: Recent Labs  Lab 03/25/21 1228  NA 138  K 5.0  CL 106  CO2 27  GLUCOSE 121*  BUN 20  CREATININE 1.45*  CALCIUM 9.8   GFR: Estimated Creatinine Clearance: 45 mL/min (A) (by C-G formula based on SCr of 1.45 mg/dL (H)). Liver Function Tests: No results for input(s): AST, ALT, ALKPHOS, BILITOT, PROT, ALBUMIN in the last 168 hours. No results for input(s): LIPASE, AMYLASE in the last 168 hours. No results for input(s): AMMONIA in the last 168 hours. Coagulation Profile: No results for input(s): INR, PROTIME in the last 168 hours. Cardiac Enzymes: No results for input(s): CKTOTAL, CKMB, CKMBINDEX, TROPONINI in the last 168 hours. BNP (last 3 results) No results for input(s): PROBNP in the last  8760 hours. HbA1C: No results for input(s): HGBA1C in the last 72 hours. CBG: No results for input(s): GLUCAP in the last 168 hours. Lipid Profile: No results for input(s): CHOL, HDL, LDLCALC, TRIG, CHOLHDL, LDLDIRECT in the last 72 hours. Thyroid Function Tests: No results for input(s): TSH, T4TOTAL, FREET4, T3FREE, THYROIDAB in the last 72 hours. Anemia Panel: No results for input(s): VITAMINB12, FOLATE, FERRITIN, TIBC, IRON, RETICCTPCT in the last 72 hours. Urine analysis:    Component Value Date/Time   COLORURINE DARK YELLOW 06/03/2020 1000   APPEARANCEUR CLEAR 06/03/2020 1000   LABSPEC 1.023 06/03/2020 1000   PHURINE 6.0 06/03/2020 1000   GLUCOSEU NEGATIVE 06/03/2020 1000   HGBUR NEGATIVE 06/03/2020 1000   BILIRUBINUR NEGATIVE 10/25/2016 1003   KETONESUR NEGATIVE 06/03/2020 1000   PROTEINUR 2+ (A) 06/03/2020 1000   NITRITE NEGATIVE 06/03/2020 1000   LEUKOCYTESUR NEGATIVE 06/03/2020 1000   Sepsis Labs: No results found for this or any previous visit (from the past 240 hour(s)).   Radiological Exams on Admission: DG Chest 2 View  Result Date: 03/25/2021 CLINICAL DATA:  Syncopal episode today. History of hypertension and prostate carcinoma. EXAM: CHEST - 2 VIEW COMPARISON:  06/14/2017. FINDINGS: Cardiac silhouette is normal in size. No mediastinal or hilar masses. No evidence of adenopathy. Clear lungs.  No pleural effusion or pneumothorax. Skeletal structures are intact. IMPRESSION: No active cardiopulmonary disease. Electronically Signed   By: Lajean Manes M.D.   On: 03/25/2021 13:27   CT Head Wo Contrast  Result Date: 03/25/2021 CLINICAL DATA:  Dizziness, nonspecific.  Found down. EXAM: CT HEAD WITHOUT CONTRAST TECHNIQUE: Contiguous axial images were obtained from the base of the skull through the vertex without intravenous contrast. COMPARISON:  CT head 08/17/2012. FINDINGS: Brain: No evidence of acute large vascular territory infarction, hemorrhage, hydrocephalus,  extra-axial collection or mass lesion/mass effect. Generalized cerebral atrophy with ex vacuo ventricular dilation. Mild patchy hypodensities in the white matter, likely related to chronic microvascular ischemic disease. Vascular: Calcific atherosclerosis. No hyperdense vessel identified. Skull: No acute fracture. Sinuses/Orbits: Visualized sinuses are clear. Other: No mastoid effusions. IMPRESSION:  1. No evidence of acute intracranial abnormality. 2. Atrophy and chronic microvascular ischemic disease. Electronically Signed   By: Margaretha Sheffield MD   On: 03/25/2021 14:40    EKG: Independently reviewed. Sinus bradycardia 48 bpm  Assessment/Plan Suspected syncope: Acute.  Patient presents after his wife found to have on the floor in the bathroom.  He does not recall exactly what happened, but he was unable to get up there after.  Reports also having issues with shortness of breath with exertion.  Chest x-ray showed no acute abnormalities.  Suspect symptoms likely related with him taking propranolol in addition to his regular home meds. He follows with Dr. Percival Spanish of cardiology in the outpatient setting. -Admit to a telemetry bed -Check orthostatic vital signs -Check echocardiogram -Consider need to discuss with Dr. Percival Spanish of cardiology in a.m.  Sinus bradycardia: Acute.  Patient noted to have heart rates in the 40-50s on admission.  He admits to taking propranolol 10 mg yesterday evening and due to reports of the thunderstorm and had taken his atenolol 50 mg this morning.   -Follow-up telemetry overnight  -Held beta-blockers -Add on TSH  Essential hypertension: Home blood pressure medication regimen includes atenolol 50 mg twice daily, hydrochlorothiazide 25 mg daily, and losartan 100 mg daily. -Continue hydrochlorothiazide and losartan -Held beta-blocker  Chronic kidney disease stage IIIa: Creatinine 1.45 which appears to be near patient's baseline. -Continue to monitor  Diet-controlled  diabetes mellitus type 2: Last hemoglobin A1c was 6.4 on 09/09/2020. -Heart healthy diet  PTSD -Continue Lexapro and risperidone  Hyperlipidemia -Continue atorvastatin  BPH -Continue to flomax  DVT prophylaxis: Lovenox Code Status: Full Family Communication: Wife updated at bedside Disposition Plan: Possible discharge home tomorrow morning Consults called: None Admission status: Observation  Norval Morton MD [facility]   If 7PM-7AM, please contact night-coverage   03/25/2021, 3:44 PM

## 2021-03-25 NOTE — ED Notes (Signed)
While taking standing orthostatic vital signs, pt began urinating while standing up. Pt did not alert to this and did not seem aware until his wife pointed it out. Pt stated during orthostatics that he didn't feel dizzy or SOB, but felt weak. Pt stood with 2x assist. Pt wife stated that pt has been incontinent on and off lately. Pt cleaned up and changed into new gown, placed back on monitor.

## 2021-03-25 NOTE — ED Notes (Signed)
Wife found pt on the floor beside the chair looking as if pt slid down from chair to the floor. EMS reports pt was almost lethargic upon arrival to house but able to answer questions. GCS 15. 500cc bolus NS given by EMS. Pt is now awake and able to answer questions. NIHSS is 0. Denies any pain or discomfort. Pt reports he passed out and that is all he could remember at home. He said he took a shower and possibly ate breakfast prior to sitting on the chair. Continuous cardiac monitoring initiated. HR noted to be 45-49 on the monitor. EKG done and shown to MD.

## 2021-03-25 NOTE — ED Triage Notes (Signed)
Found on the floor by wife beside chair. NIHSS is 0 per EMS. Reports pt has been lethargic since EMS arrived in pt house. 500cc bolus NS given. Alert and oriented x 4.

## 2021-03-25 NOTE — ED Provider Notes (Signed)
[facility] EMERGENCY DEPARTMENT Provider Note   CSN: [id number] Arrival date & time: 03/25/21  1151     History Chief Complaint  Patient presents with  . Weakness    [name] Colon is a 80 y.o. male.  HPI 80 year old male presents with syncope.  History is from the nurses spoke to EMS as well as the patient.  He was getting out of the shower and then ended up on the ground per the patient.  EMS noted look like he had slid out of a chair onto the floor.  Patient thinks he passed out.  Patient seemed lethargic when EMS first arrived.  Right now the patient has no complaints besides not quite feeling back to his normal self.  However he cannot really describe what he is feeling that is abnormal.  No headaches or chest pain.  Felt like he had a little bit of trouble with his balance yesterday but no focal weakness.  Past Medical History:  Diagnosis Date  . 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 Arthritis   . Axillary abscess    left  . CAD (coronary artery disease)   . Diverticulitis, colon   . History of colonic polyps   . HTN (hypertension)   . Hyperglycemia    diet controlled  . Hyperlipidemia   . Radiation proctitis    with chronic bleeding    Patient Active Problem List   Diagnosis Date Noted  . Syncope 03/25/2021  . Hypercalcemia 07/04/2020  . 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 Murmur 11/07/2019  . Insomnia 10/09/2018  . CKD stage 3 due to type 2 diabetes mellitus ([place]) 03/29/2018  . Gout 04/12/2016  . PTSD (post-traumatic stress disorder) 09/26/2015  . Obesity (BMI 30.0-34.9) 09/26/2015  . Vitamin D deficiency 03/18/2014  . Diet-controlled diabetes mellitus ([place]) 03/17/2014  . Medication management 03/17/2014  . 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 ADENOCARCINOMA, PROSTATE 08/29/2008  . COLONIC POLYPS 08/29/2008  . Hyperlipidemia associated with type 2 diabetes mellitus ([place])  08/29/2008  . Essential hypertension 08/29/2008  . Coronary atherosclerosis 08/29/2008  . Diverticulosis of colon 08/29/2008    Past Surgical History:  Procedure Laterality Date  . COLONOSCOPY    . COLONOSCOPY W/ POLYPECTOMY    . CORONARY ANGIOPLASTY WITH STENT PLACEMENT  04/2008  . MASS EXCISION Left 10/06/2017   Procedure: EXCISION LEFT AXILLA CYST;  Surgeon: Erroll Luna, MD;  Location: [facility];  Service: General;  Laterality: Left;       Family History  Problem Relation Age of Onset  . Heart disease Father   . Hypertension Father   . Hypotension Mother   . Kidney disease Brother   . Colon cancer Neg Hx     Social History   Tobacco Use  . Smoking status: Former Smoker    Quit date: 11/22/1992    Years since quitting: 28.3  . Smokeless tobacco: Never Used  Substance Use Topics  . Alcohol use: No  . Drug use: No    Home Medications Prior to Admission medications   Medication Sig Start Date End Date Taking? Authorizing Provider  allopurinol (ZYLOPRIM) 300 MG tablet Take 300 mg by mouth daily.    [provider]  amLODipine (NORVASC) 5 MG tablet Take 1 tablet (5 mg total) by mouth daily. 05/03/19   Liane Comber, NP  aspirin 81 MG tablet Take 81 mg by mouth daily.    [provider]  atenolol (TENORMIN) 100 MG tablet Take 1 tablet Daily for BP 09/04/19   Unk Pinto, MD  Cholecalciferol (VITAMIN D) 125 MCG (5000 UT) CAPS Take 5,000 Units by mouth daily. 07/10/20   Liane Comber, NP  escitalopram (LEXAPRO) 20 MG tablet Take 20 mg by mouth daily.    [provider]  losartan-hydrochlorothiazide Konrad Penta) 100-25 MG tablet Take      1 tablet      Daily      for BP 11/27/20   Unk Pinto, MD  nitroGLYCERIN (NITROSTAT) 0.4 MG SL tablet Place 0.4 mg under the tongue every 5 (five) minutes as needed.      [provider]  potassium chloride SA (KLOR-CON) 20 MEQ tablet Take    1 tablet      2 x /day      for  Potassium 08/29/20   Unk Pinto, MD  propranolol (INDERAL) 10 MG tablet Take 10 mg by mouth as needed.    [provider]  pyridOXINE (VITAMIN B-6) 50 MG tablet Take 50 mg by mouth daily.      [provider]  risperiDONE (RISPERDAL) 2 MG tablet Take 1 mg by mouth at bedtime.    [provider]  rosuvastatin (CRESTOR) 40 MG tablet Take 1 tablet (40 mg total) by mouth daily. 06/13/19 06/03/20  Minus Breeding, MD  tamsulosin (FLOMAX) 0.4 MG CAPS capsule Take 0.4 mg by mouth daily.      [provider]  traZODone (DESYREL) 150 MG tablet Take  1/2 to 1 tablet  1 hour before  Bedtime as needed for Sleep 02/18/21   Unk Pinto, MD  zolpidem (AMBIEN) 10 MG tablet Take 1 tablet (10 mg total) by mouth at bedtime. 12/25/20   Garnet Sierras, NP    Allergies    Patient has no known allergies.  Review of Systems   Review of Systems  Respiratory: Negative for shortness of breath.   Cardiovascular: Negative for chest pain.  Neurological: Positive for syncope. Negative for dizziness, weakness, light-headedness, numbness and headaches.  All other systems reviewed and are negative.   Physical Exam Updated Vital Signs BP (!) 180/83   Pulse (!) 46   Temp 98 F (36.7 C) (Oral)   Resp 19   Ht 5\' 9"  (5.643 m)   Wt 89.8 kg   SpO2 97%   BMI 29.24 kg/m   Physical Exam Vitals and nursing note reviewed.  Constitutional:      General: He is not in acute distress.    Appearance: He is well-developed. He is not ill-appearing or diaphoretic.  HENT:     Head: Normocephalic and atraumatic.     Right Ear: External ear normal.     Left Ear: External ear normal.     Nose: Nose normal.  Eyes:     General:        Right eye: No discharge.        Left eye: No discharge.     Extraocular Movements: Extraocular movements intact.     Pupils: Pupils are equal, round, and reactive to light.  Cardiovascular:     Rate and Rhythm: Regular rhythm. Bradycardia present.      Heart sounds: Normal heart sounds.  Pulmonary:     Effort: Pulmonary effort is normal.     Breath sounds: Normal breath sounds.  Abdominal:     Palpations: Abdomen is soft.     Tenderness: There is no abdominal tenderness.  Musculoskeletal:  Cervical back: Neck supple.  Skin:    General: Skin is warm and dry.  Neurological:     Mental Status: He is alert and oriented to person, place, and time.     Comments: CN 3-12 grossly intact. 5/5 strength in all 4 extremities. Grossly normal sensation. Normal finger to nose.   Psychiatric:        Mood and Affect: Mood is not anxious.     ED Results / Procedures / Treatments   Labs (all labs ordered are listed, but only abnormal results are displayed) Labs Reviewed  BASIC METABOLIC PANEL - Abnormal; Notable for the following components:      Result Value   Glucose, Bld 121 (*)    Creatinine, Ser 1.45 (*)    GFR, Estimated 49 (*)    All other components within normal limits  CBC WITH DIFFERENTIAL/PLATELET - Abnormal; Notable for the following components:   MCH 25.1 (*)    RDW 17.0 (*)    All other components within normal limits  SARS CORONAVIRUS 2 (TAT 6-24 HRS)    EKG EKG Interpretation  Date/Time:  Wednesday Mar 25 2021 12:01:59 EDT Ventricular Rate:  48 PR Interval:  207 QRS Duration: 97 QT Interval:  498 QTC Calculation: 445 R Axis:   -29 Text Interpretation: Sinus bradycardia LVH with secondary repolarization abnormality Confirmed by Sherwood Gambler [phone]) on 03/25/2021 12:02:49 PM   Radiology DG Chest 2 View  Result Date: 03/25/2021 CLINICAL DATA:  Syncopal episode today. History of hypertension and prostate carcinoma. EXAM: CHEST - 2 VIEW COMPARISON:  06/14/2017. FINDINGS: Cardiac silhouette is normal in size. No mediastinal or hilar masses. No evidence of adenopathy. Clear lungs.  No pleural effusion or pneumothorax. Skeletal structures are intact. IMPRESSION: No active cardiopulmonary disease. Electronically Signed    By: Lajean Manes M.D.   On: 03/25/2021 13:27   CT Head Wo Contrast  Result Date: 03/25/2021 CLINICAL DATA:  Dizziness, nonspecific.  Found down. EXAM: CT HEAD WITHOUT CONTRAST TECHNIQUE: Contiguous axial images were obtained from the base of the skull through the vertex without intravenous contrast. COMPARISON:  CT head 08/17/2012. FINDINGS: Brain: No evidence of acute large vascular territory infarction, hemorrhage, hydrocephalus, extra-axial collection or mass lesion/mass effect. Generalized cerebral atrophy with ex vacuo ventricular dilation. Mild patchy hypodensities in the white matter, likely related to chronic microvascular ischemic disease. Vascular: Calcific atherosclerosis. No hyperdense vessel identified. Skull: No acute fracture. Sinuses/Orbits: Visualized sinuses are clear. Other: No mastoid effusions. IMPRESSION: 1. No evidence of acute intracranial abnormality. 2. Atrophy and chronic microvascular ischemic disease. Electronically Signed   By: Margaretha Sheffield MD   On: 03/25/2021 14:40    Procedures Procedures   Medications Ordered in ED Medications - No data to display  ED Course  I have reviewed the triage vital signs and the nursing notes.  Pertinent labs & imaging results that were available during my care of the patient were reviewed by me and considered in my medical decision making (see chart for details).    MDM Rules/Calculators/A&P                          Patient presents with syncope.  Unclear exact cause.  He has been bradycardic but only mild or more than typical for him.  No obvious AV block.  Questionable whether he had a seizure after talking to wife where he seemed a little confused.  Thus at this point will admit for observation and further  syncope work-up.  Discussed with Dr. Tamala Julian. Final Clinical Impression(s) / ED Diagnoses Final diagnoses:  Syncope and collapse    Rx / DC Orders ED Discharge Orders    None       Sherwood Gambler, MD 03/25/21  1530

## 2021-03-25 NOTE — Plan of Care (Signed)
  Problem: Elimination: Goal: Will not experience complications related to bowel motility Outcome: Progressing Goal: Will not experience complications related to urinary retention Outcome: Progressing   Problem: Pain Managment: Goal: General experience of comfort will improve Outcome: Progressing   Problem: Safety: Goal: Ability to remain free from injury will improve Outcome: Progressing   

## 2021-03-26 ENCOUNTER — Observation Stay (HOSPITAL_COMMUNITY): Payer: Medicare Other

## 2021-03-26 ENCOUNTER — Encounter: Payer: Self-pay | Admitting: Internal Medicine

## 2021-03-26 DIAGNOSIS — R55 Syncope and collapse: Secondary | ICD-10-CM

## 2021-03-26 LAB — CBC
HCT: 45 % (ref 39.0–52.0)
Hemoglobin: 14.3 g/dL (ref 13.0–17.0)
MCH: 25.6 pg — ABNORMAL LOW (ref 26.0–34.0)
MCHC: 31.8 g/dL (ref 30.0–36.0)
MCV: 80.5 fL (ref 80.0–100.0)
Platelets: 154 10*3/uL (ref 150–400)
RBC: 5.59 MIL/uL (ref 4.22–5.81)
RDW: 16.9 % — ABNORMAL HIGH (ref 11.5–15.5)
WBC: 5.2 10*3/uL (ref 4.0–10.5)
nRBC: 0 % (ref 0.0–0.2)

## 2021-03-26 LAB — ECHOCARDIOGRAM LIMITED
AR max vel: 2.87 cm2
AV Area VTI: 2.38 cm2
AV Area mean vel: 2.63 cm2
AV Mean grad: 3 mmHg
AV Peak grad: 6.7 mmHg
Ao pk vel: 1.29 m/s
Area-P 1/2: 2.24 cm2
Height: 69 in
S' Lateral: 2.8 cm
Weight: 3156.99 oz

## 2021-03-26 LAB — BASIC METABOLIC PANEL
Anion gap: 6 (ref 5–15)
BUN: 22 mg/dL (ref 8–23)
CO2: 27 mmol/L (ref 22–32)
Calcium: 10.1 mg/dL (ref 8.9–10.3)
Chloride: 106 mmol/L (ref 98–111)
Creatinine, Ser: 1.56 mg/dL — ABNORMAL HIGH (ref 0.61–1.24)
GFR, Estimated: 45 mL/min — ABNORMAL LOW (ref >60)
Glucose, Bld: 110 mg/dL — ABNORMAL HIGH (ref 70–99)
Potassium: 3.3 mmol/L — ABNORMAL LOW (ref 3.5–5.1)
Sodium: 139 mmol/L (ref 135–145)

## 2021-03-26 MED ORDER — POTASSIUM CHLORIDE CRYS ER 20 MEQ PO TBCR
40.0000 meq | EXTENDED_RELEASE_TABLET | Freq: Once | ORAL | Status: AC
  Administered 2021-03-26: 40 meq via ORAL
  Filled 2021-03-26: qty 2

## 2021-03-26 NOTE — Progress Notes (Incomplete)
  Echocardiogram 2D Echocardiogram has been performed.  Cammy Brochure 03/26/2021, 2:44 PM
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
## 2021-03-27 DIAGNOSIS — I129 Hypertensive chronic kidney disease with stage 1 through stage 4 chronic kidney disease, or unspecified chronic kidney disease: Secondary | ICD-10-CM | POA: Diagnosis present

## 2021-03-27 DIAGNOSIS — Z841 Family history of disorders of kidney and ureter: Secondary | ICD-10-CM | POA: Diagnosis not present

## 2021-03-27 DIAGNOSIS — Z8546 Personal history of malignant neoplasm of prostate: Secondary | ICD-10-CM | POA: Diagnosis not present

## 2021-03-27 DIAGNOSIS — E785 Hyperlipidemia, unspecified: Secondary | ICD-10-CM | POA: Diagnosis present

## 2021-03-27 DIAGNOSIS — E1122 Type 2 diabetes mellitus with diabetic chronic kidney disease: Secondary | ICD-10-CM | POA: Diagnosis present

## 2021-03-27 DIAGNOSIS — E119 Type 2 diabetes mellitus without complications: Secondary | ICD-10-CM | POA: Diagnosis not present

## 2021-03-27 DIAGNOSIS — Z79899 Other long term (current) drug therapy: Secondary | ICD-10-CM | POA: Diagnosis not present

## 2021-03-27 DIAGNOSIS — Z79891 Long term (current) use of opiate analgesic: Secondary | ICD-10-CM | POA: Diagnosis not present

## 2021-03-27 DIAGNOSIS — I10 Essential (primary) hypertension: Secondary | ICD-10-CM | POA: Diagnosis not present

## 2021-03-27 DIAGNOSIS — Z87891 Personal history of nicotine dependence: Secondary | ICD-10-CM | POA: Diagnosis not present

## 2021-03-27 DIAGNOSIS — E559 Vitamin D deficiency, unspecified: Secondary | ICD-10-CM | POA: Diagnosis present

## 2021-03-27 DIAGNOSIS — Z20822 Contact with and (suspected) exposure to covid-19: Secondary | ICD-10-CM | POA: Diagnosis present

## 2021-03-27 DIAGNOSIS — R001 Bradycardia, unspecified: Secondary | ICD-10-CM | POA: Diagnosis present

## 2021-03-27 DIAGNOSIS — R55 Syncope and collapse: Secondary | ICD-10-CM | POA: Diagnosis not present

## 2021-03-27 DIAGNOSIS — Z8249 Family history of ischemic heart disease and other diseases of the circulatory system: Secondary | ICD-10-CM | POA: Diagnosis not present

## 2021-03-27 DIAGNOSIS — I2510 Atherosclerotic heart disease of native coronary artery without angina pectoris: Secondary | ICD-10-CM | POA: Diagnosis present

## 2021-03-27 DIAGNOSIS — Z955 Presence of coronary angioplasty implant and graft: Secondary | ICD-10-CM | POA: Diagnosis not present

## 2021-03-27 DIAGNOSIS — E1169 Type 2 diabetes mellitus with other specified complication: Secondary | ICD-10-CM | POA: Diagnosis present

## 2021-03-27 DIAGNOSIS — E876 Hypokalemia: Secondary | ICD-10-CM | POA: Diagnosis present

## 2021-03-27 DIAGNOSIS — M109 Gout, unspecified: Secondary | ICD-10-CM | POA: Diagnosis present

## 2021-03-27 DIAGNOSIS — Z7982 Long term (current) use of aspirin: Secondary | ICD-10-CM | POA: Diagnosis not present

## 2021-03-27 DIAGNOSIS — N1831 Chronic kidney disease, stage 3a: Secondary | ICD-10-CM | POA: Diagnosis not present

## 2021-03-27 DIAGNOSIS — F4310 Post-traumatic stress disorder, unspecified: Secondary | ICD-10-CM | POA: Diagnosis present

## 2021-03-27 DIAGNOSIS — N400 Enlarged prostate without lower urinary tract symptoms: Secondary | ICD-10-CM | POA: Diagnosis present

## 2021-03-27 DIAGNOSIS — M1990 Unspecified osteoarthritis, unspecified site: Secondary | ICD-10-CM | POA: Diagnosis present

## 2021-03-27 LAB — BASIC METABOLIC PANEL
Anion gap: 8 (ref 5–15)
BUN: 19 mg/dL (ref 8–23)
CO2: 29 mmol/L (ref 22–32)
Calcium: 10.3 mg/dL (ref 8.9–10.3)
Chloride: 100 mmol/L (ref 98–111)
Creatinine, Ser: 1.52 mg/dL — ABNORMAL HIGH (ref 0.61–1.24)
GFR, Estimated: 46 mL/min — ABNORMAL LOW (ref >60)
Glucose, Bld: 99 mg/dL (ref 70–99)
Potassium: 3.2 mmol/L — ABNORMAL LOW (ref 3.5–5.1)
Sodium: 137 mmol/L (ref 135–145)

## 2021-03-27 LAB — MAGNESIUM: Magnesium: 2.3 mg/dL (ref 1.7–2.4)

## 2021-03-27 MED ORDER — AMLODIPINE BESYLATE 10 MG PO TABS: 10.0000 mg | ORAL_TABLET | Freq: Every day | ORAL | Status: DC

## 2021-03-27 MED ORDER — POTASSIUM CHLORIDE CRYS ER 20 MEQ PO TBCR
40.0000 meq | EXTENDED_RELEASE_TABLET | Freq: Once | ORAL | Status: AC
  Administered 2021-03-27: 40 meq via ORAL
  Filled 2021-03-27: qty 2

## 2021-03-27 MED ORDER — AMLODIPINE BESYLATE 10 MG PO TABS: 10.0000 mg | ORAL_TABLET | Freq: Every day | ORAL | 1 refills | Status: DC

## 2021-03-27 NOTE — Discharge Summary (Signed)
Physician Discharge Summary  [name] Colon VPX:[id number] DOB: [birth year] DOA: 03/25/2021  PCP: Unk Pinto, MD  Admit date: 03/25/2021 Discharge date: 03/27/2021  Admitted From: Home Disposition: Home  Recommendations for Outpatient Follow-up:  1. Follow up with PCP in 1-2 weeks 2. Please obtain BMP/CBC in one week 3. Please follow up on the following pending results: None  Home Health: No Equipment/Devices: None Discharge Condition: Stable CODE STATUS: Full Diet recommendation: Heart Healthy / Carb Modified   Brief/Interim Summary: [name] Colon a 80 y.o.malewith medical history significant ofHTN, HLD, diet-controlled DMtype 2CKD stageIII, BPH,prostate cancer s/pBrachiotherapy in 2002who presents after being found down by his wife on the morning of presenting to ED. Found to have bradycardia.  Patient was taking atenolol twice daily and propanolol as needed for anxiety, mostly taking before any thunderstorm, stating that he had PTSD.  No chest pain.  Mild exertional dyspnea.  His home dose of atenolol was discontinued and he was advised to take propranolol sporadically only if needed for severe anxiety.  Heart rate improved and symptoms resolved before discharge.  His home dose of amlodipine was increased to 10 mg for better control of hypertension as atenolol was discontinued.  He will continue rest of his home medications and follow-up with his providers.  Discharge Diagnoses:  Principal Problem:   Syncope and collapse Active Problems:   Hyperlipidemia associated with type 2 diabetes mellitus (HCC)   Essential hypertension   Diet-controlled diabetes mellitus (HCC)   PTSD (post-traumatic stress disorder)   CKD (chronic kidney disease), stage III (HCC)   Sinus bradycardia   Discharge Instructions  Discharge Instructions    Diet - low sodium heart healthy   Complete by: As directed    Discharge instructions   Complete by: As directed    It was pleasure  taking care of you. As we discussed please stop taking atenolol, I increase the dose of amlodipine to 10 mg daily instead of 5 mg, you can continue taking your existing medications by taking 2 pills at a time and then start taking 1 pill of 10 mg. Can continue propranolol only as needed. Keep yourself well-hydrated and have a close follow-up with your doctors to monitor your heart rate and blood pressure.   Increase activity slowly   Complete by: As directed      Allergies as of 03/27/2021   No Known Allergies     Medication List    STOP taking these medications   atenolol 50 MG tablet Commonly known as: TENORMIN   traZODone 150 MG tablet Commonly known as: DESYREL     TAKE these medications   allopurinol 300 MG tablet Commonly known as: ZYLOPRIM Take 300 mg by mouth daily.   amLODipine 10 MG tablet Commonly known as: NORVASC Take 1 tablet (10 mg total) by mouth daily. Start taking on: Mar 28, 2021 What changed:   medication strength  how much to take   aspirin 81 MG tablet Take 81 mg by mouth daily.   cetirizine 10 MG tablet Commonly known as: ZYRTEC Take 10 mg by mouth daily in the afternoon.   escitalopram 10 MG tablet Commonly known as: LEXAPRO Take 10 mg by mouth every evening.   hydrochlorothiazide 25 MG tablet Commonly known as: HYDRODIURIL Take 25 mg by mouth daily.   losartan 100 MG tablet Commonly known as: COZAAR Take 100 mg by mouth daily.   Lubricating Plus Eye Drops 0.5 % Soln Generic drug: Carboxymethylcellulose Sod PF Apply 1 drop to eye  2 (two) times daily.   nitroGLYCERIN 0.4 MG SL tablet Commonly known as: NITROSTAT Place 0.4 mg under the tongue every 5 (five) minutes as needed for chest pain.   potassium chloride SA 20 MEQ tablet Commonly known as: KLOR-CON Take    1 tablet      2 x /day      for Potassium What changed:   how much to take  how to take this  when to take this  additional instructions   propranolol 10 MG  tablet Commonly known as: INDERAL Take 10 mg by mouth as needed (anxiety).   pyridOXINE 50 MG tablet Commonly known as: VITAMIN B-6 Take 50 mg by mouth daily.   risperiDONE 2 MG tablet Commonly known as: RISPERDAL Take 1 mg by mouth at bedtime.   rosuvastatin 40 MG tablet Commonly known as: CRESTOR Take 40 mg by mouth at bedtime.   tamsulosin 0.4 MG Caps capsule Commonly known as: FLOMAX Take 0.4 mg by mouth at bedtime.   Vitamin D 125 MCG (5000 UT) Caps Take 5,000 Units by mouth daily.   zolpidem 10 MG tablet Commonly known as: AMBIEN Take 1 tablet (10 mg total) by mouth at bedtime.       Follow-up Information    Unk Pinto, MD. Schedule an appointment as soon as possible for a visit.   Specialty: Internal Medicine Contact information: [address] [place] [address] [phone]        Minus Breeding, MD .   Specialty: Cardiology Contact information: [address] [place] [address] [phone]              No Known Allergies  Consultations:  None  Procedures/Studies: DG Chest 2 View  Result Date: 03/25/2021 CLINICAL DATA:  Syncopal episode today. History of hypertension and prostate carcinoma. EXAM: CHEST - 2 VIEW COMPARISON:  06/14/2017. FINDINGS: Cardiac silhouette is normal in size. No mediastinal or hilar masses. No evidence of adenopathy. Clear lungs.  No pleural effusion or pneumothorax. Skeletal structures are intact. IMPRESSION: No active cardiopulmonary disease. Electronically Signed   By: Lajean Manes M.D.   On: 03/25/2021 13:27   CT Head Wo Contrast  Result Date: 03/25/2021 CLINICAL DATA:  Dizziness, nonspecific.  Found down. EXAM: CT HEAD WITHOUT CONTRAST TECHNIQUE: Contiguous axial images were obtained from the base of the skull through the vertex without intravenous contrast. COMPARISON:  CT head 08/17/2012. FINDINGS: Brain: No evidence of acute large vascular territory infarction, hemorrhage,  hydrocephalus, extra-axial collection or mass lesion/mass effect. Generalized cerebral atrophy with ex vacuo ventricular dilation. Mild patchy hypodensities in the white matter, likely related to chronic microvascular ischemic disease. Vascular: Calcific atherosclerosis. No hyperdense vessel identified. Skull: No acute fracture. Sinuses/Orbits: Visualized sinuses are clear. Other: No mastoid effusions. IMPRESSION: 1. No evidence of acute intracranial abnormality. 2. Atrophy and chronic microvascular ischemic disease. Electronically Signed   By: Margaretha Sheffield MD   On: 03/25/2021 14:40   ECHOCARDIOGRAM LIMITED  Result Date: 03/26/2021    ECHOCARDIOGRAM LIMITED REPORT   Patient Name:   [name] Colon Date of Exam: 03/26/2021 Medical Rec #:  [id number]         Height:       69.0 in Accession #:    [id number]        Weight:       197.3 lb Date of Birth:  1941-07-11         BSA:          2.054 m  Patient Age:    80 years          BP:           142/75 mmHg Patient Gender: M                 HR:           47 bpm. Exam Location:  Inpatient Procedure: 2D Echo, Cardiac Doppler and Color Doppler Indications:    Syncope  History:        Patient has no prior history of Echocardiogram examinations.                 CAD; Risk Factors:Hypertension.  Sonographer:    Cammy Brochure Referring Phys: [id number] RONDELL A SMITH IMPRESSIONS  1. Left ventricular ejection fraction, by estimation, is 55 to 60%. The left ventricle has normal function. The left ventricle has no regional wall motion abnormalities. Left ventricular diastolic parameters are consistent with Grade I diastolic dysfunction (impaired relaxation).  2. Right ventricular systolic function is normal. The right ventricular size is normal.  3. The mitral valve is normal in structure. Trivial mitral valve regurgitation. No evidence of mitral stenosis.  4. The aortic valve is tricuspid. Aortic valve regurgitation is mild and eccentric. No aortic stenosis is present.  5.  The inferior vena cava is normal in size with greater than 50% respiratory variability, suggesting right atrial pressure of 3 mmHg. Comparison(s): No prior Echocardiogram. FINDINGS  Left Ventricle: Left ventricular ejection fraction, by estimation, is 55 to 60%. The left ventricle has normal function. The left ventricle has no regional wall motion abnormalities. The left ventricular internal cavity size was normal in size. There is  no left ventricular hypertrophy. Left ventricular diastolic parameters are consistent with Grade I diastolic dysfunction (impaired relaxation). Right Ventricle: The right ventricular size is normal. No increase in right ventricular wall thickness. Right ventricular systolic function is normal. Left Atrium: Left atrial size was normal in size. Right Atrium: Right atrial size was normal in size. Mitral Valve: The mitral valve is normal in structure. Trivial mitral valve regurgitation. No evidence of mitral valve stenosis. Tricuspid Valve: The tricuspid valve is grossly normal. Tricuspid valve regurgitation is trivial. No evidence of tricuspid stenosis. Aortic Valve: The aortic valve is tricuspid. Aortic valve regurgitation is mild. No aortic stenosis is present. Aortic valve mean gradient measures 3.0 mmHg. Aortic valve peak gradient measures 6.7 mmHg. Aortic valve area, by VTI measures 2.38 cm. Pulmonic Valve: Pulmonic valve regurgitation is not visualized. No evidence of pulmonic stenosis. Aorta: The aortic root is normal in size and structure. Venous: The inferior vena cava is normal in size with greater than 50% respiratory variability, suggesting right atrial pressure of 3 mmHg. LEFT VENTRICLE PLAX 2D LVIDd:         4.00 cm  Diastology LVIDs:         2.80 cm  LV e' medial:    5.55 cm/s LV PW:         0.90 cm  LV E/e' medial:  11.7 LV IVS:        1.00 cm  LV e' lateral:   6.64 cm/s LVOT diam:     2.00 cm  LV E/e' lateral: 9.8 LV SV:         68 LV SV Index:   33 LVOT Area:     3.14 cm   RIGHT VENTRICLE             IVC RV S prime:  10.10 cm/s  IVC diam: 1.20 cm TAPSE (M-mode): 2.8 cm LEFT ATRIUM             Index       RIGHT ATRIUM           Index LA diam:        3.20 cm 1.56 cm/m  RA Area:     15.30 cm LA Vol (A2C):   36.0 ml 17.53 ml/m RA Volume:   39.85 ml  19.40 ml/m LA Vol (A4C):   34.9 ml 16.99 ml/m LA Biplane Vol: 38.9 ml 18.94 ml/m  AORTIC VALVE AV Area (Vmax):    2.87 cm AV Area (Vmean):   2.63 cm AV Area (VTI):     2.38 cm AV Vmax:           129.00 cm/s AV Vmean:          82.300 cm/s AV VTI:            0.287 m AV Peak Grad:      6.7 mmHg AV Mean Grad:      3.0 mmHg LVOT Vmax:         118.00 cm/s LVOT Vmean:        68.800 cm/s LVOT VTI:          0.217 m LVOT/AV VTI ratio: 0.76  AORTA Ao Root diam: 3.20 cm MITRAL VALVE                TRICUSPID VALVE MV Area (PHT): 2.24 cm     TR Peak grad:   22.7 mmHg MV Decel Time: 338 msec     TR Vmax:        238.00 cm/s MV E velocity: 64.90 cm/s MV A velocity: 129.00 cm/s  SHUNTS MV E/A ratio:  0.50         Systemic VTI:  0.22 m                             Systemic Diam: 2.00 cm Rudean Haskell MD Electronically signed by Rudean Haskell MD Signature Date/Time: 03/26/2021/4:45:51 PM    Final      Subjective: Patient was seen and examined today.  No new complaint.  Wants to go home.  We discussed about discontinuing atenolol, increasing the dose of amlodipine.  He uses propranolol only as needed before getting thunderstorms due to his PTSD.  Discharge Exam: Vitals:   03/27/21 0820 03/27/21 1034  BP: (!) 148/72 (!) 148/85  Pulse: (!) 51 (!) 57  Resp: 19   Temp: 98.9 F (37.2 C)   SpO2: 96%    Vitals:   03/26/21 2123 03/27/21 0359 03/27/21 0820 03/27/21 1034  BP: (!) 171/81 (!) 167/80 (!) 148/72 (!) 148/85  Pulse: (!) 51 (!) 49 (!) 51 (!) 57  Resp: 18 16 19    Temp: 98.8 F (37.1 C) 98.4 F (36.9 C) 98.9 F (37.2 C)   TempSrc: Oral Oral Oral   SpO2: 95% 95% 96%   Weight:  90.5 kg    Height:        General:  Pt is alert, awake, not in acute distress Cardiovascular: RRR, S1/S2 +, no rubs, no gallops Respiratory: CTA bilaterally, no wheezing, no rhonchi Abdominal: Soft, NT, ND, bowel sounds + Extremities: no edema, no cyanosis   The results of significant diagnostics from this hospitalization (including imaging, microbiology, ancillary and laboratory) are listed below for reference.    Microbiology: Recent Results (  from the past 240 hour(s))  SARS CORONAVIRUS 2 (TAT 6-24 HRS) Nasopharyngeal Nasopharyngeal Swab     Status: None   Collection Time: 03/25/21  2:50 PM   Specimen: Nasopharyngeal Swab  Result Value Ref Range Status   SARS Coronavirus 2 NEGATIVE NEGATIVE Final    Comment: (NOTE) SARS-CoV-2 target nucleic acids are NOT DETECTED.  The SARS-CoV-2 RNA is generally detectable in upper and lower respiratory specimens during the acute phase of infection. Negative results do not preclude SARS-CoV-2 infection, do not rule out co-infections with other pathogens, and should not be used as the sole basis for treatment or other patient management decisions. Negative results must be combined with clinical observations, patient history, and epidemiological information. The expected result is Negative.  Fact Sheet for Patients: SugarRoll.be  Fact Sheet for Healthcare Providers: https://www.woods-mathews.com/  This test is not yet approved or cleared by the [place] FDA and  has been authorized for detection and/or diagnosis of SARS-CoV-2 by FDA under an Emergency Use Authorization (EUA). This EUA will remain  in effect (meaning this test can be used) for the duration of the COVID-19 declaration under Se ction 564(b)(1) of the Act, 21 U.S.C. section 360bbb-3(b)(1), unless the authorization is terminated or revoked sooner.  Performed at [facility], [place] [address] [place], [place] [address]      Labs: BNP (last 3 results) No  results for input(s): BNP in the last 8760 hours. Basic Metabolic Panel: Recent Labs  Lab 03/25/21 1228 03/26/21 0316 03/27/21 0426  NA 138 139 137  K 5.0 3.3* 3.2*  CL 106 106 100  CO2 27 27 29   GLUCOSE 121* 110* 99  BUN 20 22 19   CREATININE 1.45* 1.56* 1.52*  CALCIUM 9.8 10.1 10.3  MG  --   --  2.3   Liver Function Tests: No results for input(s): AST, ALT, ALKPHOS, BILITOT, PROT, ALBUMIN in the last 168 hours. No results for input(s): LIPASE, AMYLASE in the last 168 hours. No results for input(s): AMMONIA in the last 168 hours. CBC: Recent Labs  Lab 03/25/21 1228 03/26/21 0316  WBC 5.1 5.2  NEUTROABS 2.5  --   HGB 14.4 14.3  HCT 47.0 45.0  MCV 81.9 80.5  PLT 162 154   Cardiac Enzymes: No results for input(s): CKTOTAL, CKMB, CKMBINDEX, TROPONINI in the last 168 hours. BNP: Invalid input(s): POCBNP CBG: No results for input(s): GLUCAP in the last 168 hours. D-Dimer No results for input(s): DDIMER in the last 72 hours. Hgb A1c No results for input(s): HGBA1C in the last 72 hours. Lipid Profile No results for input(s): CHOL, HDL, LDLCALC, TRIG, CHOLHDL, LDLDIRECT in the last 72 hours. Thyroid function studies Recent Labs    03/25/21 2150  TSH 2.960   Anemia work up No results for input(s): VITAMINB12, FOLATE, FERRITIN, TIBC, IRON, RETICCTPCT in the last 72 hours. Urinalysis    Component Value Date/Time   COLORURINE DARK YELLOW 06/03/2020 1000   APPEARANCEUR CLEAR 06/03/2020 1000   LABSPEC 1.023 06/03/2020 1000   PHURINE 6.0 06/03/2020 1000   GLUCOSEU NEGATIVE 06/03/2020 1000   HGBUR NEGATIVE 06/03/2020 1000   BILIRUBINUR NEGATIVE 10/25/2016 1003   KETONESUR NEGATIVE 06/03/2020 1000   PROTEINUR 2+ (A) 06/03/2020 1000   NITRITE NEGATIVE 06/03/2020 1000   LEUKOCYTESUR NEGATIVE 06/03/2020 1000   Sepsis Labs Invalid input(s): PROCALCITONIN,  WBC,  LACTICIDVEN Microbiology Recent Results (from the past 240 hour(s))  SARS CORONAVIRUS 2 (TAT 6-24 HRS)  Nasopharyngeal Nasopharyngeal Swab  Status: None   Collection Time: 03/25/21  2:50 PM   Specimen: Nasopharyngeal Swab  Result Value Ref Range Status   SARS Coronavirus 2 NEGATIVE NEGATIVE Final    Comment: (NOTE) SARS-CoV-2 target nucleic acids are NOT DETECTED.  The SARS-CoV-2 RNA is generally detectable in upper and lower respiratory specimens during the acute phase of infection. Negative results do not preclude SARS-CoV-2 infection, do not rule out co-infections with other pathogens, and should not be used as the sole basis for treatment or other patient management decisions. Negative results must be combined with clinical observations, patient history, and epidemiological information. The expected result is Negative.  Fact Sheet for Patients: SugarRoll.be  Fact Sheet for Healthcare Providers: https://www.woods-mathews.com/  This test is not yet approved or cleared by the [place] FDA and  has been authorized for detection and/or diagnosis of SARS-CoV-2 by FDA under an Emergency Use Authorization (EUA). This EUA will remain  in effect (meaning this test can be used) for the duration of the COVID-19 declaration under Se ction 564(b)(1) of the Act, 21 U.S.C. section 360bbb-3(b)(1), unless the authorization is terminated or revoked sooner.  Performed at [facility], [place] [address], [place], [place] [address]     Time coordinating discharge: Over 30 minutes  SIGNED:  Lorella Nimrod, MD  [facility] 03/27/2021, 10:34 AM  If 7PM-7AM, please contact night-coverage [email or web address]  This record has been created using Systems analyst. Errors have been sought and corrected,but may not always be located. Such creation errors do not reflect on the standard of care.

## 2021-03-27 NOTE — Plan of Care (Signed)
  Problem: Education: Goal: Knowledge of General Education information will improve Description Including pain rating scale, medication(s)/side effects and non-pharmacologic comfort measures Outcome: Adequate for Discharge   Problem: Health Behavior/Discharge Planning: Goal: Ability to manage health-related needs will improve Outcome: Adequate for Discharge   

## 2021-03-27 NOTE — Plan of Care (Signed)

## 2021-03-31 ENCOUNTER — Telehealth: Payer: Self-pay | Admitting: *Deleted

## 2021-03-31 NOTE — Telephone Encounter (Signed)
Called patient on 03/31/2021 , 10:03 AM in an attempt to reach the patient for a hospital follow up. Spoke with the patient. Admit date: 03/25/21 Discharge: 03/27/21   He does not have any questions or concerns about medications from the hospital admission. The patient's medications were reviewed over the phone, they were counseled to bring in all current medications to the hospital follow up visit.   I advised the patient to call if any questions or concerns arise about the hospital admission or medications. Patient understood the medication changes and had no questions.   Home health was not started in the hospital.  All questions were answered and a follow up appointment was made. Patient has an appointment with Dr Melford Aase on 04/06/2021.  Prior to Admission medications   Medication Sig Start Date End Date Taking? Authorizing Provider  allopurinol (ZYLOPRIM) 300 MG tablet Take 300 mg by mouth daily.    [provider]  amLODipine (NORVASC) 10 MG tablet Take 1 tablet (10 mg total) by mouth daily. 03/28/21   Lorella Nimrod, MD  aspirin 81 MG tablet Take 81 mg by mouth daily.    [provider]  cetirizine (ZYRTEC) 10 MG tablet Take 10 mg by mouth daily in the afternoon.    [provider]  Cholecalciferol (VITAMIN D) 125 MCG (5000 UT) CAPS Take 5,000 Units by mouth daily. 07/10/20   Liane Comber, NP  escitalopram (LEXAPRO) 10 MG tablet Take 10 mg by mouth every evening. 03/06/21   [provider]  hydrochlorothiazide (HYDRODIURIL) 25 MG tablet Take 25 mg by mouth daily. 03/06/21   [provider]  losartan (COZAAR) 100 MG tablet Take 100 mg by mouth daily. 03/06/21   [provider]  LUBRICATING PLUS EYE DROPS 0.5 % SOLN Apply 1 drop to eye 2 (two) times daily. 11/10/20   [provider]  nitroGLYCERIN (NITROSTAT) 0.4 MG SL tablet Place 0.4 mg under the tongue every 5 (five) minutes as needed for chest pain.    [provider]   potassium chloride SA (KLOR-CON) 20 MEQ tablet Take    1 tablet      2 x /day      for Potassium Patient taking differently: Take 20 mEq by mouth 2 (two) times daily. 08/29/20   Unk Pinto, MD  propranolol (INDERAL) 10 MG tablet Take 10 mg by mouth as needed (anxiety).    [provider]  pyridOXINE (VITAMIN B-6) 50 MG tablet Take 50 mg by mouth daily.      [provider]  risperiDONE (RISPERDAL) 2 MG tablet Take 1 mg by mouth at bedtime.    [provider]  rosuvastatin (CRESTOR) 40 MG tablet Take 40 mg by mouth at bedtime. 08/26/20   [provider]  tamsulosin (FLOMAX) 0.4 MG CAPS capsule Take 0.4 mg by mouth at bedtime.    [provider]  zolpidem (AMBIEN) 10 MG tablet Take 1 tablet (10 mg total) by mouth at bedtime. 12/25/20   Garnet Sierras, NP

## 2021-04-01 ENCOUNTER — Telehealth: Payer: Self-pay | Admitting: *Deleted

## 2021-04-01 NOTE — Telephone Encounter (Signed)
   [facility] [place] Pre-operative Risk Assessment    Patient Name: [name] Colon  DOB: 11-Jul-1941  MRN: [id number]   Request for surgical clearance:  1. What type of surgery is being performed? Dental extractions-6 teeth   2. When is this surgery scheduled? TBD   3. What type of clearance is required (medical clearance vs. Pharmacy clearance to hold med vs. Both)? both  4. Are there any medications that need to be held prior to surgery and how long? ASA   5. Practice name and name of physician performing surgery? [facility]alle[facility] Lange DDS   6. What is the office phone number? [phone]   7.   What is the office fax number? [phone]  8.   Anesthesia type (None, local, MAC, general) ? Local with epinephrine   [name] Colon 04/01/2021, 4:11 PM  _________________________________________________________________   (provider comments below)

## 2021-04-02 NOTE — Telephone Encounter (Signed)
   Name:  [name] Colon  DOB:  07-01-1941  MRN:  [id number]   Primary Cardiologist: Minus Breeding, MD  Chart reviewed as part of pre-operative protocol coverage.  [name] Colon was last seen on 06/02/20 by Jory Sims, NP via phone visit.  Since that day, [name] Colon was hospitalized 03/25/21 due to syncope due to bradycardia caused by taking both Atenolol and Propranolol.   [name] Colon has office visit with Dr. Percival Spanish scheduled for 04/06/21 and his preoperative clearance can be addressed at that visit. Appointment notes have been updated.   I will contact requesting surgeon's office via [place] Fax function to inform them of need for appointment prior to surgery.  Loel Dubonnet, NP 04/02/2021, 9:12 AM
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
## 2021-04-05 NOTE — Patient Instructions (Signed)

## 2021-04-05 NOTE — Progress Notes (Signed)
Future Appointments  Date Time Provider [place]  04/06/2021  9:00 AM Minus Breeding, MD [facility]  04/06/2021  3:30 PM Unk Pinto, MD [facility] None  04/21/2021 10:00 AM Unk Pinto, MD [facility] None  12/29/2021  9:30 AM Garnet Sierras, NP [facility] None    [facility] Follow-Up     This very nice [name] was admitted to the hospital on  03/25/2021  and patient was discharged from the hospital 10 days ago on 03/27/2021. The patient now presents for follow up for transition from recent hospitalization.  The day after discharge  our clinical staff contacted the patient to assure stability and schedule a follow up appointment. The discharge summary, medications and diagnostic test results were reviewed before meeting with the patient. The patient was admitted for:   Syncope Sinus bradycardia Hyperlipidemia associated with type 2 diabetes mellitus  Essential hypertension Diet-controlled diabetes mellitus  PTSD  Type 2 diabetes mellitus with stage 3a chronic kidney disease       On the morning of Admission , patient was discovered by wife with suspected syncopal episode, confusion and was brought to the ER . Patient was bradycardic consequent of taking both Atenolol & Propanolol.  His Atenolol was  discontinued and his prn Propranolol was left prn Anxiety.  His home Amlodipine was increased from 5 up to 10 mg for BP control.  Acute ischemia and CVA were ruled out. Potassium was low at 3.2 & BMET was done earlier today at Dr Hochrein's office.       Hospitalization discharge instructions and medications are reconciled with the patient.      Patient is also followed with Hypertension, Hyperlipidemia, T2_DM/CKD2,  and Vitamin D Deficiency.      Patient is treated for HTN circa 1990 & BP has been controlled at home. Today's BP: 124/80.  In 2009, he had a stent placed & in 2017 , he had a negative Lexiscan. Patient has had no complaints of any cardiac  type chest pain, palpitations, dyspnea/orthopnea/PND, dizziness, claudication, or dependent edema.     Hyperlipidemia is controlled with diet & meds. Patient denies myalgias or other med SE's. Last Lipids were  Lab Results  Component Value Date   CHOL 111 01/02/2021   HDL 42 01/02/2021   LDLCALC 54 01/02/2021   TRIG 70 01/02/2021   CHOLHDL 2.6 01/02/2021      Also, the patient has history of  Diet controlled T2_DM w/CKD3a and has had no symptoms of reactive hypoglycemia, diabetic polys, paresthesias or visual blurring.  Last A1c was not at goal: Lab Results  Component Value Date   HGBA1C 6.4 (H) 09/09/2020      Further, the patient also has history of Vitamin D Deficiency and supplements vitamin D without any suspected side-effects. Last vitamin D was  elevated and dose was decreased from 5,000  units daily to qod.  Lab Results  Component Value Date   VD25OH 137 (H) 09/09/2020   Current Outpatient Medications on File Prior to Visit  Medication Sig  . allopurinol (ZYLOPRIM) 300 MG tablet Take 300 mg by mouth daily.  Marland Kitchen amLODipine (NORVASC) 10 MG tablet Take 1 tablet (10 mg total) by mouth daily.  Marland Kitchen aspirin 81 MG tablet Take 81 mg by mouth daily.  . cetirizine (ZYRTEC) 10 MG tablet Take 10 mg by mouth daily in the afternoon.  . Cholecalciferol (VITAMIN D) 125 MCG (5000 UT) CAPS Take 5,000 Units by mouth daily.  Marland Kitchen escitalopram (LEXAPRO) 10 MG tablet  Take 10 mg by mouth every evening.  . hydrochlorothiazide (HYDRODIURIL) 25 MG tablet Take 25 mg by mouth daily.  Marland Kitchen losartan (COZAAR) 100 MG tablet Take 100 mg by mouth daily.  . LUBRICATING PLUS EYE DROPS 0.5 % SOLN Apply 1 drop to eye 2 (two) times daily.  . nitroGLYCERIN (NITROSTAT) 0.4 MG SL tablet Place 0.4 mg under the tongue every 5 (five) minutes as needed for chest pain.  . potassium chloride SA (KLOR-CON) 20 MEQ tablet Take    1 tablet      2 x /day      for Potassium (Patient taking differently: Take 20 mEq by mouth 2 (two) times  daily.)  . propranolol (INDERAL) 10 MG tablet Take 10 mg by mouth as needed (anxiety).  . pyridOXINE (VITAMIN B-6) 50 MG tablet Take 50 mg by mouth daily.    . risperiDONE (RISPERDAL) 2 MG tablet Take 1 mg by mouth at bedtime.  . rosuvastatin (CRESTOR) 40 MG tablet Take 40 mg by mouth at bedtime.  . tamsulosin (FLOMAX) 0.4 MG CAPS capsule Take 0.4 mg by mouth at bedtime.  Marland Kitchen zolpidem (AMBIEN) 10 MG tablet Take 1 tablet (10 mg total) by mouth at bedtime.   No current facility-administered medications on file prior to visit.   No Known Allergies  PMHx:   Past Medical History:  Diagnosis Date  . 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 Arthritis   . Axillary abscess    left  . CAD (coronary artery disease)   . Diverticulitis, colon   . History of colonic polyps   . HTN (hypertension)   . Hyperglycemia    diet controlled  . Hyperlipidemia   . Radiation proctitis    with chronic bleeding   Immunization History  Administered Date(s) Administered  . Fluad Quad(high Dose 65+) 09/02/2020  . Influenza, High Dose Seasonal PF 09/08/2018, 07/24/2019  . Influenza  08/22/2014, 09/03/2015, 08/25/2016  . Moderna Sars-Covid-2 Vacci 12/06/2019, 01/03/2020  . Pneumococcal Conjugate-13 09/03/2015  . Pneumococcal-23 08/25/2016  . Td 11/22/2006   Past Surgical History:  Procedure Laterality Date  . COLONOSCOPY    . COLONOSCOPY W/ POLYPECTOMY    . CORONARY ANGIOPLASTY WITH STENT PLACEMENT  04/2008  . MASS EXCISION Left 10/06/2017   Procedure: EXCISION LEFT AXILLA CYST;  Surgeon: Erroll Luna, MD;  Location: [place];  Service: General;  Laterality: Left;   FHx:    Reviewed / unchanged  SHx:    Reviewed / unchanged  Systems Review:  Constitutional: Denies fever, chills, wt changes, headaches, insomnia, fatigue, night sweats, change in appetite. Eyes: Denies redness, blurred vision, diplopia, discharge, itchy, watery eyes.  ENT: Denies discharge,  congestion, post nasal drip, epistaxis, sore throat, earache, hearing loss, dental pain, tinnitus, vertigo, sinus pain, snoring.  CV: Denies chest pain, palpitations, irregular heartbeat, syncope, dyspnea, diaphoresis, orthopnea, PND, claudication or edema. Respiratory: denies cough, dyspnea, DOE, pleurisy, hoarseness, laryngitis, wheezing.  Gastrointestinal: Denies dysphagia, odynophagia, heartburn, reflux, water brash, abdominal pain or cramps, nausea, vomiting, bloating, diarrhea, constipation, hematemesis, melena, hematochezia  or hemorrhoids. Genitourinary: Denies dysuria, frequency, urgency, nocturia, hesitancy, discharge, hematuria or flank pain. Musculoskeletal: Denies arthralgias, myalgias, stiffness, jt. swelling, pain, limping or strain/sprain.  Skin: Denies pruritus, rash, hives, warts, acne, eczema or change in skin lesion(s). Neuro: No weakness, tremor, incoordination, spasms, paresthesia or pain. Psychiatric: Denies confusion, memory loss or sensory loss. Endo: Denies change in weight, skin or hair change.  Heme/Lymph: No excessive bleeding, bruising  or enlarged lymph nodes.  Physical Exam  BP 124/80   Pulse 72   Temp (!) 97.3 F (36.3 C)   Ht 5\' 9"  (1.753 m)   Wt 201 lb (91.2 kg)   SpO2 97%   BMI 29.68 kg/m   Postural       sitting BP  159/97    P 72             &            Standing BP 144/91    P 82   Appears well nourished, well groomed  and in no distress.  Eyes: PERRLA, EOMs, conjunctiva no swelling or erythema. Sinuses: No frontal/maxillary tenderness ENT/Mouth: EAC's clear, TM's nl w/o erythema, bulging. Nares clear w/o erythema, swelling, exudates. Oropharynx clear without erythema or exudates. Oral hygiene is good. Tongue normal, non obstructing. Hearing intact.  Neck: Supple. Thyroid nl. Car 2+/2+ without bruits, nodes or JVD. Chest: Respirations nl with BS clear & equal w/o rales, rhonchi, wheezing or stridor.  Cor: Heart sounds normal w/ regular rate and  rhythm without sig. murmurs, gallops, clicks or rubs. Peripheral pulses normal and equal  without edema.  Abdomen: Soft & bowel sounds normal. Non-tender w/o guarding, rebound, hernias, masses or organomegaly.  Lymphatics: Unremarkable.  Musculoskeletal: Full ROM all peripheral extremities, joint stability, 5/5 strength and normal gait.  Skin: Warm, dry without exposed rashes, lesions or ecchymosis apparent.  Neuro: Cranial nerves intact, reflexes equal bilaterally. Sensory-motor testing grossly intact. Tendon reflexes grossly intact.  Pysch: Alert & oriented x 3.  Insight and judgement nl & appropriate. No ideations.  Assessment and Plan:  1. Syncope  - CBC with Differential/Platelet - COMPLETE METABOLIC PANEL WITH GFR  2. Sinus bradycardia   3. Hypokalemia  - COMPLETE METABOLIC PANEL WITH GFR  4. Hyperlipidemia associated with type 2 diabetes mellitus ([place])  - Continue diet/meds, exercise,& lifestyle modifications.  - Continue monitor periodic cholesterol/liver & renal functions    5. Essential hypertension  - Continue medication, monitor blood pressure at home.  - Continue DASH diet.  Reminder to go to the ER if any CP,  SOB, nausea, dizziness, severe HA, changes vision/speech.  - CBC with Differential/Platelet - COMPLETE METABOLIC PANEL WITH GFR  6. Diet-controlled diabetes mellitus (HCC)  - Continue diet, exercise, lifestyle modifications.  - Monitor appropriate labs.  7. PTSD (post-traumatic stress disorder)   8. Type 2 diabetes mellitus with stage 3a chronic kidney disease, without long-term current use of insulin (HCC)  - COMPLETE METABOLIC PANEL WITH GFR  9. Medication management  - CBC with Differential/Platelet - COMPLETE METABOLIC PANEL WITH GFR        Discussed  regular exercise, BP monitoring - 2 x /day at home, weight control to achieve/maintain BMI less than 25 and discussed meds and SE's. Recommended labs to assess and monitor clinical status  with further disposition pending results of labs. Over 30 minutes of exam, counseling, chart review was performed.   Kirtland Bouchard, MD

## 2021-04-06 ENCOUNTER — Encounter: Payer: Self-pay | Admitting: Cardiology

## 2021-04-06 ENCOUNTER — Ambulatory Visit (INDEPENDENT_AMBULATORY_CARE_PROVIDER_SITE_OTHER): Payer: Medicare Other | Admitting: Cardiology

## 2021-04-06 ENCOUNTER — Other Ambulatory Visit: Payer: Self-pay

## 2021-04-06 ENCOUNTER — Ambulatory Visit (INDEPENDENT_AMBULATORY_CARE_PROVIDER_SITE_OTHER): Payer: Medicare Other | Admitting: Internal Medicine

## 2021-04-06 ENCOUNTER — Encounter: Payer: Self-pay | Admitting: Internal Medicine

## 2021-04-06 VITALS — BP 140/84 | HR 95 | Ht 69.0 in | Wt 201.0 lb

## 2021-04-06 VITALS — BP 124/80 | HR 72 | Temp 97.3°F | Ht 69.0 in | Wt 201.0 lb

## 2021-04-06 DIAGNOSIS — R55 Syncope and collapse: Secondary | ICD-10-CM

## 2021-04-06 DIAGNOSIS — E785 Hyperlipidemia, unspecified: Secondary | ICD-10-CM

## 2021-04-06 DIAGNOSIS — N1831 Chronic kidney disease, stage 3a: Secondary | ICD-10-CM

## 2021-04-06 DIAGNOSIS — I10 Essential (primary) hypertension: Secondary | ICD-10-CM

## 2021-04-06 DIAGNOSIS — E1169 Type 2 diabetes mellitus with other specified complication: Secondary | ICD-10-CM | POA: Diagnosis not present

## 2021-04-06 DIAGNOSIS — E876 Hypokalemia: Secondary | ICD-10-CM

## 2021-04-06 DIAGNOSIS — E119 Type 2 diabetes mellitus without complications: Secondary | ICD-10-CM

## 2021-04-06 DIAGNOSIS — F4310 Post-traumatic stress disorder, unspecified: Secondary | ICD-10-CM

## 2021-04-06 DIAGNOSIS — R001 Bradycardia, unspecified: Secondary | ICD-10-CM | POA: Diagnosis not present

## 2021-04-06 DIAGNOSIS — I2510 Atherosclerotic heart disease of native coronary artery without angina pectoris: Secondary | ICD-10-CM

## 2021-04-06 DIAGNOSIS — Z79899 Other long term (current) drug therapy: Secondary | ICD-10-CM

## 2021-04-06 DIAGNOSIS — E1122 Type 2 diabetes mellitus with diabetic chronic kidney disease: Secondary | ICD-10-CM

## 2021-04-06 LAB — BASIC METABOLIC PANEL
BUN/Creatinine Ratio: 11 (ref 10–24)
BUN: 18 mg/dL (ref 8–27)
CO2: 20 mmol/L (ref 20–29)
Calcium: 11.8 mg/dL — ABNORMAL HIGH (ref 8.6–10.2)
Chloride: 99 mmol/L (ref 96–106)
Creatinine, Ser: 1.66 mg/dL — ABNORMAL HIGH (ref 0.76–1.27)
Glucose: 159 mg/dL — ABNORMAL HIGH (ref 65–99)
Potassium: 3.6 mmol/L (ref 3.5–5.2)
Sodium: 139 mmol/L (ref 134–144)
eGFR: 41 mL/min/{1.73_m2} — ABNORMAL LOW (ref >59)

## 2021-04-06 NOTE — Patient Instructions (Signed)
Medication Instructions:  Your physician recommends that you continue on your current medications as directed. Please refer to the Current Medication list given to you today.   *If you need a refill on your cardiac medications before your next appointment, please call your pharmacy*  Lab Work: BMET TODAY   If you have labs (blood work) drawn today and your tests are completely normal, you will receive your results only by: Marland Kitchen MyChart Message (if you have MyChart) OR . A paper copy in the mail If you have any lab test that is abnormal or we need to change your treatment, we will call you to review the results.  Testing/Procedures: NONE  Follow-Up: At [facility], you and your health needs are our priority.  As part of our continuing mission to provide you with exceptional heart care, we have created designated Provider Care Teams.  These Care Teams include your primary Cardiologist (physician) and Advanced Practice Providers (APPs -  Physician Assistants and Nurse Practitioners) who all work together to provide you with the care you need, when you need it.  We recommend signing up for the patient portal called "MyChart".  Sign up information is provided on this After Visit Summary.  MyChart is used to connect with patients for Virtual Visits (Telemedicine).  Patients are able to view lab/test results, encounter notes, upcoming appointments, etc.  Non-urgent messages can be sent to your provider as well.   To learn more about what you can do with MyChart, go to [email or web address].    Your next appointment:   12 month(s)  The format for your next appointment:   In Person  Provider:   You may see Minus Breeding, MD or one of the following Advanced Practice Providers on your designated Care Team:    Rosaria Ferries, PA-C  Jory Sims, DNP, ANP

## 2021-04-07 LAB — CBC WITH DIFFERENTIAL/PLATELET
Absolute Monocytes: 855 cells/uL (ref 200–950)
Basophils Absolute: 23 cells/uL (ref 0–200)
Basophils Relative: 0.3 %
Eosinophils Absolute: 90 cells/uL (ref 15–500)
Eosinophils Relative: 1.2 %
HCT: 49.7 % (ref 38.5–50.0)
Hemoglobin: 15.6 g/dL (ref 13.2–17.1)
Lymphs Abs: 2213 cells/uL (ref 850–3900)
MCH: 25 pg — ABNORMAL LOW (ref 27.0–33.0)
MCHC: 31.4 g/dL — ABNORMAL LOW (ref 32.0–36.0)
MCV: 79.6 fL — ABNORMAL LOW (ref 80.0–100.0)
MPV: 11 fL (ref 7.5–12.5)
Monocytes Relative: 11.4 %
Neutro Abs: 4320 cells/uL (ref 1500–7800)
Neutrophils Relative %: 57.6 %
Platelets: 201 10*3/uL (ref 140–400)
RBC: 6.24 10*6/uL — ABNORMAL HIGH (ref 4.20–5.80)
RDW: 15.7 % — ABNORMAL HIGH (ref 11.0–15.0)
Total Lymphocyte: 29.5 %
WBC: 7.5 10*3/uL (ref 3.8–10.8)

## 2021-04-07 LAB — COMPLETE METABOLIC PANEL WITH GFR
AG Ratio: 1.7 (calc) (ref 1.0–2.5)
ALT: 22 U/L (ref 9–46)
AST: 21 U/L (ref 10–35)
Albumin: 4.8 g/dL (ref 3.6–5.1)
Alkaline phosphatase (APISO): 59 U/L (ref 35–144)
BUN/Creatinine Ratio: 10 (calc) (ref 6–22)
BUN: 18 mg/dL (ref 7–25)
CO2: 29 mmol/L (ref 20–32)
Calcium: 11.9 mg/dL — ABNORMAL HIGH (ref 8.6–10.3)
Chloride: 103 mmol/L (ref 98–110)
Creat: 1.74 mg/dL — ABNORMAL HIGH (ref 0.70–1.11)
GFR, Est African American: 42 mL/min/{1.73_m2} — ABNORMAL LOW (ref >=60)
GFR, Est Non African American: 36 mL/min/{1.73_m2} — ABNORMAL LOW (ref >=60)
Globulin: 2.8 g/dL (calc) (ref 1.9–3.7)
Glucose, Bld: 98 mg/dL (ref 65–99)
Potassium: 3.9 mmol/L (ref 3.5–5.3)
Sodium: 141 mmol/L (ref 135–146)
Total Bilirubin: 0.6 mg/dL (ref 0.2–1.2)
Total Protein: 7.6 g/dL (ref 6.1–8.1)

## 2021-04-07 NOTE — Progress Notes (Signed)
============================================================ ============================================================  -    Kidney functions - Still Stage 3b and Stable - Great  ============================================================ ============================================================  - All Else - CBC - Electrolytes - Liver     - all  Normal / OK ===========================================================

## 2021-04-17 ENCOUNTER — Telehealth: Payer: Self-pay | Admitting: Cardiology

## 2021-04-17 NOTE — Telephone Encounter (Signed)
I seen you seen the patient on May 16th, but did not see any update on the clearance. Will route to MD to advise and I can route message to the dental office.   Thank you!

## 2021-04-17 NOTE — Telephone Encounter (Signed)
Called to see when the patient had his appt on May 16th if Dr. Percival Spanish approve him for clearance. Please advise and fax information over [phone]

## 2021-04-19 NOTE — Telephone Encounter (Signed)
He is OK to have his procedure but I do not want him to stop the ASA if at all possible.  Call [name] Colon with the results and send results to Unk Pinto, MD

## 2021-04-20 ENCOUNTER — Encounter: Payer: Self-pay | Admitting: Internal Medicine

## 2021-04-20 NOTE — Progress Notes (Addendum)
NO SHOW

## 2021-04-21 ENCOUNTER — Ambulatory Visit: Payer: Medicare Other | Admitting: Internal Medicine

## 2021-04-21 DIAGNOSIS — F4310 Post-traumatic stress disorder, unspecified: Secondary | ICD-10-CM

## 2021-04-21 DIAGNOSIS — Z136 Encounter for screening for cardiovascular disorders: Secondary | ICD-10-CM

## 2021-04-21 DIAGNOSIS — E559 Vitamin D deficiency, unspecified: Secondary | ICD-10-CM

## 2021-04-21 DIAGNOSIS — Z8249 Family history of ischemic heart disease and other diseases of the circulatory system: Secondary | ICD-10-CM

## 2021-04-21 DIAGNOSIS — E1122 Type 2 diabetes mellitus with diabetic chronic kidney disease: Secondary | ICD-10-CM

## 2021-04-21 DIAGNOSIS — I10 Essential (primary) hypertension: Secondary | ICD-10-CM

## 2021-04-21 DIAGNOSIS — Z1211 Encounter for screening for malignant neoplasm of colon: Secondary | ICD-10-CM

## 2021-04-21 DIAGNOSIS — I2510 Atherosclerotic heart disease of native coronary artery without angina pectoris: Secondary | ICD-10-CM

## 2021-04-21 DIAGNOSIS — Z125 Encounter for screening for malignant neoplasm of prostate: Secondary | ICD-10-CM

## 2021-04-21 DIAGNOSIS — Z8546 Personal history of malignant neoplasm of prostate: Secondary | ICD-10-CM

## 2021-04-21 DIAGNOSIS — Z79899 Other long term (current) drug therapy: Secondary | ICD-10-CM

## 2021-04-21 DIAGNOSIS — N2581 Secondary hyperparathyroidism of renal origin: Secondary | ICD-10-CM

## 2021-04-21 DIAGNOSIS — E1169 Type 2 diabetes mellitus with other specified complication: Secondary | ICD-10-CM

## 2021-04-21 DIAGNOSIS — M1000 Idiopathic gout, unspecified site: Secondary | ICD-10-CM

## 2021-04-21 DIAGNOSIS — Z87891 Personal history of nicotine dependence: Secondary | ICD-10-CM

## 2021-04-21 NOTE — Telephone Encounter (Signed)
This note will be faxed to [facility].

## 2021-05-12 NOTE — Patient Instructions (Signed)

## 2021-05-12 NOTE — Progress Notes (Signed)
Comprehensive Evaluation & Examination  Future Appointments  Date Time Provider [place]  05/13/2021 10:00 AM [name] Pinto, MD [facility] None  07/13/2021  9:20 AM Minus Breeding, MD [facility]  12/29/2021  9:30 AM [name] Sierras, NP [facility] None  05/13/2022 10:00 AM [name] Pinto, MD [facility] None            This very nice [name] presents for a comprehensive evaluation and management of multiple medical co-morbidities.  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 Patient has hx/o Gout controlled on Allopurinol. Patient has hx/o Prostate Cancer treated by Brachytherapy in 2004.                                                  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       Patient was recently hospitalized 05/04-04/2021 for Syncope attributed to Sinus Bradycardia and patient is being followed by Dr Percival Spanish.  HTN predates since 70. Patient's BP has been controlled at home.Today's BP is at goal - 120/68.    Patient had Coronary PCA with a Stent planted in 2009 & he had a negative Lexiscan 2017.  Patient denies any cardiac symptoms as chest pain, palpitations, shortness of breath, dizziness or ankle swelling.       Patient's hyperlipidemia is controlled with diet and medications. Patient denies myalgias or other medication SE's. 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   Patient denies reactive hypoglycemic symptoms, visual blurring, diabetic polys or paresthesias. Last A1c was not at goal:   Lab Results  Component Value Date    HGBA1C 6.4 (H) 09/09/2020          Finally, patient has history of Vitamin D Deficiency  ("38" / 2012)    and last vitamin D was elevated & his dose was decreased .    Lab Results  Component Value Date   VD25OH 137 (H) 09/09/2020     Current Outpatient Medications on File Prior to Visit  Medication Sig   allopurinol 300 MG tablet Take daily.   amLODipine 10 MG tablet Take 1 tablet daily.   aspirin 81 MG tablet Take  daily.   cetirizine  10 MG tablet Take daily in the afternoon.   VITAMIN D 5000 u Take  daily.   Escitalopram 10 MG tablet Take every evening.   hydrochlorothiazide 25 MG tablet Take daily.   losartan  100 MG tablet Take daily.   LUBRICATING EYE DROPS SOLN Apply 1 drop to eye 2 times daily.   NITROSTAT)0.4 MG SL tablet as needed for chest pain.   potassium chloride 20 MEQ tablet Take 1 tablet 2 x /day     propranolol 10 MG tablet Take as needed (anxiety).   pyridOXINE VIT, B-650 MG tablet Take  daily.     risperiDONE 2 MG tablet Take at bedtime.   rosuvastatin 40 MG tablet Take 4at  bedtime.   tamsulosin 0.4 MG CAPS capsule Take at bedtime.   zolpidem 10 MG tablet Take 1 tablet at bedtime.     No Known Allergies   Health Maintenance  Topic Date Due   Zoster Vaccines- Shingrix (1 of 2) Never done   OPHTHALMOLOGY EXAM  04/27/2018   COVID-19 Vaccine (3 - Moderna risk series) 01/31/2020   COLONOSCOPY (Pts 45-45yrs Insurance coverage will need to be confirmed)  02/22/2021   FOOT EXAM  02/24/2021   HEMOGLOBIN A1C  03/10/2021   INFLUENZA VACCINE  06/22/2021   TETANUS/TDAP  03/17/2027   PNA vac Low Risk Adult  Completed   HPV VACCINES  Aged Out     Immunization History  Administered Date(s) Administered   Fluad Quad(high Dose 65+) 09/02/2020   Influenza, High Dose Seasonal PF 09/08/2018, 07/24/2019   Influenza-Unspecified 08/22/2014, 09/03/2015, 08/25/2016   Moderna Sars-Covid-2 Vaccination 12/06/2019, 01/03/2020   Pneumococcal Conjugate-13 09/03/2015    Pneumococcal-Unspecified 08/25/2016   Td 11/22/2006    Last Colon -  02/22/2018 - Dr Henrene Pastor - Multiple Polyps - recc 3 yr f/u if medically stable - due Apr 2022  Past Surgical History:  Procedure Laterality Date   COLONOSCOPY     COLONOSCOPY W/ POLYPECTOMY     CORONARY ANGIOPLASTY WITH STENT PLACEMENT  04/2008   MASS EXCISION Left 10/06/2017   Procedure: EXCISION LEFT AXILLA CYST;  Surgeon: Erroll Luna, MD;  Location: [place];  Service: General;  Laterality: Left;     Family History  Problem Relation Age of Onset   Heart disease Father    Hypertension Father    Hypotension Mother    Kidney disease Brother    Colon cancer Neg Hx     Social History   Socioeconomic History   Marital status: Married    Spouse name: Not on file   Number of children: 2  Occupational History   Occupation: retired    Fish farm manager: Retired  Tobacco Use   Smoking status: Former    Pack years: 0.00    Types: Cigarettes    Quit date: 11/22/1992    Years since quitting: 28.4   Smokeless tobacco: Never  Substance and Sexual Activity   Alcohol use: No   Drug use: No   Sexual activity: Not Currently    ROS Constitutional: Denies fever, chills, weight loss/gain, headaches, insomnia,  night sweats or change in appetite. Does c/o fatigue. Eyes: Denies redness, blurred vision, diplopia, discharge, itchy or watery eyes.  ENT: Denies discharge, congestion, post nasal drip, epistaxis, sore throat, earache, hearing loss, dental pain, Tinnitus, Vertigo, Sinus pain or snoring.  Cardio: Denies chest pain, palpitations, irregular heartbeat, syncope, dyspnea, diaphoresis, orthopnea, PND, claudication or edema Respiratory: denies cough, dyspnea, DOE, pleurisy, hoarseness, laryngitis or wheezing.  Gastrointestinal: Denies dysphagia, heartburn, reflux, water brash, pain, cramps, nausea, vomiting, bloating, diarrhea, constipation, hematemesis, melena, hematochezia, jaundice or  hemorrhoids Genitourinary: Denies dysuria, frequency, urgency, nocturia, hesitancy, discharge, hematuria or flank pain Musculoskeletal: Denies arthralgia, myalgia, stiffness, Jt. Swelling, pain, limp or strain/sprain. Denies Falls. Skin: Denies puritis, rash, hives, warts, acne, eczema or change in skin lesion Neuro: No weakness, tremor, incoordination, spasms, paresthesia or pain Psychiatric: Denies confusion, memory loss or sensory loss. Denies Depression. Endocrine: Denies change in weight, skin, hair change, nocturia, and paresthesia, diabetic polys, visual blurring or hyper / hypo glycemic episodes.  Heme/Lymph: No excessive bleeding, bruising or enlarged lymph nodes.   Physical Exam  BP (P) 120/68   Pulse (P) 90   Temp (  P) 97.6 F (36.4 C)   Resp (P) 17   Ht (P) 5\' 9"  (1.753 m)   Wt (P) 197 lb 3.2 oz (89.4 kg)   SpO2 (P) 98%   BMI (P) 29.12 kg/m   General Appearance: Well nourished and well groomed and in no apparent distress.  Eyes: PERRLA, EOMs, conjunctiva no swelling or erythema, normal fundi and vessels. Sinuses: No frontal/maxillary tenderness ENT/Mouth: EACs patent / TMs  nl. Nares clear without erythema, swelling, mucoid exudates. Oral hygiene is good. No erythema, swelling, or exudate. Tongue normal, non-obstructing. Tonsils not swollen or erythematous. Hearing normal.  Neck: Supple, thyroid not palpable. No bruits, nodes or JVD. Respiratory: Respiratory effort normal.  BS equal and clear bilateral without rales, rhonci, wheezing or stridor. Cardio: Heart sounds are normal with regular rate and rhythm and no murmurs, rubs or gallops. Peripheral pulses are normal and equal bilaterally without edema. No aortic or femoral bruits. Chest: symmetric with normal excursions and percussion.  Abdomen: Soft, with Nl bowel sounds. Nontender, no guarding, rebound, hernias, masses, or organomegaly.  Lymphatics: Non tender without lymphadenopathy.  Musculoskeletal: Full ROM all  peripheral extremities, joint stability, 5/5 strength, and normal gait. Skin: Warm and dry without rashes, lesions, cyanosis, clubbing or  ecchymosis.  Neuro: Cranial nerves intact, reflexes equal bilaterally. Normal muscle tone, no cerebellar symptoms. Sensation intact.  Pysch: Alert and oriented X 3 with normal affect, insight and judgment appropriate.   Assessment and Plan   1. Essential hypertension  - EKG 12-Lead - [place], RETROPERITNL ABD,  LTD - Microalbumin / creatinine urine ratio - CBC with Differential/Platelet - COMPLETE METABOLIC PANEL WITH GFR - Magnesium - TSH  2. Hyperlipidemia associated with type 2 diabetes mellitus ([place])  - EKG 12-Lead - [place], RETROPERITNL ABD,  LTD - Lipid panel - TSH  3. Type 2 diabetes mellitus with stage 3a chronic kidney disease, without long-term current use of insulin (HCC)  - EKG 12-Lead - [place], RETROPERITNL ABD,  LTD - HM DIABETES FOOT EXAM - LOW EXTREMITY NEUR EXAM DOCUM - PTH, intact and calcium - Hemoglobin A1c  4. Vitamin D deficiency  - Hemoglobin A1c - VITAMIN D 25 Hydroxy (Vit-D Deficiency, Fractures)  5. Diet-controlled diabetes mellitus ([place])  - Hemoglobin A1c - Insulin, random  6. Secondary hyperparathyroidism of renal origin (HCC)  - PTH, intact and calcium  7. Atherosclerosis of native coronary artery of native heart without angina pectoris  - EKG 12-Lead - [place], RETROPERITNL ABD,  LTD  8. Idiopathic gout  - Uric acid  9. FHx: heart disease  - EKG 12-Lead - [place], RETROPERITNL ABD,  LTD - PSA  10. Former smoker  - EKG 12-Lead - [place], RETROPERITNL ABD,  LTD  11. Screening for ischemic heart disease  - EKG 12-Lead  12. Screening for AAA (aortic abdominal aneurysm)  - [place], RETROPERITNL ABD,  LTD  13. History of prostate cancer  - PSA  14. Screening for colorectal cancer  - POC Hemoccult Bld/Stl  ;;;;;;;;;;;;;;;;;;;;;;;;;;;;;;;;;;;;;;;;;;;;;;;;;;;;;;;;;;;;;;;;;;;;;;;;;;;;;;;;;;;;;;;;;;;;;;;;;;;;;;;;;;;;;;;;;;;;;;;;;;;;;;;;;;;;;;;;;;;;;;;;;;;;;;;;;;;;;;;;;;;;;;;;;;;;;;;;;;;;;;;;;;  15. Medication management  - Uric acid - PTH, intact and calcium - CBC with Differential/Platelet - COMPLETE METABOLIC PANEL WITH GFR - Magnesium - Lipid panel - TSH - Hemoglobin A1c      Patient was counseled in prudent diet, weight control to achieve/maintain BMI less than 25, BP monitoring, regular exercise and medications as discussed.  Discussed med effects and SE's. Routine screening labs and tests as requested with regular follow-up as recommended. Over 40 minutes of exam, counseling, chart review and high  complex critical decision making was performed   Kirtland Bouchard, MD

## 2021-05-13 ENCOUNTER — Other Ambulatory Visit: Payer: Self-pay

## 2021-05-13 ENCOUNTER — Ambulatory Visit (INDEPENDENT_AMBULATORY_CARE_PROVIDER_SITE_OTHER): Payer: Medicare Other | Admitting: Internal Medicine

## 2021-05-13 ENCOUNTER — Encounter: Payer: Self-pay | Admitting: Internal Medicine

## 2021-05-13 ENCOUNTER — Other Ambulatory Visit: Payer: Self-pay | Admitting: Adult Health

## 2021-05-13 ENCOUNTER — Telehealth: Payer: Self-pay

## 2021-05-13 DIAGNOSIS — N2581 Secondary hyperparathyroidism of renal origin: Secondary | ICD-10-CM

## 2021-05-13 DIAGNOSIS — Z79899 Other long term (current) drug therapy: Secondary | ICD-10-CM

## 2021-05-13 DIAGNOSIS — E119 Type 2 diabetes mellitus without complications: Secondary | ICD-10-CM

## 2021-05-13 DIAGNOSIS — E1122 Type 2 diabetes mellitus with diabetic chronic kidney disease: Secondary | ICD-10-CM

## 2021-05-13 DIAGNOSIS — E559 Vitamin D deficiency, unspecified: Secondary | ICD-10-CM

## 2021-05-13 DIAGNOSIS — Z1211 Encounter for screening for malignant neoplasm of colon: Secondary | ICD-10-CM

## 2021-05-13 DIAGNOSIS — F5101 Primary insomnia: Secondary | ICD-10-CM

## 2021-05-13 DIAGNOSIS — I10 Essential (primary) hypertension: Secondary | ICD-10-CM

## 2021-05-13 DIAGNOSIS — E1169 Type 2 diabetes mellitus with other specified complication: Secondary | ICD-10-CM | POA: Diagnosis not present

## 2021-05-13 DIAGNOSIS — N1831 Chronic kidney disease, stage 3a: Secondary | ICD-10-CM

## 2021-05-13 DIAGNOSIS — Z8546 Personal history of malignant neoplasm of prostate: Secondary | ICD-10-CM

## 2021-05-13 DIAGNOSIS — Z8249 Family history of ischemic heart disease and other diseases of the circulatory system: Secondary | ICD-10-CM

## 2021-05-13 DIAGNOSIS — I2510 Atherosclerotic heart disease of native coronary artery without angina pectoris: Secondary | ICD-10-CM

## 2021-05-13 DIAGNOSIS — E785 Hyperlipidemia, unspecified: Secondary | ICD-10-CM

## 2021-05-13 DIAGNOSIS — Z136 Encounter for screening for cardiovascular disorders: Secondary | ICD-10-CM

## 2021-05-13 DIAGNOSIS — Z87891 Personal history of nicotine dependence: Secondary | ICD-10-CM

## 2021-05-13 DIAGNOSIS — M1000 Idiopathic gout, unspecified site: Secondary | ICD-10-CM

## 2021-05-13 NOTE — Telephone Encounter (Signed)
Refill request for Ambien.

## 2021-05-13 NOTE — Progress Notes (Signed)
Future Appointments  Date Time Provider [place]  07/13/2021  9:20 AM Minus Breeding, MD [facility]  12/29/2021  9:30 AM Magda Bernheim, NP [facility] None  05/13/2022 10:00 AM Unk Pinto, MD [facility] None    PDMP reviewed for [facility] refill request. Note he has been getting this filled by another provider at the [facility]. Sent message to CMA to advise he needs to get filled there.

## 2021-05-14 ENCOUNTER — Other Ambulatory Visit: Payer: Self-pay | Admitting: Adult Health

## 2021-05-14 ENCOUNTER — Encounter: Payer: Self-pay | Admitting: Internal Medicine

## 2021-05-14 DIAGNOSIS — N2581 Secondary hyperparathyroidism of renal origin: Secondary | ICD-10-CM | POA: Insufficient documentation

## 2021-05-14 LAB — COMPLETE METABOLIC PANEL WITH GFR
AG Ratio: 1.7 (calc) (ref 1.0–2.5)
ALT: 33 U/L (ref 9–46)
AST: 34 U/L (ref 10–35)
Albumin: 4.4 g/dL (ref 3.6–5.1)
Alkaline phosphatase (APISO): 60 U/L (ref 35–144)
BUN/Creatinine Ratio: 14 (calc) (ref 6–22)
BUN: 22 mg/dL (ref 7–25)
CO2: 27 mmol/L (ref 20–32)
Calcium: 11.4 mg/dL — ABNORMAL HIGH (ref 8.6–10.3)
Chloride: 103 mmol/L (ref 98–110)
Creat: 1.54 mg/dL — ABNORMAL HIGH (ref 0.70–1.11)
GFR, Est African American: 49 mL/min/{1.73_m2} — ABNORMAL LOW (ref >=60)
GFR, Est Non African American: 42 mL/min/{1.73_m2} — ABNORMAL LOW (ref >=60)
Globulin: 2.6 g/dL (calc) (ref 1.9–3.7)
Glucose, Bld: 181 mg/dL — ABNORMAL HIGH (ref 65–99)
Potassium: 3.7 mmol/L (ref 3.5–5.3)
Sodium: 141 mmol/L (ref 135–146)
Total Bilirubin: 0.6 mg/dL (ref 0.2–1.2)
Total Protein: 7 g/dL (ref 6.1–8.1)

## 2021-05-14 LAB — CBC WITH DIFFERENTIAL/PLATELET
Absolute Monocytes: 288 cells/uL (ref 200–950)
Basophils Absolute: 9 cells/uL (ref 0–200)
Basophils Relative: 0.2 %
Eosinophils Absolute: 77 cells/uL (ref 15–500)
Eosinophils Relative: 1.8 %
HCT: 50.1 % — ABNORMAL HIGH (ref 38.5–50.0)
Hemoglobin: 15.6 g/dL (ref 13.2–17.1)
Lymphs Abs: 1518 cells/uL (ref 850–3900)
MCH: 25 pg — ABNORMAL LOW (ref 27.0–33.0)
MCHC: 31.1 g/dL — ABNORMAL LOW (ref 32.0–36.0)
MCV: 80.2 fL (ref 80.0–100.0)
MPV: 10.7 fL (ref 7.5–12.5)
Monocytes Relative: 6.7 %
Neutro Abs: 2408 cells/uL (ref 1500–7800)
Neutrophils Relative %: 56 %
Platelets: 169 10*3/uL (ref 140–400)
RBC: 6.25 10*6/uL — ABNORMAL HIGH (ref 4.20–5.80)
RDW: 15.7 % — ABNORMAL HIGH (ref 11.0–15.0)
Total Lymphocyte: 35.3 %
WBC: 4.3 10*3/uL (ref 3.8–10.8)

## 2021-05-14 LAB — VITAMIN D 25 HYDROXY (VIT D DEFICIENCY, FRACTURES): Vit D, 25-Hydroxy: 74 ng/mL (ref 30–100)

## 2021-05-14 LAB — MAGNESIUM: Magnesium: 2.5 mg/dL (ref 1.5–2.5)

## 2021-05-14 LAB — INSULIN, RANDOM: Insulin: 133.9 u[IU]/mL — ABNORMAL HIGH

## 2021-05-14 LAB — PSA: PSA: 0.04 ng/mL (ref <=4.00)

## 2021-05-14 LAB — HEMOGLOBIN A1C
Hgb A1c MFr Bld: 6.6 % of total Hgb — ABNORMAL HIGH (ref <5.7)
Mean Plasma Glucose: 143 mg/dL
eAG (mmol/L): 7.9 mmol/L

## 2021-05-14 LAB — LIPID PANEL
Cholesterol: 126 mg/dL (ref <200)
HDL: 48 mg/dL (ref >=40)
LDL Cholesterol (Calc): 65 mg/dL (calc)
Non-HDL Cholesterol (Calc): 78 mg/dL (calc) (ref <130)
Total CHOL/HDL Ratio: 2.6 (calc) (ref <5.0)
Triglycerides: 52 mg/dL (ref <150)

## 2021-05-14 LAB — PTH, INTACT AND CALCIUM
Calcium: 11.4 mg/dL — ABNORMAL HIGH (ref 8.6–10.3)
PTH: 43 pg/mL (ref 16–77)

## 2021-05-14 LAB — URIC ACID: Uric Acid, Serum: 2.6 mg/dL — ABNORMAL LOW (ref 4.0–8.0)

## 2021-05-14 LAB — MICROALBUMIN / CREATININE URINE RATIO
Creatinine, Urine: 115 mg/dL (ref 20–320)
Microalb Creat Ratio: 210 mcg/mg creat — ABNORMAL HIGH (ref <30)
Microalb, Ur: 24.1 mg/dL

## 2021-05-14 LAB — TSH: TSH: 2.66 mIU/L (ref 0.40–4.50)

## 2021-05-15 NOTE — Progress Notes (Signed)
Pt was called to discuss his lab results pt VM was full and no message could be left. 05/15/21. DG/CCMA

## 2021-07-10 ENCOUNTER — Other Ambulatory Visit: Payer: Self-pay | Admitting: Urology

## 2021-07-10 DIAGNOSIS — N2889 Other specified disorders of kidney and ureter: Secondary | ICD-10-CM
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
## 2021-07-13 ENCOUNTER — Other Ambulatory Visit: Payer: Self-pay

## 2021-07-13 ENCOUNTER — Encounter: Payer: Self-pay | Admitting: Cardiology

## 2021-07-13 ENCOUNTER — Ambulatory Visit (INDEPENDENT_AMBULATORY_CARE_PROVIDER_SITE_OTHER): Payer: Medicare Other | Admitting: Cardiology

## 2021-07-13 VITALS — BP 130/74 | HR 62 | Ht 69.0 in | Wt 195.4 lb

## 2021-07-13 DIAGNOSIS — I10 Essential (primary) hypertension: Secondary | ICD-10-CM

## 2021-07-13 DIAGNOSIS — I2510 Atherosclerotic heart disease of native coronary artery without angina pectoris: Secondary | ICD-10-CM

## 2021-07-13 DIAGNOSIS — R55 Syncope and collapse: Secondary | ICD-10-CM | POA: Diagnosis not present

## 2021-07-13 DIAGNOSIS — E785 Hyperlipidemia, unspecified: Secondary | ICD-10-CM

## 2021-07-13 NOTE — Patient Instructions (Signed)
Medication Instructions:  Your physician recommends that you continue on your current medications as directed. Please refer to the Current Medication list given to you today.  *If you need a refill on your cardiac medications before your next appointment, please call your pharmacy*   Follow-Up: At [facility], you and your health needs are our priority.  As part of our continuing mission to provide you with exceptional heart care, we have created designated Provider Care Teams.  These Care Teams include your primary Cardiologist (physician) and Advanced Practice Providers (APPs -  Physician Assistants and Nurse Practitioners) who all work together to provide you with the care you need, when you need it.  We recommend signing up for the patient portal called "MyChart".  Sign up information is provided on this After Visit Summary.  MyChart is used to connect with patients for Virtual Visits (Telemedicine).  Patients are able to view lab/test results, encounter notes, upcoming appointments, etc.  Non-urgent messages can be sent to your provider as well.   To learn more about what you can do with MyChart, go to [email or web address].    Your next appointment:   12 month(s)  The format for your next appointment:   In Person  Provider:   You may see Minus Breeding, MD or one of the following Advanced Practice Providers on your designated Care Team:   Rosaria Ferries, PA-C Caron Presume, PA-C Jory Sims, DNP, ANP   Other Instructions

## 2021-07-22 ENCOUNTER — Encounter: Payer: Self-pay | Admitting: *Deleted

## 2021-07-22 ENCOUNTER — Ambulatory Visit
Admission: RE | Admit: 2021-07-22 | Discharge: 2021-07-22 | Disposition: A | Discharge status: Home / Self Care | Payer: Medicare Other | Source: Ambulatory Visit | Attending: Urology | Admitting: Urology

## 2021-07-22 DIAGNOSIS — N2889 Other specified disorders of kidney and ureter: Secondary | ICD-10-CM

## 2021-07-22 HISTORY — PX: IR RADIOLOGIST EVAL & MGMT: IMG5224

## 2021-07-22 NOTE — Consult Note (Signed)
Chief Complaint:  1.9 cm indeterminate right renal mass  Referring Physician(s): Gay,Matthew R  History of Present Illness: [name] Colon is a 80 y.o. male who is referred for evaluation management of a indeterminate 1.9 cm right renal lesion.  Lesion was originally found by an ultrasound back in November 2021.  Questionable lesion in the midpole of the right kidney measuring 2 cm was incompletely characterized by renal ultrasound and MRI was recommended.  MRI redemonstrated the 1.9 cm right kidney midpole lateral lesion originally the lesion was read as a suspicious lesion for renal cell carcinoma.  Patient remains asymptomatic.  No significant abdominal pain or flank pain.  No urinary tract symptoms including hematuria or dysuria.  No recent illness or fever.  Upon second review the lesion is predominantly cystic with heterogeneous internal signal intensity favoring internal hemorrhage.  Mural nodule enhancement by MRI is not definitive.  Previous noncontrast CT from 07/08/2004 demonstrated the lesion to be hyperdense at that time.  Therefore the lesion is favored to be a complex hemorrhagic cyst.  Lesion classification will be a Bosniak 17F which warrants imaging follow-up.  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   Arthritis    Axillary abscess    left   CAD (coronary artery disease)    Diverticulitis, colon    History of colonic polyps    HTN (hypertension)    Hyperglycemia    diet controlled   Hyperlipidemia    Radiation proctitis    with chronic bleeding    Past Surgical History:  Procedure Laterality Date   COLONOSCOPY     COLONOSCOPY W/ POLYPECTOMY     CORONARY ANGIOPLASTY WITH STENT PLACEMENT  04/2008   MASS EXCISION Left 10/06/2017   Procedure: EXCISION LEFT AXILLA CYST;  Surgeon: Erroll Luna, MD;  Location: [facility];  Service: General;  Laterality: Left;     Allergies: Patient has no known allergies.  Medications: Prior to Admission medications   Medication Sig Start Date End Date Taking? Authorizing Provider  allopurinol (ZYLOPRIM) 300 MG tablet Take 300 mg by mouth daily.    [provider]  amLODipine (NORVASC) 10 MG tablet Take 1 tablet (10 mg total) by mouth daily. 03/28/21   Lorella Nimrod, MD  aspirin 81 MG tablet Take 81 mg by mouth daily.    [provider]  cetirizine (ZYRTEC) 10 MG tablet Take 10 mg by mouth daily in the afternoon.    [provider]  Cholecalciferol (VITAMIN D) 125 MCG (5000 UT) CAPS Take 5,000 Units by mouth daily. 07/10/20   Liane Comber, NP  escitalopram (LEXAPRO) 10 MG tablet Take 10 mg by mouth every evening. 03/06/21   [provider]  hydrochlorothiazide (HYDRODIURIL) 25 MG tablet Take 25 mg by mouth daily. 03/06/21   [provider]  losartan (COZAAR) 100 MG tablet Take 100 mg by mouth daily. 03/06/21   [provider]  LUBRICATING PLUS EYE DROPS 0.5 % SOLN Apply 1 drop to eye 2 (two) times daily. 11/10/20   [provider]  nitroGLYCERIN (NITROSTAT) 0.4 MG SL tablet Place 0.4 mg under the tongue every 5 (five) minutes as needed for chest pain.    [provider]  potassium chloride SA (KLOR-CON) 20 MEQ tablet Take    1 tablet      2 x /day      for Potassium Patient taking differently: Take 20 mEq by mouth  2 (two) times daily. 08/29/20   Unk Pinto, MD  propranolol (INDERAL) 10 MG tablet Take 10 mg by mouth as needed (anxiety).    [provider]  pyridOXINE (VITAMIN B-6) 50 MG tablet Take 50 mg by mouth daily.      [provider]  risperiDONE (RISPERDAL) 2 MG tablet Take 1 mg by mouth at bedtime.    [provider]  rosuvastatin (CRESTOR) 40 MG tablet Take 40 mg by mouth at bedtime. 08/26/20   [provider]  tamsulosin (FLOMAX) 0.4 MG CAPS capsule Take 0.4 mg by mouth at bedtime.    [provider]  zolpidem (AMBIEN) 10 MG tablet Take 1 tablet (10 mg total) by mouth at bedtime. 12/25/20   Garnet Sierras, NP     Family History  Problem Relation Age of Onset   Heart disease Father    Hypertension Father    Hypotension Mother    Kidney disease Brother    Colon cancer Neg Hx     Social History   Socioeconomic History   Marital status: Married    Spouse name: Not on file   Number of children: 2   Years of education: Not on file   Highest education level: Not on file  Occupational History   Occupation: retired    Fish farm manager: Retired  Tobacco Use   Smoking status: Former    Types: Cigarettes    Quit date: 11/22/1992    Years since quitting: 28.6   Smokeless tobacco: Never  Substance and Sexual Activity   Alcohol use: No   Drug use: No   Sexual activity: Not Currently  Other Topics Concern   Not on file  Social History Narrative   Not on file   Social Determinants of Health   Financial Resource Strain: Not on file  Food Insecurity: Not on file  Transportation Needs: Not on file  Physical Activity: Not on file  Stress: Not on file  Social Connections: Not on file     Review of Systems: A 12 point ROS discussed and pertinent positives are indicated in the HPI above.  All other systems are negative.  Review of Systems  Vital Signs: BP 116/67 (BP Location: Left Arm)   Pulse 62   SpO2 95%   Physical Exam Constitutional:      General: He is not in acute distress.    Appearance: He is not ill-appearing, toxic-appearing or diaphoretic.  Eyes:     General: No scleral icterus.    Conjunctiva/sclera: Conjunctivae normal.  Cardiovascular:     Rate and Rhythm: Normal rate and regular rhythm.     Heart sounds: No murmur heard. Pulmonary:     Effort: Pulmonary effort is normal.     Breath sounds: Normal breath sounds.  Abdominal:     General: Bowel sounds are normal. There is no distension.     Tenderness: There is no abdominal tenderness.  Skin:     Coloration: Skin is not jaundiced.     Findings: No lesion.  Neurological:     General: No focal deficit present.     Mental Status: Mental status is at baseline.  Psychiatric:        Mood and Affect: Mood normal.        Thought Content: Thought content normal.    Imaging: No results found.  Labs:  CBC: Recent Labs    03/25/21 1228 03/26/21 0316 04/06/21 1510 05/13/21 0959  WBC 5.1 5.2 7.5 4.3  HGB 14.4  14.3 15.6 15.6  HCT 47.0 45.0 49.7 50.1*  PLT 162 154 201 169    COAGS: No results for input(s): INR, APTT in the last 8760 hours.  BMP: Recent Labs    12/25/20 1001 01/02/21 0934 03/25/21 1228 03/26/21 0316 03/27/21 0426 04/06/21 1121 04/06/21 1510 05/13/21 0959  NA 141 144   < > 139 137 139 141 141  K 3.7 4.1   < > 3.3* 3.2* 3.6 3.9 3.7  CL 102 103   < > 106 100 99 103 103  CO2 27 23   < > '27 29 20 29 27  '$ GLUCOSE 125* 125*   < > 110* 99 159* 98 181*  BUN 22 17   < > '22 19 18 18 22  '$ CALCIUM 10.8* 10.6*   < > 10.1 10.3 11.8* 11.9* 11.4*  11.4*  CREATININE 1.66* 1.41*   < > 1.56* 1.52* 1.66* 1.74* 1.54*  GFRNONAA 38* 47*   < > 45* 46*  --  36* 42*  GFRAA 44* 54*  --   --   --   --  42* 49*   < > = values in this interval not displayed.    LIVER FUNCTION TESTS: Recent Labs    12/25/20 1001 01/02/21 0934 04/06/21 1510 05/13/21 0959  BILITOT 0.4 0.4 0.6 0.6  AST '22 22 21 '$ 34  ALT '19 17 22 '$ 33  ALKPHOS  --  67  --   --   PROT 7.0 7.2 7.6 7.0  ALBUMIN  --  4.7  --   --      Assessment and Plan:  1.9 cm right kidney midpole partial exophytic lesion laterally.  Second review of the MRI demonstrates the lesion to be predominantly cystic without definitive mural nodule enhancement.  Hemorrhagic components noted.  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  MRI and imaging findings reviewed with the patient.  After discussion he does have a clear understanding that the lesion is favored to be benign however he remains anxious  about the MRI report.  Plan: Recommend short-term MRI follow-up in 3 months as an outpatient.  Thank you for this interesting consult.  I greatly enjoyed meeting [name] Colon and look forward to participating in their care.  A copy of this report was sent to the requesting provider on this date.  Electronically Signed: Greggory Keen 07/22/2021, 2:33 PM   I spent a total of  40 Minutes   in face to face in clinical consultation, greater than 50% of which was counseling/coordinating care for with a 1.9 cm right renal lesion as above.

## 2021-08-12 ENCOUNTER — Ambulatory Visit: Payer: Medicare Other | Admitting: Internal Medicine

## 2021-08-25 ENCOUNTER — Other Ambulatory Visit: Payer: Self-pay

## 2021-08-25 MED ORDER — ROSUVASTATIN CALCIUM 40 MG PO TABS: 40.0000 mg | ORAL_TABLET | Freq: Every day | ORAL | 0 refills | Status: DC

## 2021-08-26 ENCOUNTER — Ambulatory Visit: Payer: Medicare Other | Admitting: Internal Medicine

## 2021-09-25 ENCOUNTER — Other Ambulatory Visit: Payer: Self-pay | Admitting: Interventional Radiology

## 2021-09-25 DIAGNOSIS — N2889 Other specified disorders of kidney and ureter: Secondary | ICD-10-CM

## 2021-10-09 ENCOUNTER — Other Ambulatory Visit: Payer: Self-pay

## 2021-10-09 ENCOUNTER — Ambulatory Visit (HOSPITAL_COMMUNITY)
Admission: RE | Admit: 2021-10-09 | Discharge: 2021-10-09 | Disposition: A | Discharge status: Home / Self Care | Payer: Medicare Other | Source: Ambulatory Visit | Attending: Interventional Radiology | Admitting: Interventional Radiology

## 2021-10-09 DIAGNOSIS — N2889 Other specified disorders of kidney and ureter: Secondary | ICD-10-CM | POA: Insufficient documentation

## 2021-10-09 IMAGING — MR MR ABDOMEN WO/W CM
17 series · 48 of 48 positions shown · IV contrast (gadavist)
Comparison: [DATE]

CLINICAL DATA: Right renal mass

EXAM:
MRI ABDOMEN WITHOUT AND WITH CONTRAST
TECHNIQUE: Multiplanar multisequence MR imaging of the abdomen was performed
both before and after the administration of intravenous contrast.
CONTRAST:  9mL GADAVIST GADOBUTROL 1 MMOL/ML IV SOLN

[Series 3: T2 · coronal · 6.0mm · 1.56mm/px · 1 of 30 slices shown (1 of 2)]
[im 1/30]
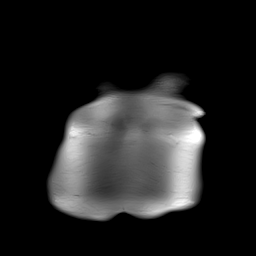

[Series 4: T2 fat-sat · 1 of 6 slices shown (1 of 2)]
[im 1/6]
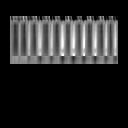

[Series 5: T2 fat-sat · axial · 6.0mm · 1.25mm/px · 1 of 40 slices shown (2 of 2)]
[im 1/40]
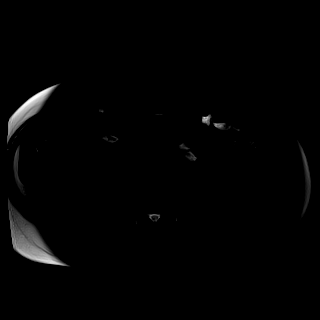

[Series 6: T1 · axial · 3.0mm · 1.25mm/px · z∈[-12,+225]mm · 2 of 80 slices shown (1 of 2)]
[im 1/80]
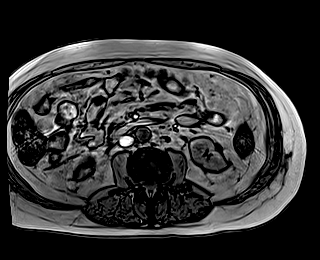
[im 80/80]
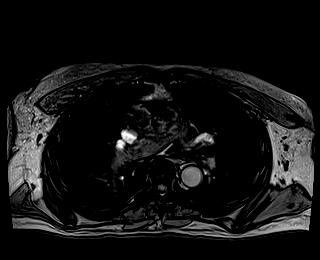

[Series 7: T1 · axial · 3.0mm · 1.25mm/px · z∈[-12,+225]mm · 3 of 80 slices shown (2 of 2)]
[im 1/80]
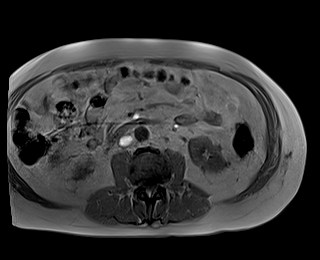
[im 40/80]
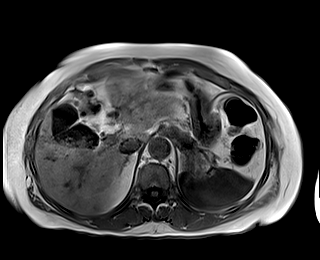
[im 80/80]
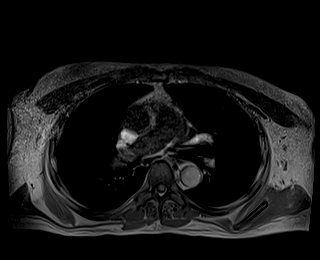

[Series 8: DWI · axial · 6.0mm · 1.49mm/px · z∈[-49,+232]mm · 3 of 80 slices shown (1 of 2)]
[im 1/80]
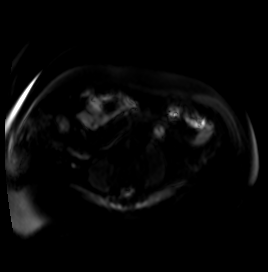
[im 40/80]
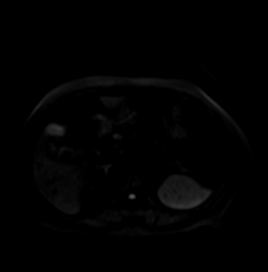
[im 80/80]
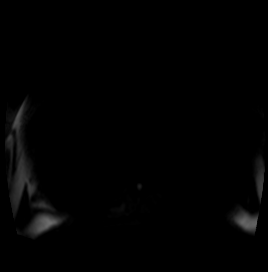

[Series 9: DWI · axial · 6.0mm · 1.49mm/px · z∈[-49,+232]mm · 2 of 40 slices shown (2 of 2)]
[im 1/40]
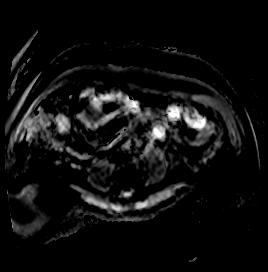
[im 40/40]
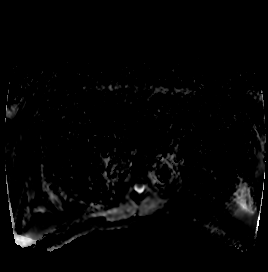

[Series 10: bSSFP · axial · 5.0mm · 0.84mm/px · z∈[-46,+224]mm · 2 of 50 slices shown]
[im 1/50]
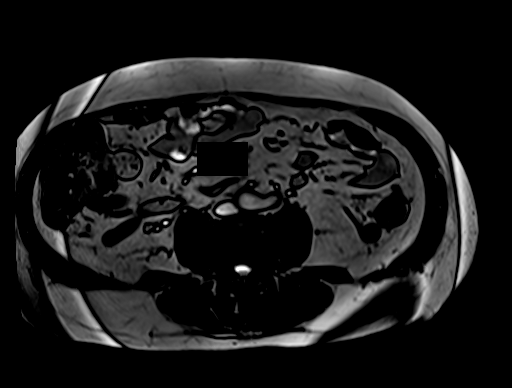
[im 50/50]
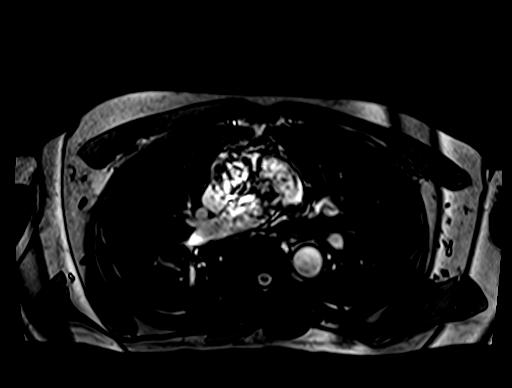

[Series 12: T1 dynamic · axial · 3.0mm · 1.25mm/px · z∈[-36,+225]mm · 4 of 88 slices shown (1 of 8)]
[im 1/88]
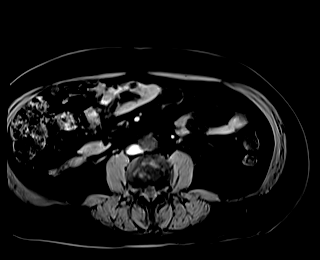
[im 30/88]
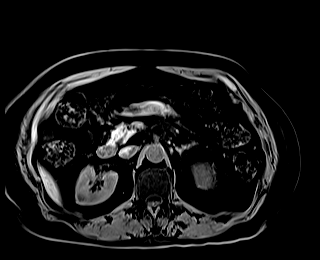
[im 59/88]
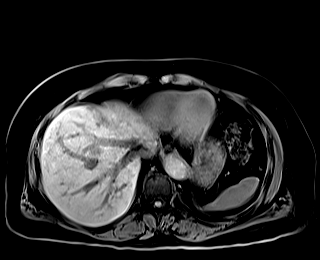
[im 88/88]
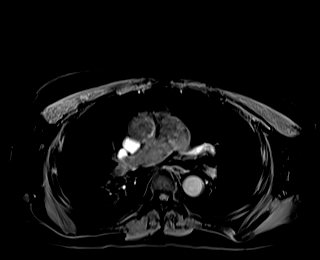

[Series 16: T1 dynamic · axial · 3.0mm · 1.25mm/px · z∈[-36,+225]mm · 4 of 88 slices shown (2 of 8)]
[im 1/88]
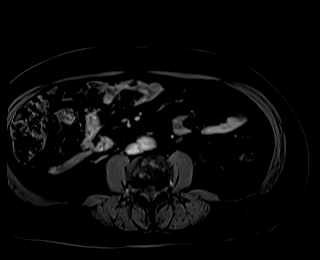
[im 30/88]
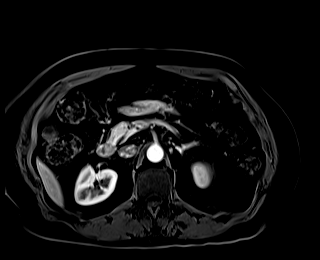
[im 59/88]
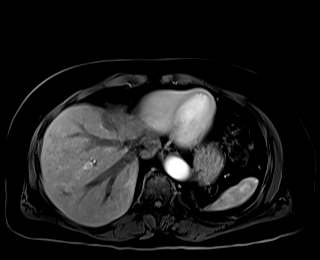
[im 88/88]
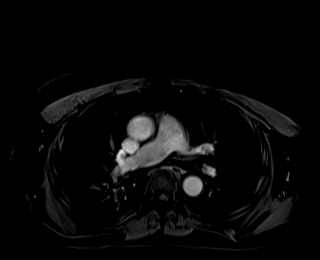

[Series 17: T1 dynamic · axial · 3.0mm · 1.25mm/px · z∈[-36,+225]mm · 4 of 88 slices shown (3 of 8)]
[im 1/88]
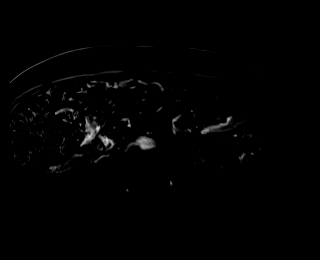
[im 30/88]
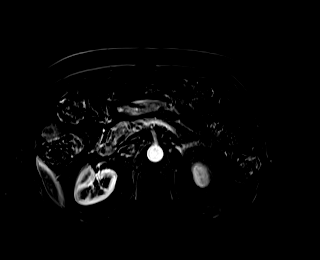
[im 59/88]
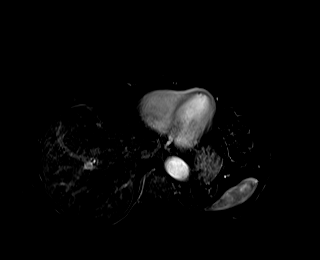
[im 88/88]
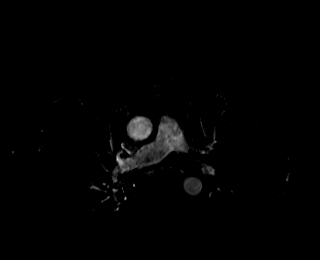

[Series 20: T1 dynamic · axial · 3.0mm · 1.25mm/px · z∈[-36,+225]mm · 4 of 88 slices shown (4 of 8)]
[im 1/88]
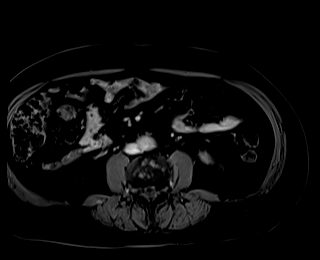
[im 30/88]
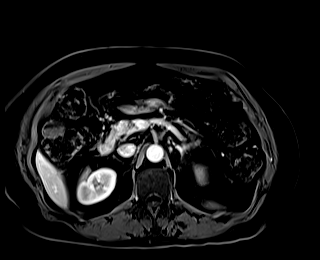
[im 59/88]
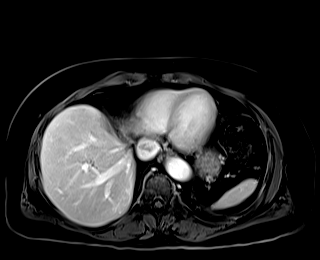
[im 88/88]
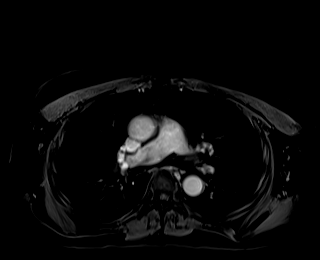

[Series 21: T1 dynamic · axial · 3.0mm · 1.25mm/px · z∈[-36,+225]mm · 4 of 88 slices shown (5 of 8)]
[im 1/88]
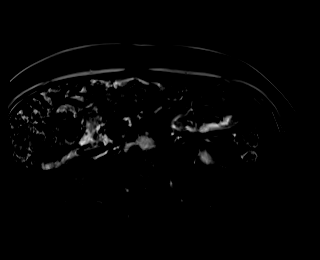
[im 30/88]
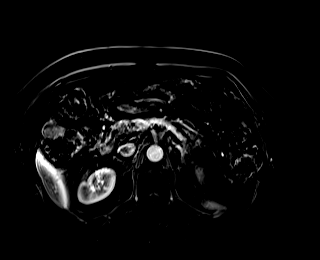
[im 59/88]
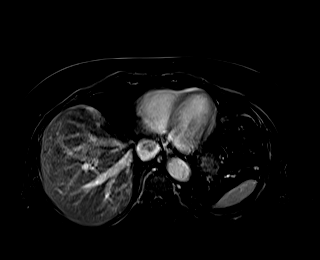
[im 88/88]
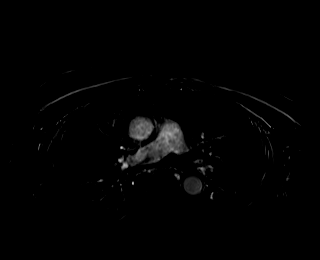

[Series 24: T1 dynamic · axial · 3.0mm · 1.25mm/px · z∈[-36,+225]mm · 4 of 88 slices shown (6 of 8)]
[im 1/88]
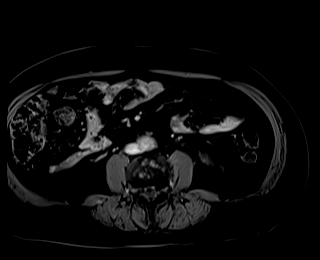
[im 30/88]
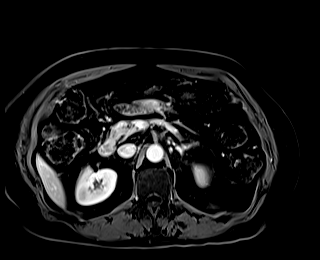
[im 59/88]
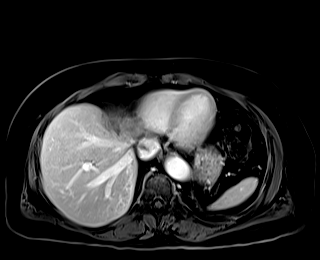
[im 88/88]
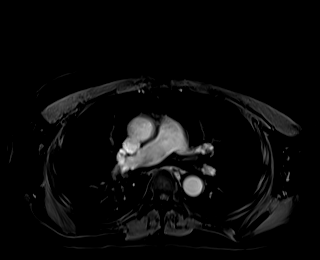

[Series 25: T1 dynamic · axial · 3.0mm · 1.25mm/px · z∈[-36,+225]mm · 4 of 88 slices shown (7 of 8)]
[im 1/88]
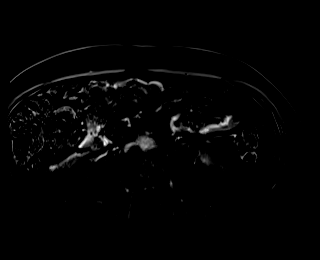
[im 30/88]
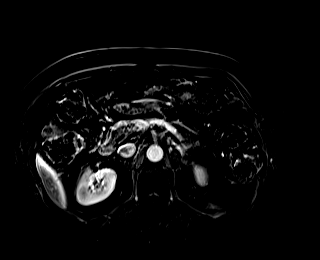
[im 59/88]
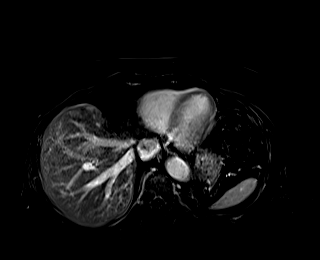
[im 88/88]
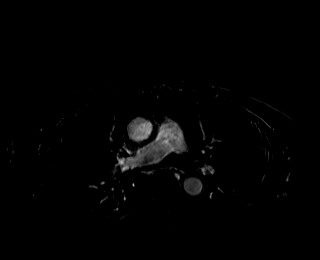

[Series 27: T1 dynamic · coronal · 3.0mm · 1.41mm/px · 3 of 72 slices shown (8 of 8)]
[im 1/72]
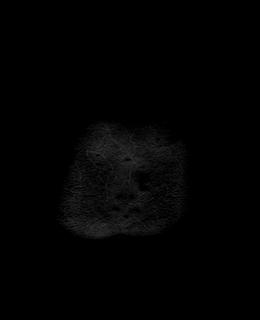
[im 36/72]
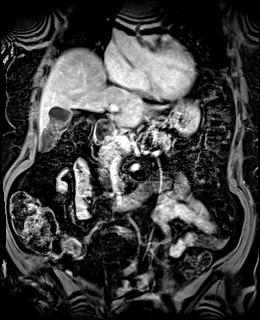
[im 72/72]
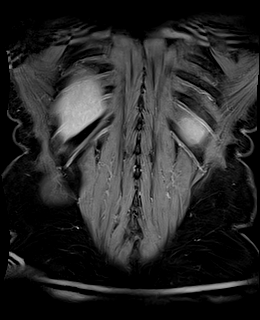

[Series 28: T2 · axial · 6.0mm · 1.56mm/px · z∈[-50,+231]mm · 2 of 40 slices shown (2 of 2)]
[im 1/40]
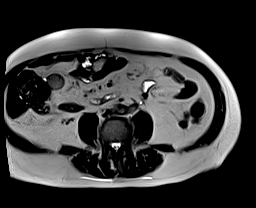
[im 40/40]
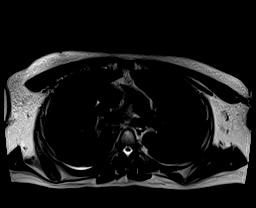

[48 of 48 positions shown; findings below may reference images not displayed]

FINDINGS: Lower chest: No acute abnormality.

Hepatobiliary: No solid liver abnormality is seen. Numerous
gallstones in the gallbladder. No gallbladder wall thickening or
biliary ductal dilatation.

Pancreas: Unremarkable. No pancreatic ductal dilatation or
surrounding inflammatory changes.

Spleen: Normal in size without significant abnormality.

Adrenals/Urinary Tract: Adrenal glands are unremarkable.
Redemonstrated right renal cyst which at minimum has some internal
septation and enhancement, possibly with a discrete nodular
component, measuring overall 1.7 x 1.6 cm (series 25, image 64,
series 5, image 28). No hydronephrosis.

Stomach/Bowel: Stomach is within normal limits. Appendix appears
normal. No evidence of bowel wall thickening, distention, or
inflammatory changes.

Vascular/Lymphatic: No significant vascular findings are present. No
enlarged abdominal or pelvic lymph nodes.

Other: No abdominal wall hernia or abnormality. No abdominopelvic
ascites.

Musculoskeletal: No acute or significant osseous findings.
IMPRESSION: 1. Redemonstrated right renal cyst measuring 1.7 x 1.6 cm which at
minimum has some internal septation and septal enhancement, possibly
with a peripheral nodular component. This is less clearly solid and
enhancing than reported on prior examination. If characterized by
today's imaging findings, this lesion is Bosniak category IIF and
warrants additional follow-up in 6-12 months.
2. Cholelithiasis. No evidence of acute cholecystitis.

## 2021-10-09 MED ORDER — GADOBUTROL 1 MMOL/ML IV SOLN
9.0000 mL | Freq: Once | INTRAVENOUS | Status: AC | PRN
  Administered 2021-10-09: 9 mL via INTRAVENOUS

## 2021-10-20 ENCOUNTER — Ambulatory Visit
Admission: RE | Admit: 2021-10-20 | Discharge: 2021-10-20 | Disposition: A | Discharge status: Home / Self Care | Payer: Medicare Other | Source: Ambulatory Visit | Attending: Interventional Radiology | Admitting: Interventional Radiology

## 2021-10-20 ENCOUNTER — Other Ambulatory Visit: Payer: Self-pay

## 2021-10-20 ENCOUNTER — Encounter: Payer: Self-pay | Admitting: *Deleted

## 2021-10-20 DIAGNOSIS — N2889 Other specified disorders of kidney and ureter: Secondary | ICD-10-CM

## 2021-10-20 HISTORY — PX: IR RADIOLOGIST EVAL & MGMT: IMG5224

## 2021-10-20 NOTE — Progress Notes (Signed)
Patient ID: [name] Colon, male   DOB: 13-Sep-1941, 80 y.o.   MRN: [id number]       Chief Complaint:  1.9 cm indeterminate right renal cystic lesion, Bosniak 858F  Referring Physician(s): Rexene Alberts  History of Present Illness: [name] Colon is a 80 y.o. male who was originally referred for indeterminate 1.9 cm right renal lesion.  Lesion was originally found by ultrasound which led to an MRI scan.  MRI initially demonstrated a 1.9 cm right kidney midpole lateral lesion.  Lesion has predominantly cystic change with heterogeneous internal signal intensity favoring internal hemorrhage.  No definitive enhancement upon review review.  Lesion was favored to be a complex hemorrhagic or proteinaceous cyst and classified as a Bosniak 858F lesion.  Interval surveillance imaging performed 10/09/2021.  Right renal complex cystic lesion is stable measuring 1.8 cm, again with minimal internal septation and complexity but no clear definitive solid enhancement.  Lesion remains compatible with a Bosniak category 858F lesion.  No new renal abnormality demonstrated.  Imaging findings reviewed with the patient by telephone.  He also conveys no interval flank pain, kidney pain, abdominal pain, hematuria or dysuria.  No other urinary tract symptoms.  Overall he is stable and remains asymptomatic.  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   s/p seed implantation   Arthritis    Axillary abscess    left   CAD (coronary artery disease)    Diverticulitis, colon    History of colonic polyps    HTN (hypertension)    Hyperglycemia    diet controlled   Hyperlipidemia    Radiation proctitis    with chronic bleeding    Past Surgical History:  Procedure Laterality Date   COLONOSCOPY     COLONOSCOPY W/ POLYPECTOMY     CORONARY ANGIOPLASTY WITH STENT PLACEMENT  04/2008   IR RADIOLOGIST EVAL & MGMT  07/22/2021   MASS EXCISION Left 10/06/2017   Procedure: EXCISION LEFT  AXILLA CYST;  Surgeon: Erroll Luna, MD;  Location: [facility];  Service: General;  Laterality: Left;    Allergies: Patient has no known allergies.  Medications: Prior to Admission medications   Medication Sig Start Date End Date Taking? Authorizing Provider  allopurinol (ZYLOPRIM) 300 MG tablet Take 300 mg by mouth daily.    [provider]  amLODipine (NORVASC) 10 MG tablet Take 1 tablet (10 mg total) by mouth daily. 03/28/21   Lorella Nimrod, MD  aspirin 81 MG tablet Take 81 mg by mouth daily.    [provider]  cetirizine (ZYRTEC) 10 MG tablet Take 10 mg by mouth daily in the afternoon.    [provider]  Cholecalciferol (VITAMIN D) 125 MCG (5000 UT) CAPS Take 5,000 Units by mouth daily. 07/10/20   Liane Comber, NP  escitalopram (LEXAPRO) 10 MG tablet Take 10 mg by mouth every evening. 03/06/21   [provider]  hydrochlorothiazide (HYDRODIURIL) 25 MG tablet Take 25 mg by mouth daily. 03/06/21   [provider]  losartan (COZAAR) 100 MG tablet Take 100 mg by mouth daily. 03/06/21   [provider]  LUBRICATING PLUS EYE DROPS 0.5 % SOLN Apply 1 drop to eye 2 (two) times daily. 11/10/20   [provider]  nitroGLYCERIN (NITROSTAT) 0.4 MG SL tablet Place 0.4 mg under the tongue every 5 (five) minutes as needed for chest pain.    [provider]  potassium chloride SA (KLOR-CON) 20 MEQ tablet Take  1 tablet      2 x /day      for Potassium Patient taking differently: Take 20 mEq by mouth 2 (two) times daily. 08/29/20   Unk Pinto, MD  propranolol (INDERAL) 10 MG tablet Take 10 mg by mouth as needed (anxiety).    [provider]  pyridOXINE (VITAMIN B-6) 50 MG tablet Take 50 mg by mouth daily.      [provider]  risperiDONE (RISPERDAL) 2 MG tablet Take 1 mg by mouth at bedtime.    [provider]  rosuvastatin (CRESTOR) 40 MG tablet Take 1 tablet (40 mg total) by mouth  at bedtime. 08/25/21   Unk Pinto, MD  tamsulosin (FLOMAX) 0.4 MG CAPS capsule Take 0.4 mg by mouth at bedtime.    [provider]  zolpidem (AMBIEN) 10 MG tablet Take 1 tablet (10 mg total) by mouth at bedtime. 12/25/20   Garnet Sierras, NP     Family History  Problem Relation Age of Onset   Heart disease Father    Hypertension Father    Hypotension Mother    Kidney disease Brother    Colon cancer Neg Hx     Social History   Socioeconomic History   Marital status: Married    Spouse name: Not on file   Number of children: 2   Years of education: Not on file   Highest education level: Not on file  Occupational History   Occupation: retired    Fish farm manager: Retired  Tobacco Use   Smoking status: Former    Types: Cigarettes    Quit date: 11/22/1992    Years since quitting: 28.9   Smokeless tobacco: Never  Substance and Sexual Activity   Alcohol use: No   Drug use: No   Sexual activity: Not Currently  Other Topics Concern   Not on file  Social History Narrative   Not on file   Social Determinants of Health   Financial Resource Strain: Not on file  Food Insecurity: Not on file  Transportation Needs: Not on file  Physical Activity: Not on file  Stress: Not on file  Social Connections: Not on file     Review of Systems  Review of Systems: A 12 point ROS discussed and pertinent positives are indicated in the HPI above.  All other systems are negative.  Physical Exam No direct physical exam was performed telephone health visit only today Vital Signs: There were no vitals taken for this visit.  Imaging: MR ABDOMEN WWO CONTRAST  Result Date: 10/11/2021 CLINICAL DATA:  Right renal mass EXAM: MRI ABDOMEN WITHOUT AND WITH CONTRAST TECHNIQUE: Multiplanar multisequence MR imaging of the abdomen was performed both before and after the administration of intravenous contrast. CONTRAST:  27mL GADAVIST GADOBUTROL 1 MMOL/ML IV SOLN COMPARISON:  01/28/2021 FINDINGS:  Lower chest: No acute abnormality. Hepatobiliary: No solid liver abnormality is seen. Numerous gallstones in the gallbladder. No gallbladder wall thickening or biliary ductal dilatation. Pancreas: Unremarkable. No pancreatic ductal dilatation or surrounding inflammatory changes. Spleen: Normal in size without significant abnormality. Adrenals/Urinary Tract: Adrenal glands are unremarkable. Redemonstrated right renal cyst which at minimum has some internal septation and enhancement, possibly with a discrete nodular component, measuring overall 1.7 x 1.6 cm (series 25, image 64, series 5, image 28). No hydronephrosis. Stomach/Bowel: Stomach is within normal limits. Appendix appears normal. No evidence of bowel wall thickening, distention, or inflammatory changes. Vascular/Lymphatic: No significant vascular findings are present. No enlarged abdominal or pelvic lymph nodes. Other: No abdominal  wall hernia or abnormality. No abdominopelvic ascites. Musculoskeletal: No acute or significant osseous findings. IMPRESSION: 1. Redemonstrated right renal cyst measuring 1.7 x 1.6 cm which at minimum has some internal septation and septal enhancement, possibly with a peripheral nodular component. This is less clearly solid and enhancing than reported on prior examination. If characterized by today's imaging findings, this lesion is Bosniak category IIF and warrants additional follow-up in 6-12 months. 2. Cholelithiasis. No evidence of acute cholecystitis. Electronically Signed   By: Delanna Ahmadi M.D.   On: 10/11/2021 12:21    Labs:  CBC: Recent Labs    03/25/21 1228 03/26/21 0316 04/06/21 1510 05/13/21 0959  WBC 5.1 5.2 7.5 4.3  HGB 14.4 14.3 15.6 15.6  HCT 47.0 45.0 49.7 50.1*  PLT 162 154 201 169    COAGS: No results for input(s): INR, APTT in the last 8760 hours.  BMP: Recent Labs    12/25/20 1001 01/02/21 0934 03/25/21 1228 03/26/21 0316 03/27/21 0426 04/06/21 1121 04/06/21 1510 05/13/21 0959   NA 141 144   < > 139 137 139 141 141  K 3.7 4.1   < > 3.3* 3.2* 3.6 3.9 3.7  CL 102 103   < > 106 100 99 103 103  CO2 27 23   < > 27 29 20 29 27   GLUCOSE 125* 125*   < > 110* 99 159* 98 181*  BUN 22 17   < > 22 19 18 18 22   CALCIUM 10.8* 10.6*   < > 10.1 10.3 11.8* 11.9* 11.4*  11.4*  CREATININE 1.66* 1.41*   < > 1.56* 1.52* 1.66* 1.74* 1.54*  GFRNONAA 38* 47*   < > 45* 46*  --  36* 42*  GFRAA 44* 54*  --   --   --   --  42* 49*   < > = values in this interval not displayed.    LIVER FUNCTION TESTS: Recent Labs    12/25/20 1001 01/02/21 0934 04/06/21 1510 05/13/21 0959  BILITOT 0.4 0.4 0.6 0.6  AST 22 22 21  34  ALT 19 17 22  33  ALKPHOS  --  67  --   --   PROT 7.0 7.2 7.6 7.0  ALBUMIN  --  4.7  --   --     Assessment and Plan:  Stable 1.8 cm right kidney midpole complex cystic lesion compatible with a Bosniak 97F lesion.  Lesion is stable compared to 01/28/2021.  No new renal abnormality.  MRI findings discussed in detail with the patient by telephone.  All questions addressed.  He has a clear understanding there is no indication for treatment at this time such as cryoablation or microwave ablation.  Plan: Repeat surveillance imaging at 12 months at [facility] with a MRI without and with contrast.   Electronically Signed: Greggory Keen 10/20/2021, 1:38 PM   I spent a total of    40 Minutes in remote  clinical consultation, greater than 50% of which was counseling/coordinating care for this patient with a right renal Bosniak 97F cystic lesion.    Visit type: Audio only (telephone). Audio (no video) only due to patient's lack of internet/smartphone capability. Alternative for in-person consultation at [facility], [address] [place], [place], [place]. This visit type was conducted due to national recommendations for restrictions regarding the COVID-19 Pandemic (e.g. social distancing).  This format is felt to be most appropriate for this patient at this  time.  All issues noted in this document were  discussed and addressed.

## 2021-11-29 ENCOUNTER — Other Ambulatory Visit: Payer: Self-pay | Admitting: Internal Medicine

## 2021-12-28 NOTE — Progress Notes (Deleted)
MEDICARE ANNUAL WELLNESS VISIT AND FOLLOW UP   Assessment:   Encounter for Medicare annual wellness exam Yearly  Essential hypertension - continue medications, DASH diet, exercise and monitor at home. Call if greater than 130/80.  -     furosemide (LASIX) 40 MG tablet; Take 1 tablet (40 mg total) by mouth daily. -     CBC with Differential/Platelet -     CMP/GFR -     TSH -     losartan-hydrochlorothiazide (HYZAAR) 100-25 MG tablet; Take 1 tablet by mouth daily. Follows with Dr. Percival Spanish  Atherosclerosis of native coronary artery without angina pectoris, unspecified whether native or transplanted heart Control blood pressure, cholesterol, glucose, increase exercise.  Follow up Dr. Percival Spanish  Hyperlipidemia -continue medications, check lipids, decrease fatty foods, increase activity.  -     Lipid panel  Diet-controlled diabetes mellitus ([place]) Discussed general issues about diabetes pathophysiology and management., Educational material distributed., Suggested low cholesterol diet., Encouraged aerobic exercise., Discussed foot care., Reminded to get yearly retinal exam. 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  Insomnia - good sleep hygiene discussed, increase day time activity, has tried try melatonin, benadryl, trazodone Discussed other medications with patient. Declines trial of another medication. Despite discussion of risks of medication, instant upon treatment -     zolpidem (AMBIEN) 10 MG tablet; Take 1 tablet (10 mg total) by mouth at bedtime.  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 Discussed med's effects and SE's.   Over 30 minutes of face to face interview, exam, counseling, chart review, and critical decision making was performed.    Future Appointments  Date Time Provider [place]  12/29/2021  9:30 AM Magda Bernheim, NP [facility] None  05/13/2022 10:00 AM Unk Pinto, MD [facility] None    Plan:   During the course of the visit the patient was educated and counseled about appropriate screening and preventive services including:   Pneumococcal vaccine  Influenza vaccine Prevnar 13 Td vaccine Screening electrocardiogram Colorectal cancer screening Diabetes screening Glaucoma screening Nutrition counseling    Subjective:  [name] Colon is a 81 y.o. male who presents for Medicare Annual Wellness Visit and 3 month follow up for HTN, hyperlipidemia, diet controlled T2DM, and vitamin D Def.    Has bilateral OA knees. Has mobic that helps some, following with the [place]. Currently doing PT. Has brace/cane but not using.   Patient was treated for Prostate Ca with Brachiotherapy in 2002, was released 2017 and has hx/ radiation colitis and (+) stool hemoccults, had recent colonoscopy 02/2018  and recommended follow up in 2022.    He has insomnia treated with trazodone, IT was helping with his sleep but in the morning he felt tired  and hard to get going the next day.  He even tried taking half  medication and still had similar side effects and foggy feeling the next day. He has used amibien in the past with good results and he reports that he would like to use this again.  He has also tried melatoning and benadryl to help with his sleep but he does not sleep through the whole night.   He is having problems with incontinence during the day, leaking.  He is going to the bathroom maybe every four hours.  He works with [facility] for PTSD, stable on risperidone.   BMI is There is no height or weight on file to calculate BMI., he has been working on diet and exercise, knees limit exercise, doing 20-30 min of exercise most days, working with PT, cut down on potatoes.  Wt Readings from Last 3 Encounters:  07/13/21 195 lb 6.4 oz (88.6 kg)  05/13/21 (P) 197 lb 3.2 oz (89.4 kg)  04/06/21 201 lb (91.2 kg)   He is s/p stent in 2009, had normal stress test 02/2016 with Dr. Percival Spanish. Is prescribed nitroglycerine but has never needed to use this. His blood pressure has been controlled at home, today their BP is    He does not workout. He denies chest pain, shortness of breath, dizziness.   He is on cholesterol medication (rosuvastatin 40mg  daily) and denies myalgias. His cholesterol is at goal. The cholesterol last visit was:   Lab Results  Component Value Date   CHOL 126 05/13/2021   HDL 48 05/13/2021   LDLCALC 65 05/13/2021   TRIG 52 05/13/2021   CHOLHDL 2.6 05/13/2021   He has been working on diet and exercise for T2DM diet controlled with diabetic chronic kidney disease, has been in and out of DM range x 2003, currently with diet and exercise he is in the preDM range, he is on bASA, he is on ACE/ARB, and denies paresthesia of the feet, polydipsia, polyuria and visual disturbances. Last A1C was:  Lab Results  Component Value Date   HGBA1C 6.6 (H) 05/13/2021   Patient is on Vitamin D supplement.   Lab Results  Component Value Date    VD25OH 74 05/13/2021     He has stable CKD II associated with T2DM monitored via this office:  Lab Results  Component Value Date   GFRAA 49 (L) 05/13/2021   Patient is on allopurinol for gout and does not report a recent flare.  Lab Results  Component Value Date   LABURIC 2.6 (L) 05/13/2021    Medication Review: Current Outpatient Medications on File Prior to Visit  Medication Sig Dispense Refill   allopurinol (ZYLOPRIM) 300 MG tablet Take 300 mg by mouth daily.     amLODipine (NORVASC) 10 MG tablet Take 1 tablet (10 mg total) by mouth daily. 30 tablet 1   aspirin 81 MG tablet Take 81 mg by mouth daily.     cetirizine (ZYRTEC) 10 MG tablet Take 10 mg by mouth daily in the afternoon.     Cholecalciferol (VITAMIN D) 125 MCG (5000 UT) CAPS Take 5,000 Units by mouth daily. 30 capsule 0   escitalopram (LEXAPRO) 10 MG tablet Take 10 mg by mouth every evening.     hydrochlorothiazide (HYDRODIURIL) 25 MG tablet Take 25 mg by mouth daily.  losartan (COZAAR) 100 MG tablet Take 100 mg by mouth daily.     LUBRICATING PLUS EYE DROPS 0.5 % SOLN Apply 1 drop to eye 2 (two) times daily.     nitroGLYCERIN (NITROSTAT) 0.4 MG SL tablet Place 0.4 mg under the tongue every 5 (five) minutes as needed for chest pain.     potassium chloride SA (KLOR-CON) 20 MEQ tablet Take    1 tablet      2 x /day      for Potassium (Patient taking differently: Take 20 mEq by mouth 2 (two) times daily.) 180 tablet 0   propranolol (INDERAL) 10 MG tablet Take 10 mg by mouth as needed (anxiety).     pyridOXINE (VITAMIN B-6) 50 MG tablet Take 50 mg by mouth daily.       risperiDONE (RISPERDAL) 2 MG tablet Take 1 mg by mouth at bedtime.     rosuvastatin (CRESTOR) 40 MG tablet Take  1 tablet   Daily  for Cholesterol 90 tablet 3   tamsulosin (FLOMAX) 0.4 MG CAPS capsule Take 0.4 mg by mouth at bedtime.     zolpidem (AMBIEN) 10 MG tablet Take 1 tablet (10 mg total) by mouth at bedtime. 30 tablet 1   No current  facility-administered medications on file prior to visit.    Allergies: No Known Allergies  Current Problems (verified) has ADENOCARCINOMA, PROSTATE; COLONIC POLYPS; Hyperlipidemia associated with type 2 diabetes mellitus ([place]); Essential hypertension; Coronary atherosclerosis; RADIATION PROCTITIS; Diverticulosis of colon; Diet-controlled diabetes mellitus ([place]); Medication management; Vitamin D deficiency; PTSD (post-traumatic stress disorder); Obesity (BMI 30.0-34.9); Gout; CKD (chronic kidney disease), stage III ([place]); Insomnia; Murmur; Microalbuminuria due to type 2 diabetes mellitus ([place]); Hypercalcemia; Syncope and collapse; Sinus bradycardia; and Hyperparathyroidism due to renal insufficiency ([facility]) on their problem list.  Screening Tests Immunization History  Administered Date(s) Administered   Fluad Quad(high Dose 65+) 09/02/2020   Influenza, High Dose Seasonal PF 09/08/2018, 07/24/2019   Influenza-Unspecified 08/22/2014, 09/03/2015, 08/25/2016   Moderna Sars-Covid-2 Vaccination 12/06/2019, 01/03/2020   Pneumococcal Conjugate-13 09/03/2015   Pneumococcal-Unspecified 08/25/2016   Td 11/22/2006   Preventative care: Last colonoscopy: 02/22/2018 due 2022 CXR 2018 Ct head 2013  Prior vaccinations: TD or Tdap: 2008 declines Influenza: 07/2019 Pneumococcal: 2017 Prevnar13: 2016 Shingles/Zostavax: declines  Names of Other Physician/Practitioners you currently use: 1. [facility] here for primary care [facility]. [facility], last visit  June 2018- needs to schedule, reports requested 3. Dr. ?, unknown name, dentist, last visit 2019, q16m  Patient Care Team: Unk Pinto, MD as PCP - General (Internal Medicine) Minus Breeding, MD as PCP - Cardiology (Cardiology) Minus Breeding, MD as Consulting Physician (Cardiology) Irene Shipper, MD as Consulting Physician (Gastroenterology) Kathie Rhodes, MD (Inactive) as Consulting Physician  (Urology)  Surgical: He  has a past surgical history that includes Coronary angioplasty with stent (04/2008); Colonoscopy w/ polypectomy; Mass excision (Left, 10/06/2017); Colonoscopy; IR Radiologist Eval & Mgmt (07/22/2021); and IR Radiologist Eval & Mgmt (10/20/2021). Family His family history includes Heart disease in his father; Hypertension in his father; Hypotension in his mother; Kidney disease in his brother. Social history  He reports that he quit smoking about 29 years ago. His smoking use included cigarettes. He has never used smokeless tobacco. He reports that he does not drink alcohol and does not use drugs.  MEDICARE WELLNESS OBJECTIVES: Physical activity:   Cardiac risk factors:   Depression/mood screen:   Depression screen [facility] 2/9 12/25/2020  Decreased Interest -  Down, Depressed, Hopeless 0  PHQ - 2 Score 0    ADLs:  No flowsheet data found.    Cognitive Testing  Alert? Yes  Normal Appearance?Yes  Oriented to person? Yes  Place? Yes   Time? Yes  Recall of three objects?  Yes  Can perform simple calculations? Yes  Displays appropriate judgment?Yes  Can read the correct time from a watch face?Yes  EOL planning:     Objective:   There were no vitals filed for this visit.  There is no height or weight on file to calculate BMI.  General appearance: alert, no distress, WD/WN, male HEENT: normocephalic, sclerae anicteric, TMs pearly, nares patent, no discharge or erythema, pharynx normal. Mildly HOH.  Oral cavity: MMM, no lesions Neck: supple, no lymphadenopathy, no thyromegaly, no masses Heart: RRR, normal S1, S2, Mild 2/6 systolic murmur without radiation best heard R 2nd ICS Lungs: CTA bilaterally, + worse RLL but bilateral wheezes, but no rhonchi, or rales Abdomen: +bs, obese,  soft, non tender, non distended, no masses, no hepatomegaly, no splenomegaly Musculoskeletal: nontender, no swelling, no obvious deformity, slow antalgic gait Extremities: 1+ edema, no  cyanosis, no clubbing Pulses: 2+ symmetric, upper and lower extremities, normal cap refill Neurological: alert, oriented x 3, CN2-12 intact, strength normal upper extremities and lower extremities, sensation normal throughout, DTRs 2+ throughout, no cerebellar signs, gait slow antalgic Psychiatric: normal affect, behavior normal, pleasant   Medicare Attestation I have personally reviewed: The patient's medical and social history Their use of alcohol, tobacco or illicit drugs Their current medications and supplements The patient's functional ability including ADLs,fall risks, home safety risks, cognitive, and hearing and visual impairment Diet and physical activities Evidence for depression or mood disorders  The patient's weight, height, BMI, and visual acuity have been recorded in the chart.  I have made referrals, counseling, and provided education to the patient based on review of the above and I have provided the patient with a written personalized care plan for preventive services.     Magda Bernheim, NP   12/25/2020

## 2021-12-29 ENCOUNTER — Ambulatory Visit: Payer: Medicare Other | Admitting: Adult Health Nurse Practitioner

## 2021-12-29 ENCOUNTER — Ambulatory Visit: Payer: Medicare Other | Admitting: Nurse Practitioner

## 2021-12-29 DIAGNOSIS — F4310 Post-traumatic stress disorder, unspecified: Secondary | ICD-10-CM

## 2021-12-29 DIAGNOSIS — I2510 Atherosclerotic heart disease of native coronary artery without angina pectoris: Secondary | ICD-10-CM

## 2021-12-29 DIAGNOSIS — M1000 Idiopathic gout, unspecified site: Secondary | ICD-10-CM

## 2021-12-29 DIAGNOSIS — G4700 Insomnia, unspecified: Secondary | ICD-10-CM

## 2021-12-29 DIAGNOSIS — K520 Gastroenteritis and colitis due to radiation: Secondary | ICD-10-CM

## 2021-12-29 DIAGNOSIS — E669 Obesity, unspecified: Secondary | ICD-10-CM

## 2021-12-29 DIAGNOSIS — C61 Malignant neoplasm of prostate: Secondary | ICD-10-CM

## 2021-12-29 DIAGNOSIS — N183 Chronic kidney disease, stage 3 unspecified: Secondary | ICD-10-CM

## 2021-12-29 DIAGNOSIS — Z79899 Other long term (current) drug therapy: Secondary | ICD-10-CM

## 2021-12-29 DIAGNOSIS — N2581 Secondary hyperparathyroidism of renal origin: Secondary | ICD-10-CM

## 2021-12-29 DIAGNOSIS — Z0000 Encounter for general adult medical examination without abnormal findings: Secondary | ICD-10-CM

## 2021-12-29 DIAGNOSIS — E559 Vitamin D deficiency, unspecified: Secondary | ICD-10-CM

## 2021-12-29 DIAGNOSIS — M170 Bilateral primary osteoarthritis of knee: Secondary | ICD-10-CM

## 2021-12-29 DIAGNOSIS — I10 Essential (primary) hypertension: Secondary | ICD-10-CM

## 2021-12-29 DIAGNOSIS — E119 Type 2 diabetes mellitus without complications: Secondary | ICD-10-CM

## 2021-12-29 DIAGNOSIS — E1169 Type 2 diabetes mellitus with other specified complication: Secondary | ICD-10-CM

## 2021-12-29 DIAGNOSIS — E1122 Type 2 diabetes mellitus with diabetic chronic kidney disease: Secondary | ICD-10-CM

## 2022-01-05 NOTE — Progress Notes (Signed)
MEDICARE ANNUAL WELLNESS VISIT AND FOLLOW UP   Assessment:   Encounter for Medicare annual wellness exam Yearly  Essential hypertension - continue medications, DASH diet, exercise and monitor at home. Call if greater than 130/80.  -     CBC with Differential/Platelet -     CMP/GFR Follows with Dr. Percival Spanish  Atherosclerosis of native coronary artery without angina pectoris, unspecified whether native or transplanted heart Control blood pressure, cholesterol, glucose, increase exercise.  Follow up Dr. Percival Spanish  Hyperlipidemia -continue medications, check lipids, decrease fatty foods, increase activity.  -     Lipid panel  Diet-controlled diabetes mellitus ([place]) Discussed general issues about diabetes pathophysiology and management., Educational material distributed., Suggested low cholesterol diet., Encouraged aerobic exercise., Discussed foot care., Reminded to get yearly retinal exam. 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 Gwenlyn Found has advised no further colonoscopy due to age Monitor symptoms  Vitamin D deficiency At goal at recent check; continue to recommend supplementation for goal of 60-100 Defer vitamin D level  PTSD (post-traumatic stress disorder) Continue [facility] follow [facility], stable on medications  BMI 30  - long discussion about weight loss, diet, and exercise -recommended diet heavy in fruits and veggies and low in animal meats, cheeses, and dairy products  Idiopathic gout, unspecified chronicity, unspecified site Gout- recheck Uric acid as needed, Diet discussed, continue medications.  Insomnia - good sleep hygiene discussed, increase day time activity, has tried try melatonin, benadryl,  trazodone Discussed other medications with patient. Declines trial of another medication.  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 Candiss Norse  Further disposition pending results if labs check today. Discussed med's effects and SE's.   Over 30 minutes of face to face interview, exam, counseling, chart review, and critical decision making was performed.    Future Appointments  Date Time Provider [place]  05/13/2022 10:00 AM Unk Pinto, MD [facility] None  01/07/2023 11:00 AM Magda Bernheim, NP [facility] None    Plan:   During the course of the visit the patient was educated and counseled about appropriate screening and preventive services including:   Pneumococcal vaccine  Influenza vaccine Prevnar 13 Td vaccine Screening electrocardiogram Colorectal cancer screening Diabetes screening Glaucoma screening Nutrition counseling    Subjective:  [name] Colon is a 81 y.o. male who presents for Medicare Annual Wellness Visit and 3 month follow up for HTN, hyperlipidemia, diet controlled T2DM, and vitamin D Def.    Has bilateral OA knees. Has mobic that helps some, following with the [place]. Currently doing PT. Has brace/cane but not using.   Patient was treated for Prostate Ca with Brachiotherapy in 2002, was released 2017 and has hx/ radiation colitis and (+) stool hemoccults, had recent colonoscopy 02/2018 , Dr. Gwenlyn Found has advised no further Colonoscopy due to age  He has insomnia treated with trazodone, It was helping with his sleep but in the morning he felt tired and hard to get  going the next day.  He even tried taking half  medication  and still had similar side effects and foggy feeling the next day. He has used amibien in the past with good results and he reports that he would like to use this again.  He has also tried melatonin and benadryl to help with his sleep but he does not sleep through the whole night.  He is currently on Ambien which is filled through the [place]- last appointment was 05/2021  He is having problems with incontinence during the day, leaking.  He is going to the bathroom maybe every four hours. Symptoms have remained stable. He is being followed by Dr. Candiss Norse for a right renal cyst.   He works with [employer] for PTSD, stable on risperidone.   BMI is Body mass index is 30.18 kg/m., he has been working on diet and exercise, knees limit exercise, He has not been drinking much water.  Wt Readings from Last 3 Encounters:  01/07/22 204 lb 6.4 oz (92.7 kg)  07/13/21 195 lb 6.4 oz (88.6 kg)  05/13/21 (P) 197 lb 3.2 oz (89.4 kg)   He is s/p stent in 2009, had normal stress test 02/2016 with Dr. Percival Spanish. Is prescribed nitroglycerine but has never needed to use this. His blood pressure has been controlled at home, today their BP is BP: (!) 144/70 BP Readings from Last 3 Encounters:  01/07/22 (!) 144/70  07/22/21 116/67  07/13/21 130/74     He does not workout. He denies chest pain, shortness of breath, dizziness.   He is on cholesterol medication (rosuvastatin 40mg  daily) and denies myalgias. His cholesterol is at goal. The cholesterol last visit was:   Lab Results  Component Value Date   CHOL 126 05/13/2021   HDL 48 05/13/2021   LDLCALC 65 05/13/2021   TRIG 52 05/13/2021   CHOLHDL 2.6 05/13/2021   He has been working on diet and exercise for T2DM diet controlled with diabetic chronic kidney disease, has been in and out of DM range x 2003, currently with diet and exercise he is in the preDM range, he is on bASA, he is on ACE/ARB, and denies paresthesia of the feet, polydipsia, polyuria and visual disturbances.  Last A1C was:  Lab Results  Component Value Date   HGBA1C 6.6 (H) 05/13/2021   Patient is on Vitamin D supplement.   Lab Results  Component Value Date   VD25OH 74 05/13/2021     He has stable CKD II associated with T2DM monitored via this office:  Lab Results  Component Value Date   GFRAA 49 (L) 05/13/2021   Patient is on allopurinol for gout and does not report a recent flare.  Lab Results  Component Value Date   LABURIC 2.6 (L) 05/13/2021    Medication Review: Current Outpatient Medications on File Prior to Visit  Medication Sig Dispense Refill   allopurinol (ZYLOPRIM) 300 MG tablet Take 300 mg by mouth daily.     amLODipine (NORVASC) 10 MG tablet Take 1 tablet (10 mg total) by mouth daily. 30 tablet 1   aspirin 81 MG tablet Take 81 mg by mouth daily.     cetirizine (ZYRTEC) 10 MG tablet Take 10 mg by mouth daily in the afternoon.     Cholecalciferol (VITAMIN D) 125 MCG (5000 UT) CAPS Take 5,000 Units by mouth daily. 30 capsule 0   escitalopram (LEXAPRO) 10 MG tablet Take 10 mg by mouth every evening.     losartan (COZAAR) 100  MG tablet Take 100 mg by mouth daily.     LUBRICATING PLUS EYE DROPS 0.5 % SOLN Apply 1 drop to eye 2 (two) times daily.     nitroGLYCERIN (NITROSTAT) 0.4 MG SL tablet Place 0.4 mg under the tongue every 5 (five) minutes as needed for chest pain.     potassium chloride SA (KLOR-CON) 20 MEQ tablet Take    1 tablet      2 x /day      for Potassium (Patient taking differently: Take 20 mEq by mouth 2 (two) times daily.) 180 tablet 0   propranolol (INDERAL) 10 MG tablet Take 10 mg by mouth as needed (anxiety).     pyridOXINE (VITAMIN B-6) 50 MG tablet Take 50 mg by mouth daily.       risperiDONE (RISPERDAL) 2 MG tablet Take 1 mg by mouth at bedtime.     rosuvastatin (CRESTOR) 40 MG tablet Take  1 tablet   Daily  for Cholesterol 90 tablet 3   tamsulosin (FLOMAX) 0.4 MG CAPS capsule Take 0.4 mg by mouth at bedtime.     zolpidem (AMBIEN) 10 MG tablet Take 1  tablet (10 mg total) by mouth at bedtime. 30 tablet 1   hydrochlorothiazide (HYDRODIURIL) 25 MG tablet Take 25 mg by mouth daily. (Patient not taking: Reported on 01/07/2022)     No current facility-administered medications on file prior to visit.    Allergies: No Known Allergies  Current Problems (verified) has ADENOCARCINOMA, PROSTATE; COLONIC POLYPS; Hyperlipidemia associated with type 2 diabetes mellitus ([place]); Essential hypertension; Coronary atherosclerosis; RADIATION PROCTITIS; Diverticulosis of colon; Diet-controlled diabetes mellitus ([place]); Medication management; Vitamin D deficiency; PTSD (post-traumatic stress disorder); Obesity (BMI 30.0-34.9); Gout; CKD (chronic kidney disease), stage III ([place]); Insomnia; Murmur; Microalbuminuria due to type 2 diabetes mellitus ([place]); Hypercalcemia; Syncope and collapse; Sinus bradycardia; and Hyperparathyroidism due to renal insufficiency (HCC) on their problem list.  Screening Tests Immunization History  Administered Date(s) Administered   Fluad Quad(high Dose 65+) 09/02/2020   Influenza, High Dose Seasonal PF 09/08/2018, 07/24/2019   Influenza-Unspecified 08/22/2014, 09/03/2015, 08/25/2016   Moderna Sars-Covid-2 Vaccination 12/06/2019, 01/03/2020, 09/22/2020   Pfizer Covid-19 Vaccine Bivalent Booster 25yrs & up 03/03/2021, 08/20/2021   Pneumococcal Conjugate-13 09/03/2015   Pneumococcal-Unspecified 08/25/2016   Td 11/22/2006   Preventative care: Last colonoscopy: 02/22/2018 Dr. Gwenlyn Found decided no further CXR 2018 Ct head 2013  Prior vaccinations: TD or Tdap: 2008 declines Influenza: 2022 at Dr Candiss Norse Pneumococcal: 2017 Prevnar13: 2016 Shingles/Zostavax: declines  Names of Other Physician/Practitioners you currently use: 1. [facility] here for primary care 2. [facility], last visit  05/2021 has appt 5-04/2022 follow catarracts 3.  unknown name, dentist, last visit 2023  Patient Care  Team: Unk Pinto, MD as PCP - General (Internal Medicine) Minus Breeding, MD as PCP - Cardiology (Cardiology) Minus Breeding, MD as Consulting Physician (Cardiology) Irene Shipper, MD as Consulting Physician (Gastroenterology) Kathie Rhodes, MD (Inactive) as Consulting Physician (Urology)  Surgical: He  has a past surgical history that includes Coronary angioplasty with stent (04/2008); Colonoscopy w/ polypectomy; Mass excision (Left, 10/06/2017); Colonoscopy; IR Radiologist Eval & Mgmt (07/22/2021); and IR Radiologist Eval & Mgmt (10/20/2021). Family His family history includes Heart disease in his father; Hypertension in his father; Hypotension in his mother; Kidney disease in his brother. Social history  He reports that he quit smoking about 29 years ago. His smoking use included cigarettes. He has never used smokeless tobacco. He reports that he does  not drink alcohol and does not use drugs.  MEDICARE WELLNESS OBJECTIVES: Physical activity: Current Exercise Habits: The patient does not participate in regular exercise at present, Exercise limited by: orthopedic condition(s) Cardiac risk factors: Cardiac Risk Factors include: advanced age (>60men, >62 women);diabetes mellitus;dyslipidemia;male gender;obesity (BMI >30kg/m2);hypertension;sedentary lifestyle Depression/mood screen:   Depression screen [facility] 2/9 01/07/2022  Decreased Interest 0  Down, Depressed, Hopeless 0  PHQ - 2 Score 0    ADLs:  In your present state of health, do you have any difficulty performing the following activities: 01/07/2022  Hearing? N  Vision? N  Difficulty concentrating or making decisions? N  Walking or climbing stairs? N  Dressing or bathing? N  Doing errands, shopping? N  Some recent data might be hidden      Cognitive Testing  Alert? Yes  Normal Appearance?Yes  Oriented to person? Yes  Place? Yes   Time? Yes  Recall of three objects?  Yes  Can perform simple calculations? Yes  Displays  appropriate judgment?Yes  Can read the correct time from a watch face?Yes  EOL planning: Does Patient Have a Medical Advance Directive?: No Would patient like information on creating a medical advance directive?: No - Patient declined   Objective:   Today's Vitals   01/07/22 1050  BP: (!) 144/70  Pulse: 68  Temp: (!) 97.5 F (36.4 C)  SpO2: 96%  Weight: 204 lb 6.4 oz (92.7 kg)    Body mass index is 30.18 kg/m.  General appearance: alert, no distress, WD/WN, male HEENT: normocephalic, sclerae anicteric, TMs pearly, nares patent, no discharge or erythema, pharynx normal. Mildly HOH.  Oral cavity: MMM, no lesions Neck: supple, no lymphadenopathy, no thyromegaly, no masses Heart: RRR, normal S1, S2, Mild 2/6 systolic murmur without radiation best heard R 2nd ICS Lungs: CTA bilaterally, + worse RLL but bilateral wheezes, but no rhonchi, or rales Abdomen: +bs, obese,  soft, non tender, non distended, no masses, no hepatomegaly, no splenomegaly Musculoskeletal: nontender, no swelling, no obvious deformity, slow antalgic gait Extremities: 1+ edema, no cyanosis, no clubbing Pulses: 2+ symmetric, upper and lower extremities, normal cap refill Neurological: alert, oriented x 3, CN2-12 intact, strength normal upper extremities and lower extremities, sensation normal throughout, DTRs 2+ throughout, no cerebellar signs, gait slow antalgic Psychiatric: normal affect, behavior normal, pleasant   Medicare Attestation I have personally reviewed: The patient's medical and social history Their use of alcohol, tobacco or illicit drugs Their current medications and supplements The patient's functional ability including ADLs,fall risks, home safety risks, cognitive, and hearing and visual impairment Diet and physical activities Evidence for depression or mood disorders  The patient's weight, height, BMI, and visual acuity have been recorded in the chart.  I have made referrals, counseling, and  provided education to the patient based on review of the above and I have provided the patient with a written personalized care plan for preventive services.     Magda Bernheim, NP   12/25/2020

## 2022-01-07 ENCOUNTER — Ambulatory Visit (INDEPENDENT_AMBULATORY_CARE_PROVIDER_SITE_OTHER): Payer: Medicare Other | Admitting: Nurse Practitioner

## 2022-01-07 ENCOUNTER — Other Ambulatory Visit: Payer: Self-pay

## 2022-01-07 ENCOUNTER — Encounter: Payer: Self-pay | Admitting: Nurse Practitioner

## 2022-01-07 VITALS — BP 144/70 | HR 68 | Temp 97.5°F | Wt 204.4 lb

## 2022-01-07 DIAGNOSIS — N1831 Chronic kidney disease, stage 3a: Secondary | ICD-10-CM

## 2022-01-07 DIAGNOSIS — E1169 Type 2 diabetes mellitus with other specified complication: Secondary | ICD-10-CM

## 2022-01-07 DIAGNOSIS — F5101 Primary insomnia: Secondary | ICD-10-CM

## 2022-01-07 DIAGNOSIS — N183 Chronic kidney disease, stage 3 unspecified: Secondary | ICD-10-CM

## 2022-01-07 DIAGNOSIS — E119 Type 2 diabetes mellitus without complications: Secondary | ICD-10-CM

## 2022-01-07 DIAGNOSIS — E669 Obesity, unspecified: Secondary | ICD-10-CM

## 2022-01-07 DIAGNOSIS — Z0001 Encounter for general adult medical examination with abnormal findings: Secondary | ICD-10-CM | POA: Diagnosis not present

## 2022-01-07 DIAGNOSIS — K520 Gastroenteritis and colitis due to radiation: Secondary | ICD-10-CM

## 2022-01-07 DIAGNOSIS — E1122 Type 2 diabetes mellitus with diabetic chronic kidney disease: Secondary | ICD-10-CM

## 2022-01-07 DIAGNOSIS — E66811 Obesity, class 1: Secondary | ICD-10-CM

## 2022-01-07 DIAGNOSIS — K5730 Diverticulosis of large intestine without perforation or abscess without bleeding: Secondary | ICD-10-CM

## 2022-01-07 DIAGNOSIS — N2581 Secondary hyperparathyroidism of renal origin: Secondary | ICD-10-CM | POA: Diagnosis not present

## 2022-01-07 DIAGNOSIS — I2510 Atherosclerotic heart disease of native coronary artery without angina pectoris: Secondary | ICD-10-CM

## 2022-01-07 DIAGNOSIS — F4310 Post-traumatic stress disorder, unspecified: Secondary | ICD-10-CM

## 2022-01-07 DIAGNOSIS — M170 Bilateral primary osteoarthritis of knee: Secondary | ICD-10-CM

## 2022-01-07 DIAGNOSIS — R6889 Other general symptoms and signs: Secondary | ICD-10-CM | POA: Diagnosis not present

## 2022-01-07 DIAGNOSIS — C61 Malignant neoplasm of prostate: Secondary | ICD-10-CM

## 2022-01-07 DIAGNOSIS — N281 Cyst of kidney, acquired: Secondary | ICD-10-CM

## 2022-01-07 DIAGNOSIS — Z0000 Encounter for general adult medical examination without abnormal findings: Secondary | ICD-10-CM

## 2022-01-07 DIAGNOSIS — D126 Benign neoplasm of colon, unspecified: Secondary | ICD-10-CM

## 2022-01-07 DIAGNOSIS — E785 Hyperlipidemia, unspecified: Secondary | ICD-10-CM

## 2022-01-07 DIAGNOSIS — E559 Vitamin D deficiency, unspecified: Secondary | ICD-10-CM

## 2022-01-07 DIAGNOSIS — M1000 Idiopathic gout, unspecified site: Secondary | ICD-10-CM

## 2022-01-07 DIAGNOSIS — I10 Essential (primary) hypertension: Secondary | ICD-10-CM

## 2022-01-07 DIAGNOSIS — Z79899 Other long term (current) drug therapy: Secondary | ICD-10-CM

## 2022-01-08 LAB — CBC WITH DIFFERENTIAL/PLATELET
Absolute Monocytes: 422 cells/uL (ref 200–950)
Basophils Absolute: 38 cells/uL (ref 0–200)
Basophils Relative: 0.8 %
Eosinophils Absolute: 149 cells/uL (ref 15–500)
Eosinophils Relative: 3.1 %
HCT: 46.7 % (ref 38.5–50.0)
Hemoglobin: 14.6 g/dL (ref 13.2–17.1)
Lymphs Abs: 1598 cells/uL (ref 850–3900)
MCH: 25.8 pg — ABNORMAL LOW (ref 27.0–33.0)
MCHC: 31.3 g/dL — ABNORMAL LOW (ref 32.0–36.0)
MCV: 82.7 fL (ref 80.0–100.0)
MPV: 10.5 fL (ref 7.5–12.5)
Monocytes Relative: 8.8 %
Neutro Abs: 2592 cells/uL (ref 1500–7800)
Neutrophils Relative %: 54 %
Platelets: 162 10*3/uL (ref 140–400)
RBC: 5.65 10*6/uL (ref 4.20–5.80)
RDW: 14.4 % (ref 11.0–15.0)
Total Lymphocyte: 33.3 %
WBC: 4.8 10*3/uL (ref 3.8–10.8)

## 2022-01-08 LAB — HEMOGLOBIN A1C
Hgb A1c MFr Bld: 6.1 % of total Hgb — ABNORMAL HIGH (ref <5.7)
Mean Plasma Glucose: 128 mg/dL
eAG (mmol/L): 7.1 mmol/L

## 2022-01-08 LAB — COMPLETE METABOLIC PANEL WITH GFR
AG Ratio: 1.5 (calc) (ref 1.0–2.5)
ALT: 17 U/L (ref 9–46)
AST: 21 U/L (ref 10–35)
Albumin: 4.4 g/dL (ref 3.6–5.1)
Alkaline phosphatase (APISO): 69 U/L (ref 35–144)
BUN/Creatinine Ratio: 8 (calc) (ref 6–22)
BUN: 13 mg/dL (ref 7–25)
CO2: 26 mmol/L (ref 20–32)
Calcium: 10.8 mg/dL — ABNORMAL HIGH (ref 8.6–10.3)
Chloride: 108 mmol/L (ref 98–110)
Creat: 1.69 mg/dL — ABNORMAL HIGH (ref 0.70–1.22)
Globulin: 2.9 g/dL (calc) (ref 1.9–3.7)
Glucose, Bld: 133 mg/dL — ABNORMAL HIGH (ref 65–99)
Potassium: 4.1 mmol/L (ref 3.5–5.3)
Sodium: 142 mmol/L (ref 135–146)
Total Bilirubin: 0.6 mg/dL (ref 0.2–1.2)
Total Protein: 7.3 g/dL (ref 6.1–8.1)
eGFR: 40 mL/min/{1.73_m2} — ABNORMAL LOW (ref >=60)

## 2022-01-08 LAB — PTH, INTACT AND CALCIUM
Calcium: 10.8 mg/dL — ABNORMAL HIGH (ref 8.6–10.3)
PTH: 42 pg/mL (ref 16–77)

## 2022-01-08 LAB — LIPID PANEL
Cholesterol: 114 mg/dL (ref <200)
HDL: 48 mg/dL (ref >=40)
LDL Cholesterol (Calc): 52 mg/dL (calc)
Non-HDL Cholesterol (Calc): 66 mg/dL (calc) (ref <130)
Total CHOL/HDL Ratio: 2.4 (calc) (ref <5.0)
Triglycerides: 64 mg/dL (ref <150)

## 2022-02-15 ENCOUNTER — Telehealth: Payer: Self-pay | Admitting: *Deleted

## 2022-02-15 NOTE — Telephone Encounter (Signed)
? ?  Pre-operative Risk Assessment  ?  ?Patient Name: [name] Colon  ?DOB: [birth year] ?MRN: [id number]  ? ? ? ?Request for Surgical Clearance   ? ?Procedure:  extractions ? ?Date of Surgery:  Clearance TBD                              ?   ?Surgeon:  elise newsome [facility]ds ?Surgeon's Group or Practice Name:  [facility] ?Phone number:  [phone] ?Fax number:  [phone] ?  ?Type of Clearance Requested:   ?- Medical  ?  ?Type of Anesthesia:  Not Indicated ?  ?Additional requests/questions:   ? ?Signed, ?Fredia Beets   ?02/15/2022, 4:34 PM   ?

## 2022-02-15 NOTE — Telephone Encounter (Signed)
Please get more info tomorrow, thanks!  As below, [name] Colon tried to call but now closed. ?

## 2022-02-16 NOTE — Telephone Encounter (Signed)
? ?  Patient Name: [name] Colon  ?DOB: Apr 15, 1941 ?MRN: [id number] ? ?Primary Cardiologist: Minus Breeding, MD ? ?Chart reviewed as part of pre-operative protocol coverage. Last OV 06/2021. Dr. Percival Spanish, we are asked to hold ASA for surgical tooth extraction of multiple teeth. Per your note, "He did have cardiac catheterization 2009.  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  There was 99% distal circumflex stenosis.  The EF was well-preserved.  He underwent PCI and stenting of the circumflex.  He was otherwise managed medically. " Last ischemic assessment was by nuc in 2017 that overall was low risk with a defect felt c/w diaphragmatic attenuation. Echo 03/2021 showed EF 55-60%. Are you OK with patient holding ASA 5-7 days prior to dental extractions? - Please route response to P CV DIV PREOP (the pre-op pool). Thank you. ? ? ?Charlie Pitter, PA-C ?02/16/2022, 2:23 PM ? ? ?

## 2022-02-16 NOTE — Telephone Encounter (Signed)
Left message for the dental office to call back to please confirm how many teeth will the pt be having extracted. Please tell us how many teeth and not the number of the tooth.  ?

## 2022-02-16 NOTE — Telephone Encounter (Signed)
I s/w the DDS office and confirmed needed information.  ? ?Addendum to clearance notes: ? ?Procedure is 6 teeth surgically extracted ?Anesthesia is going to be lidocaine ?Will need to hold ASA, will follow recommendations from cardiologist for ASA hold time.  ?

## 2022-02-18 NOTE — Telephone Encounter (Signed)
His aspirin may be held but Dr. Percival Spanish would like to minimize the amount of time he is off of his aspirin. ? ?Preoperative team, please contact this patient and set up a phone call appointment for further cardiac evaluation.  Thank you for your help. ? ?Jossie Ng. Shenita Trego NP-C ? ?  ?02/18/2022, 8:07 AM ?[place] ?[address] ?Office [phone] Fax [phone] ? ?

## 2022-02-18 NOTE — Telephone Encounter (Signed)
I tried to call both the primary number for the pt and the DPR and both vm are full, I could not leave a message for call back. I will send FYI to requesting office in hopes that they may s/w the pt to please tell the pt call his cardiologist office [phone].  ?

## 2022-02-19 NOTE — Telephone Encounter (Signed)
Tried to reach the pt again today, though vm is still full.  ?

## 2022-02-22 ENCOUNTER — Telehealth: Payer: Self-pay | Admitting: *Deleted

## 2022-02-22 NOTE — Telephone Encounter (Signed)
Pt agreeable to plan of care for tele pre op appt 02/26/22 @ 9 am. Med rec and consent are done.  ?

## 2022-02-22 NOTE — Telephone Encounter (Signed)
Pt agreeable to plan of care for tele pre op appt 02/26/22 @ 9 am. Med rec and consent are done.  ? ?  ?Patient Consent for Virtual Visit  ? ? ?   ? ?[name] Colon has provided verbal consent on 02/22/2022 for a virtual visit (video or telephone). ? ? ?CONSENT FOR VIRTUAL VISIT FOR:  [name] Colon  ?By participating in this virtual visit I agree to the following: ? ?I hereby voluntarily request, consent and authorize [facility] and its employed or contracted physicians, physician assistants, nurse practitioners or other licensed health care professionals (the Practitioner), to provide me with telemedicine health care services (the ?Services") as deemed necessary by the treating Practitioner. I acknowledge and consent to receive the Services by the Practitioner via telemedicine. I understand that the telemedicine visit will involve communicating with the Practitioner through live audiovisual communication technology and the disclosure of certain medical information by electronic transmission. I acknowledge that I have been given the opportunity to request an in-person assessment or other available alternative prior to the telemedicine visit and am voluntarily participating in the telemedicine visit. ? ?I understand that I have the right to withhold or withdraw my consent to the use of telemedicine in the course of my care at any time, without affecting my right to future care or treatment, and that the Practitioner or I may terminate the telemedicine visit at any time. I understand that I have the right to inspect all information obtained and/or recorded in the course of the telemedicine visit and may receive copies of available information for a reasonable fee.  I understand that some of the potential risks of receiving the Services via telemedicine include:  ?Delay or interruption in medical evaluation due to technological equipment failure or disruption; ?Information transmitted may not be sufficient (e.g.  poor resolution of images) to allow for appropriate medical decision making by the Practitioner; and/or  ?In rare instances, security protocols could fail, causing a breach of personal health information. ? ?Furthermore, I acknowledge that it is my responsibility to provide information about my medical history, conditions and care that is complete and accurate to the best of my ability. I acknowledge that Practitioner's advice, recommendations, and/or decision may be based on factors not within their control, such as incomplete or inaccurate data provided by me or distortions of diagnostic images or specimens that may result from electronic transmissions. I understand that the practice of medicine is not an exact science and that Practitioner makes no warranties or guarantees regarding treatment outcomes. I acknowledge that a copy of this consent can be made available to me via my patient portal ([place]), or I can request a printed copy by calling the office of [facility].   ? ?I understand that my insurance will be billed for this visit.  ? ?I have read or had this consent read to me. ?I understand the contents of this consent, which adequately explains the benefits and risks of the Services being provided via telemedicine.  ?I have been provided ample opportunity to ask questions regarding this consent and the Services and have had my questions answered to my satisfaction. ?I give my informed consent for the services to be provided through the use of telemedicine in my medical care ? ? ? ?

## 2022-02-26 ENCOUNTER — Ambulatory Visit (INDEPENDENT_AMBULATORY_CARE_PROVIDER_SITE_OTHER): Payer: Medicare Other | Admitting: Nurse Practitioner

## 2022-02-26 DIAGNOSIS — Z01810 Encounter for preprocedural cardiovascular examination: Secondary | ICD-10-CM | POA: Diagnosis not present

## 2022-02-26 NOTE — Progress Notes (Addendum)
? ?Virtual Visit via Telephone Note  ? ?This visit type was conducted due to national recommendations for restrictions regarding the COVID-19 Pandemic (e.g. social distancing) in an effort to limit this patient's exposure and mitigate transmission in our community.  Due to his co-morbid illnesses, this patient is at least at moderate risk for complications without adequate follow up.  This format is felt to be most appropriate for this patient at this time.  The patient did not have access to video technology/had technical difficulties with video requiring transitioning to audio format only (telephone).  All issues noted in this document were discussed and addressed.  No physical exam could be performed with this format.  Please refer to the patient's chart for his  consent to telehealth for [facility]. ? ?Evaluation Performed:  Preoperative cardiovascular risk assessment ?_____________  ? ?Date:  02/26/2022  ? ?Patient ID:  [name] Colon, [name] Colon 08-19-1941, MRN [id number] ?Patient Location:  ?Home ?Provider location:   ?Office ? ?Primary Care Provider:  Unk Pinto, MD ?Primary Cardiologist:  Minus Breeding, MD ? ?Chief Complaint  ?  ?81 y.o. y/o male with a h/o CAD s/p DES-LCx in 2009, low risk Myoview in 2017, hypertension, hyperlipidemia, and syncope (resolved off beta-blocker therapy), who is pending extraction of 6 teeth, and presents today for telephonic preoperative cardiovascular risk assessment. ? ?Past Medical History  ?  ?Past Medical History:  ?Diagnosis Date  ? 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 s/p seed implantation  ? Arthritis   ? Axillary abscess   ? left  ? CAD (coronary artery disease)   ? Diverticulitis, colon   ? History of colonic polyps   ? HTN (hypertension)   ? Hyperglycemia   ? diet controlled  ? Hyperlipidemia   ? 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 ?Past Surgical History:  ?Procedure Laterality Date  ? COLONOSCOPY    ? COLONOSCOPY W/ POLYPECTOMY    ? CORONARY  ANGIOPLASTY WITH STENT PLACEMENT  04/2008  ? IR RADIOLOGIST EVAL & MGMT  07/22/2021  ? IR RADIOLOGIST EVAL & MGMT  10/20/2021  ? MASS EXCISION Left 10/06/2017  ? Procedure: EXCISION LEFT AXILLA CYST;  Surgeon: Erroll Luna, MD;  Location: [place];  Service: General;  Laterality: Left;  ? ? ?Allergies ? ?No Known Allergies ? ?History of Present Illness  ?  ?[name] Colon is a 81 y.o. male who presents via audio/video conferencing for a telehealth visit today.  Pt was last seen in cardiology clinic on 07/13/2021 by Dr. Percival Spanish.  At that time [name] Colon was doing well from a cardiac standpoint.  The patient is now pending surgical extraction of 6 teeth.  Since his last visit, he has done well from a cardiac standpoint.  He denies any new or concerning symptoms. ? ?He denies chest pain, palpitations, dyspnea, pnd, orthopnea, n, v, dizziness, syncope, edema, weight gain, or early satiety. All other systems reviewed and are otherwise negative except as noted above.  ? ?Home Medications  ?  ?Prior to Admission medications   ?Medication Sig Start Date End Date Taking? Authorizing Provider  ?allopurinol (ZYLOPRIM) 300 MG tablet Take 300 mg by mouth daily.    [provider]  ?amLODipine (NORVASC) 10 MG tablet Take 1 tablet (10 mg total) by mouth daily. 03/28/21   Lorella Nimrod, MD  ?aspirin 81 MG tablet Take 81 mg by mouth daily.    [provider]  ?cetirizine (ZYRTEC) 10 MG tablet Take  10 mg by mouth daily in the afternoon.    [provider]  ?Cholecalciferol (VITAMIN D) 125 MCG (5000 UT) CAPS Take 5,000 Units by mouth daily. 07/10/20   Liane Comber, NP  ?escitalopram (LEXAPRO) 10 MG tablet Take 10 mg by mouth every evening. 03/06/21   [provider]  ?losartan (COZAAR) 100 MG tablet Take 100 mg by mouth daily. 03/06/21   [provider]  ?LUBRICATING PLUS EYE DROPS 0.5 % SOLN Apply 1 drop to eye 2 (two) times daily. 11/10/20   [provider]  ?nitroGLYCERIN (NITROSTAT) 0.4 MG SL tablet Place 0.4 mg under the tongue every 5 (five) minutes as needed for chest pain.    [provider]  ?potassium chloride SA (KLOR-CON) 20 MEQ tablet Take    1 tablet      2 x /day      for Potassium ?Patient taking differently: Take 20 mEq by mouth 2 (two) times daily. 08/29/20   Unk Pinto, MD  ?propranolol (INDERAL) 10 MG tablet Take 10 mg by mouth as needed (anxiety).    [provider]  ?pyridOXINE (VITAMIN B-6) 50 MG tablet Take 50 mg by mouth daily.      [provider]  ?risperiDONE (RISPERDAL) 2 MG tablet Take 1 mg by mouth at bedtime.    [provider]  ?rosuvastatin (CRESTOR) 40 MG tablet Take  1 tablet   Daily  for Cholesterol 11/29/21   Unk Pinto, MD  ?tamsulosin (FLOMAX) 0.4 MG CAPS capsule Take 0.4 mg by mouth at bedtime.    [provider]  ?zolpidem (AMBIEN) 10 MG tablet Take 1 tablet (10 mg total) by mouth at bedtime. 12/25/20   Garnet Sierras, NP  ? ? ?Physical Exam  ?  ?Vital Signs:  [name] Colon does not have vital signs available for review today. ? ?Given telephonic nature of communication, physical exam is limited. ?AAOx3. NAD. Normal affect.  Speech and respirations are unlabored. ? ?Accessory Clinical Findings  ?  ?None ? ?Assessment & Plan  ?  ?1.  Preoperative Cardiovascular Risk Assessment: ? ?According to the Revised Cardiac Risk Index (RCRI), his Perioperative Risk of Major Cardiac Event is (%): 0.9. His Functional Capacity in METs is: 5.07 according to the [facility] Activity Status Index (DASI). Therefore, based on ACC/AHA guidelines, patient would be at acceptable risk for the planned procedure without further cardiovascular testing. I will route this recommendation to the requesting party via Epic fax function. ? ?Per Dr. Percival Spanish, he would prefer not to hold ASA unless the procedure cannot safely be done on ASA.  If held it needs to be for the lowest number of days per the operating  provider.  ? ?A copy of this note will be routed to requesting surgeon. ? ?Time:   ?Today, I have spent 5 minutes with the patient with telehealth technology discussing medical history, symptoms, and management plan.   ? ? ?Lenna Sciara, NP ? ?02/26/2022, 9:07 AM ? ?

## 2022-03-28 NOTE — Progress Notes (Signed)
?Name: [name] Colon  ?MRN/ DOB: [id number], February 25, 1941   ?Age/ Sex: 81 y.o., male   ? ?PCP: Unk Pinto, MD   ?Reason for Endocrinology Evaluation: Type 2 Diabetes Mellitus  ?   ?Date of Initial Endocrinology Visit: 03/29/2022   ? ? ?PATIENT IDENTIFIER: Mr. [name] Colon is a 80 y.o. male with a past medical history of T2Dm, CAD, HTN. The patient presented for initial endocrinology clinic visit on 03/29/2022 for consultative assistance with his diabetes management.  ? ? ?HPI: ?Mr. Kant is a poor historian and unable to provide detailed history ? ?His referral was made under the diagnosis of diabetes mellitus.  But the patient tells me he is not sure he is ever been diagnosed with diabetes ?Diagnosed with DM 2017 A1c 6.6% at the time ?Prior Medications tried/Intolerance: N/A ?Currently checking blood sugars 0 x ?Hemoglobin A1c has ranged from 6.1% in 2017, peaking at 6.5% in 2023. ?Patient required assistance for hypoglycemia:  ?Patient has required hospitalization within the last 1 year from hyper or hypoglycemia:  ? ?In terms of diet, the patient eats 2 meals , avoids sugar sweetened beverages  ? ? ? ? ?HYPERCALCEMIA HISTORY: ? ?Pt has been noted with hypercalcemia since 10/2019 with a max level of 11.4 in 04/2021 with an inappropriately normal PTH at 42 ? ? ?He has polyuria and polydipsia  ?He has chronic constipation  ?Denies osteoporosis, no bone fracture ? ?He is not on calcium tablets  ?He eats at least 1 servings of calcium a day  ?  ? ? ?HOME DIABETES REGIMEN: ?N/A ? ? ?Statin: yes ?ACE-I/ARB: yes ? ? ?METER DOWNLOAD SUMMARY: does not check  ? ? ?DIABETIC COMPLICATIONS: ?Microvascular complications:  ?CKD III ?Denies: neuropathy, retinopathy  ?Last eye exam: scheduled 03/29/2022 ? ?Macrovascular complications:  ? ?Denies: CAD, PVD, CVA ? ? ?PAST HISTORY: ?Past Medical History:  ?Past Medical History:  ?Diagnosis Date  ? 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 s/p seed implantation  ? Arthritis   ?  Axillary abscess   ? left  ? CAD (coronary artery disease)   ? Diverticulitis, colon   ? History of colonic polyps   ? HTN (hypertension)   ? Hyperglycemia   ? diet controlled  ? Hyperlipidemia   ? 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 ?Past Surgical History:  ?Past Surgical History:  ?Procedure Laterality Date  ? COLONOSCOPY    ? COLONOSCOPY W/ POLYPECTOMY    ? CORONARY ANGIOPLASTY WITH STENT PLACEMENT  04/2008  ? IR RADIOLOGIST EVAL & MGMT  07/22/2021  ? IR RADIOLOGIST EVAL & MGMT  10/20/2021  ? MASS EXCISION Left 10/06/2017  ? Procedure: EXCISION LEFT AXILLA CYST;  Surgeon: Erroll Luna, MD;  Location: [place];  Service: General;  Laterality: Left;  ?  ?Social History:  reports that he quit smoking about 29 years ago. His smoking use included cigarettes. He has never used smokeless tobacco. He reports that he does not drink alcohol and does not use drugs. ?Family History:  ?Family History  ?Problem Relation Age of Onset  ? Heart disease Father   ? Hypertension Father   ? Hypotension Mother   ? Kidney disease Brother   ? Colon cancer Neg Hx   ? ? ? ?HOME MEDICATIONS: ?Allergies as of 03/29/2022   ?No Known Allergies ?  ? ?  ?Medication List  ?  ? ?  ? Accurate as of Mar 29, 2022  8:05 AM. If you have  any questions, ask your nurse or doctor.  ?  ?  ? ?  ? ?allopurinol 300 MG tablet ?Commonly known as: ZYLOPRIM ?Take 300 mg by mouth daily. ?  ?amLODipine 10 MG tablet ?Commonly known as: NORVASC ?Take 1 tablet (10 mg total) by mouth daily. ?  ?aspirin 81 MG tablet ?Take 81 mg by mouth daily. ?  ?cetirizine 10 MG tablet ?Commonly known as: ZYRTEC ?Take 10 mg by mouth daily in the afternoon. ?  ?escitalopram 10 MG tablet ?Commonly known as: LEXAPRO ?Take 10 mg by mouth every evening. ?  ?losartan 100 MG tablet ?Commonly known as: COZAAR ?Take 100 mg by mouth daily. ?  ?Lubricating Plus Eye Drops 0.5 % Soln ?Generic drug: Carboxymethylcellulose Sod PF ?Apply 1 drop to eye 2 (two) times  daily. ?  ?nitroGLYCERIN 0.4 MG SL tablet ?Commonly known as: NITROSTAT ?Place 0.4 mg under the tongue every 5 (five) minutes as needed for chest pain. ?  ?potassium chloride SA 20 MEQ tablet ?Commonly known as: KLOR-CON M ?Take    1 tablet      2 x /day      for Potassium ?What changed:  ?how much to take ?how to take this ?when to take this ?additional instructions ?  ?propranolol 10 MG tablet ?Commonly known as: INDERAL ?Take 10 mg by mouth as needed (anxiety). ?  ?pyridOXINE 50 MG tablet ?Commonly known as: VITAMIN B-6 ?Take 50 mg by mouth daily. ?  ?risperiDONE 2 MG tablet ?Commonly known as: RISPERDAL ?Take 1 mg by mouth at bedtime. ?  ?rosuvastatin 40 MG tablet ?Commonly known as: CRESTOR ?Take  1 tablet   Daily  for Cholesterol ?  ?tamsulosin 0.4 MG Caps capsule ?Commonly known as: FLOMAX ?Take 0.4 mg by mouth at bedtime. ?  ?Vitamin D 125 MCG (5000 UT) Caps ?Take 5,000 Units by mouth daily. ?  ?zolpidem 10 MG tablet ?Commonly known as: AMBIEN ?Take 1 tablet (10 mg total) by mouth at bedtime. ?  ? ?  ? ? ? ?ALLERGIES: ?No Known Allergies ? ? ?REVIEW OF SYSTEMS: ?A comprehensive ROS was conducted with the patient and is negative except as per HPI  ?  ?OBJECTIVE:  ? ?VITAL SIGNS: BP 136/80 (BP Location: Left Arm, Patient Position: Sitting, Cuff Size: Small)   Pulse 60   Ht _0  (1.753 m)   Wt 198 lb 9.6 oz (90.1 kg)   SpO2 95%   BMI 29.33 kg/m?   ? ?PHYSICAL EXAM:  ?General: Pt appears well and is in NAD  ?Neck: General: Supple without adenopathy or carotid bruits. ?Thyroid: Thyroid size normal.  No goiter or nodules appreciated.  ?Lungs: Clear with good BS bilat with no rales, rhonchi, or wheezes  ?Heart: RRR with normal S1 and S2 and no gallops; no murmurs; no rub  ?Abdomen: Normoactive bowel sounds, soft, nontender, without masses or organomegaly palpable  ?Extremities:  ?Lower extremities - No pretibial edema. No lesions.  ?Neuro: MS is good with appropriate affect, pt is alert and Ox3  ? ? ?DATA  REVIEWED: ? ?Lab Results  ?Component Value Date  ? HGBA1C 6.1 (H) 01/07/2022  ? HGBA1C 6.6 (H) 05/13/2021  ? HGBA1C 6.4 (H) 09/09/2020  ? ? Latest Reference Range & Units 01/07/22 11:33  ?Sodium 135 - 146 mmol/L 142  ?Potassium 3.5 - 5.3 mmol/L 4.1  ?Chloride 98 - 110 mmol/L 108  ?CO2 20 - 32 mmol/L 26  ?Glucose 65 - 99 mg/dL 133 (H)  ?Mean Plasma Glucose mg/dL 128  ?  BUN 7 - 25 mg/dL 13  ?Creatinine 0.70 - 1.22 mg/dL 1.69 (H)  ?Calcium 8.6 - 10.3 mg/dL ?8.6 - 10.3 mg/dL 10.8 (H) ?10.8 (H)  ?BUN/Creatinine Ratio 6 - 22 (calc) 8  ?eGFR > OR = 60 mL/min/1.41m 40 (L)  ?AG Ratio 1.0 - 2.5 (calc) 1.5  ?AST 10 - 35 U/L 21  ?ALT 9 - 46 U/L 17  ?Total Protein 6.1 - 8.1 g/dL 7.3  ?Total Bilirubin 0.2 - 1.2 mg/dL 0.6  ? ?ASSESSMENT / PLAN / RECOMMENDATIONS:  ? ?1) Type 2 Diabetes Mellitus, Optimally controlled, With CKD III complications - Most recent A1c of 6.5 %. Goal A1c < 7.0 %.   ? ?-His A1c is optimal at this time ?-He already avoids sugar sweetened beverages, patient also advised to avoid eating sweets ?-No indication to start any glycemic agents at this time ?-He was shown how to use glucose meter and was advised to check glucose 2-3 times a week ? ?MEDICATIONS: ?N/A ? ?EDUCATION / INSTRUCTIONS: ?BG monitoring instructions: Patient is instructed to check his blood sugars 2-3 times a 1 week, fasting. ?[facility] if: BG persistently < 70  ?I reviewed the Rule of 15 for the treatment of hypoglycemia in detail with the patient. Literature supplied. ? ? ?2) Diabetic complications:  ?Eye: Does not have known diabetic retinopathy.  ?Neuro/ Feet: Does not have known diabetic peripheral neuropathy. ?Renal: Patient does  have known baseline CKD.  He is under the care of nephrology ? ? ?3) Hyperparathyroidism: ? ?-Patient will need further evaluation to determine surgical candidacy ?-Given advanced age he will probably benefit with medical treatment rather than surgical ?-We will proceed with 24-hour urine  collection for calcium excretion as well as bone density to screen for osteoporosis ? ? ? ?Recommendations ?Stay hydrated ?Avoid over-the-counter calcium tablets ?Consume 2-3 dietary servings of calcium daily ? ?

## 2022-03-29 ENCOUNTER — Ambulatory Visit (INDEPENDENT_AMBULATORY_CARE_PROVIDER_SITE_OTHER): Payer: Medicare Other | Admitting: Internal Medicine

## 2022-03-29 ENCOUNTER — Encounter: Payer: Self-pay | Admitting: Internal Medicine

## 2022-03-29 VITALS — BP 136/80 | HR 60 | Ht 69.0 in | Wt 198.6 lb

## 2022-03-29 DIAGNOSIS — N1832 Chronic kidney disease, stage 3b: Secondary | ICD-10-CM | POA: Diagnosis not present

## 2022-03-29 DIAGNOSIS — E213 Hyperparathyroidism, unspecified: Secondary | ICD-10-CM | POA: Diagnosis not present

## 2022-03-29 DIAGNOSIS — E1122 Type 2 diabetes mellitus with diabetic chronic kidney disease: Secondary | ICD-10-CM | POA: Diagnosis not present

## 2022-03-29 DIAGNOSIS — R739 Hyperglycemia, unspecified: Secondary | ICD-10-CM | POA: Diagnosis not present

## 2022-03-29 LAB — POCT GLYCOSYLATED HEMOGLOBIN (HGB A1C): Hemoglobin A1C: 6.5 % — AB (ref 4.0–5.6)

## 2022-03-29 LAB — POCT GLUCOSE (DEVICE FOR HOME USE): Glucose Fasting, POC: 119 mg/dL — AB (ref 70–99)

## 2022-03-29 MED ORDER — ONETOUCH VERIO VI STRP: 1.0000 | ORAL_STRIP | Freq: Every day | 3 refills | Status: DC

## 2022-03-29 NOTE — Patient Instructions (Addendum)
-   Stay hydrated  ?- Avoid over the counter calcium  ?- Consume 2-3 servings of dietary calcium ( low fat cheese/milk/yogurt and green leafy vegetables) ? ?- Check sugar 2-3 times a week  ? ? ? ?-24-Hour Urine Collection ? ?You will be collecting your urine for a 24-hour period of time. ?Your timer starts with your first urine of the morning (For example - If you first pee at [place], your timer will start at [place]) ?Throw away your first urine of the morning ?Collect your urine every time you pee for the next 24 hours ?STOP your urine collection 24 hours after you started the collection (For example - You would stop at 9AM the day after you started) ? ? ?Bone density will be done at  [facility] office located at [place] [place]  ?Phone  [phone]  ? ?

## 2022-04-06 ENCOUNTER — Other Ambulatory Visit: Payer: Medicare Other

## 2022-04-21 ENCOUNTER — Encounter: Payer: Medicare Other | Admitting: Internal Medicine

## 2022-05-12 NOTE — Patient Instructions (Signed)

## 2022-05-12 NOTE — Progress Notes (Signed)
Comprehensive Evaluation & Examination  Future Appointments  Date Time Provider Department  05/13/2022             CPE 10:00 AM Unk Pinto, MD [facility]  09/30/2022  7:50 AM Shamleffer, Melanie Crazier, MD [facility]  01/07/2023             Wellness 11:00 AM Alycia Rossetti, NP [facility]  05/17/2023             CPE 10:00 AM Unk Pinto, MD [facility]              This very nice 81 y.o. MBM presents for a comprehensive evaluation and management of multiple medical co-morbidities.  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 Patient has hx/o Gout controlled on Allopurinol. Patient was treated by Brachytherapy for  Prostate Cancer  in 2004.                                                   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 Dr Candiss Norse also referred patient to Dr Kelton Pillar who confirmed his suspicions of secondary hyperparathyroidism.        HTN predates since 53. Patient's BP has been controlled at home.Today's BP is   borderline high normal at 144/84.     In 2009,  patient had Coronary PCA with Stent implanted & he had a negative Lexiscan 2017.  Patient denies any cardiac symptoms as chest pain, palpitations, shortness of breath, dizziness or ankle swelling.       Patient's hyperlipidemia is controlled with diet and medications. Patient denies myalgias or other medication SE's. 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   Patient denies reactive hypoglycemic symptoms, visual blurring, diabetic polys or paresthesias.   Patient admits not checking his CBG's as advised by all of his doctors.  Last A1c was not at goal :   Lab Results  Component Value Date   HGBA1C 6.5 (A) 03/29/2022        Finally, patient has history of Vitamin D Deficiency ("38" / 2012)  and last vitamin D was at goal :   Lab Results  Component Value Date   VD25OH 74 05/13/2021        Current Outpatient Medications on File Prior to Visit  Medication Sig   allopurinol 300 MG tablet Take daily.   amLODipine 10 MG tablet Take 1 tablet daily.   aspirin 81 MG tablet Take  daily.   cetirizine  10 MG tablet Take daily in the afternoon.   VITAMIN D 5000 u Take  daily.   Escitalopram 10 MG tablet Take every evening.   hydrochlorothiazide 25 MG tablet Take daily.   losartan  100 MG tablet Take daily.   LUBRICATING EYE DROPS SOLN Apply 1 drop to eye 2 times daily.   NITROSTAT)0.4 MG SL tablet as needed for chest pain.  potassium chloride 20 MEQ tablet Take 1 tablet 2 x /day     propranolol 10 MG tablet Take as needed (anxiety).   pyridOXINE VIT, B-650 MG tablet Take  daily.     risperiDONE 2 MG tablet Take at bedtime.   rosuvastatin 40 MG tablet Take 4at bedtime.   tamsulosin 0.4 MG CAPS capsule Take at bedtime.   zolpidem 10 MG tablet Take 1 tablet at bedtime.    No Known Allergies   Health Maintenance  Topic Date Due   Zoster Vaccines- Shingrix (1 of 2) Never done   OPHTHALMOLOGY EXAM  04/27/2018   COVID-19 Vaccine (3 - Moderna risk series) 01/31/2020   FOOT EXAM  02/24/2021   HEMOGLOBIN A1C  03/10/2021   INFLUENZA VACCINE  06/22/2021   TETANUS/TDAP  03/17/2027   PNA vac Low Risk Adult  Completed   HPV VACCINES  Aged Out     Immunization History  Administered Date(s) Administered   Fluad Quad (high Dose ) 09/02/2020   Influenza, High Dose  09/08/2018, 07/24/2019   Influenza 08/22/2014, 09/03/2015, 08/25/2016   Moderna Sars-Covid-2 Vacc 12/06/2019, 01/03/2020   Pneumococcal -13 09/03/2015   Pneumococcal-23  08/25/2016   Td 11/22/2006    Last Colon -  02/22/2018 - Dr Henrene Pastor - Multiple Polyps - recc 3 yr f/u if medically stable - due Apr 2022  Past Surgical History:  Procedure Laterality Date   COLONOSCOPY  02/22/2018   COLONOSCOPY W/ POLYPECTOMY     CORONARY ANGIOPLASTY WITH STENT   04/2008   MASS EXCISION Left 10/06/2017   Procedure: EXCISION LEFT AXILLA CYST;  Erroll Luna, MD     Family History  Problem Relation Age of Onset   Heart disease Father    Hypertension Father    Hypotension Mother    Kidney disease Brother    Colon cancer Neg Hx     Social History   Socioeconomic History   Marital status: Married    Spouse name: Not on file   Number of children: 2  Occupational History   Occupation: retired    Fish farm manager: Retired  Tobacco Use   Smoking status: Former    Pack years: 0.00    Types: Cigarettes    Quit date: 11/22/1992    Years since quitting: 28.4   Smokeless tobacco: Never  Substance and Sexual Activity   Alcohol use: No   Drug use: No   Sexual activity: Not Currently    ROS Constitutional: Denies fever, chills, weight loss/gain, headaches, insomnia,  night sweats or change in appetite. Does c/o fatigue. Eyes: Denies redness, blurred vision, diplopia, discharge, itchy or watery eyes.  ENT: Denies discharge, congestion, post nasal drip, epistaxis, sore throat, earache, hearing loss, dental pain, Tinnitus, Vertigo, Sinus pain or snoring.  Cardio: Denies chest pain, palpitations, irregular heartbeat, syncope, dyspnea, diaphoresis, orthopnea, PND, claudication or edema Respiratory: denies cough, dyspnea, DOE, pleurisy, hoarseness, laryngitis or wheezing.  Gastrointestinal: Denies dysphagia, heartburn, reflux, water brash, pain, cramps, nausea, vomiting, bloating, diarrhea, constipation, hematemesis, melena, hematochezia, jaundice or hemorrhoids Genitourinary: Denies dysuria, frequency, discharge, hematuria or flank pain. Has urgency, nocturia x 2-3 & occasional  hesitancy. Musculoskeletal: Denies arthralgia, myalgia, stiffness, Jt. Swelling, pain, limp or strain/sprain. Denies Falls. Skin: Denies puritis, rash, hives, warts, acne, eczema or change in skin lesion Neuro: No weakness, tremor, incoordination, spasms, paresthesia or pain Psychiatric: Denies confusion, memory loss or sensory loss. Denies Depression. Endocrine: Denies change in weight, skin, hair change, nocturia, and paresthesia, diabetic polys, visual blurring or hyper /  hypo glycemic episodes.  Heme/Lymph: No excessive bleeding, bruising or enlarged lymph nodes.   Physical Exam  BP (!) 144/84   Pulse 63   Temp 97.9 F (36.6 C)   Resp 16   Ht 5' 8.5" (1.74 m)   Wt 198 lb 12.8 oz (90.2 kg)   SpO2 95%   BMI 29.79 kg/m   General Appearance: Well nourished and well groomed and in no apparent distress.  Eyes: PERRLA, EOMs, conjunctiva no swelling or erythema, normal fundi and vessels. Sinuses: No frontal/maxillary tenderness ENT/Mouth: EACs patent / TMs  nl. Nares clear without erythema, swelling, mucoid exudates. Oral hygiene is good. No erythema, swelling, or exudate. Tongue normal, non-obstructing. Tonsils not swollen or erythematous. Hearing normal.  Neck: Supple, thyroid not palpable. No bruits, nodes or JVD. Respiratory: Respiratory effort normal.  BS equal and clear bilateral without rales, rhonci, wheezing or stridor. Cardio: Heart sounds are normal with regular rate and rhythm and no murmurs, rubs or gallops. Peripheral pulses are normal and equal bilaterally without edema. No aortic or femoral bruits. Chest: symmetric with normal excursions and percussion.  Abdomen: Soft, with Nl bowel sounds. Nontender, no guarding, rebound, hernias, masses, or organomegaly.  Lymphatics: Non tender without lymphadenopathy.  Musculoskeletal: Full ROM all peripheral extremities, joint stability, 5/5 strength, and normal gait. Skin: Warm and dry without rashes, lesions, cyanosis, clubbing or   ecchymosis.  Neuro: Cranial nerves intact, reflexes equal bilaterally. Normal muscle tone, no cerebellar symptoms. Sensation intact to touch, vibratory and Monofilament to the toes bilaterally. Pysch: Alert and oriented X 3 with normal affect, insight and judgment appropriate.   Assessment and Plan   1. Essential hypertension  - EKG 12-Lead - [place], RETROPERITNL ABD,  LTD - Microalbumin / creatinine urine ratio - CBC with Differential/Platelet - COMPLETE METABOLIC PANEL WITH GFR - Magnesium - TSH  2. Hyperlipidemia associated with type 2 diabetes mellitus ([place])  - EKG 12-Lead - [place], RETROPERITNL ABD,  LTD - Lipid panel - TSH  3. Type 2 diabetes mellitus with stage 3a chronic kidney  disease, without long-term current use of insulin (HCC)  - EKG 12-Lead - [place], RETROPERITNL ABD,  LTD - HM DIABETES FOOT EXAM - LOW EXTREMITY NEUR EXAM DOCUM - PTH, intact and calcium - Hemoglobin A1c  4. Vitamin D deficiency  - VITAMIN D 25 Hydroxy   5. Diet-controlled diabetes mellitus ([place])  - Hemoglobin A1c - Insulin, random  6. Secondary hyperparathyroidism of renal origin (HCC)  - PTH, intact and calcium  7. Atherosclerosis of native coronary artery of native heart without angina pectoris  - EKG 12-Lead - [place], RETROPERITNL ABD,  LTD  8. Idiopathic gout  - Uric acid  9. FHx: heart disease  - EKG 12-Lead - [place], RETROPERITNL ABD,  LTD - PSA  10. Former smoker  - EKG 12-Lead - [place], RETROPERITNL ABD,  LTD  11. Screening for ischemic heart disease  - EKG 12-Lead  12. Screening for AAA (aortic abdominal aneurysm)  - [place], RETROPERITNL ABD,  LTD  13. History of prostate cancer  - PSA  14. Screening for colorectal cancer  - POC Hemoccult Bld/Stl   15. Medication management  - Uric acid - PTH, intact and calcium - CBC with Differential/Platelet - COMPLETE METABOLIC PANEL WITH GFR - Magnesium - Lipid panel - TSH       Patient was counseled in prudent diet,  weight control to achieve/maintain BMI less than 25, BP monitoring, regular exercise and medications as discussed.  Discussed med effects  and SE's. Routine screening labs and tests as requested with regular follow-up as recommended. Over 40 minutes of exam, counseling, chart review and high complex critical decision making was performed   Kirtland Bouchard, MD

## 2022-05-13 ENCOUNTER — Ambulatory Visit (INDEPENDENT_AMBULATORY_CARE_PROVIDER_SITE_OTHER): Payer: Medicare Other | Admitting: Internal Medicine

## 2022-05-13 ENCOUNTER — Encounter: Payer: Self-pay | Admitting: Internal Medicine

## 2022-05-13 VITALS — BP 144/84 | HR 63 | Temp 97.9°F | Resp 16 | Ht 68.5 in | Wt 198.8 lb

## 2022-05-13 DIAGNOSIS — I10 Essential (primary) hypertension: Secondary | ICD-10-CM

## 2022-05-13 DIAGNOSIS — I2510 Atherosclerotic heart disease of native coronary artery without angina pectoris: Secondary | ICD-10-CM

## 2022-05-13 DIAGNOSIS — I700 Atherosclerosis of aorta: Secondary | ICD-10-CM

## 2022-05-13 DIAGNOSIS — Z8249 Family history of ischemic heart disease and other diseases of the circulatory system: Secondary | ICD-10-CM

## 2022-05-13 DIAGNOSIS — Z136 Encounter for screening for cardiovascular disorders: Secondary | ICD-10-CM | POA: Diagnosis not present

## 2022-05-13 DIAGNOSIS — E119 Type 2 diabetes mellitus without complications: Secondary | ICD-10-CM | POA: Diagnosis not present

## 2022-05-13 DIAGNOSIS — M1000 Idiopathic gout, unspecified site: Secondary | ICD-10-CM

## 2022-05-13 DIAGNOSIS — Z1211 Encounter for screening for malignant neoplasm of colon: Secondary | ICD-10-CM

## 2022-05-13 DIAGNOSIS — Z79899 Other long term (current) drug therapy: Secondary | ICD-10-CM

## 2022-05-13 DIAGNOSIS — Z87891 Personal history of nicotine dependence: Secondary | ICD-10-CM

## 2022-05-13 DIAGNOSIS — N2581 Secondary hyperparathyroidism of renal origin: Secondary | ICD-10-CM

## 2022-05-13 DIAGNOSIS — Z125 Encounter for screening for malignant neoplasm of prostate: Secondary | ICD-10-CM

## 2022-05-13 DIAGNOSIS — E1169 Type 2 diabetes mellitus with other specified complication: Secondary | ICD-10-CM

## 2022-05-13 DIAGNOSIS — E559 Vitamin D deficiency, unspecified: Secondary | ICD-10-CM

## 2022-05-13 DIAGNOSIS — E1122 Type 2 diabetes mellitus with diabetic chronic kidney disease: Secondary | ICD-10-CM

## 2022-05-13 DIAGNOSIS — Z8546 Personal history of malignant neoplasm of prostate: Secondary | ICD-10-CM

## 2022-05-13 NOTE — Progress Notes (Signed)
Aorta Scan < 3 cm 

## 2022-05-15 ENCOUNTER — Other Ambulatory Visit: Payer: Self-pay | Admitting: Internal Medicine

## 2022-05-15 DIAGNOSIS — E1122 Type 2 diabetes mellitus with diabetic chronic kidney disease: Secondary | ICD-10-CM

## 2022-05-15 DIAGNOSIS — E1169 Type 2 diabetes mellitus with other specified complication: Secondary | ICD-10-CM

## 2022-05-15 DIAGNOSIS — E119 Type 2 diabetes mellitus without complications: Secondary | ICD-10-CM

## 2022-05-15 DIAGNOSIS — I10 Essential (primary) hypertension: Secondary | ICD-10-CM

## 2022-05-15 DIAGNOSIS — N1831 Chronic kidney disease, stage 3a: Secondary | ICD-10-CM

## 2022-05-18 LAB — URINALYSIS, ROUTINE W REFLEX MICROSCOPIC
Bacteria, UA: NONE SEEN /HPF
Bilirubin Urine: NEGATIVE
Ketones, ur: NEGATIVE
Leukocytes,Ua: NEGATIVE
Nitrite: NEGATIVE
RBC / HPF: NONE SEEN /HPF (ref 0–2)
Specific Gravity, Urine: 1.015 (ref 1.001–1.035)
Squamous Epithelial / HPF: NONE SEEN /HPF (ref <=5)
WBC, UA: NONE SEEN /HPF (ref 0–5)
pH: 6 (ref 5.0–8.0)

## 2022-05-18 LAB — HEMOGLOBIN A1C
Hgb A1c MFr Bld: 6 % of total Hgb — ABNORMAL HIGH (ref <5.7)
Mean Plasma Glucose: 126 mg/dL
eAG (mmol/L): 7 mmol/L

## 2022-05-18 LAB — CBC WITH DIFFERENTIAL/PLATELET
Absolute Monocytes: 753 cells/uL (ref 200–950)
Basophils Absolute: 14 cells/uL (ref 0–200)
Basophils Relative: 0.1 %
Eosinophils Absolute: 0 cells/uL — ABNORMAL LOW (ref 15–500)
Eosinophils Relative: 0 %
HCT: 47.3 % (ref 38.5–50.0)
Hemoglobin: 14.8 g/dL (ref 13.2–17.1)
Lymphs Abs: 1193 cells/uL (ref 850–3900)
MCH: 25.5 pg — ABNORMAL LOW (ref 27.0–33.0)
MCHC: 31.3 g/dL — ABNORMAL LOW (ref 32.0–36.0)
MCV: 81.6 fL (ref 80.0–100.0)
MPV: 10.7 fL (ref 7.5–12.5)
Monocytes Relative: 5.3 %
Neutro Abs: 12240 cells/uL — ABNORMAL HIGH (ref 1500–7800)
Neutrophils Relative %: 86.2 %
Platelets: 191 10*3/uL (ref 140–400)
RBC: 5.8 10*6/uL (ref 4.20–5.80)
RDW: 14.2 % (ref 11.0–15.0)
Total Lymphocyte: 8.4 %
WBC: 14.2 10*3/uL — ABNORMAL HIGH (ref 3.8–10.8)

## 2022-05-18 LAB — MAGNESIUM: Magnesium: 2.6 mg/dL — ABNORMAL HIGH (ref 1.5–2.5)

## 2022-05-18 LAB — COMPLETE METABOLIC PANEL WITH GFR
AG Ratio: 1.6 (calc) (ref 1.0–2.5)
ALT: 18 U/L (ref 9–46)
AST: 23 U/L (ref 10–35)
Albumin: 4.6 g/dL (ref 3.6–5.1)
Alkaline phosphatase (APISO): 80 U/L (ref 35–144)
BUN/Creatinine Ratio: 15 (calc) (ref 6–22)
BUN: 21 mg/dL (ref 7–25)
CO2: 21 mmol/L (ref 20–32)
Calcium: 11.1 mg/dL — ABNORMAL HIGH (ref 8.6–10.3)
Chloride: 106 mmol/L (ref 98–110)
Creat: 1.39 mg/dL — ABNORMAL HIGH (ref 0.70–1.22)
Globulin: 2.8 g/dL (calc) (ref 1.9–3.7)
Glucose, Bld: 158 mg/dL — ABNORMAL HIGH (ref 65–99)
Potassium: 3.9 mmol/L (ref 3.5–5.3)
Sodium: 142 mmol/L (ref 135–146)
Total Bilirubin: 0.5 mg/dL (ref 0.2–1.2)
Total Protein: 7.4 g/dL (ref 6.1–8.1)
eGFR: 51 mL/min/{1.73_m2} — ABNORMAL LOW (ref >=60)

## 2022-05-18 LAB — MICROALBUMIN / CREATININE URINE RATIO
Creatinine, Urine: 73 mg/dL (ref 20–320)
Microalb Creat Ratio: 318 mcg/mg creat — ABNORMAL HIGH (ref <30)
Microalb, Ur: 23.2 mg/dL

## 2022-05-18 LAB — LIPID PANEL
Cholesterol: 128 mg/dL (ref <200)
HDL: 52 mg/dL (ref >=40)
LDL Cholesterol (Calc): 62 mg/dL (calc)
Non-HDL Cholesterol (Calc): 76 mg/dL (calc) (ref <130)
Total CHOL/HDL Ratio: 2.5 (calc) (ref <5.0)
Triglycerides: 63 mg/dL (ref <150)

## 2022-05-18 LAB — URIC ACID: Uric Acid, Serum: 3.1 mg/dL — ABNORMAL LOW (ref 4.0–8.0)

## 2022-05-18 LAB — TEST AUTHORIZATION

## 2022-05-18 LAB — TSH: TSH: 1.91 mIU/L (ref 0.40–4.50)

## 2022-05-18 LAB — MICROSCOPIC MESSAGE

## 2022-05-18 LAB — PSA: PSA: <0.04 ng/mL (ref <=4.00)

## 2022-05-18 LAB — INSULIN, RANDOM: Insulin: 135.1 u[IU]/mL — ABNORMAL HIGH

## 2022-07-21 ENCOUNTER — Telehealth: Payer: Self-pay | Admitting: *Deleted

## 2022-07-21 ENCOUNTER — Telehealth: Payer: Self-pay | Admitting: Cardiology

## 2022-07-21 NOTE — Telephone Encounter (Signed)
   Name: BRAIN HONEYCUTT  DOB: 09/23/1941  MRN: [id number]  Primary Cardiologist: Minus Breeding, MD   Preoperative team, please contact this patient and set up a phone call appointment for further preoperative risk assessment. Please obtain consent and complete medication review. Thank you for your help.  I confirm that guidance regarding antiplatelet and oral anticoagulation therapy has been completed and, if necessary, noted below. -Ideally would continue ASA. With spinal anesthesia, risk is high for bleeding. Would be ok to hold x 7 days.  Richardson Dopp, PA-C 07/21/2022, 1:32 PM [facility]

## 2022-07-21 NOTE — Telephone Encounter (Signed)
Pt has been scheduled for a tele visit, 07/30/22 9:40.  Consent on file / medications reconciled.    Patient Consent for Virtual Visit        [name] Colon has provided verbal consent on 07/21/2022 for a virtual visit (video or telephone).   CONSENT FOR VIRTUAL VISIT FOR:  [name] Colon  By participating in this virtual visit I agree to the following:  I hereby voluntarily request, consent and authorize [facility] and its employed or contracted physicians, physician assistants, nurse practitioners or other licensed health care professionals (the Practitioner), to provide me with telemedicine health care services (the "Services") as deemed necessary by the treating Practitioner. I acknowledge and consent to receive the Services by the Practitioner via telemedicine. I understand that the telemedicine visit will involve communicating with the Practitioner through live audiovisual communication technology and the disclosure of certain medical information by electronic transmission. I acknowledge that I have been given the opportunity to request an in-person assessment or other available alternative prior to the telemedicine visit and am voluntarily participating in the telemedicine visit.  I understand that I have the right to withhold or withdraw my consent to the use of telemedicine in the course of my care at any time, without affecting my right to future care or treatment, and that the Practitioner or I may terminate the telemedicine visit at any time. I understand that I have the right to inspect all information obtained and/or recorded in the course of the telemedicine visit and may receive copies of available information for a reasonable fee.  I understand that some of the potential risks of receiving the Services via telemedicine include:  Delay or interruption in medical evaluation due to technological equipment failure or disruption; Information transmitted may not be  sufficient (e.g. poor resolution of images) to allow for appropriate medical decision making by the Practitioner; and/or  In rare instances, security protocols could fail, causing a breach of personal health information.  Furthermore, I acknowledge that it is my responsibility to provide information about my medical history, conditions and care that is complete and accurate to the best of my ability. I acknowledge that Practitioner's advice, recommendations, and/or decision may be based on factors not within their control, such as incomplete or inaccurate data provided by me or distortions of diagnostic images or specimens that may result from electronic transmissions. I understand that the practice of medicine is not an exact science and that Practitioner makes no warranties or guarantees regarding treatment outcomes. I acknowledge that a copy of this consent can be made available to me via my patient portal ([facility]), or I can request a printed copy by calling the office of [facility].    I understand that my insurance will be billed for this visit.   I have read or had this consent read to me. I understand the contents of this consent, which adequately explains the benefits and risks of the Services being provided via telemedicine.  I have been provided ample opportunity to ask questions regarding this consent and the Services and have had my questions answered to my satisfaction. I give my informed consent for the services to be provided through the use of telemedicine in my medical care   \

## 2022-07-21 NOTE — Telephone Encounter (Signed)
Pt has been scheduled for tele visit, 07/30/22 9:40.

## 2022-07-21 NOTE — Telephone Encounter (Signed)
   Pre-operative Risk Assessment    Patient Name: [name] Colon  DOB: May 25, 1941 MRN: [id number]      Request for Surgical Clearance    Procedure:   Right Knee Replacement  Date of Surgery:  Clearance TBD                                 Surgeon:  Dr. Frederik Pear Surgeon's Group or Practice Name:  [facility] Phone number:  [phone] Fax number:  [phone]   Type of Clearance Requested:   - Medical  - Pharmacy:  Hold        Type of Anesthesia:  Spinal   Additional requests/questions:   Caller is requesting medical and pharmacy clearance.  Signed, Heloise Beecham   07/21/2022, 8:55 AM

## 2022-07-27 ENCOUNTER — Ambulatory Visit
Admission: RE | Admit: 2022-07-27 | Discharge: 2022-07-27 | Disposition: A | Discharge status: Home / Self Care | Payer: Medicare Other | Source: Ambulatory Visit | Attending: Nurse Practitioner | Admitting: Nurse Practitioner

## 2022-07-27 ENCOUNTER — Ambulatory Visit (INDEPENDENT_AMBULATORY_CARE_PROVIDER_SITE_OTHER): Payer: Medicare Other | Admitting: Nurse Practitioner

## 2022-07-27 VITALS — BP 128/70 | HR 57 | Temp 97.7°F | Ht 69.0 in | Wt 192.0 lb

## 2022-07-27 DIAGNOSIS — Z0000 Encounter for general adult medical examination without abnormal findings: Secondary | ICD-10-CM | POA: Diagnosis not present

## 2022-07-27 DIAGNOSIS — I2510 Atherosclerotic heart disease of native coronary artery without angina pectoris: Secondary | ICD-10-CM

## 2022-07-27 DIAGNOSIS — Z01818 Encounter for other preprocedural examination: Secondary | ICD-10-CM

## 2022-07-27 DIAGNOSIS — E1169 Type 2 diabetes mellitus with other specified complication: Secondary | ICD-10-CM

## 2022-07-27 DIAGNOSIS — E1122 Type 2 diabetes mellitus with diabetic chronic kidney disease: Secondary | ICD-10-CM

## 2022-07-27 DIAGNOSIS — E1129 Type 2 diabetes mellitus with other diabetic kidney complication: Secondary | ICD-10-CM

## 2022-07-27 DIAGNOSIS — Z136 Encounter for screening for cardiovascular disorders: Secondary | ICD-10-CM

## 2022-07-27 DIAGNOSIS — I10 Essential (primary) hypertension: Secondary | ICD-10-CM

## 2022-07-27 DIAGNOSIS — E559 Vitamin D deficiency, unspecified: Secondary | ICD-10-CM

## 2022-07-27 DIAGNOSIS — E213 Hyperparathyroidism, unspecified: Secondary | ICD-10-CM

## 2022-07-27 DIAGNOSIS — N1831 Chronic kidney disease, stage 3a: Secondary | ICD-10-CM

## 2022-07-27 DIAGNOSIS — R790 Abnormal level of blood mineral: Secondary | ICD-10-CM

## 2022-07-27 NOTE — Patient Instructions (Signed)

## 2022-07-27 NOTE — Progress Notes (Signed)
Surgical Clearance    Assessment:    Pre-op evaluation  - EKG 12-Lead - DG Chest 2 View; Future  Essential hypertension Discussed DASH (Dietary Approaches to Stop Hypertension) DASH diet is lower in sodium than a typical American diet. Cut back on foods that are high in saturated fat, cholesterol, and trans fats. Eat more whole-grain foods, fish, poultry, and nuts Remain active and exercise as tolerated daily.  Monitor BP at home-Call if greater than 130/80.  Follows with Dr. Percival Spanish  - CBC with Differential/Platelet - COMPLETE METABOLIC PANEL WITH GFR - EKG 12-Lead  Atherosclerosis of native coronary artery of native heart without angina pectoris/Hyperlipidemia Discussed lifestyle modifications. Recommended diet heavy in fruits and veggies, omega 3's. Decrease consumption of animal meats, cheeses, and dairy products. Remain active and exercise as tolerated. Continue to monitor.  - Lipid panel  Vitamin D deficiency  - VITAMIN D 25 Hydroxy (Vit-D Deficiency, Fractures)  Stage 3a chronic kidney disease ([place]) Discussed how what you eat and drink can aide in kidney protection. Stay well hydrated. Avoid high salt foods. Avoid NSAIDS. Keep BP and BG well controlled.   Take medications as prescribed. Remain active and exercise as tolerated daily. Maintain weight.   - COMPLETE METABOLIC PANEL WITH GFR  Microalbuminuria due to type 2 diabetes mellitus (HCC)  - Urinalysis, Routine w reflex microscopic  Hyperparathyroidism ([place])  - COMPLETE METABOLIC PANEL WITH GFR  Type 2 diabetes mellitus with stage 3b chronic kidney disease, without long-term current use of insulin (HCC)  - COMPLETE METABOLIC PANEL WITH GFR - Hemoglobin A1c  10. Low magnesium level  - Magnesium   Further disposition pending results if labs check today. Discussed med's effects and SE's.    Over 20 minutes of face to face interview, exam, counseling, chart review, and critical decision  making was performed.     Plan:    1. Preoperative workup as follows chest x-ray. 2. Change in medication regimen before surgery: none, continue medication regimen including morning of surgery, with sip of water. 3. Prophylaxis for cardiac events with perioperative beta-blockers: should be considered, specific regimen per anesthesia. 4. Invasive hemodynamic monitoring perioperatively: at the discretion of anesthesiologist. 5. Deep vein thrombosis prophylaxis postoperatively:regimen to be chosen by surgical team. 6. Surveillance for postoperative MI with ECG immediately postoperatively and on postoperative days 1 and 2 AND troponin levels 24 hours postoperatively and on day 4 or hospital discharge (whichever comes first): at the discretion of anesthesiologist. 7. Other measures: Careful attention to perioperative glycemic control (Monitor BG).    Subjective:    [name] Colon is a 81 y.o. male who presents to the office today for a preoperative consultation at the request of surgeon Dr. Kathalene Frames. Mayer Camel, MD with [place] who plans on performing a RIGHT TKNEE ARTHROPLASTY TBD based on clearance. This consultation is requested for the specific conditions prompting preoperative evaluation (i.e. because of potential affect on operative risk): HTN, Coronary atherosclerosis, Hyperlipidemia, Sinus Bradycardia, DM2, Hyperparathyroidism, CKD3,  Vitamin D Deficiency, Obesity, medication management. Planned anesthesia: spinal. The patient has the following known anesthesia issues:  no anesthesia problems . Patients bleeding risk: no recent abnormal bleeding. Patient does not have objections to receiving blood products if needed.  The following portions of the patient's history were reviewed and updated as appropriate: allergies, current medications, past family history, past medical history, past social history, past surgical history, and problem list.  Review of Systems Pertinent items are  noted in HPI.   Objective:  Today's Vitals   07/27/22 1116  BP: 128/70  Pulse: (!) 57  Temp: 97.7 F (36.5 C)  SpO2: 97%  Weight: 192 lb (87.1 kg)  Height: '5\' 9"'$  (1.753 m)   Body mass index is 28.35 kg/m.   General appearance: alert, cooperative, appears stated age, and no distress Head: Normocephalic, without obvious abnormality, atraumatic Eyes: conjunctivae/corneas clear. PERRL, EOM's intact. Fundi benign. Ears: normal TM's and external ear canals both ears Nose: Nares normal. Septum midline. Mucosa normal. No drainage or sinus tenderness. Throat: lips, mucosa, and tongue normal; teeth and gums normal Neck: no adenopathy, no carotid bruit, no JVD, supple, symmetrical, trachea midline, and thyroid not enlarged, symmetric, no tenderness/mass/nodules Back: symmetric, no curvature. ROM normal. No CVA tenderness. Lungs: clear to auscultation bilaterally Chest wall: no tenderness Heart: Regular with mumur. Abdomen: soft, non-tender; bowel sounds normal; no masses,  no organomegaly Extremities: extremities normal, atraumatic, no cyanosis or edema Pulses: 2+ and symmetric Lymph nodes: normal Neurologic: Grossly normal  Cardiographics ECG: SB    Imaging Chest x-ray: Remote   Lab Review  Updated labs obtained.   81 y.o. male with planned surgery as above.    He is s/p stent in 2009, had normal stress test 02/2016 with Dr. Percival Spanish. Is prescribed nitroglycerine but has never needed to use this. His blood pressure has been controlled at home, today their BP is BP: 128/70  He does not workout. He denies chest pain, shortness of breath, dizziness.   He is on cholesterol medication (rosuvastatin '40mg'$  daily) and denies myalgias. His cholesterol is at goal. The cholesterol last visit was:   Lab Results  Component Value Date   CHOL 128 05/13/2022   HDL 52 05/13/2022   LDLCALC 62 05/13/2022   TRIG 63 05/13/2022   CHOLHDL 2.5 05/13/2022   He has been working on diet and  exercise for T2DM diet controlled with diabetic chronic kidney disease, has been in and out of DM range x 2003, currently with diet and exercise he is in the preDM range, he is on bASA, he is on ACE/ARB, and denies paresthesia of the feet, polydipsia, polyuria and visual disturbances. Last A1C was:  Lab Results  Component Value Date   HGBA1C 6.0 (H) 05/13/2022   Patient is on Vitamin D supplement.   Lab Results  Component Value Date   VD25OH 74 05/13/2021     He has stable CKD II associated with T2DM monitored via this office:  Lab Results  Component Value Date   GFRAA 49 (L) 05/13/2021   Patient is on allopurinol for gout and does not report a recent flare.  Lab Results  Component Value Date   LABURIC 3.1 (L) 05/13/2022    Known risk factors for perioperative complications: Coronary disease Diabetes mellitus Renal dysfunction   Difficulty with intubation unknown.    Future Appointments  Date Time Provider [place]  07/30/2022  9:40 AM [facility] PRE OP CLEARANCE APP [facility]  08/20/2022  9:30 AM Alycia Rossetti, NP [facility] None  09/30/2022  7:50 AM Shamleffer, Melanie Crazier, MD [facility] None  11/19/2022  9:30 AM Unk Pinto, MD [facility] None  02/21/2023  9:30 AM Alycia Rossetti, NP [facility] None  05/23/2023 10:00 AM Unk Pinto, MD [facility] None    Current medications which may produce withdrawal symptoms if withheld perioperatively: Escitalopram, Zolpidem  Medication Review: Current Outpatient Medications on File Prior to Visit  Medication Sig Dispense Refill   allopurinol (ZYLOPRIM) 300 MG tablet Take 300 mg by  mouth daily.     amLODipine (NORVASC) 10 MG tablet Take 1 tablet (10 mg total) by mouth daily. 30 tablet 1   aspirin 81 MG tablet Take 81 mg by mouth daily.     cetirizine (ZYRTEC) 10 MG tablet Take 10 mg by mouth daily in the afternoon.     Cholecalciferol (VITAMIN D) 125 MCG (5000 UT) CAPS Take 5,000 Units by  mouth daily. 30 capsule 0   escitalopram (LEXAPRO) 10 MG tablet Take 10 mg by mouth every evening.     glucose blood (ONETOUCH VERIO) test strip 1 each by Other route daily in the afternoon. Use as instructed 100 each 3   losartan (COZAAR) 100 MG tablet Take 100 mg by mouth daily.     LUBRICATING PLUS EYE DROPS 0.5 % SOLN Apply 1 drop to eye 2 (two) times daily.     nitroGLYCERIN (NITROSTAT) 0.4 MG SL tablet Place 0.4 mg under the tongue every 5 (five) minutes as needed for chest pain.     potassium chloride SA (KLOR-CON) 20 MEQ tablet Take    1 tablet      2 x /day      for Potassium (Patient taking differently: Take 20 mEq by mouth 2 (two) times daily.) 180 tablet 0   propranolol (INDERAL) 10 MG tablet Take 10 mg by mouth as needed (anxiety).     pyridOXINE (VITAMIN B-6) 50 MG tablet Take 50 mg by mouth daily.       risperiDONE (RISPERDAL) 2 MG tablet Take 1 mg by mouth at bedtime.     rosuvastatin (CRESTOR) 40 MG tablet Take  1 tablet   Daily  for Cholesterol 90 tablet 3   tamsulosin (FLOMAX) 0.4 MG CAPS capsule Take 0.4 mg by mouth at bedtime.     zolpidem (AMBIEN) 10 MG tablet Take 1 tablet (10 mg total) by mouth at bedtime. 30 tablet 1   No current facility-administered medications on file prior to visit.    Allergies: No Known Allergies  Current Problems (verified) has ADENOCARCINOMA, PROSTATE; COLONIC POLYPS; Hyperlipidemia associated with type 2 diabetes mellitus ([place]); Essential hypertension; Coronary atherosclerosis; RADIATION PROCTITIS; Diverticulosis of colon; Diet-controlled diabetes mellitus ([place]); Medication management; Vitamin D deficiency; PTSD (post-traumatic stress disorder); Obesity (BMI 30.0-34.9); Gout; CKD (chronic kidney disease), stage III ([place]); Insomnia; Murmur; Microalbuminuria due to type 2 diabetes mellitus ([place]); Hypercalcemia; Syncope and collapse; Sinus bradycardia; Hyperparathyroidism due to renal insufficiency ([place]); Hyperparathyroidism ([place]); and Type 2  diabetes mellitus with stage 3b chronic kidney disease, without long-term current use of insulin ([place]) on their problem list.  Screening Tests Immunization History  Administered Date(s) Administered   Fluad Quad(high Dose 65+) 09/02/2020   Influenza, High Dose Seasonal PF 09/08/2018, 07/24/2019   Influenza-Unspecified 08/22/2014, 09/03/2015, 08/25/2016   Moderna Sars-Covid-2 Vaccination 12/06/2019, 01/03/2020, 09/22/2020   Pfizer Covid-19 Vaccine Bivalent Booster 1yr & up 03/03/2021, 08/20/2021   Pneumococcal Conjugate-13 09/03/2015   Pneumococcal-Unspecified 08/25/2016   Td 11/22/2006    Patient Care Team: MUnk Pinto MD as PCP - General (Internal Medicine) HMinus Breeding MD as PCP - Cardiology (Cardiology) HMinus Breeding MD as Consulting Physician (Cardiology) PIrene Shipper MD as Consulting Physician (Gastroenterology) OKathie Rhodes MD (Inactive) as Consulting Physician (Urology)  Surgical: He  has a past surgical history that includes Coronary angioplasty with stent (04/2008); Colonoscopy w/ polypectomy; Mass excision (Left, 10/06/2017); Colonoscopy; IR Radiologist Eval & Mgmt (07/22/2021); and IR Radiologist Eval & Mgmt (10/20/2021). Family His family history includes Heart disease in his father; Hypertension  in his father; Hypotension in his mother; Kidney disease in his brother. Social history  He reports that he quit smoking about 29 years ago. His smoking use included cigarettes. He has never used smokeless tobacco. He reports that he does not drink alcohol and does not use drugs.    Darrol Jump, NP   12/25/2020

## 2022-07-28 ENCOUNTER — Other Ambulatory Visit: Payer: Self-pay | Admitting: Nurse Practitioner

## 2022-07-28 DIAGNOSIS — J9811 Atelectasis: Secondary | ICD-10-CM

## 2022-07-28 LAB — URINALYSIS, ROUTINE W REFLEX MICROSCOPIC
Bilirubin Urine: NEGATIVE
Glucose, UA: NEGATIVE
Ketones, ur: NEGATIVE
Leukocytes,Ua: NEGATIVE
Nitrite: NEGATIVE
RBC / HPF: NONE SEEN /HPF (ref 0–2)
Specific Gravity, Urine: 1.017 (ref 1.001–1.035)
Squamous Epithelial / HPF: NONE SEEN /HPF (ref <=5)
WBC, UA: NONE SEEN /HPF (ref 0–5)
Yeast: NONE SEEN /HPF
pH: 5.5 (ref 5.0–8.0)

## 2022-07-28 LAB — CBC WITH DIFFERENTIAL/PLATELET
Absolute Monocytes: 418 cells/uL (ref 200–950)
Basophils Absolute: 9 cells/uL (ref 0–200)
Basophils Relative: 0.2 %
Eosinophils Absolute: 108 cells/uL (ref 15–500)
Eosinophils Relative: 2.3 %
HCT: 46.5 % (ref 38.5–50.0)
Hemoglobin: 14.5 g/dL (ref 13.2–17.1)
Lymphs Abs: 1509 cells/uL (ref 850–3900)
MCH: 26.1 pg — ABNORMAL LOW (ref 27.0–33.0)
MCHC: 31.2 g/dL — ABNORMAL LOW (ref 32.0–36.0)
MCV: 83.6 fL (ref 80.0–100.0)
MPV: 10.8 fL (ref 7.5–12.5)
Monocytes Relative: 8.9 %
Neutro Abs: 2656 cells/uL (ref 1500–7800)
Neutrophils Relative %: 56.5 %
Platelets: 213 10*3/uL (ref 140–400)
RBC: 5.56 10*6/uL (ref 4.20–5.80)
RDW: 14.5 % (ref 11.0–15.0)
Total Lymphocyte: 32.1 %
WBC: 4.7 10*3/uL (ref 3.8–10.8)

## 2022-07-28 LAB — COMPLETE METABOLIC PANEL WITH GFR
AG Ratio: 1.7 (calc) (ref 1.0–2.5)
ALT: 17 U/L (ref 9–46)
AST: 20 U/L (ref 10–35)
Albumin: 4.5 g/dL (ref 3.6–5.1)
Alkaline phosphatase (APISO): 79 U/L (ref 35–144)
BUN/Creatinine Ratio: 8 (calc) (ref 6–22)
BUN: 13 mg/dL (ref 7–25)
CO2: 28 mmol/L (ref 20–32)
Calcium: 11.2 mg/dL — ABNORMAL HIGH (ref 8.6–10.3)
Chloride: 107 mmol/L (ref 98–110)
Creat: 1.69 mg/dL — ABNORMAL HIGH (ref 0.70–1.22)
Globulin: 2.6 g/dL (calc) (ref 1.9–3.7)
Glucose, Bld: 132 mg/dL — ABNORMAL HIGH (ref 65–99)
Potassium: 3.7 mmol/L (ref 3.5–5.3)
Sodium: 143 mmol/L (ref 135–146)
Total Bilirubin: 0.6 mg/dL (ref 0.2–1.2)
Total Protein: 7.1 g/dL (ref 6.1–8.1)
eGFR: 40 mL/min/{1.73_m2} — ABNORMAL LOW (ref >=60)

## 2022-07-28 LAB — LIPID PANEL
Cholesterol: 117 mg/dL (ref <200)
HDL: 44 mg/dL (ref >=40)
LDL Cholesterol (Calc): 59 mg/dL (calc)
Non-HDL Cholesterol (Calc): 73 mg/dL (calc) (ref <130)
Total CHOL/HDL Ratio: 2.7 (calc) (ref <5.0)
Triglycerides: 63 mg/dL (ref <150)

## 2022-07-28 LAB — MAGNESIUM: Magnesium: 2.5 mg/dL (ref 1.5–2.5)

## 2022-07-28 LAB — VITAMIN D 25 HYDROXY (VIT D DEFICIENCY, FRACTURES): Vit D, 25-Hydroxy: 60 ng/mL (ref 30–100)

## 2022-07-28 LAB — MICROSCOPIC MESSAGE

## 2022-07-28 LAB — HEMOGLOBIN A1C
Hgb A1c MFr Bld: 6 % of total Hgb — ABNORMAL HIGH (ref <5.7)
Mean Plasma Glucose: 126 mg/dL
eAG (mmol/L): 7 mmol/L

## 2022-07-28 MED ORDER — AZITHROMYCIN 250 MG PO TABS
ORAL_TABLET | ORAL | 1 refills | Status: DC
Start: 2022-07-28 — End: 2022-11-24

## 2022-07-29 ENCOUNTER — Other Ambulatory Visit: Payer: Self-pay | Admitting: Urology

## 2022-07-29 DIAGNOSIS — N281 Cyst of kidney, acquired: Secondary | ICD-10-CM

## 2022-07-29 NOTE — Progress Notes (Signed)
Virtual Visit via Telephone Note   Because of [name] Colon's co-morbid illnesses, he is at least at moderate risk for complications without adequate follow up.  This format is felt to be most appropriate for this patient at this time.  The patient did not have access to video technology/had technical difficulties with video requiring transitioning to audio format only (telephone).  All issues noted in this document were discussed and addressed.  No physical exam could be performed with this format.  Please refer to the patient's chart for his consent to telehealth for [facility].  Evaluation Performed:  Preoperative cardiovascular risk assessment _____________   Date:  07/29/2022   Patient ID:  [name] Colon, DOB 1941-03-23, MRN [id number] Patient Location:  Home Provider location:   Office  Primary Care Provider:  Unk Pinto, MD Primary Cardiologist:  Minus Breeding, MD  Chief Complaint / Patient Profile   81 y.o. y/o male with a h/o CAD s/p DES-LCx 2009, low risk myoview in 2017, HTN, HLD, and syncope which resolved off beta-blocker therapy who is pending right knee replacement and presents today for telephonic preoperative cardiovascular risk assessment.  Past Medical History    Past Medical History:  Diagnosis Date   Adenocarcinoma of prostate ([place]) 2004   s/p seed implantation   Arthritis    Axillary abscess    left   CAD (coronary artery disease)    Diverticulitis, colon    History of colonic polyps    HTN (hypertension)    Hyperglycemia    diet controlled   Hyperlipidemia    Radiation proctitis    with chronic bleeding   Past Surgical History:  Procedure Laterality Date   COLONOSCOPY     COLONOSCOPY W/ POLYPECTOMY     CORONARY ANGIOPLASTY WITH STENT PLACEMENT  04/2008   IR RADIOLOGIST EVAL & MGMT  07/22/2021   IR RADIOLOGIST EVAL & MGMT  10/20/2021   MASS EXCISION Left 10/06/2017   Procedure: EXCISION LEFT AXILLA CYST;  Surgeon:  Erroll Luna, MD;  Location: [facility];  Service: General;  Laterality: Left;    Allergies  No Known Allergies  History of Present Illness    [name] Colon is a 81 y.o. male who presents via audio/video conferencing for a telehealth visit today.  Pt was last seen in cardiology clinic on 07/13/21 by Dr. Percival Spanish.  At that time [name] Colon was doing well.  The patient is now pending procedure as outlined above. Since his last visit, he  denies chest pain, shortness of breath, lower extremity edema, fatigue, palpitations, melena, hematuria, hemoptysis, diaphoresis, weakness, presyncope, syncope, orthopnea, and PND.   Home Medications    Prior to Admission medications   Medication Sig Start Date End Date Taking? Authorizing Provider  allopurinol (ZYLOPRIM) 300 MG tablet Take 300 mg by mouth daily.    [provider]  amLODipine (NORVASC) 10 MG tablet Take 1 tablet (10 mg total) by mouth daily. 03/28/21   Lorella Nimrod, MD  aspirin 81 MG tablet Take 81 mg by mouth daily.    [provider]  azithromycin (ZITHROMAX) 250 MG tablet Take 2 tablets (500 mg) on Day 1 followed by 1 tablet  (250 mg) daily until complete. 07/28/22   Darrol Jump, NP  cetirizine (ZYRTEC) 10 MG tablet Take 10 mg by mouth daily in the afternoon.    [provider]  Cholecalciferol (VITAMIN D) 125 MCG (5000 UT) CAPS Take 5,000 Units by mouth daily. 07/10/20  Liane Comber, NP  escitalopram (LEXAPRO) 10 MG tablet Take 10 mg by mouth every evening. 03/06/21   [provider]  glucose blood (ONETOUCH VERIO) test strip 1 each by Other route daily in the afternoon. Use as instructed 03/29/22   Shamleffer, Melanie Crazier, MD  losartan (COZAAR) 100 MG tablet Take 100 mg by mouth daily. 03/06/21   [provider]  LUBRICATING PLUS EYE DROPS 0.5 % SOLN Apply 1 drop to eye 2 (two) times daily. 11/10/20   [provider]  nitroGLYCERIN (NITROSTAT) 0.4 MG SL  tablet Place 0.4 mg under the tongue every 5 (five) minutes as needed for chest pain.    [provider]  potassium chloride SA (KLOR-CON) 20 MEQ tablet Take    1 tablet      2 x /day      for Potassium Patient taking differently: Take 20 mEq by mouth 2 (two) times daily. 08/29/20   Unk Pinto, MD  propranolol (INDERAL) 10 MG tablet Take 10 mg by mouth as needed (anxiety).    [provider]  pyridOXINE (VITAMIN B-6) 50 MG tablet Take 50 mg by mouth daily.      [provider]  risperiDONE (RISPERDAL) 2 MG tablet Take 1 mg by mouth at bedtime.    [provider]  rosuvastatin (CRESTOR) 40 MG tablet Take  1 tablet   Daily  for Cholesterol 11/29/21   Unk Pinto, MD  tamsulosin (FLOMAX) 0.4 MG CAPS capsule Take 0.4 mg by mouth at bedtime.    [provider]  zolpidem (AMBIEN) 10 MG tablet Take 1 tablet (10 mg total) by mouth at bedtime. 12/25/20   Garnet Sierras, NP    Physical Exam    Vital Signs:  [name] Colon does not have vital signs available for review today.  Given telephonic nature of communication, physical exam is limited. AAOx3. NAD. Normal affect.  Speech and respirations are unlabored.  Accessory Clinical Findings    None  Assessment & Plan    1.  Preoperative Cardiovascular Risk Assessment: The patient is doing well from a cardiac perspective. Therefore, based on ACC/AHA guidelines, the patient would be at acceptable risk for the planned procedure without further cardiovascular testing. The patient was advised that if he develops new symptoms prior to surgery to contact our office to arrange for a follow-up visit, and he verbalized understanding. According to the Revised Cardiac Risk Index (RCRI), his Perioperative Risk of Major Cardiac Event is (%): 0.9. His Functional Capacity in METs is: 4.64 according to the [facility] Activity Status Index (DASI).  -Ideally would continue ASA. With spinal anesthesia, risk is high for  bleeding. Would be ok to hold x 7 days.  A copy of this note will be routed to requesting surgeon.  Time:   Today, I have spent 10 minutes with the patient with telehealth technology discussing medical history, symptoms, and management plan.     Emmaline Life, NP-C     07/29/2022, 10:44 AM [address] Office [phone] Fax [phone]

## 2022-07-30 ENCOUNTER — Encounter: Payer: Self-pay | Admitting: Nurse Practitioner

## 2022-07-30 ENCOUNTER — Ambulatory Visit: Payer: Medicare Other | Attending: Cardiovascular Disease | Admitting: Nurse Practitioner

## 2022-07-30 DIAGNOSIS — Z01810 Encounter for preprocedural cardiovascular examination: Secondary | ICD-10-CM

## 2022-08-03 ENCOUNTER — Ambulatory Visit: Payer: Medicare Other | Admitting: Nurse Practitioner

## 2022-08-03 ENCOUNTER — Ambulatory Visit
Admission: RE | Admit: 2022-08-03 | Discharge: 2022-08-03 | Disposition: A | Discharge status: Home / Self Care | Payer: Medicare Other | Source: Ambulatory Visit | Attending: Nurse Practitioner | Admitting: Nurse Practitioner

## 2022-08-03 DIAGNOSIS — J9811 Atelectasis: Secondary | ICD-10-CM

## 2022-08-04 ENCOUNTER — Encounter: Payer: Self-pay | Admitting: Nurse Practitioner

## 2022-08-04 ENCOUNTER — Ambulatory Visit (INDEPENDENT_AMBULATORY_CARE_PROVIDER_SITE_OTHER): Payer: Medicare Other | Admitting: Nurse Practitioner

## 2022-08-04 VITALS — BP 118/68 | HR 54 | Temp 97.3°F | Ht 69.0 in | Wt 189.0 lb

## 2022-08-04 DIAGNOSIS — J9811 Atelectasis: Secondary | ICD-10-CM | POA: Diagnosis not present

## 2022-08-04 DIAGNOSIS — I2510 Atherosclerotic heart disease of native coronary artery without angina pectoris: Secondary | ICD-10-CM | POA: Diagnosis not present

## 2022-08-04 DIAGNOSIS — Z01818 Encounter for other preprocedural examination: Secondary | ICD-10-CM | POA: Diagnosis not present

## 2022-08-04 NOTE — Progress Notes (Signed)
Assessment and Plan:  [name] Colon was seen today for a follow up.  Diagnoses and all order for this visit:  1. Atelectasis of right lung Completed x1 course of abx in full. Updated CXR reveals no change to right lung base. Clinical exam lungs are CTA. Patient asymptomatic. Discussed importance of deep breathing exercises. If patient has increase in respiratory distress/difficulty breathing we will follow up with CT Scan post surgery to assess for any underlying disease process.   2. Pre-op evaluation Cleared for surgery  of Right TKNEE arthroplasty   Continue to monitor for any increase in fever, chills, N/V, diarrhea, changes to bowel habits, blood in stool.  Notify office for further evaluation and treatment, questions or concerns if s/s fail to improve. The risks and benefits of my recommendations, as well as other treatment options were discussed with the patient today. Questions were answered.  Further disposition pending results of labs. Discussed med's effects and SE's.    Over 15 minutes of exam, counseling, chart review, and critical decision making was performed.   Future Appointments  Date Time Provider [place]  09/27/2022 11:50 AM GI-315 MR 1 [id number] [facility] [facility]  09/30/2022  7:50 AM Shamleffer, Melanie Crazier, MD [facility] None  11/19/2022  9:30 AM Unk Pinto, MD [facility] None  02/21/2023  9:30 AM Alycia Rossetti, NP [facility] None  05/23/2023 10:00 AM Unk Pinto, MD [facility] None    ------------------------------------------------------------------------------------------------------------------   HPI BP 118/68   Pulse (!) 54   Temp (!) 97.3 F (36.3 C)   Ht '5\' 9"'$  (1.753 m)   Wt 189 lb (85.7 kg)   SpO2 91%   BMI 27.91 kg/m   80 y.o.male presents for evaluation of right lower lung atelectasis.  He was treated with a short dose of Azithromycin and has now completed an updated CXR that shows no change to right lower  lung.  He is not having any difficulty breathing in clinic.  Denies SOB, DOE, fatigue, any recent URI.   Past Medical History:  Diagnosis Date   Adenocarcinoma of prostate ([place]) 2004   s/p seed implantation   Arthritis    Axillary abscess    left   CAD (coronary artery disease)    Diverticulitis, colon    History of colonic polyps    HTN (hypertension)    Hyperglycemia    diet controlled   Hyperlipidemia    Radiation proctitis    with chronic bleeding     No Known Allergies  Current Outpatient Medications on File Prior to Visit  Medication Sig   allopurinol (ZYLOPRIM) 300 MG tablet Take 300 mg by mouth daily.   amLODipine (NORVASC) 10 MG tablet Take 1 tablet (10 mg total) by mouth daily.   aspirin 81 MG tablet Take 81 mg by mouth daily.   cetirizine (ZYRTEC) 10 MG tablet Take 10 mg by mouth daily in the afternoon.   Cholecalciferol (VITAMIN D) 125 MCG (5000 UT) CAPS Take 5,000 Units by mouth daily.   escitalopram (LEXAPRO) 10 MG tablet Take 10 mg by mouth every evening.   glucose blood (ONETOUCH VERIO) test strip 1 each by Other route daily in the afternoon. Use as instructed   losartan (COZAAR) 100 MG tablet Take 100 mg by mouth daily.   LUBRICATING PLUS EYE DROPS 0.5 % SOLN Apply 1 drop to eye 2 (two) times daily.   nitroGLYCERIN (NITROSTAT) 0.4 MG SL tablet Place 0.4 mg under the tongue every 5 (five) minutes as needed for  chest pain.   potassium chloride SA (KLOR-CON) 20 MEQ tablet Take    1 tablet      2 x /day      for Potassium (Patient taking differently: Take 20 mEq by mouth 2 (two) times daily.)   propranolol (INDERAL) 10 MG tablet Take 10 mg by mouth as needed (anxiety).   pyridOXINE (VITAMIN B-6) 50 MG tablet Take 50 mg by mouth daily.     risperiDONE (RISPERDAL) 2 MG tablet Take 1 mg by mouth at bedtime.   rosuvastatin (CRESTOR) 40 MG tablet Take  1 tablet   Daily  for Cholesterol   tamsulosin (FLOMAX) 0.4 MG CAPS capsule Take 0.4 mg by mouth at bedtime.    zolpidem (AMBIEN) 10 MG tablet Take 1 tablet (10 mg total) by mouth at bedtime.   azithromycin (ZITHROMAX) 250 MG tablet Take 2 tablets (500 mg) on Day 1 followed by 1 tablet  (250 mg) daily until complete.   No current facility-administered medications on file prior to visit.    ROS: all negative except what is noted in the HPI.   Physical Exam:  BP 118/68   Pulse (!) 54   Temp (!) 97.3 F (36.3 C)   Ht '5\' 9"'$  (1.753 m)   Wt 189 lb (85.7 kg)   SpO2 91%   BMI 27.91 kg/m   General Appearance: NAD.  Awake, conversant and cooperative. Eyes: PERRLA, EOMs intact.  Sclera white.  Conjunctiva without erythema. Sinuses: No frontal/maxillary tenderness.  No nasal discharge. Nares patent.  ENT/Mouth: Ext aud canals clear.  Bilateral TMs w/DOL and without erythema or bulging. Hearing intact.  Posterior pharynx without swelling or exudate.  Tonsils without swelling or erythema.  Neck: Supple.  No masses, nodules or thyromegaly. Respiratory: Effort is regular with non-labored breathing. Breath sounds are equal bilaterally without rales, rhonchi, wheezing or stridor.  Cardio: RRR with no MRGs. Brisk peripheral pulses without edema.  Abdomen: Active BS in all four quadrants.  Soft and non-tender without guarding, rebound tenderness, hernias or masses. Lymphatics: Non tender without lymphadenopathy.  Musculoskeletal: Full ROM, 5/5 strength, normal ambulation.  No clubbing or cyanosis. Skin: Appropriate color for ethnicity. Warm without rashes, lesions, ecchymosis, ulcers.  Neuro: CN II-XII grossly normal. Normal muscle tone without cerebellar symptoms and intact sensation.   Psych: AO X 3,  appropriate mood and affect, insight and judgment.     Darrol Jump, NP 1:58 PM [facility]

## 2022-08-12 ENCOUNTER — Telehealth: Payer: Self-pay

## 2022-08-12 NOTE — Patient Outreach (Signed)
  Care Coordination   08/12/2022 Name: [name] Colon MRN: [id number] DOB: 07-06-1941   Care Coordination Outreach Attempts:  An unsuccessful telephone outreach was attempted today to offer the patient information about available care coordination services as a benefit of their health plan.   Follow Up Plan:  Additional outreach attempts will be made to offer the patient care coordination information and services.   Encounter Outcome:  No Answer  Care Coordination Interventions Activated:  No   Care Coordination Interventions:  No, not indicated    Jone Baseman, RN, MSN [facility] Care Management Care Management Coordinator Direct Line [phone]

## 2022-08-18 ENCOUNTER — Other Ambulatory Visit: Payer: Self-pay | Admitting: Orthopedic Surgery

## 2022-08-20 ENCOUNTER — Ambulatory Visit: Payer: Medicare Other | Admitting: Nurse Practitioner

## 2022-08-24 ENCOUNTER — Telehealth: Payer: Self-pay

## 2022-08-24 NOTE — Patient Outreach (Signed)
  Care Coordination   08/24/2022 Name: [name] Colon MRN: [id number] DOB: 1940/12/08   Care Coordination Outreach Attempts:  A second unsuccessful outreach was attempted today to offer the patient with information about available care coordination services as a benefit of their health plan.     Follow Up Plan:  Additional outreach attempts will be made to offer the patient care coordination information and services.   Encounter Outcome:  No Answer  Care Coordination Interventions Activated:  No   Care Coordination Interventions:  No, not indicated    Jone Baseman, RN, MSN [facility] Care Management Care Management Coordinator Direct Line [phone]

## 2022-08-26 NOTE — Care Plan (Signed)
Ortho Bundle Case Management Note  Patient Details  Name: [name] Colon MRN: [id number] Date of Birth: Mar 15, 1941   Spoke with patient prior to surgery. Will discharge to home with family to assist. Rolling walker ordered. HHPT referral to [place] home care and OPPT set up with [place]. Discharge instructions discussed and mailed to patient. Patient and MD in agreement with plan. Choice offered                 DME Arranged:  Walker rolling DME Agency:  [employer]  HH Arranged:  PT [place] Agency:     Additional Comments: Please contact me with any questions of if this plan should need to change.  Ladell Heads,  [facility]  [phone] 08/26/2022, 12:06 PM

## 2022-08-27 NOTE — Patient Instructions (Signed)
DUE TO COVID-19 ONLY TWO VISITORS  (aged 82 and older)  ARE ALLOWED TO COME WITH YOU AND STAY IN THE WAITING ROOM ONLY DURING PRE OP AND PROCEDURE.   **NO VISITORS ARE ALLOWED IN THE SHORT STAY AREA OR RECOVERY ROOM!!**  IF YOU WILL BE ADMITTED INTO THE HOSPITAL YOU ARE ALLOWED ONLY FOUR SUPPORT PEOPLE DURING VISITATION HOURS ONLY (7 AM -8PM)   The support person(s) must pass our screening, gel in and out, and wear a mask at all times, including in the patient's room. Patients must also wear a mask when staff or their support person are in the room. Visitors GUEST BADGE MUST BE WORN VISIBLY  One adult visitor may remain with you overnight and MUST be in the room by 8 P.M.     Your procedure is scheduled on: 09/06/22   Report to [facility] Main Entrance    Report to admitting at : 10:00 AM   Call this number if you have problems the morning of surgery [phone]   Do not eat food :After Midnight.   After Midnight you may have the following liquids until : 9:30 AM DAY OF SURGERY  Water Black Coffee (sugar ok, NO MILK/CREAM OR CREAMERS)  Tea (sugar ok, NO MILK/CREAM OR CREAMERS) regular and decaf                             Plain Jell-O (NO RED)                                           Fruit ices (not with fruit pulp, NO RED)                                     Popsicles (NO RED)                                                                  Juice: apple, WHITE grape, WHITE cranberry Sports drinks like Gatorade (NO RED)              Drink G2 drink AT : 9:30 AM the morning of surgery.      The day of surgery:  Drink ONE (1) Pre-Surgery Clear Ensure or G2 at AM the morning of surgery. Drink in one sitting. Do not sip.  This drink was given to you during your hospital  pre-op appointment visit. Nothing else to drink after completing the  Pre-Surgery Clear Ensure or G2.          If you have questions, please contact your surgeon's office.    Oral Hygiene is also  important to reduce your risk of infection.                                    Remember - BRUSH YOUR TEETH THE MORNING OF SURGERY WITH YOUR REGULAR TOOTHPASTE   Do NOT smoke after Midnight   Take these medicines the morning of surgery with  A SIP OF WATER: propranolol,allopurinol.Use eye drops as usual.  DO NOT TAKE ANY ORAL DIABETIC MEDICATIONS DAY OF YOUR SURGERY                    You may not have any metal on your body including hair pins, jewelry, and body piercing             Do not wear lotions, powders, perfumes/cologne, or deodorant              Men may shave face and neck.   Do not bring valuables to the hospital. [place].   Contacts, dentures or bridgework may not be worn into surgery.   Bring small overnight bag day of surgery.   DO NOT [place]. PHARMACY WILL DISPENSE MEDICATIONS LISTED ON YOUR MEDICATION LIST TO YOU DURING YOUR ADMISSION [place]!    Patients discharged on the day of surgery will not be allowed to drive home.  Someone NEEDS to stay with you for the first 24 hours after anesthesia.   Special Instructions: Bring a copy of your healthcare power of attorney and living will documents         the day of surgery if you haven't scanned them before.              Please read over the following fact sheets you were given: IF YOU HAVE QUESTIONS ABOUT YOUR PRE-OP INSTRUCTIONS PLEASE CALL [phone]     [facility] - Preparing for Surgery Before surgery, you can play an important role.  Because skin is not sterile, your skin needs to be as free of germs as possible.  You can reduce the number of germs on your skin by washing with CHG (chlorahexidine gluconate) soap before surgery.  CHG is an antiseptic cleaner which kills germs and bonds with the skin to continue killing germs even after washing. Please DO NOT use if you have an allergy to CHG or antibacterial soaps.  If  your skin becomes reddened/irritated stop using the CHG and inform your nurse when you arrive at Short Stay. Do not shave (including legs and underarms) for at least 48 hours prior to the first CHG shower.  You may shave your face/neck. Please follow these instructions carefully:  1.  Shower with CHG Soap the night before surgery and the  morning of Surgery.  2.  If you choose to wash your hair, wash your hair first as usual with your  normal  shampoo.  3.  After you shampoo, rinse your hair and body thoroughly to remove the  shampoo.                           4.  Use CHG as you would any other liquid soap.  You can apply chg directly  to the skin and wash                       Gently with a scrungie or clean washcloth.  5.  Apply the CHG Soap to your body ONLY FROM THE NECK DOWN.   Do not use on face/ open                           Wound or open  sores. Avoid contact with eyes, ears mouth and genitals (private parts).                       Wash face,  Genitals (private parts) with your normal soap.             6.  Wash thoroughly, paying special attention to the area where your surgery  will be performed.  7.  Thoroughly rinse your body with warm water from the neck down.  8.  DO NOT shower/wash with your normal soap after using and rinsing off  the CHG Soap.                9.  Pat yourself dry with a clean towel.            10.  Wear clean pajamas.            11.  Place clean sheets on your bed the night of your first shower and do not  sleep with pets. Day of Surgery : Do not apply any lotions/deodorants the morning of surgery.  Please wear clean clothes to the hospital/surgery center.  FAILURE TO FOLLOW THESE INSTRUCTIONS MAY RESULT IN THE CANCELLATION OF YOUR SURGERY PATIENT SIGNATURE_________________________________  NURSE SIGNATURE__________________________________  ________________________________________________________________________   [name] Colon  An incentive  spirometer is a tool that can help keep your lungs clear and active. This tool measures how well you are filling your lungs with each breath. Taking long deep breaths may help reverse or decrease the chance of developing breathing (pulmonary) problems (especially infection) following: A long period of time when you are unable to move or be active. BEFORE THE PROCEDURE  If the spirometer includes an indicator to show your best effort, your nurse or respiratory therapist will set it to a desired goal. If possible, sit up straight or lean slightly forward. Try not to slouch. Hold the incentive spirometer in an upright position. INSTRUCTIONS FOR USE  Sit on the edge of your bed if possible, or sit up as far as you can in bed or on a chair. Hold the incentive spirometer in an upright position. Breathe out normally. Place the mouthpiece in your mouth and seal your lips tightly around it. Breathe in slowly and as deeply as possible, raising the piston or the ball toward the top of the column. Hold your breath for 3-5 seconds or for as long as possible. Allow the piston or ball to fall to the bottom of the column. Remove the mouthpiece from your mouth and breathe out normally. Rest for a few seconds and repeat Steps 1 through 7 at least 10 times every 1-2 hours when you are awake. Take your time and take a few normal breaths between deep breaths. The spirometer may include an indicator to show your best effort. Use the indicator as a goal to work toward during each repetition. After each set of 10 deep breaths, practice coughing to be sure your lungs are clear. If you have an incision (the cut made at the time of surgery), support your incision when coughing by placing a pillow or rolled up towels firmly against it. Once you are able to get out of bed, walk around indoors and cough well. You may stop using the incentive spirometer when instructed by your caregiver.  RISKS AND COMPLICATIONS Take your time  so you do not get dizzy or light-headed. If you are in pain, you may need to take or ask for pain  medication before doing incentive spirometry. It is harder to take a deep breath if you are having pain. AFTER USE Rest and breathe slowly and easily. It can be helpful to keep track of a log of your progress. Your caregiver can provide you with a simple table to help with this. If you are using the spirometer at home, follow these instructions: [place] IF:  You are having difficultly using the spirometer. You have trouble using the spirometer as often as instructed. Your pain medication is not giving enough relief while using the spirometer. You develop fever of 100.5 F (38.1 C) or higher. SEEK IMMEDIATE MEDICAL CARE IF:  You cough up bloody sputum that had not been present before. You develop fever of 102 F (38.9 C) or greater. You develop worsening pain at or near the incision site. MAKE SURE YOU:  Understand these instructions. Will watch your condition. Will get help right away if you are not doing well or get worse. Document Released: 03/21/2007 Document Revised: 01/31/2012 Document Reviewed: 05/22/2007 [facility] Patient Information 2014 [place], [place].   ________________________________________________________________________

## 2022-08-31 ENCOUNTER — Encounter (HOSPITAL_COMMUNITY): Payer: Self-pay

## 2022-08-31 ENCOUNTER — Other Ambulatory Visit: Payer: Self-pay

## 2022-08-31 ENCOUNTER — Encounter (HOSPITAL_COMMUNITY)
Admission: RE | Admit: 2022-08-31 | Discharge: 2022-08-31 | Disposition: A | Discharge status: Home / Self Care | Payer: Medicare Other | Source: Ambulatory Visit | Attending: Orthopedic Surgery | Admitting: Orthopedic Surgery

## 2022-08-31 VITALS — BP 135/81 | HR 66 | Temp 97.7°F | Ht 69.0 in | Wt 187.0 lb

## 2022-08-31 DIAGNOSIS — Z01818 Encounter for other preprocedural examination: Secondary | ICD-10-CM

## 2022-08-31 DIAGNOSIS — Z8546 Personal history of malignant neoplasm of prostate: Secondary | ICD-10-CM | POA: Diagnosis not present

## 2022-08-31 DIAGNOSIS — I129 Hypertensive chronic kidney disease with stage 1 through stage 4 chronic kidney disease, or unspecified chronic kidney disease: Secondary | ICD-10-CM | POA: Diagnosis not present

## 2022-08-31 DIAGNOSIS — E1122 Type 2 diabetes mellitus with diabetic chronic kidney disease: Secondary | ICD-10-CM | POA: Diagnosis not present

## 2022-08-31 DIAGNOSIS — Z87891 Personal history of nicotine dependence: Secondary | ICD-10-CM | POA: Insufficient documentation

## 2022-08-31 DIAGNOSIS — I2510 Atherosclerotic heart disease of native coronary artery without angina pectoris: Secondary | ICD-10-CM | POA: Diagnosis not present

## 2022-08-31 DIAGNOSIS — N1832 Chronic kidney disease, stage 3b: Secondary | ICD-10-CM | POA: Diagnosis not present

## 2022-08-31 DIAGNOSIS — Z955 Presence of coronary angioplasty implant and graft: Secondary | ICD-10-CM | POA: Insufficient documentation

## 2022-08-31 DIAGNOSIS — M1711 Unilateral primary osteoarthritis, right knee: Secondary | ICD-10-CM | POA: Insufficient documentation

## 2022-08-31 DIAGNOSIS — Z01812 Encounter for preprocedural laboratory examination: Secondary | ICD-10-CM | POA: Insufficient documentation

## 2022-08-31 DIAGNOSIS — I10 Essential (primary) hypertension: Secondary | ICD-10-CM

## 2022-08-31 DIAGNOSIS — I351 Nonrheumatic aortic (valve) insufficiency: Secondary | ICD-10-CM | POA: Diagnosis not present

## 2022-08-31 HISTORY — DX: Prediabetes: R73.03

## 2022-08-31 LAB — BASIC METABOLIC PANEL
Anion gap: 8 (ref 5–15)
BUN: 14 mg/dL (ref 8–23)
CO2: 24 mmol/L (ref 22–32)
Calcium: 11.1 mg/dL — ABNORMAL HIGH (ref 8.9–10.3)
Chloride: 111 mmol/L (ref 98–111)
Creatinine, Ser: 1.83 mg/dL — ABNORMAL HIGH (ref 0.61–1.24)
GFR, Estimated: 37 mL/min — ABNORMAL LOW (ref >60)
Glucose, Bld: 102 mg/dL — ABNORMAL HIGH (ref 70–99)
Potassium: 4.2 mmol/L (ref 3.5–5.1)
Sodium: 143 mmol/L (ref 135–145)

## 2022-08-31 LAB — CBC
HCT: 47.1 % (ref 39.0–52.0)
Hemoglobin: 14.6 g/dL (ref 13.0–17.0)
MCH: 26.1 pg (ref 26.0–34.0)
MCHC: 31 g/dL (ref 30.0–36.0)
MCV: 84.1 fL (ref 80.0–100.0)
Platelets: 167 10*3/uL (ref 150–400)
RBC: 5.6 MIL/uL (ref 4.22–5.81)
RDW: 16.3 % — ABNORMAL HIGH (ref 11.5–15.5)
WBC: 5.3 10*3/uL (ref 4.0–10.5)
nRBC: 0 % (ref 0.0–0.2)

## 2022-08-31 LAB — SURGICAL PCR SCREEN
MRSA, PCR: NEGATIVE
Staphylococcus aureus: NEGATIVE

## 2022-08-31 LAB — GLUCOSE, CAPILLARY: Glucose-Capillary: 103 mg/dL — ABNORMAL HIGH (ref 70–99)

## 2022-08-31 NOTE — Progress Notes (Signed)
For Short Stay: [place] appointment date: Date of COVID positive in last 51 days:  Bowel Prep reminder:   For Anesthesia: PCP - Dr. Unk Pinto Cardiologist - Dr. Minus Breeding Clearance: Christen Bame: NP: 07/30/22: EPIC Chest x-ray - 08/04/22 EKG - 07/27/22 Stress Test -  ECHO - 03/26/21 Cardiac Cath - 04/2008 Pacemaker/ICD device last checked: Pacemaker orders received: Device Rep notified:  Spinal Cord Stimulator:  Sleep Study -  CPAP -   Fasting Blood Sugar - N/A Checks Blood Sugar __0___ times a day Date and result of last Hgb A1c- 6.0: 07/27/22  Blood Thinner Instructions: Aspirin Instructions: Last Dose:  Activity level: Can go up a flight of stairs and activities of daily living without stopping and without chest pain and/or shortness of breath   Able to exercise without chest pain and/or shortness of breath   Unable to go up a flight of stairs without chest pain and/or shortness of breath     Anesthesia review: Hx: HTN,CAD,DIA,CKD III  Patient denies shortness of breath, fever, cough and chest pain at PAT appointment   Patient verbalized understanding of instructions that were given to them at the PAT appointment. Patient was also instructed that they will need to review over the PAT instructions again at home before surgery.

## 2022-09-01 DIAGNOSIS — M1711 Unilateral primary osteoarthritis, right knee: Secondary | ICD-10-CM | POA: Diagnosis present

## 2022-09-01 NOTE — Progress Notes (Signed)
Anesthesia Chart Review   Case: [id number] Date/Time: 09/06/22 1217   Procedure: RIGHT TOTAL KNEE ARTHROPLASTY (Right: Knee)   Anesthesia type: Spinal   Pre-op diagnosis: RIGHT KNEE OSTEOARTHRITIS   Location: [place] 08 / [facility]   Surgeons: Frederik Pear, MD       DISCUSSION:81 y.o. former smoker with h/o HTN, CAD s/p DES-LCx 2009, low risk myoview in 2017, CKD Stage III, prostate cancer, right knee OA scheduled for above procedure 09/06/2022 with Dr. Frederik Pear.   Pt seen by cardiology 07/30/2022. Per OV note, " Preoperative Cardiovascular Risk Assessment: The patient is doing well from a cardiac perspective. Therefore, based on ACC/AHA guidelines, the patient would be at acceptable risk for the planned procedure without further cardiovascular testing. The patient was advised that if he develops new symptoms prior to surgery to contact our office to arrange for a follow-up visit, and he verbalized understanding. According to the Revised Cardiac Risk Index (RCRI), his Perioperative Risk of Major Cardiac Event is (%): 0.9. His Functional Capacity in METs is: 4.64 according to the Duke Activity Status Index (DASI).   -Ideally would continue ASA. With spinal anesthesia, risk is high for bleeding. Would be ok to hold x 7 days"  Cleared by PCP.   Anticipate pt can proceed with planned procedure barring acute status change.   VS: BP 135/81   Pulse 66   Temp 36.5 C (Oral)   Ht '5\' 9"'$  (1.753 m)   Wt 84.8 kg   SpO2 100%   BMI 27.62 kg/m   PROVIDERS: Unk Pinto, MD is PCP   Cardiologist - Dr. Minus Breeding LABS: Labs reviewed: Acceptable for surgery. (all labs ordered are listed, but only abnormal results are displayed)  Labs Reviewed  BASIC METABOLIC PANEL - Abnormal; Notable for the following components:      Result Value   Glucose, Bld 102 (*)    Creatinine, Ser 1.83 (*)    Calcium 11.1 (*)    GFR, Estimated 37 (*)    All other components within normal limits  CBC -  Abnormal; Notable for the following components:   RDW 16.3 (*)    All other components within normal limits  GLUCOSE, CAPILLARY - Abnormal; Notable for the following components:   Glucose-Capillary 103 (*)    All other components within normal limits  SURGICAL PCR SCREEN     IMAGES:   EKG:   CV: Echo 03/26/2021 1. Left ventricular ejection fraction, by estimation, is 55 to 60%. The  left ventricle has normal function. The left ventricle has no regional  wall motion abnormalities. Left ventricular diastolic parameters are  consistent with Grade I diastolic  dysfunction (impaired relaxation).   2. Right ventricular systolic function is normal. The right ventricular  size is normal.   3. The mitral valve is normal in structure. Trivial mitral valve  regurgitation. No evidence of mitral stenosis.   4. The aortic valve is tricuspid. Aortic valve regurgitation is mild and  eccentric. No aortic stenosis is present.   5. The inferior vena cava is normal in size with greater than 50%  respiratory variability, suggesting right atrial pressure of 3 mmHg.  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   Arthritis    Axillary abscess    left   CAD (coronary artery disease)    Diverticulitis, colon    History of colonic polyps    HTN (hypertension)    Hyperglycemia  diet controlled   Hyperlipidemia    Pre-diabetes    Radiation proctitis    with chronic bleeding    Past Surgical History:  Procedure Laterality Date   COLONOSCOPY     COLONOSCOPY W/ POLYPECTOMY     CORONARY ANGIOPLASTY WITH STENT PLACEMENT  04/2008   IR RADIOLOGIST EVAL & MGMT  07/22/2021   IR RADIOLOGIST EVAL & MGMT  10/20/2021   MASS EXCISION Left 10/06/2017   Procedure: EXCISION LEFT AXILLA CYST;  Surgeon: Erroll Luna, MD;  Location: [place];  Service: General;  Laterality: Left;    MEDICATIONS:  allopurinol (ZYLOPRIM) 300 MG  tablet   amLODipine (NORVASC) 10 MG tablet   amLODipine (NORVASC) 5 MG tablet   Ascorbic Acid (VITAMIN C) 1000 MG tablet   aspirin 81 MG tablet   azithromycin (ZITHROMAX) 250 MG tablet   cetirizine (ZYRTEC) 10 MG tablet   Cholecalciferol (VITAMIN D) 125 MCG (5000 UT) CAPS   diphenhydramine-acetaminophen (TYLENOL PM) 25-500 MG TABS tablet   escitalopram (LEXAPRO) 10 MG tablet   glucose blood (ONETOUCH VERIO) test strip   losartan (COZAAR) 100 MG tablet   LUBRICATING PLUS EYE DROPS 0.5 % SOLN   nitroGLYCERIN (NITROSTAT) 0.4 MG SL tablet   potassium chloride SA (KLOR-CON) 20 MEQ tablet   propranolol (INDERAL) 10 MG tablet   pyridOXINE (VITAMIN B-6) 50 MG tablet   risperiDONE (RISPERDAL) 2 MG tablet   rosuvastatin (CRESTOR) 40 MG tablet   tamsulosin (FLOMAX) 0.4 MG CAPS capsule   zolpidem (AMBIEN) 10 MG tablet   No current facility-administered medications for this encounter.    Konrad Felix Ward, PA-C [facility] Pre-Surgical Testing [phone]

## 2022-09-01 NOTE — Anesthesia Preprocedure Evaluation (Addendum)
Anesthesia Evaluation  Patient identified by MRN, date of birth, ID band Patient awake    Reviewed: Allergy & Precautions, NPO status , Patient's Chart, lab work & pertinent test results, reviewed documented beta blocker date and time   Airway Mallampati: III  TM Distance: >3 FB Neck ROM: Full    Dental  (+) Edentulous Upper   Pulmonary former smoker,  Quit smoking 1994   Pulmonary exam normal breath sounds clear to auscultation       Cardiovascular hypertension, Pt. on medications and Pt. on home beta blockers + CAD and + Cardiac Stents (2009)  Normal cardiovascular exam+ Valvular Problems/Murmurs (mild AI) AI  Rhythm:Regular Rate:Normal  Echo 2022 1. Left ventricular ejection fraction, by estimation, is 55 to 60%. The  left ventricle has normal function. The left ventricle has no regional  wall motion abnormalities. Left ventricular diastolic parameters are  consistent with Grade I diastolic  dysfunction (impaired relaxation).  2. Right ventricular systolic function is normal. The right ventricular  size is normal.  3. The mitral valve is normal in structure. Trivial mitral valve  regurgitation. No evidence of mitral stenosis.  4. The aortic valve is tricuspid. Aortic valve regurgitation is mild and eccentric. No aortic stenosis is present.  5. The inferior vena cava is normal in size with greater than 50%  respiratory variability, suggesting right atrial pressure of 3 mmHg.   2017 neg stress test    Neuro/Psych PSYCHIATRIC DISORDERS Anxiety negative neurological ROS     GI/Hepatic negative GI ROS, Neg liver ROS,   Endo/Other  diabetes (prediabetic)  Renal/GU Renal Insufficiency and CRFRenal diseaseCr 1.83  negative genitourinary   Musculoskeletal  (+) Arthritis , Osteoarthritis,    Abdominal   Peds  Hematology negative hematology ROS (+) Hb 14.6, plt 167   Anesthesia Other Findings    Reproductive/Obstetrics negative OB ROS                           Anesthesia Physical Anesthesia Plan  ASA: 3  Anesthesia Plan: Spinal, Regional and MAC   Post-op Pain Management: Regional block* and Tylenol PO (pre-op)*   Induction:   PONV Risk Score and Plan: 2 and Propofol infusion and TIVA  Airway Management Planned: Natural Airway and Nasal Cannula  Additional Equipment: None  Intra-op Plan:   Post-operative Plan:   Informed Consent: I have reviewed the patients History and Physical, chart, labs and discussed the procedure including the risks, benefits and alternatives for the proposed anesthesia with the patient or authorized representative who has indicated his/her understanding and acceptance.     Dental advisory given  Plan Discussed with: CRNA  Anesthesia Plan Comments:       Anesthesia Quick Evaluation

## 2022-09-01 NOTE — H&P (Signed)
TOTAL KNEE ADMISSION H&P  Patient is being admitted for right total knee arthroplasty.  Subjective:  Chief Complaint:right knee pain.  HPI: [name] Colon, 81 y.o. male, has a history of pain and functional disability in the right knee due to arthritis and has failed non-surgical conservative treatments for greater than 12 weeks to includeNSAID's and/or analgesics, corticosteriod injections, flexibility and strengthening excercises, use of assistive devices, weight reduction as appropriate, and activity modification.  Onset of symptoms was gradual, starting  several  years ago with gradually worsening course since that time. The patient noted no past surgery on the right knee(s).  Patient currently rates pain in the right knee(s) at 10 out of 10 with activity. Patient has night pain, worsening of pain with activity and weight bearing, pain that interferes with activities of daily living, pain with passive range of motion, crepitus, and joint swelling.  Patient has evidence of joint space narrowing by imaging studies.  There is no active infection.  Patient Active Problem List   Diagnosis Date Noted   Osteoarthritis of right knee 09/01/2022   Hyperparathyroidism ([place]) 03/29/2022   Type 2 diabetes mellitus with stage 3b chronic kidney disease, without long-term current use of insulin ([place]) 03/29/2022   Hyperparathyroidism due to renal insufficiency ([place]) 05/14/2021   Syncope and collapse 03/25/2021   Sinus bradycardia 03/25/2021   Hypercalcemia 07/04/2020   Microalbuminuria due to type 2 diabetes mellitus ([place]) 06/02/2020   Murmur 11/07/2019   Insomnia 10/09/2018   CKD (chronic kidney disease), stage III ([place]) 03/29/2018   Gout 04/12/2016   PTSD (post-traumatic stress disorder) 09/26/2015   Obesity (BMI 30.0-34.9) 09/26/2015   Vitamin D deficiency 03/18/2014   Diet-controlled diabetes mellitus ([place]) 03/17/2014   Medication management 03/17/2014   RADIATION PROCTITIS 09/03/2008    ADENOCARCINOMA, PROSTATE 08/29/2008   COLONIC POLYPS 08/29/2008   Hyperlipidemia associated with type 2 diabetes mellitus ([place]) 08/29/2008   Essential hypertension 08/29/2008   Coronary atherosclerosis 08/29/2008   Diverticulosis of colon 08/29/2008   Past Medical History:  Diagnosis Date   Adenocarcinoma of prostate ([place]) 2004   s/p seed implantation   Arthritis    Axillary abscess    left   CAD (coronary artery disease)    Diverticulitis, colon    History of colonic polyps    HTN (hypertension)    Hyperglycemia    diet controlled   Hyperlipidemia    Pre-diabetes    Radiation proctitis    with chronic bleeding    Past Surgical History:  Procedure Laterality Date   COLONOSCOPY     COLONOSCOPY W/ POLYPECTOMY     CORONARY ANGIOPLASTY WITH STENT PLACEMENT  04/2008   IR RADIOLOGIST EVAL & MGMT  07/22/2021   IR RADIOLOGIST EVAL & MGMT  10/20/2021   MASS EXCISION Left 10/06/2017   Procedure: EXCISION LEFT AXILLA CYST;  Surgeon: Erroll Luna, MD;  Location: [facility];  Service: General;  Laterality: Left;    No current facility-administered medications for this encounter.   Current Outpatient Medications  Medication Sig Dispense Refill Last Dose   allopurinol (ZYLOPRIM) 300 MG tablet Take 300 mg by mouth daily.      amLODipine (NORVASC) 5 MG tablet Take 5 mg by mouth daily.      Ascorbic Acid (VITAMIN C) 1000 MG tablet Take 1,000 mg by mouth daily.      aspirin 81 MG tablet Take 81 mg by mouth daily.      cetirizine (ZYRTEC) 10 MG tablet Take 10 mg by  mouth daily in the afternoon.      diphenhydramine-acetaminophen (TYLENOL PM) 25-500 MG TABS tablet Take 2 tablets by mouth at bedtime as needed (sleep/pain).      escitalopram (LEXAPRO) 10 MG tablet Take 10 mg by mouth every evening.      losartan (COZAAR) 100 MG tablet Take 100 mg by mouth daily.      LUBRICATING PLUS EYE DROPS 0.5 % SOLN Apply 1 drop to eye 2 (two) times daily as needed (dry eyes).       nitroGLYCERIN (NITROSTAT) 0.4 MG SL tablet Place 0.4 mg under the tongue every 5 (five) minutes as needed for chest pain.      potassium chloride SA (KLOR-CON) 20 MEQ tablet Take    1 tablet      2 x /day      for Potassium (Patient taking differently: Take 20 mEq by mouth 2 (two) times daily.) 180 tablet 0    propranolol (INDERAL) 10 MG tablet Take 10 mg by mouth as needed (anxiety).      pyridOXINE (VITAMIN B-6) 50 MG tablet Take 50 mg by mouth daily.        risperiDONE (RISPERDAL) 2 MG tablet Take 1 mg by mouth at bedtime.      rosuvastatin (CRESTOR) 40 MG tablet Take  1 tablet   Daily  for Cholesterol 90 tablet 3    tamsulosin (FLOMAX) 0.4 MG CAPS capsule Take 0.4 mg by mouth at bedtime.      zolpidem (AMBIEN) 10 MG tablet Take 1 tablet (10 mg total) by mouth at bedtime. 30 tablet 1    amLODipine (NORVASC) 10 MG tablet Take 1 tablet (10 mg total) by mouth daily. (Patient not taking: Reported on 08/25/2022) 30 tablet 1 Not Taking   azithromycin (ZITHROMAX) 250 MG tablet Take 2 tablets (500 mg) on Day 1 followed by 1 tablet  (250 mg) daily until complete. (Patient not taking: Reported on 08/25/2022) 6 each 1 Completed Course   Cholecalciferol (VITAMIN D) 125 MCG (5000 UT) CAPS Take 5,000 Units by mouth daily. (Patient not taking: Reported on 08/25/2022) 30 capsule 0 Not Taking   glucose blood (ONETOUCH VERIO) test strip 1 each by Other route daily in the afternoon. Use as instructed 100 each 3    No Known Allergies  Social History   Tobacco Use   Smoking status: Former    Types: Cigarettes    Quit date: 11/22/1992    Years since quitting: 29.7   Smokeless tobacco: Never  Substance Use Topics   Alcohol use: No    Family History  Problem Relation Age of Onset   Heart disease Father    Hypertension Father    Hypotension Mother    Kidney disease Brother    Colon cancer Neg Hx      Review of Systems  HENT:  Positive for hearing loss.   Eyes:        Blurred vision  Respiratory: Negative.     Cardiovascular:  Positive for leg swelling.       Htn  Endocrine: Negative.   Genitourinary:  Positive for frequency and urgency.       Poor bladder control ED Prostate cancer  Musculoskeletal:  Positive for arthralgias.  Allergic/Immunologic: Negative.   Neurological: Negative.   Hematological: Negative.   Psychiatric/Behavioral: Negative.      Objective:  Physical Exam Constitutional:      Appearance: Normal appearance. He is normal weight.  HENT:     Head: Normocephalic and  atraumatic.     Nose: Nose normal.  Eyes:     Pupils: Pupils are equal, round, and reactive to light.  Cardiovascular:     Pulses: Normal pulses.  Pulmonary:     Effort: Pulmonary effort is normal.  Musculoskeletal:        General: Tenderness present.     Cervical back: Normal range of motion and neck supple.     Comments: , the patient has a range from 0-115 on the right and 0-130 on the left.  Pain with palpation over the medial compartment.  Crepitus with range of motion.  Calves are soft and nontender.  Skin:    General: Skin is warm and dry.  Neurological:     General: No focal deficit present.     Mental Status: He is alert and oriented to person, place, and time. Mental status is at baseline.  Psychiatric:        Mood and Affect: Mood normal.        Behavior: Behavior normal.        Thought Content: Thought content normal.        Judgment: Judgment normal.     Vital signs in last 24 hours:    Labs:   Estimated body mass index is 27.62 kg/m as calculated from the following:   Height as of 08/31/22: '5\' 9"'$  (1.753 m).   Weight as of 08/31/22: 84.8 kg.   Imaging Review Plain radiographs demonstrate  4 views of bilateral knees including AP, PA, lateral and sunrise views were obtained.  X-rays demonstrate end-stage complete bone-on-bone collapse of the medial compartment of the right knee with a mild degree of tibial subluxation.  The left knee shows at least moderate medial joint  space narrowing with a medial femoral condyle spur.  Moderate patellofemoral joint arthritis.  No acute fracture noted.  Some scattered vascular calcification noted.      Assessment/Plan:  End stage arthritis, right knee   The patient history, physical examination, clinical judgment of the provider and imaging studies are consistent with end stage degenerative joint disease of the right knee(s) and total knee arthroplasty is deemed medically necessary. The treatment options including medical management, injection therapy arthroscopy and arthroplasty were discussed at length. The risks and benefits of total knee arthroplasty were presented and reviewed. The risks due to aseptic loosening, infection, stiffness, patella tracking problems, thromboembolic complications and other imponderables were discussed. The patient acknowledged the explanation, agreed to proceed with the plan and consent was signed. Patient is being admitted for inpatient treatment for surgery, pain control, PT, OT, prophylactic antibiotics, VTE prophylaxis, progressive ambulation and ADL's and discharge planning. The patient is planning to be discharged home with home health services     Patient's anticipated LOS is less than 2 midnights, meeting these requirements: - Younger than 58 - Lives within 1 hour of care - Has a competent adult at home to recover with post-op recover - NO history of  - Chronic pain requiring opiods  - Diabetes  - Coronary Artery Disease  - Heart failure  - Heart attack  - Stroke  - DVT/VTE  - Cardiac arrhythmia  - Respiratory Failure/COPD  - Renal failure  - Anemia  - Advanced Liver disease

## 2022-09-02 ENCOUNTER — Telehealth: Payer: Self-pay

## 2022-09-02 NOTE — Patient Outreach (Signed)
  Care Coordination   09/02/2022 Name: JAHDIEL KROL MRN: [id number] DOB: Aug 24, 1941   Care Coordination Outreach Attempts:  A third unsuccessful outreach was attempted today to offer the patient with information about available care coordination services as a benefit of their health plan.   Follow Up Plan:  No further outreach attempts will be made at this time. We have been unable to contact the patient to offer or enroll patient in care coordination services  Encounter Outcome:  No Answer  Care Coordination Interventions Activated:  No   Care Coordination Interventions:  No, not indicated    Jone Baseman, RN, MSN [facility] Care Management Coordinator Direct Line [phone]

## 2022-09-06 ENCOUNTER — Encounter (HOSPITAL_COMMUNITY): Payer: Self-pay | Admitting: Orthopedic Surgery

## 2022-09-06 ENCOUNTER — Ambulatory Visit (HOSPITAL_COMMUNITY)
Admission: RE | Admit: 2022-09-06 | Discharge: 2022-09-06 | Disposition: A | Discharge status: Home / Self Care | Payer: Medicare Other | Attending: Orthopedic Surgery | Admitting: Orthopedic Surgery

## 2022-09-06 ENCOUNTER — Encounter (HOSPITAL_COMMUNITY)
Admission: RE | Disposition: A | Discharge status: Home / Self Care | Payer: Self-pay | Source: Home / Self Care | Attending: Orthopedic Surgery

## 2022-09-06 ENCOUNTER — Ambulatory Visit (HOSPITAL_BASED_OUTPATIENT_CLINIC_OR_DEPARTMENT_OTHER): Payer: Medicare Other | Admitting: Anesthesiology

## 2022-09-06 ENCOUNTER — Other Ambulatory Visit: Payer: Self-pay

## 2022-09-06 ENCOUNTER — Ambulatory Visit (HOSPITAL_COMMUNITY): Payer: Medicare Other | Admitting: Physician Assistant

## 2022-09-06 DIAGNOSIS — F419 Anxiety disorder, unspecified: Secondary | ICD-10-CM | POA: Insufficient documentation

## 2022-09-06 DIAGNOSIS — I2510 Atherosclerotic heart disease of native coronary artery without angina pectoris: Secondary | ICD-10-CM

## 2022-09-06 DIAGNOSIS — N183 Chronic kidney disease, stage 3 unspecified: Secondary | ICD-10-CM | POA: Insufficient documentation

## 2022-09-06 DIAGNOSIS — M25761 Osteophyte, right knee: Secondary | ICD-10-CM | POA: Diagnosis not present

## 2022-09-06 DIAGNOSIS — Z87891 Personal history of nicotine dependence: Secondary | ICD-10-CM

## 2022-09-06 DIAGNOSIS — I129 Hypertensive chronic kidney disease with stage 1 through stage 4 chronic kidney disease, or unspecified chronic kidney disease: Secondary | ICD-10-CM

## 2022-09-06 DIAGNOSIS — E1122 Type 2 diabetes mellitus with diabetic chronic kidney disease: Secondary | ICD-10-CM | POA: Diagnosis not present

## 2022-09-06 DIAGNOSIS — I351 Nonrheumatic aortic (valve) insufficiency: Secondary | ICD-10-CM | POA: Insufficient documentation

## 2022-09-06 DIAGNOSIS — M1711 Unilateral primary osteoarthritis, right knee: Secondary | ICD-10-CM

## 2022-09-06 HISTORY — PX: TOTAL KNEE ARTHROPLASTY: SHX125

## 2022-09-06 SURGERY — ARTHROPLASTY, KNEE, TOTAL
Anesthesia: Monitor Anesthesia Care | Site: Knee | Laterality: Right

## 2022-09-06 MED ORDER — ROPIVACAINE HCL 5 MG/ML IJ SOLN
INTRAMUSCULAR | Status: DC | PRN
  Administered 2022-09-06: 30 mL via PERINEURAL

## 2022-09-06 MED ORDER — LACTATED RINGERS IV BOLUS: 250.0000 mL | Freq: Once | INTRAVENOUS | Status: DC

## 2022-09-06 MED ORDER — ORAL CARE MOUTH RINSE: 15.0000 mL | Freq: Once | OROMUCOSAL | Status: AC

## 2022-09-06 MED ORDER — BUPIVACAINE-EPINEPHRINE (PF) 0.25% -1:200000 IJ SOLN
INTRAMUSCULAR | Status: AC
  Filled 2022-09-06: qty 30

## 2022-09-06 MED ORDER — CHLORHEXIDINE GLUCONATE 0.12 % MT SOLN
15.0000 mL | Freq: Once | OROMUCOSAL | Status: AC
  Administered 2022-09-06: 15 mL via OROMUCOSAL

## 2022-09-06 MED ORDER — BUPIVACAINE LIPOSOME 1.3 % IJ SUSP
INTRAMUSCULAR | Status: AC
  Filled 2022-09-06: qty 20

## 2022-09-06 MED ORDER — 0.9 % SODIUM CHLORIDE (POUR BTL) OPTIME
TOPICAL | Status: DC | PRN
  Administered 2022-09-06: 1000 mL

## 2022-09-06 MED ORDER — PROPOFOL 500 MG/50ML IV EMUL
INTRAVENOUS | Status: DC | PRN
  Administered 2022-09-06: 60 ug/kg/min via INTRAVENOUS

## 2022-09-06 MED ORDER — TRANEXAMIC ACID 1000 MG/10ML IV SOLN
2000.0000 mg | INTRAVENOUS | Status: DC
  Filled 2022-09-06: qty 20

## 2022-09-06 MED ORDER — TRANEXAMIC ACID 1000 MG/10ML IV SOLN
INTRAVENOUS | Status: DC | PRN
  Administered 2022-09-06: 2000 mg via TOPICAL

## 2022-09-06 MED ORDER — ONDANSETRON HCL 4 MG/2ML IJ SOLN
INTRAMUSCULAR | Status: DC | PRN
  Administered 2022-09-06: 4 mg via INTRAVENOUS

## 2022-09-06 MED ORDER — LACTATED RINGERS IV BOLUS
500.0000 mL | Freq: Once | INTRAVENOUS | Status: AC
  Administered 2022-09-06: 500 mL via INTRAVENOUS

## 2022-09-06 MED ORDER — TRANEXAMIC ACID-NACL 1000-0.7 MG/100ML-% IV SOLN: 1000.0000 mg | Freq: Once | INTRAVENOUS | Status: DC

## 2022-09-06 MED ORDER — TRANEXAMIC ACID-NACL 1000-0.7 MG/100ML-% IV SOLN
1000.0000 mg | INTRAVENOUS | Status: AC
  Administered 2022-09-06: 1000 mg via INTRAVENOUS
  Filled 2022-09-06: qty 100

## 2022-09-06 MED ORDER — PROPOFOL 1000 MG/100ML IV EMUL
INTRAVENOUS | Status: AC
  Filled 2022-09-06: qty 100

## 2022-09-06 MED ORDER — SODIUM CHLORIDE 0.9 % IR SOLN
Status: DC | PRN
  Administered 2022-09-06: 1000 mL

## 2022-09-06 MED ORDER — TIZANIDINE HCL 2 MG PO CAPS: 2.0000 mg | ORAL_CAPSULE | Freq: Three times a day (TID) | ORAL | 0 refills | Status: DC | PRN

## 2022-09-06 MED ORDER — BUPIVACAINE IN DEXTROSE 0.75-8.25 % IT SOLN
INTRATHECAL | Status: DC | PRN
  Administered 2022-09-06: 2 mL via INTRATHECAL

## 2022-09-06 MED ORDER — BUPIVACAINE LIPOSOME 1.3 % IJ SUSP: 20.0000 mL | Freq: Once | INTRAMUSCULAR | Status: DC

## 2022-09-06 MED ORDER — MIDAZOLAM HCL 2 MG/2ML IJ SOLN
1.0000 mg | INTRAMUSCULAR | Status: DC
  Filled 2022-09-06: qty 2

## 2022-09-06 MED ORDER — ACETAMINOPHEN 500 MG PO TABS
1000.0000 mg | ORAL_TABLET | Freq: Once | ORAL | Status: AC
  Administered 2022-09-06: 1000 mg via ORAL
  Filled 2022-09-06: qty 2

## 2022-09-06 MED ORDER — FENTANYL CITRATE PF 50 MCG/ML IJ SOSY
50.0000 ug | PREFILLED_SYRINGE | INTRAMUSCULAR | Status: DC
  Administered 2022-09-06: 50 ug via INTRAVENOUS
  Filled 2022-09-06: qty 2

## 2022-09-06 MED ORDER — LACTATED RINGERS IV SOLN: INTRAVENOUS | Status: DC

## 2022-09-06 MED ORDER — CEFAZOLIN SODIUM-DEXTROSE 2-4 GM/100ML-% IV SOLN
2.0000 g | INTRAVENOUS | Status: AC
  Administered 2022-09-06: 2 g via INTRAVENOUS
  Filled 2022-09-06: qty 100

## 2022-09-06 MED ORDER — PROPOFOL 10 MG/ML IV BOLUS
INTRAVENOUS | Status: DC | PRN
  Administered 2022-09-06: 20 mg via INTRAVENOUS

## 2022-09-06 MED ORDER — OXYCODONE HCL 5 MG/5ML PO SOLN: 5.0000 mg | Freq: Once | ORAL | Status: DC | PRN

## 2022-09-06 MED ORDER — SODIUM CHLORIDE (PF) 0.9 % IJ SOLN
INTRAMUSCULAR | Status: AC
  Filled 2022-09-06: qty 20

## 2022-09-06 MED ORDER — DEXAMETHASONE SODIUM PHOSPHATE 10 MG/ML IJ SOLN
INTRAMUSCULAR | Status: DC | PRN
  Administered 2022-09-06: 10 mg

## 2022-09-06 MED ORDER — OXYCODONE HCL 5 MG PO TABS: 5.0000 mg | ORAL_TABLET | ORAL | 0 refills | Status: AC | PRN

## 2022-09-06 MED ORDER — OXYCODONE HCL 5 MG PO TABS: 5.0000 mg | ORAL_TABLET | Freq: Once | ORAL | Status: DC | PRN

## 2022-09-06 MED ORDER — HYDROMORPHONE HCL 1 MG/ML IJ SOLN: 0.2500 mg | INTRAMUSCULAR | Status: DC | PRN

## 2022-09-06 MED ORDER — ONDANSETRON HCL 4 MG/2ML IJ SOLN: 4.0000 mg | Freq: Once | INTRAMUSCULAR | Status: DC | PRN

## 2022-09-06 MED ORDER — DEXAMETHASONE SODIUM PHOSPHATE 10 MG/ML IJ SOLN
INTRAMUSCULAR | Status: DC | PRN
  Administered 2022-09-06: 4 mg via INTRAVENOUS

## 2022-09-06 MED ORDER — POVIDONE-IODINE 10 % EX SWAB: 2.0000 | Freq: Once | CUTANEOUS | Status: DC

## 2022-09-06 MED ORDER — AMISULPRIDE (ANTIEMETIC) 5 MG/2ML IV SOLN: 10.0000 mg | Freq: Once | INTRAVENOUS | Status: DC | PRN

## 2022-09-06 MED ORDER — SODIUM CHLORIDE (PF) 0.9 % IJ SOLN
INTRAMUSCULAR | Status: AC
  Filled 2022-09-06: qty 50

## 2022-09-06 SURGICAL SUPPLY — 49 items
ATTUNE MED DOME PAT 38 KNEE (Knees) IMPLANT
ATTUNE PS FEM RT SZ 6 CEM KNEE (Femur) IMPLANT
ATTUNE PSRP INSR SZ6 5 KNEE (Insert) IMPLANT
BAG COUNTER SPONGE SURGICOUNT (BAG) IMPLANT
BAG DECANTER FOR FLEXI CONT (MISCELLANEOUS) ×1 IMPLANT
BAG SPEC THK2 15X12 ZIP CLS (MISCELLANEOUS) ×1
BAG SPNG CNTER NS LX DISP (BAG)
BAG ZIPLOCK 12X15 (MISCELLANEOUS) ×1 IMPLANT
BASE TIBIAL ROT PLAT SZ 7 KNEE (Knees) IMPLANT
BLADE SAG 18X100X1.27 (BLADE) ×1 IMPLANT
BLADE SAW SGTL 11.0X1.19X90.0M (BLADE) ×1 IMPLANT
BLADE SURG SZ10 CARB STEEL (BLADE) ×2 IMPLANT
BNDG CMPR MED 10X6 ELC LF (GAUZE/BANDAGES/DRESSINGS) ×1
BNDG ELASTIC 6X10 VLCR STRL LF (GAUZE/BANDAGES/DRESSINGS) ×1 IMPLANT
BOWL SMART MIX CTS (DISPOSABLE) ×1 IMPLANT
BSPLAT TIB 7 CMNT ROT PLAT STR (Knees) ×1 IMPLANT
CEMENT HV SMART SET (Cement) ×2 IMPLANT
COVER SURGICAL LIGHT HANDLE (MISCELLANEOUS) ×1 IMPLANT
DRAPE INCISE IOBAN 66X45 STRL (DRAPES) ×1 IMPLANT
DRAPE U-SHAPE 47X51 STRL (DRAPES) ×1 IMPLANT
DRSG AQUACEL AG ADV 3.5X10 (GAUZE/BANDAGES/DRESSINGS) ×1 IMPLANT
DURAPREP 26ML APPLICATOR (WOUND CARE) ×1 IMPLANT
ELECT REM PT RETURN 15FT ADLT (MISCELLANEOUS) ×1 IMPLANT
GLOVE BIO SURGEON STRL SZ7.5 (GLOVE) ×1 IMPLANT
GLOVE BIO SURGEON STRL SZ8.5 (GLOVE) ×1 IMPLANT
GLOVE BIOGEL PI IND STRL 8 (GLOVE) ×1 IMPLANT
GLOVE BIOGEL PI IND STRL 9 (GLOVE) ×1 IMPLANT
GOWN STRL REUS W/ TWL XL LVL3 (GOWN DISPOSABLE) ×2 IMPLANT
GOWN STRL REUS W/TWL XL LVL3 (GOWN DISPOSABLE) ×2
HANDPIECE INTERPULSE COAX TIP (DISPOSABLE) ×1
HOOD PEEL AWAY FLYTE STAYCOOL (MISCELLANEOUS) ×3 IMPLANT
KIT TURNOVER KIT A (KITS) IMPLANT
NDL HYPO 21X1.5 SAFETY (NEEDLE) ×2 IMPLANT
NEEDLE HYPO 21X1.5 SAFETY (NEEDLE) ×2 IMPLANT
NS IRRIG 1000ML POUR BTL (IV SOLUTION) ×1 IMPLANT
PACK TOTAL KNEE CUSTOM (KITS) ×1 IMPLANT
PROTECTOR NERVE ULNAR (MISCELLANEOUS) ×1 IMPLANT
SET HNDPC FAN SPRY TIP SCT (DISPOSABLE) ×1 IMPLANT
SPIKE FLUID TRANSFER (MISCELLANEOUS) ×3 IMPLANT
SUT VIC AB 1 CTX 36 (SUTURE) ×1
SUT VIC AB 1 CTX36XBRD ANBCTR (SUTURE) ×1 IMPLANT
SUT VIC AB 3-0 CT1 27 (SUTURE) ×3
SUT VIC AB 3-0 CT1 TAPERPNT 27 (SUTURE) ×3 IMPLANT
SYR CONTROL 10ML LL (SYRINGE) ×2 IMPLANT
TIBIAL BASE ROT PLAT SZ 7 KNEE (Knees) ×1 IMPLANT
TRAY CATH INTERMITTENT SS 16FR (CATHETERS) IMPLANT
TRAY FOLEY MTR SLVR 16FR STAT (SET/KITS/TRAYS/PACK) ×1 IMPLANT
WATER STERILE IRR 1000ML POUR (IV SOLUTION) ×2 IMPLANT
WRAP KNEE MAXI GEL POST OP (GAUZE/BANDAGES/DRESSINGS) ×1 IMPLANT

## 2022-09-06 NOTE — Evaluation (Signed)
Physical Therapy Evaluation Patient Details Name: [name] Colon MRN: [id number] DOB: 09-02-1941 Today's Date: 09/06/2022  History of Present Illness  81 yo male s/p R TKA 09/06/22. Hx of DM, gout, PTSD, syncope, sinus bradycardia  Clinical Impression  On eval in PACU/POD 0, pt was Min guard A for mobility. He walked ~75 feet with a RW and ascended/descended portable stairs with a RW. Reviewed TKA exercise program-pt stated he already has an exercise sheet at home that he can follow. Wife was present for session. She was able to practice stabilizing the RW for pt during stair negotiation. Wife stated that her daughter and son n law will be available to help pt into house as well. Pt's BP has been elevated: last reading being 176/91-RN aware. All education completed.        Recommendations for follow up therapy are one component of a multi-disciplinary discharge planning process, led by the attending physician.  Recommendations may be updated based on patient status, additional functional criteria and insurance authorization.  Follow Up Recommendations Follow physician's recommendations for discharge plan and follow up therapies      Assistance Recommended at Discharge Frequent or constant Supervision/Assistance  Patient can return home with the following  A little help with walking and/or transfers;A little help with bathing/dressing/bathroom;Help with stairs or ramp for entrance;Assistance with cooking/housework;Assist for transportation    Equipment Recommendations Rolling walker (2 wheels)  Recommendations for Other Services       Functional Status Assessment Patient has had a recent decline in their functional status and demonstrates the ability to make significant improvements in function in a reasonable and predictable amount of time.     Precautions / Restrictions Precautions Precautions: Fall Restrictions Weight Bearing Restrictions: No Other Position/Activity  Restrictions: WBAT      Mobility  Bed Mobility Overal bed mobility: Needs Assistance Bed Mobility: Supine to Sit     Supine to sit: Supervision, HOB elevated     General bed mobility comments: Supv for safety    Transfers Overall transfer level: Needs assistance Equipment used: Rolling walker (2 wheels) Transfers: Sit to/from Stand Sit to Stand: Min guard           General transfer comment: Min guard for safety. Cues for safety, hand/LE placement.    Ambulation/Gait Ambulation/Gait assistance: Min guard Gait Distance (Feet): 75 Feet Assistive device: Rolling walker (2 wheels) Gait Pattern/deviations: Step-to pattern, Step-through pattern, Decreased stride length       General Gait Details: Min guard for safety. Cues for safety, sequence. As distance increased, pt able to use step thru pattern. Pt denied dizziness.  Stairs Stairs: Yes Stairs assistance: Min assist Stair Management: Step to pattern, With walker, Forwards Number of Stairs: 2 General stair comments: up and over portable stairs x 1. Pt used RW going forward. Wife was present to observe and to stabilize walker as he ascended/descended. Cues for safety, technique, sequence.  Wheelchair Mobility    Modified Rankin (Stroke Patients Only)       Balance Overall balance assessment: Needs assistance         Standing balance support: Bilateral upper extremity supported, Reliant on assistive device for balance, During functional activity Standing balance-Leahy Scale: Fair                               Pertinent Vitals/Pain Pain Assessment Pain Assessment: Faces Faces Pain Scale: Hurts a little bit Pain Location: R knee Pain Descriptors /  Indicators: Discomfort, Sore Pain Intervention(s): Monitored during session, Repositioned    Home Living Family/patient expects to be discharged to:: Private residence Living Arrangements: Spouse/significant other Available Help at Discharge:  Family Type of Home: House Home Access: Stairs to enter Entrance Stairs-Rails: None Entrance Stairs-Number of Steps: 2 Alternate Level Stairs-Number of Steps: 1 flight to bonus room (pt spends a fair amount of time there) Home Layout: Multi-level Home Equipment: Cane - single point      Prior Function Prior Level of Function : Independent/Modified Independent                     Hand Dominance        Extremity/Trunk Assessment   Upper Extremity Assessment Upper Extremity Assessment: Overall WFL for tasks assessed    Lower Extremity Assessment Lower Extremity Assessment: Generalized weakness    Cervical / Trunk Assessment Cervical / Trunk Assessment: Normal  Communication   Communication: No difficulties  Cognition Arousal/Alertness: Awake/alert Behavior During Therapy: WFL for tasks assessed/performed Overall Cognitive Status: Within Functional Limits for tasks assessed                                          General Comments      Exercises Total Joint Exercises Ankle Circles/Pumps: AROM, Both, 10 reps Quad Sets: AROM, Both, 10 reps Hip ABduction/ADduction: AROM, Right, 10 reps Straight Leg Raises: AROM, Right, 10 reps   Assessment/Plan    PT Assessment Patient needs continued PT services  PT Problem List Decreased strength;Decreased mobility;Decreased range of motion;Decreased activity tolerance;Decreased balance;Decreased knowledge of use of DME;Pain       PT Treatment Interventions DME instruction;Gait training;Therapeutic activities;Therapeutic exercise;Patient/family education;Balance training;Stair training;Functional mobility training    PT Goals (Current goals can be found in the Care Plan section)  Acute Rehab PT Goals Patient Stated Goal: home PT Goal Formulation: With patient/family Time For Goal Achievement: 09/20/22 Potential to Achieve Goals: Good    Frequency 7X/week     Co-evaluation                AM-PAC PT "6 Clicks" Mobility  Outcome Measure Help needed turning from your back to your side while in a flat bed without using bedrails?: A Little Help needed moving from lying on your back to sitting on the side of a flat bed without using bedrails?: A Little Help needed moving to and from a bed to a chair (including a wheelchair)?: A Little Help needed standing up from a chair using your arms (e.g., wheelchair or bedside chair)?: A Little Help needed to walk in hospital room?: A Little Help needed climbing 3-5 steps with a railing? : A Little 6 Click Score: 18    End of Session Equipment Utilized During Treatment: Gait belt Activity Tolerance: Patient tolerated treatment well Patient left: in chair;with call bell/phone within reach;with family/visitor present        Time: 1732-1800 PT Time Calculation (min) (ACUTE ONLY): 28 min   Charges:   PT Evaluation $PT Eval Low Complexity: 1 Low PT Treatments $Gait Training: 8-22 mins           Doreatha Massed, PT Acute Rehabilitation  Office: [phone] Pager: [phone]

## 2022-09-06 NOTE — Op Note (Signed)
PATIENT ID:      [name] Colon  MRN:     [id number] DOB/AGE:    February 24, 1941 / 81 y.o.       OPERATIVE REPORT   DATE OF PROCEDURE:  09/06/2022      PREOPERATIVE DIAGNOSIS:   RIGHT KNEE OSTEOARTHRITIS      Estimated body mass index is 27.35 kg/m as calculated from the following:   Height as of this encounter: '5\' 9"'$  (1.753 m).   Weight as of this encounter: 84 kg.                                                       POSTOPERATIVE DIAGNOSIS:   Same                                                                  PROCEDURE:  Procedure(s): RIGHT TOTAL KNEE ARTHROPLASTY Using DepuyAttune RP implants #6R Femur, #7Tibia, 5 mm Attune RP bearing, 38 Patella    SURGEON: Kerin Salen  ASSISTANT:   Kerry Hough. [employer]   (Present and scrubbed throughout the case, critical for assistance with exposure, retraction, instrumentation, and closure.)        ANESTHESIA: spinal, 20cc Exparel, 50cc 0.25% Marcaine EBL: 300 cc FLUID REPLACEMENT: 1500 cc crystaloid TOURNIQUET: DRAINS: None TRANEXAMIC ACID: 1gm IV, 2gm topical COMPLICATIONS:  None         INDICATIONS FOR PROCEDURE: The patient has  RIGHT KNEE OSTEOARTHRITIS, Var deformities, XR shows bone on bone arthritis, lateral subluxation of tibia. Patient has failed all conservative measures including anti-inflammatory medicines, narcotics, attempts at exercise and weight loss, cortisone injections and viscosupplementation.  Risks and benefits of surgery have been discussed, questions answered.   DESCRIPTION OF PROCEDURE: The patient identified by armband, received  IV antibiotics, in the holding area at [facility]. Patient taken to the operating room, appropriate anesthetic monitors were attached, and spinal anesthesia was  induced. IV Tranexamic acid was given.Tourniquet applied high to the operative thigh. Lateral post and foot positioner applied to the table, the lower extremity was then prepped and draped in usual sterile fashion from  the toes to the tourniquet. Time-out procedure was performed. Kerry Hough. [facility] PAC, was present and scrubbed throughout the case, critical for assistance with, positioning, exposure, retraction, instrumentation, and closure.The skin and subcutaneous tissue along the incision was injected with 20 cc of a mixture of Exparel and Marcaine solution, using a 20-gauge by 1-1/2 inch needle. We began the operation, with the knee flexed 130 degrees, by making the anterior midline incision starting at handbreadth above the patella going over the patella 1 cm medial to and 4 cm distal to the tibial tubercle. Small bleeders in the skin and the subcutaneous tissue identified and cauterized. Transverse retinaculum was incised and reflected medially and a medial parapatellar arthrotomy was accomplished. the patella was everted and theprepatellar fat pad resected. The superficial medial collateral ligament was then elevated from anterior to posterior along the proximal flare of the tibia and anterior half of the menisci resected. The knee was hyperflexed exposing bone on bone arthritis. Peripheral  and notch osteophytes as well as the cruciate ligaments were then resected. We continued to work our way around posteriorly along the proximal tibia, and externally rotated the tibia subluxing it out from underneath the femur. A McHale PCL retractor was placed through the notch and a lateral Hohmann retractor placed, and we then entered the proximal tibia in line with the Depuy starter drill in line with the axis of the tibia followed by an intramedullary guide rod and 0-degree posterior slope cutting guide. The tibial cutting guide, 4 degree posterior sloped, was pinned into place allowing resection of 3 mm of bone medially and 11 mm of bone laterally. Satisfied with the tibial resection, we then entered the distal femur 2 mm anterior to the PCL origin with the intramedullary guide rod and applied the distal femoral cutting guide set at 9  mm, with 5 degrees of valgus. This was pinned along the epicondylar axis. At this point, the distal femoral cut was accomplished without difficulty. We then sized for a #6R femoral component and pinned the guide in 3 degrees of external rotation. The chamfer cutting guide was pinned into place. The anterior, posterior, and chamfer cuts were accomplished without difficulty followed by the Attune RP box cutting guide and the box cut. We also removed posterior osteophytes from the posterior femoral condyles. The posterior capsule was injected with Exparel solution. The knee was brought into full extension. We checked our extension gap and fit a 5 mm bearing. Distracting in extension with a lamina spreader,  bleeders in the posterior capsule, Posterior medial and posterior lateral gutter were cauterized.  The transexamic acid-soaked sponge was then placed in the gap of the knee in extension. The knee was flexed 30. The posterior patella cut was accomplished with the 9.5 mm Attune cutting guide, sized for a 20m dome, and the fixation pegs drilled.The knee was then once again hyperflexed exposing the proximal tibia. We sized for a # 7 tibial base plate, applied the smokestack and the conical reamer followed by the the Delta fin keel punch. We then hammered into place the Attune RP trial femoral component, drilled the lugs, inserted a  5 mm trial bearing, trial patellar button, and took the knee through range of motion from 0-130 degrees. Medial and lateral ligamentous stability was checked. No thumb pressure was required for patellar Tracking. The tourniquet was not used. All trial components were removed, mating surfaces irrigated with pulse lavage, and dried with suction and sponges. 10 cc of the Exparel solution was applied to the cancellus bone of the patella distal femur and proximal tibia.  After waiting 30 seconds, the bony surfaces were again, dried with sponges. A double batch of DePuy HV cement was mixed and  applied to all bony metallic mating surfaces except for the posterior condyles of the femur itself. In order, we hammered into place the tibial tray and removed excess cement, the femoral component and removed excess cement. The final Attune RP bearing was inserted, and the knee brought to full extension with compression. The patellar button was clamped into place, and excess cement removed. The knee was held at 30 flexion with compression, while the cement cured. The wound was irrigated out with normal saline solution pulse lavage. The rest of the Exparel was injected into the parapatellar arthrotomy, subcutaneous tissues, and periosteal tissues. The parapatellar arthrotomy was closed with running #1 Vicryl suture. The subcutaneous tissue with 3-0 undyed Vicryl suture, and the skin with running 3-0 SQ vicryl. An Aquacil and  Ace wrap were applied. The patient was taken to recovery room without difficulty.   Kerin Salen 09/06/2022, 1:07 PM

## 2022-09-06 NOTE — Progress Notes (Signed)
Orthopedic Tech Progress Note Patient Details:  [name] Colon 1941/01/30 [id number]  Ortho Devices Type of Ortho Device: Bone foam zero knee Ortho Device/Splint Interventions: Application   Post Interventions Patient Tolerated: Well Instructions Provided: Care of device  [place] Pink 09/06/2022, 1:56 PM

## 2022-09-06 NOTE — Anesthesia Procedure Notes (Signed)
Anesthesia Regional Block: Adductor canal block   Pre-Anesthetic Checklist: , timeout performed,  Correct Patient, Correct Site, Correct Laterality,  Correct Procedure, Correct Position, site marked,  Risks and benefits discussed,  Surgical consent,  Pre-op evaluation,  At surgeon's request and post-op pain management  Laterality: Right  Prep: Maximum Sterile Barrier Precautions used, chloraprep       Needles:  Injection technique: Single-shot  Needle Type: Echogenic Stimulator Needle     Needle Length: 9cm  Needle Gauge: 22     Additional Needles:   Procedures:,,,, ultrasound used (permanent image in chart),,    Narrative:  Start time: 09/06/2022 11:00 AM End time: 09/06/2022 11:05 AM Injection made incrementally with aspirations every 5 mL.  Performed by: Personally  Anesthesiologist: Pervis Hocking, DO  Additional Notes: Monitors applied. No increased pain on injection. No increased resistance to injection. Injection made in 5cc increments. Good needle visualization. Patient tolerated procedure well.

## 2022-09-06 NOTE — Interval H&P Note (Signed)
History and Physical Interval Note:  09/06/2022 11:02 AM  [name] Colon  has presented today for surgery, with the diagnosis of RIGHT KNEE OSTEOARTHRITIS.  The various methods of treatment have been discussed with the patient and family. After consideration of risks, benefits and other options for treatment, the patient has consented to  Procedure(s): RIGHT TOTAL KNEE ARTHROPLASTY (Right) as a surgical intervention.  The patient's history has been reviewed, patient examined, no change in status, stable for surgery.  I have reviewed the patient's chart and labs.  Questions were answered to the patient's satisfaction.     Kerin Salen

## 2022-09-06 NOTE — Transfer of Care (Signed)
Immediate Anesthesia Transfer of Care Note  Patient: [name] Colon  Procedure(s) Performed: RIGHT TOTAL KNEE ARTHROPLASTY (Right: Knee)  Patient Location: PACU  Anesthesia Type:Spinal  Level of Consciousness: awake and drowsy  Airway & Oxygen Therapy: Patient Spontanous Breathing and Patient connected to face mask oxygen  Post-op Assessment: Report given to RN and Post -op Vital signs reviewed and unstable, Anesthesiologist notified  Post vital signs: Reviewed and stable  Last Vitals:  Vitals Value Taken Time  BP 94/66 09/06/22 1347  Temp    Pulse 54 09/06/22 1349  Resp 16 09/06/22 1349  SpO2 95 % 09/06/22 1349  Vitals shown include unvalidated device data.  Last Pain:  Vitals:   09/06/22 1125  TempSrc:   PainSc: 0-No pain         Complications: No notable events documented.

## 2022-09-06 NOTE — Anesthesia Postprocedure Evaluation (Signed)
Anesthesia Post Note  Patient: [name] Colon  Procedure(s) Performed: RIGHT TOTAL KNEE ARTHROPLASTY (Right: Knee)     Patient location during evaluation: PACU Anesthesia Type: Regional, Spinal and MAC Level of consciousness: oriented and awake and alert Pain management: pain level controlled Vital Signs Assessment: post-procedure vital signs reviewed and stable Respiratory status: spontaneous breathing, respiratory function stable and patient connected to nasal cannula oxygen Cardiovascular status: blood pressure returned to baseline and stable Postop Assessment: no headache, no backache and no apparent nausea or vomiting Anesthetic complications: no   No notable events documented.  Last Vitals:  Vitals:   09/06/22 1415 09/06/22 1430  BP: 128/69 (!) 147/73  Pulse: (!) 55 (!) 58  Resp: 14 17  Temp:    SpO2: 98% 100%    Last Pain:  Vitals:   09/06/22 1430  TempSrc:   PainSc: 0-No pain                 Delaine Hernandez

## 2022-09-06 NOTE — Anesthesia Procedure Notes (Signed)
Spinal  Patient location during procedure: OR Start time: 09/06/2022 11:58 AM End time: 09/06/2022 12:00 PM Reason for block: surgical anesthesia Staffing Performed: resident/CRNA  Resident/CRNA: [place] Lalo Tromp C, CRNA Performed by: [place] Nargis Abrams C, CRNA Authorized by: Janeece Riggers, MD   Preanesthetic Checklist Completed: patient identified, IV checked, site marked, risks and benefits discussed, surgical consent, monitors and equipment checked, pre-op evaluation and timeout performed Spinal Block Patient position: sitting Prep: DuraPrep and site prepped and draped Patient monitoring: heart rate, cardiac monitor, continuous pulse ox and blood pressure Approach: midline Location: L3-4 Injection technique: single-shot Needle Needle type: Pencan  Needle gauge: 24 G Needle length: 9 cm Assessment Sensory level: T4 Events: CSF return Additional Notes IV functioning, monitors applied to pt. Expiration date of kit checked and confirmed to be in date. Sterile prep and drape, hand hygiene and sterile gloved used. Pt was positioned and spine was prepped in sterile fashion. Skin was anesthetized with lidocaine. Free flow of clear CSF obtained prior to injecting local anesthetic into CSF x 1 attempt. Spinal needle aspirated freely following injection. Needle was carefully withdrawn, and pt tolerated procedure well. Loss of motor and sensory on exam post injection.

## 2022-09-06 NOTE — Discharge Instructions (Signed)

## 2022-09-07 ENCOUNTER — Encounter (HOSPITAL_COMMUNITY): Payer: Self-pay | Admitting: Orthopedic Surgery

## 2022-09-08 ENCOUNTER — Other Ambulatory Visit: Payer: Self-pay | Admitting: Interventional Radiology

## 2022-09-08 DIAGNOSIS — N2889 Other specified disorders of kidney and ureter: Secondary | ICD-10-CM

## 2022-09-16 ENCOUNTER — Encounter: Payer: Self-pay | Admitting: Internal Medicine

## 2022-09-27 ENCOUNTER — Other Ambulatory Visit: Payer: Medicare Other

## 2022-09-29 NOTE — Progress Notes (Deleted)
Name: [name] Colon  MRN/ DOB: [id number], September 04, 1941   Age/ Sex: 81 y.o., male    PCP: Unk Pinto, MD   Reason for Endocrinology Evaluation: Type 2 Diabetes Mellitus     Date of Initial Endocrinology Visit: 03/29/2022    PATIENT IDENTIFIER: [name] Colon is a 81 y.o. male with a past medical history of T2Dm, CAD, HTN. The patient presented for initial endocrinology clinic visit on 03/29/2022 for consultative assistance with his diabetes management.    HPI: [name] Colon is a poor historian and unable to provide detailed history  His referral was made under the diagnosis of diabetes mellitus.  But the patient tells me he is not sure he is ever been diagnosed with diabetes Diagnosed with DM 2017 A1c 6.6% at the time Hemoglobin A1c has ranged from 6.1% in 2017, peaking at 6.5% in 2023.    HYPERCALCEMIA HISTORY:  Pt has been noted with hypercalcemia since 10/2019 with a max level of 11.4 in 04/2021 with an inappropriately normal PTH at 42  I had ordered 24-hour urinary collection for calcium excretion in May 2023 but by November this was not done  SUBJECTIVE:   During the last visit (03/29/2022): A1c 6.5%   Today (_0 @): [name] Colon is here for a follow up on diabetes management and hypercalcemia. He checks his  blood sugars *** times daily. The patient has *** had hypoglycemic episodes since the last clinic visit, which typically occur *** x / - most often occuring ***. The patient is *** symptomatic with these episodes, with symptoms of {symptoms; hypoglycemia:9084048}.     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   s/p seed implantation   Arthritis    Axillary abscess    left   CAD (coronary artery disease)    Diverticulitis, colon    History of colonic polyps    HTN (hypertension)    Hyperglycemia    diet controlled   Hyperlipidemia    Pre-diabetes    Radiation proctitis    with chronic bleeding   Past Surgical History:  Past Surgical History:  Procedure Laterality Date   COLONOSCOPY     COLONOSCOPY W/ POLYPECTOMY     CORONARY ANGIOPLASTY WITH STENT PLACEMENT  04/2008   IR RADIOLOGIST EVAL & MGMT  07/22/2021   IR RADIOLOGIST EVAL & MGMT  10/20/2021   MASS EXCISION Left 10/06/2017   Procedure: EXCISION LEFT AXILLA CYST;  Surgeon: Erroll Luna, MD;  Location: [facility];  Service: General;  Laterality: Left;   TOTAL KNEE ARTHROPLASTY Right 09/06/2022   Procedure: RIGHT TOTAL KNEE ARTHROPLASTY;  Surgeon: Frederik Pear, MD;  Location: [facility];  Service: Orthopedics;  Laterality: Right;    Social History:  reports that he quit smoking about 29 years ago. His smoking use included cigarettes. He has never used smokeless tobacco. He reports that he does not drink alcohol and does not use drugs.  Family History:  Family History  Problem Relation Age of Onset   Heart disease Father    Hypertension Father    Hypotension Mother    Kidney disease Brother    Colon cancer Neg Hx      HOME MEDICATIONS: Allergies as of 09/30/2022   No Known Allergies      Medication List        Accurate as of September 29, 2022  9:56 AM. If you have any questions, ask your nurse or doctor.          allopurinol 300 MG tablet Commonly known as: ZYLOPRIM Take 300 mg by mouth daily.   amLODipine 5 MG tablet Commonly known as: NORVASC Take 5 mg by mouth daily.   amLODipine  10 MG tablet Commonly known as: NORVASC Take 1 tablet (10 mg total) by mouth daily.   aspirin 81 MG tablet Take 81 mg by mouth daily.   azithromycin 250 MG tablet Commonly known as: Zithromax Take 2 tablets (500 mg) on Day 1 followed by 1 tablet  (250 mg) daily until complete.   cetirizine 10 MG tablet Commonly known as: ZYRTEC Take 10 mg by mouth daily in the afternoon.   diphenhydramine-acetaminophen 25-500 MG Tabs tablet Commonly known as: TYLENOL PM Take 2 tablets by mouth at bedtime as needed (sleep/pain).   escitalopram 10 MG tablet Commonly known as: LEXAPRO Take 10 mg by mouth every evening.   losartan 100 MG tablet Commonly known as: COZAAR Take 100 mg by mouth daily.   Lubricating Plus Eye Drops 0.5 % Soln Generic drug: Carboxymethylcellulose Sod PF Apply 1 drop to eye 2 (two) times daily as needed (dry eyes).   nitroGLYCERIN 0.4 MG SL tablet Commonly known as: NITROSTAT Place 0.4 mg under the tongue every 5 (five) minutes as needed for chest pain.   OneTouch Verio test strip Generic drug: glucose blood 1 each by Other route daily in the afternoon. Use as instructed   potassium chloride SA 20 MEQ tablet Commonly known as: KLOR-CON M Take    1 tablet      2 x /day      for Potassium What changed:  how much to take how to take this when to take this additional instructions   propranolol 10 MG tablet Commonly known as: INDERAL Take 10 mg by mouth as needed (anxiety).   pyridOXINE 50 MG tablet Commonly known as: VITAMIN B6 Take 50 mg by mouth daily.   risperiDONE 2 MG tablet Commonly known as: RISPERDAL Take 1 mg by mouth at bedtime.   rosuvastatin 40 MG tablet Commonly known as: CRESTOR Take  1 tablet   Daily  for Cholesterol   tamsulosin 0.4 MG Caps capsule Commonly known as: FLOMAX Take 0.4 mg by mouth at bedtime.   tizanidine 2 MG capsule Commonly known as: Zanaflex Take 1 capsule (2 mg total) by mouth 3 (three) times daily as needed for  muscle spasms.   vitamin C 1000 MG tablet Take 1,000 mg by mouth daily.   Vitamin D 125 MCG (5000 UT) Caps Take 5,000 Units by mouth daily.   zolpidem 10 MG tablet Commonly known as: AMBIEN Take 1 tablet (10 mg total) by mouth at bedtime.         ALLERGIES: No Known Allergies   REVIEW OF SYSTEMS: A comprehensive ROS was conducted with the patient and is negative except as per HPI    OBJECTIVE:   VITAL SIGNS: There were no vitals taken for this  visit.   PHYSICAL EXAM:  General: Pt appears well and is in NAD  Neck: General: Supple without adenopathy or carotid bruits. Thyroid: Thyroid size normal.  No goiter or nodules appreciated.  Lungs: Clear with good BS bilat with no rales, rhonchi, or wheezes  Heart: RRR with normal S1 and S2 and no gallops; no murmurs; no rub  Abdomen: Normoactive bowel sounds, soft, nontender, without masses or organomegaly palpable  Extremities:  Lower extremities - No pretibial edema. No lesions.  Neuro: MS is good with appropriate affect, pt is alert and Ox3    DATA REVIEWED:  Lab Results  Component Value Date   HGBA1C 6.0 (H) 07/27/2022   HGBA1C 6.0 (H) 05/13/2022   HGBA1C 6.5 (A) 03/29/2022    Latest Reference Range & Units 01/07/22 11:33  Sodium 135 - 146 mmol/L 142  Potassium 3.5 - 5.3 mmol/L 4.1  Chloride 98 - 110 mmol/L 108  CO2 20 - 32 mmol/L 26  Glucose 65 - 99 mg/dL 133 (H)  Mean Plasma Glucose mg/dL 128  BUN 7 - 25 mg/dL 13  Creatinine 0.70 - 1.22 mg/dL 1.69 (H)  Calcium 8.6 - 10.3 mg/dL 8.6 - 10.3 mg/dL 10.8 (H) 10.8 (H)  BUN/Creatinine Ratio 6 - 22 (calc) 8  eGFR > OR = 60 mL/min/1.82m 40 (L)  AG Ratio 1.0 - 2.5 (calc) 1.5  AST 10 - 35 U/L 21  ALT 9 - 46 U/L 17  Total Protein 6.1 - 8.1 g/dL 7.3  Total Bilirubin 0.2 - 1.2 mg/dL 0.6   ASSESSMENT / PLAN / RECOMMENDATIONS:   1) Type 2 Diabetes Mellitus, Optimally controlled, With CKD III complications - Most recent A1c of 6.0%. Goal A1c < 7.0 %.    -His A1c is  optimal at this time -He already avoids sugar sweetened beverages, patient also advised to avoid eating sweets -No indication to start any glycemic agents at this time -He was shown how to use glucose meter and was advised to check glucose 2-3 times a week  MEDICATIONS: N/A  EDUCATION / INSTRUCTIONS: BG monitoring instructions: Patient is instructed to check his blood sugars 2-3 times a 1 week, fasting. [facility] if: BG persistently < 70  I reviewed the Rule of 15 for the treatment of hypoglycemia in detail with the patient. Literature supplied.   2) Diabetic complications:  Eye: Does not have known diabetic retinopathy.  Neuro/ Feet: Does not have known diabetic peripheral neuropathy. Renal: Patient does  have known baseline CKD.  He is under the care of nephrology   3) Hyperparathyroidism:  -Patient will need further evaluation to determine surgical candidacy -Given advanced age he will probably benefit with medical treatment rather than surgical -We will proceed with 24-hour urine collection for calcium excretion as well as bone density to screen for osteoporosis    Recommendations Stay hydrated Avoid over-the-counter calcium tablets Consume 2-3 dietary servings of calcium daily    Follow-up in 6 months         Signed electronically by: AMack Guise MD  [facility]  [place] [address], [place] [address]Phone: [phone]: [phone]  CC: MUnk Pinto MD [address]Phone: [phone] Fax: [phone]   Return to Endocrinology clinic as below: Future Appointments  Date Time Provider DTintah 09/30/2022  7:50 AM Estel Tonelli, IMelanie Crazier MD [facility] None  10/12/2022 10:00 AM [facility]  11/05/2022  1:40 PM GI-315 MR 1 GI-315MRI [facility] [address] [facility]  11/19/2022  9:30 AM Unk Pinto, MD [facility] None  02/21/2023  9:30 AM Alycia Rossetti, NP [facility] None  05/23/2023 10:00 AM Unk Pinto, MD [facility] None

## 2022-09-30 ENCOUNTER — Ambulatory Visit: Payer: Medicare Other | Admitting: Internal Medicine

## 2022-10-11 ENCOUNTER — Other Ambulatory Visit: Payer: Self-pay | Admitting: Interventional Radiology

## 2022-10-11 DIAGNOSIS — N2889 Other specified disorders of kidney and ureter: Secondary | ICD-10-CM

## 2022-10-12 ENCOUNTER — Telehealth: Payer: Medicare Other

## 2022-10-12 ENCOUNTER — Other Ambulatory Visit: Payer: Medicare Other

## 2022-11-05 ENCOUNTER — Inpatient Hospital Stay: Admission: RE | Admit: 2022-11-05 | Payer: Medicare Other | Source: Ambulatory Visit

## 2022-11-19 ENCOUNTER — Ambulatory Visit: Payer: Medicare Other | Admitting: Internal Medicine

## 2022-11-24 NOTE — Progress Notes (Signed)
Future Appointments  Date Time Provider Department  11/25/2022                       6 mo ov  11:30 AM Lucky Cowboy, MD [facility]  02/21/2023                       wellness  9:30 AM Raynelle Dick, NP [facility]  05/23/2023                        cpe 10:00 AM Lucky Cowboy, MD [facility]    History of Present Illness:       This very nice 82 y.o. MBM  presents for 6 month follow up with HTN, HLD, T2_DM and Vitamin D Deficiency. 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         Patient is treated for HTN  (1990) & BP has been controlled at home. Today's BP is at goal -  136/64.  In 2009, patient had PCA / Stent per Dr Antoine Poche.  Then a negative Lexiscan in 2017.    Patient has had no complaints of any cardiac type chest pain, palpitations, dyspnea / orthopnea / PND, dizziness, claudication, or dependent edema.       Hyperlipidemia is controlled with diet & meds. Patient denies myalgias or other med SE's. Last Lipids were at goal :  Lab Results  Component Value Date   CHOL 117 07/27/2022   HDL 44 07/27/2022   LDLCALC 59 07/27/2022   TRIG 63 07/27/2022   CHOLHDL 2.7 07/27/2022     Also, the patient has history of T2_NIDDM (2003)  controlled by  diet w/CKD3a  (GFR 57) and also has secondary hyperparathyroidism. Patient  has had no symptoms of reactive hypoglycemia, diabetic polys, paresthesias or visual blurring.  Last A1c was near goal :  Lab Results  Component Value Date   HGBA1C 6.0 (H) 07/27/2022                                                         Further, the patient also has history of Vitamin D Deficiency ("38" /2012)  and supplements vitamin D . Last vitamin D was   Lab Results  Component Value Date   VD25OH 60 07/27/2022     Current Outpatient Medications on File  Prior to Visit  Medication Sig   allopurinol (ZYLOPRIM) 300 MG tablet Take  daily.   Ascorbic Acid (VITAMIN C) 1000 MG tablet Take  daily.   aspirin 81 MG tablet Take daily.   cetirizine (ZYRTEC) 10 MG tablet Take  daily in the afternoon.   TYLENOL PM 25-500 MG TABS tablet Take 2 tablets at bedtime as needed (sleep/pain).   escitalopram (LEXAPRO) 10 MG tablet Take  every evening.   losartan (COZAAR) 100  MG tablet Take daily.   LUBRICATING PLUS EYE DROPS 0.5 %  Apply 1 drop to eye 2 (two) times daily as needed (dry eyes).   NITROSTAT 0.4 MG SL tablet as needed for chest pain.   potassium chloride  20 MEQ tablet Take    1 tablet      2 x /day     propranolol (INDERAL) 10 MG tablet Take  as needed (anxiety).   pyridOXINE (VITAMIN B-6) 50 MG tablet Take  daily.     risperiDONE (RISPERDAL) 2 MG tablet Take at bedtime.   rosuvastatin (CRESTOR) 40 MG tablet Take  1 tablet   Daily  for Cholesterol   tamsulosin (FLOMAX) 0.4 MG CAPS capsule Takat bedtime.   tizanidine (2 MG capsule Take 1 capsule  3 (three) times daily as needed    No Known Allergies   PMHx:   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   Arthritis    Axillary abscess    left   CAD (coronary artery disease)    Diverticulitis, colon    History of colonic polyps    HTN (hypertension)    Hyperglycemia    diet controlled   Hyperlipidemia    Pre-diabetes    Radiation proctitis    with chronic bleeding     Immunization History  Administered Date(s) Administered   Fluad Quad(high Dose) 09/02/2020   Influenza, High Dose  09/08/2018, 07/24/2019   Influenza 08/22/2014, 09/03/2015, 08/25/2016   Moderna Sars-Covid-2 Vacc 12/06/2019, 01/03/2020, 09/22/2020   Pfizer Covid-19 Vacci 03/03/2021, 08/20/2021   Pneumococcal -13 09/03/2015   Pneumococcal-23 08/25/2016   Td 11/22/2006     Past Surgical History:  Procedure Laterality Date   COLONOSCOPY      COLONOSCOPY W/ POLYPECTOMY     CORONARY ANGIOPLASTY WITH STENT PLACEMENT  04/2008   IR RADIOLOGIST EVAL & MGMT  07/22/2021   IR RADIOLOGIST EVAL & MGMT  10/20/2021   MASS EXCISION Left 10/06/2017   Procedure: EXCISION LEFT AXILLA CYST;  Surgeon: Harriette Bouillon, MD;  Location: [facility];  Service: General;  Laterality: Left;   TOTAL KNEE ARTHROPLASTY Right 09/06/2022   Procedure: RIGHT TOTAL KNEE ARTHROPLASTY;  Surgeon: Gean Birchwood, MD;  Location: [facility];  Service: Orthopedics;  Laterality: Right;    FHx:    Reviewed / unchanged  SHx:    Reviewed / unchanged   Systems Review:  Constitutional: Denies fever, chills, wt changes, headaches, insomnia, fatigue, night sweats, change in appetite. Eyes: Denies redness, blurred vision, diplopia, discharge, itchy, watery eyes.  ENT: Denies discharge, congestion, post nasal drip, epistaxis, sore throat, earache, hearing loss, dental pain, tinnitus, vertigo, sinus pain, snoring.  CV: Denies chest pain, palpitations, irregular heartbeat, syncope, dyspnea, diaphoresis, orthopnea, PND, claudication or edema. Respiratory: denies cough, dyspnea, DOE, pleurisy, hoarseness, laryngitis, wheezing.  Gastrointestinal: Denies dysphagia, odynophagia, heartburn, reflux, water brash, abdominal pain or cramps, nausea, vomiting, bloating, diarrhea, constipation, hematemesis, melena, hematochezia  or hemorrhoids. Genitourinary: Denies dysuria, frequency, urgency, nocturia, hesitancy, discharge, hematuria or flank pain. Musculoskeletal: Denies arthralgias, myalgias, stiffness, jt. swelling, pain, limping or strain/sprain.  Skin: Denies pruritus, rash, hives, warts, acne, eczema or change in skin lesion(s). Neuro: No weakness, tremor, incoordination, spasms, paresthesia or pain. Psychiatric: Denies confusion, memory loss or sensory loss. Endo: Denies change in weight, skin or hair change.  Heme/Lymph: No excessive bleeding, bruising or enlarged lymph  nodes.  Physical Exam  BP 136/64   Pulse Marland Kitchen)  52   Temp 97.9 F (36.6 C)   Resp 17   Ht 5\' 9"  (1.753 m)   Wt 183 lb 3.2 oz (83.1 kg)   SpO2 98%   BMI 27.05 kg/m   Appears  over nourished and in no distress.  Eyes: PERRLA, EOMs, conjunctiva no swelling or erythema. Sinuses: No frontal/maxillary tenderness ENT/Mouth: EAC's clear, TM's nl w/o erythema, bulging. Nares clear w/o erythema, swelling, exudates. Oropharynx clear without erythema or exudates. Oral hygiene is good. Tongue normal, non obstructing. Hearing intact.  Neck: Supple. Thyroid not palpable. Car 2+/2+ without bruits, nodes or JVD. Chest: Respirations nl with BS clear & equal w/o rales, rhonchi, wheezing or stridor.  Cor: Heart sounds normal w/ regular rate and rhythm without sig. murmurs, gallops, clicks or rubs. Peripheral pulses normal and equal  without edema.  Abdomen: Soft & bowel sounds normal. Non-tender w/o guarding, rebound, hernias, masses or organomegaly.  Lymphatics: Unremarkable.  Musculoskeletal: Full ROM all peripheral extremities, joint stability, 5/5 strength and normal gait.  Skin: Warm, dry without exposed rashes, lesions or ecchymosis apparent.  Neuro: Cranial nerves intact, reflexes equal bilaterally. Sensory-motor testing grossly intact. Tendon reflexes grossly intact. ? Masked facies.  Pysch: Alert & oriented x 3.  Insight and judgement nl . No ideations.  Assessment and Plan:  1. Essential hypertension  - Continue medication, monitor blood pressure at home.  - Continue DASH diet.  Reminder to go to the ER if any CP,  SOB, nausea, dizziness, severe HA, changes vision/speech.   - CBC with Differential/Platelet - COMPLETE METABOLIC PANEL WITH GFR - Magnesium - TSH  2. Hyperlipidemia associated with type 2 diabetes mellitus (HCC)  - Continue diet/meds, exercise,& lifestyle modifications.  - Continue monitor periodic cholesterol/liver & renal functions    - Lipid panel - TSH  3. Type 2  diabetes mellitus with stage 3a chronic kidney  disease, without long-term current use of insulin (HCC)  - Continue diet, exercise  - Lifestyle modifications.  - Monitor appropriate labs   - Hemoglobin A1c - Insulin, random  4. Vitamin D deficiency  - Continue supplementation   - VITAMIN D 25 Hydroxy   5. Diet-controlled diabetes mellitus (HCC)  - Hemoglobin A1c - Insulin, random  6. Idiopathic gout  - Uric acid  7. Medication management  - CBC with Differential/Platelet - COMPLETE METABOLIC PANEL WITH GFR - Magnesium - Lipid panel - TSH - Hemoglobin A1c - Insulin, random - VITAMIN D 25 Hydroxy - Uric acid        Discussed  regular exercise, BP monitoring, weight control to achieve/maintain BMI less than 25 and discussed med and SE's. Recommended labs to assess /monitor clinical status .  I discussed the assessment and treatment plan with the patient. The patient was provided an opportunity to ask questions and all were answered. The patient agreed with the plan and demonstrated an understanding of the instructions.  I provided over 30 minutes of exam, counseling, chart review and  complex critical decision making.        The patient was advised to call back or seek an in-person evaluation if the symptoms worsen or if the condition fails to improve as anticipated.   Marinus Maw, MD

## 2022-11-25 ENCOUNTER — Encounter: Payer: Self-pay | Admitting: Internal Medicine

## 2022-11-25 ENCOUNTER — Ambulatory Visit (INDEPENDENT_AMBULATORY_CARE_PROVIDER_SITE_OTHER): Payer: Medicare Other | Admitting: Internal Medicine

## 2022-11-25 VITALS — BP 136/64 | HR 52 | Temp 97.9°F | Resp 17 | Ht 69.0 in | Wt 183.2 lb

## 2022-11-25 DIAGNOSIS — E119 Type 2 diabetes mellitus without complications: Secondary | ICD-10-CM

## 2022-11-25 DIAGNOSIS — N1831 Chronic kidney disease, stage 3a: Secondary | ICD-10-CM

## 2022-11-25 DIAGNOSIS — E559 Vitamin D deficiency, unspecified: Secondary | ICD-10-CM | POA: Diagnosis not present

## 2022-11-25 DIAGNOSIS — E1122 Type 2 diabetes mellitus with diabetic chronic kidney disease: Secondary | ICD-10-CM | POA: Diagnosis not present

## 2022-11-25 DIAGNOSIS — I10 Essential (primary) hypertension: Secondary | ICD-10-CM

## 2022-11-25 DIAGNOSIS — E785 Hyperlipidemia, unspecified: Secondary | ICD-10-CM

## 2022-11-25 DIAGNOSIS — Z79899 Other long term (current) drug therapy: Secondary | ICD-10-CM

## 2022-11-25 DIAGNOSIS — E1169 Type 2 diabetes mellitus with other specified complication: Secondary | ICD-10-CM | POA: Diagnosis not present

## 2022-11-25 DIAGNOSIS — M1000 Idiopathic gout, unspecified site: Secondary | ICD-10-CM

## 2022-11-25 NOTE — Patient Instructions (Signed)

## 2022-11-26 LAB — COMPLETE METABOLIC PANEL WITH GFR
AG Ratio: 1.4 (calc) (ref 1.0–2.5)
ALT: 10 U/L (ref 9–46)
AST: 16 U/L (ref 10–35)
Albumin: 4.2 g/dL (ref 3.6–5.1)
Alkaline phosphatase (APISO): 87 U/L (ref 35–144)
BUN/Creatinine Ratio: 9 (calc) (ref 6–22)
BUN: 15 mg/dL (ref 7–25)
CO2: 26 mmol/L (ref 20–32)
Calcium: 10.9 mg/dL — ABNORMAL HIGH (ref 8.6–10.3)
Chloride: 107 mmol/L (ref 98–110)
Creat: 1.61 mg/dL — ABNORMAL HIGH (ref 0.70–1.22)
Globulin: 2.9 g/dL (calc) (ref 1.9–3.7)
Glucose, Bld: 113 mg/dL (ref 65–139)
Potassium: 3.4 mmol/L — ABNORMAL LOW (ref 3.5–5.3)
Sodium: 143 mmol/L (ref 135–146)
Total Bilirubin: 0.5 mg/dL (ref 0.2–1.2)
Total Protein: 7.1 g/dL (ref 6.1–8.1)
eGFR: 42 mL/min/{1.73_m2} — ABNORMAL LOW (ref >=60)

## 2022-11-26 LAB — URIC ACID: Uric Acid, Serum: 3.7 mg/dL — ABNORMAL LOW (ref 4.0–8.0)

## 2022-11-26 LAB — LIPID PANEL
Cholesterol: 131 mg/dL (ref <200)
HDL: 49 mg/dL (ref >=40)
LDL Cholesterol (Calc): 68 mg/dL (calc)
Non-HDL Cholesterol (Calc): 82 mg/dL (calc) (ref <130)
Total CHOL/HDL Ratio: 2.7 (calc) (ref <5.0)
Triglycerides: 64 mg/dL (ref <150)

## 2022-11-26 LAB — MAGNESIUM: Magnesium: 2.4 mg/dL (ref 1.5–2.5)

## 2022-11-26 LAB — CBC WITH DIFFERENTIAL/PLATELET
Absolute Monocytes: 464 cells/uL (ref 200–950)
Basophils Absolute: 22 cells/uL (ref 0–200)
Basophils Relative: 0.4 %
Eosinophils Absolute: 140 cells/uL (ref 15–500)
Eosinophils Relative: 2.6 %
HCT: 41.1 % (ref 38.5–50.0)
Hemoglobin: 13 g/dL — ABNORMAL LOW (ref 13.2–17.1)
Lymphs Abs: 1912 cells/uL (ref 850–3900)
MCH: 25.4 pg — ABNORMAL LOW (ref 27.0–33.0)
MCHC: 31.6 g/dL — ABNORMAL LOW (ref 32.0–36.0)
MCV: 80.4 fL (ref 80.0–100.0)
MPV: 11.7 fL (ref 7.5–12.5)
Monocytes Relative: 8.6 %
Neutro Abs: 2862 cells/uL (ref 1500–7800)
Neutrophils Relative %: 53 %
Platelets: 187 10*3/uL (ref 140–400)
RBC: 5.11 10*6/uL (ref 4.20–5.80)
RDW: 14.2 % (ref 11.0–15.0)
Total Lymphocyte: 35.4 %
WBC: 5.4 10*3/uL (ref 3.8–10.8)

## 2022-11-26 LAB — HEMOGLOBIN A1C
Hgb A1c MFr Bld: 6 % of total Hgb — ABNORMAL HIGH (ref <5.7)
Mean Plasma Glucose: 126 mg/dL
eAG (mmol/L): 7 mmol/L

## 2022-11-26 LAB — VITAMIN D 25 HYDROXY (VIT D DEFICIENCY, FRACTURES): Vit D, 25-Hydroxy: 58 ng/mL (ref 30–100)

## 2022-11-26 LAB — INSULIN, RANDOM: Insulin: 38.2 u[IU]/mL — ABNORMAL HIGH

## 2022-11-26 LAB — TSH: TSH: 2.22 mIU/L (ref 0.40–4.50)

## 2022-12-24 ENCOUNTER — Emergency Department (HOSPITAL_COMMUNITY): Payer: Medicare Other

## 2022-12-24 ENCOUNTER — Inpatient Hospital Stay (HOSPITAL_COMMUNITY)
Admission: EM | Admit: 2022-12-24 | Discharge: 2022-12-28 | DRG: 398 | Disposition: A | Discharge status: Home / Self Care | Payer: Medicare Other | Attending: Family Medicine | Admitting: Family Medicine

## 2022-12-24 ENCOUNTER — Encounter (HOSPITAL_COMMUNITY): Payer: Self-pay

## 2022-12-24 ENCOUNTER — Other Ambulatory Visit: Payer: Self-pay

## 2022-12-24 DIAGNOSIS — Z955 Presence of coronary angioplasty implant and graft: Secondary | ICD-10-CM | POA: Diagnosis not present

## 2022-12-24 DIAGNOSIS — I10 Essential (primary) hypertension: Secondary | ICD-10-CM | POA: Diagnosis present

## 2022-12-24 DIAGNOSIS — Z87891 Personal history of nicotine dependence: Secondary | ICD-10-CM | POA: Diagnosis not present

## 2022-12-24 DIAGNOSIS — I129 Hypertensive chronic kidney disease with stage 1 through stage 4 chronic kidney disease, or unspecified chronic kidney disease: Secondary | ICD-10-CM | POA: Diagnosis present

## 2022-12-24 DIAGNOSIS — Z923 Personal history of irradiation: Secondary | ICD-10-CM | POA: Diagnosis not present

## 2022-12-24 DIAGNOSIS — M1990 Unspecified osteoarthritis, unspecified site: Secondary | ICD-10-CM | POA: Diagnosis present

## 2022-12-24 DIAGNOSIS — I2510 Atherosclerotic heart disease of native coronary artery without angina pectoris: Secondary | ICD-10-CM | POA: Diagnosis present

## 2022-12-24 DIAGNOSIS — K5900 Constipation, unspecified: Secondary | ICD-10-CM | POA: Diagnosis not present

## 2022-12-24 DIAGNOSIS — Z86010 Personal history of colonic polyps: Secondary | ICD-10-CM

## 2022-12-24 DIAGNOSIS — Z79899 Other long term (current) drug therapy: Secondary | ICD-10-CM

## 2022-12-24 DIAGNOSIS — Z1152 Encounter for screening for COVID-19: Secondary | ICD-10-CM | POA: Diagnosis not present

## 2022-12-24 DIAGNOSIS — K3533 Acute appendicitis with perforation and localized peritonitis, with abscess: Principal | ICD-10-CM | POA: Diagnosis present

## 2022-12-24 DIAGNOSIS — W19XXXA Unspecified fall, initial encounter: Secondary | ICD-10-CM | POA: Diagnosis present

## 2022-12-24 DIAGNOSIS — N1831 Chronic kidney disease, stage 3a: Secondary | ICD-10-CM | POA: Diagnosis present

## 2022-12-24 DIAGNOSIS — R748 Abnormal levels of other serum enzymes: Secondary | ICD-10-CM

## 2022-12-24 DIAGNOSIS — Z8546 Personal history of malignant neoplasm of prostate: Secondary | ICD-10-CM | POA: Diagnosis not present

## 2022-12-24 DIAGNOSIS — T796XXA Traumatic ischemia of muscle, initial encounter: Secondary | ICD-10-CM

## 2022-12-24 DIAGNOSIS — M6282 Rhabdomyolysis: Secondary | ICD-10-CM | POA: Diagnosis present

## 2022-12-24 DIAGNOSIS — F4310 Post-traumatic stress disorder, unspecified: Secondary | ICD-10-CM | POA: Diagnosis present

## 2022-12-24 DIAGNOSIS — E119 Type 2 diabetes mellitus without complications: Secondary | ICD-10-CM

## 2022-12-24 DIAGNOSIS — E1122 Type 2 diabetes mellitus with diabetic chronic kidney disease: Secondary | ICD-10-CM | POA: Diagnosis present

## 2022-12-24 DIAGNOSIS — F32A Depression, unspecified: Secondary | ICD-10-CM | POA: Diagnosis present

## 2022-12-24 DIAGNOSIS — E785 Hyperlipidemia, unspecified: Secondary | ICD-10-CM | POA: Diagnosis present

## 2022-12-24 DIAGNOSIS — E876 Hypokalemia: Secondary | ICD-10-CM | POA: Diagnosis not present

## 2022-12-24 DIAGNOSIS — Z96651 Presence of right artificial knee joint: Secondary | ICD-10-CM | POA: Diagnosis present

## 2022-12-24 DIAGNOSIS — K3580 Unspecified acute appendicitis: Principal | ICD-10-CM | POA: Diagnosis present

## 2022-12-24 DIAGNOSIS — Z8249 Family history of ischemic heart disease and other diseases of the circulatory system: Secondary | ICD-10-CM

## 2022-12-24 DIAGNOSIS — Z841 Family history of disorders of kidney and ureter: Secondary | ICD-10-CM | POA: Diagnosis not present

## 2022-12-24 DIAGNOSIS — Z7982 Long term (current) use of aspirin: Secondary | ICD-10-CM | POA: Diagnosis not present

## 2022-12-24 DIAGNOSIS — K353 Acute appendicitis with localized peritonitis, without perforation or gangrene: Secondary | ICD-10-CM | POA: Diagnosis not present

## 2022-12-24 LAB — RESP PANEL BY RT-PCR (RSV, FLU A&B, COVID)  RVPGX2
Influenza A by PCR: NEGATIVE
Influenza B by PCR: NEGATIVE
Resp Syncytial Virus by PCR: NEGATIVE
SARS Coronavirus 2 by RT PCR: NEGATIVE

## 2022-12-24 LAB — URINALYSIS, ROUTINE W REFLEX MICROSCOPIC
Bilirubin Urine: NEGATIVE
Glucose, UA: NEGATIVE mg/dL
Ketones, ur: NEGATIVE mg/dL
Leukocytes,Ua: NEGATIVE
Nitrite: NEGATIVE
Protein, ur: 100 mg/dL — AB
Specific Gravity, Urine: 1.011 (ref 1.005–1.030)
pH: 6 (ref 5.0–8.0)

## 2022-12-24 LAB — COMPREHENSIVE METABOLIC PANEL
ALT: 34 U/L (ref 0–44)
AST: 54 U/L — ABNORMAL HIGH (ref 15–41)
Albumin: 3.2 g/dL — ABNORMAL LOW (ref 3.5–5.0)
Alkaline Phosphatase: 70 U/L (ref 38–126)
Anion gap: 10 (ref 5–15)
BUN: 10 mg/dL (ref 8–23)
CO2: 25 mmol/L (ref 22–32)
Calcium: 10.1 mg/dL (ref 8.9–10.3)
Chloride: 103 mmol/L (ref 98–111)
Creatinine, Ser: 1.56 mg/dL — ABNORMAL HIGH (ref 0.61–1.24)
GFR, Estimated: 44 mL/min — ABNORMAL LOW (ref >60)
Glucose, Bld: 126 mg/dL — ABNORMAL HIGH (ref 70–99)
Potassium: 2.9 mmol/L — ABNORMAL LOW (ref 3.5–5.1)
Sodium: 138 mmol/L (ref 135–145)
Total Bilirubin: 1 mg/dL (ref 0.3–1.2)
Total Protein: 6.6 g/dL (ref 6.5–8.1)

## 2022-12-24 LAB — CBC WITH DIFFERENTIAL/PLATELET
Abs Immature Granulocytes: 0.04 10*3/uL (ref 0.00–0.07)
Basophils Absolute: 0 10*3/uL (ref 0.0–0.1)
Basophils Relative: 0 %
Eosinophils Absolute: 0.1 10*3/uL (ref 0.0–0.5)
Eosinophils Relative: 1 %
HCT: 45.8 % (ref 39.0–52.0)
Hemoglobin: 14.3 g/dL (ref 13.0–17.0)
Immature Granulocytes: 0 %
Lymphocytes Relative: 13 %
Lymphs Abs: 1.2 10*3/uL (ref 0.7–4.0)
MCH: 25 pg — ABNORMAL LOW (ref 26.0–34.0)
MCHC: 31.2 g/dL (ref 30.0–36.0)
MCV: 80.2 fL (ref 80.0–100.0)
Monocytes Absolute: 1 10*3/uL (ref 0.1–1.0)
Monocytes Relative: 11 %
Neutro Abs: 6.7 10*3/uL (ref 1.7–7.7)
Neutrophils Relative %: 75 %
Platelets: 160 10*3/uL (ref 150–400)
RBC: 5.71 MIL/uL (ref 4.22–5.81)
RDW: 15.7 % — ABNORMAL HIGH (ref 11.5–15.5)
WBC: 9 10*3/uL (ref 4.0–10.5)
nRBC: 0 % (ref 0.0–0.2)

## 2022-12-24 LAB — CK: Total CK: 1514 U/L — ABNORMAL HIGH (ref 49–397)

## 2022-12-24 LAB — MAGNESIUM: Magnesium: 2.3 mg/dL (ref 1.7–2.4)

## 2022-12-24 MED ORDER — ONDANSETRON HCL 4 MG PO TABS: 4.0000 mg | ORAL_TABLET | Freq: Four times a day (QID) | ORAL | Status: DC | PRN

## 2022-12-24 MED ORDER — IOHEXOL 350 MG/ML SOLN
75.0000 mL | Freq: Once | INTRAVENOUS | Status: AC | PRN
  Administered 2022-12-24: 75 mL via INTRAVENOUS

## 2022-12-24 MED ORDER — ONDANSETRON HCL 4 MG/2ML IJ SOLN: 4.0000 mg | Freq: Four times a day (QID) | INTRAMUSCULAR | Status: DC | PRN

## 2022-12-24 MED ORDER — SODIUM CHLORIDE 0.9 % IV SOLN
2.0000 g | Freq: Once | INTRAVENOUS | Status: AC
  Administered 2022-12-24: 2 g via INTRAVENOUS
  Filled 2022-12-24: qty 20

## 2022-12-24 MED ORDER — ACETAMINOPHEN 650 MG RE SUPP: 650.0000 mg | Freq: Four times a day (QID) | RECTAL | Status: DC | PRN

## 2022-12-24 MED ORDER — METRONIDAZOLE 500 MG/100ML IV SOLN
500.0000 mg | Freq: Once | INTRAVENOUS | Status: AC
  Administered 2022-12-24: 500 mg via INTRAVENOUS
  Filled 2022-12-24: qty 100

## 2022-12-24 MED ORDER — SODIUM CHLORIDE 0.9 % IV SOLN: INTRAVENOUS | Status: AC

## 2022-12-24 MED ORDER — METRONIDAZOLE 500 MG/100ML IV SOLN
500.0000 mg | Freq: Two times a day (BID) | INTRAVENOUS | Status: DC
  Administered 2022-12-25 – 2022-12-28 (×7): 500 mg via INTRAVENOUS
  Filled 2022-12-24 (×7): qty 100

## 2022-12-24 MED ORDER — SODIUM CHLORIDE 0.9 % IV SOLN
2.0000 g | INTRAVENOUS | Status: DC
  Administered 2022-12-25 – 2022-12-27 (×3): 2 g via INTRAVENOUS
  Filled 2022-12-24 (×3): qty 20

## 2022-12-24 MED ORDER — HEPARIN SODIUM (PORCINE) 5000 UNIT/ML IJ SOLN
5000.0000 [IU] | Freq: Three times a day (TID) | INTRAMUSCULAR | Status: DC
  Administered 2022-12-25 – 2022-12-28 (×9): 5000 [IU] via SUBCUTANEOUS
  Filled 2022-12-24 (×9): qty 1

## 2022-12-24 MED ORDER — ACETAMINOPHEN 325 MG PO TABS
650.0000 mg | ORAL_TABLET | Freq: Four times a day (QID) | ORAL | Status: DC | PRN
  Administered 2022-12-25: 650 mg via ORAL
  Filled 2022-12-24: qty 2

## 2022-12-24 MED ORDER — POTASSIUM CHLORIDE CRYS ER 20 MEQ PO TBCR
40.0000 meq | EXTENDED_RELEASE_TABLET | Freq: Once | ORAL | Status: AC
  Administered 2022-12-24: 40 meq via ORAL
  Filled 2022-12-24: qty 2

## 2022-12-24 MED ORDER — ALBUTEROL SULFATE (2.5 MG/3ML) 0.083% IN NEBU: 2.5000 mg | INHALATION_SOLUTION | RESPIRATORY_TRACT | Status: DC | PRN

## 2022-12-24 MED ORDER — HYDRALAZINE HCL 20 MG/ML IJ SOLN
10.0000 mg | Freq: Once | INTRAMUSCULAR | Status: AC
  Administered 2022-12-24: 10 mg via INTRAVENOUS
  Filled 2022-12-24: qty 1

## 2022-12-24 MED ORDER — SODIUM CHLORIDE 0.9 % IV BOLUS
500.0000 mL | Freq: Once | INTRAVENOUS | Status: AC
  Administered 2022-12-24: 500 mL via INTRAVENOUS

## 2022-12-24 MED ORDER — LOSARTAN POTASSIUM 50 MG PO TABS
100.0000 mg | ORAL_TABLET | Freq: Every day | ORAL | Status: DC
  Administered 2022-12-24 – 2022-12-28 (×4): 100 mg via ORAL
  Filled 2022-12-24 (×4): qty 2

## 2022-12-24 NOTE — ED Provider Notes (Signed)
[facility] Provider Note   CSN: [id number] Arrival date & time: 12/24/22  1413     History {Add pertinent medical, surgical, social history, OB history to HPI:1} Chief Complaint  Patient presents with   Fall    From home, fell this morning at 0530 unable to get off floor until wife called daughter this atfernoon. Called EMS d/t "seeming more confused" Pt denies pain. Delayed in answering    ZAKHI DUPRE is a 82 y.o. male.   Fall   83 year old male presents emergency department after found on the ground.  Wife is with patient who is [employer] historian.  States that patient went to bed and the bed beside her but was found around 5 AM lying on the ground beside the bed.  Patient is unaware of how he ended up on the ground but was unable to get up unassisted.  Patient went to bed around 8 or 9:00 last night and was on the ground until around 2 PM this afternoon; patient and wife unaware of when exactly he started on the ground.  Wife was unable to help patient up prompting her to call EMS for assistance.  Patient states that he has been at his baseline while conscious last night and upon awakening this morning.  Complaining of no pain from the fall.  Patient does state that he was having some lower abdominal pain 2 days prior to last night.  Denies visual disturbance, gait abnormality, slurred speech, facial droop, weakness/sensory deficits in upper or lower extremities.  Patient is on no blood thinner.  Denies headache, dizziness, lightheadedness, chest pain, shortness of breath, nausea, vomiting, urinary symptoms, change in bowel habits.  Past medical history significant for adenocarcinoma of prostate, CAD, hypertension, hyperglycemia, hyperlipidemia, CKD 3, diabetes mellitus type 2, hyperparathyroidism  Home Medications Prior to Admission medications   Medication Sig Start Date End Date Taking? Authorizing Provider  allopurinol (ZYLOPRIM) 300 MG  tablet Take 300 mg by mouth daily.    [provider]  Ascorbic Acid (VITAMIN C) 1000 MG tablet Take 1,000 mg by mouth daily.    [provider]  aspirin 81 MG tablet Take 81 mg by mouth daily.    [provider]  cetirizine (ZYRTEC) 10 MG tablet Take 10 mg by mouth daily in the afternoon.    [provider]  diphenhydramine-acetaminophen (TYLENOL PM) 25-500 MG TABS tablet Take 2 tablets by mouth at bedtime as needed (sleep/pain).    [provider]  escitalopram (LEXAPRO) 10 MG tablet Take 10 mg by mouth every evening. 03/06/21   [provider]  glucose blood (ONETOUCH VERIO) test strip 1 each by Other route daily in the afternoon. Use as instructed 03/29/22   Shamleffer, Melanie Crazier, MD  losartan (COZAAR) 100 MG tablet Take 100 mg by mouth daily. 03/06/21   [provider]  LUBRICATING PLUS EYE DROPS 0.5 % SOLN Apply 1 drop to eye 2 (two) times daily as needed (dry eyes). 11/10/20   [provider]  nitroGLYCERIN (NITROSTAT) 0.4 MG SL tablet Place 0.4 mg under the tongue every 5 (five) minutes as needed for chest pain.    [provider]  potassium chloride SA (KLOR-CON) 20 MEQ tablet Take    1 tablet      2 x /day      for Potassium Patient taking differently: Take 20 mEq by mouth 2 (two) times daily. 08/29/20   Unk Pinto, MD  propranolol Alphia Moh)  10 MG tablet Take 10 mg by mouth as needed (anxiety).    [provider]  pyridOXINE (VITAMIN B-6) 50 MG tablet Take 50 mg by mouth daily.      [provider]  risperiDONE (RISPERDAL) 2 MG tablet Take 1 mg by mouth at bedtime.    [provider]  rosuvastatin (CRESTOR) 40 MG tablet Take  1 tablet   Daily  for Cholesterol 11/29/21   Unk Pinto, MD  tamsulosin (FLOMAX) 0.4 MG CAPS capsule Take 0.4 mg by mouth at bedtime.    [provider]  tizanidine (ZANAFLEX) 2 MG capsule Take 1 capsule (2 mg total) by mouth 3 (three) times  daily as needed for muscle spasms. Patient not taking: Reported on 11/25/2022 09/06/22   Frederik Pear, MD      Allergies    Patient has no known allergies.    Review of Systems   Review of Systems  All other systems reviewed and are negative.   Physical Exam Updated Vital Signs BP (!) 176/85 (BP Location: Right Arm)   Pulse 61   Temp 98.1 F (36.7 C) (Oral)   Resp 16   SpO2 92%  Physical Exam Vitals and nursing note reviewed.  Constitutional:      General: He is not in acute distress.    Appearance: He is well-developed.  HENT:     Head: Normocephalic and atraumatic.  Eyes:     Conjunctiva/sclera: Conjunctivae normal.  Cardiovascular:     Rate and Rhythm: Normal rate and regular rhythm.     Heart sounds: No murmur heard. Pulmonary:     Effort: Pulmonary effort is normal. No respiratory distress.     Breath sounds: Normal breath sounds.  Abdominal:     Palpations: Abdomen is soft.     Tenderness: There is abdominal tenderness.     Comments: Mild suprapubic tenderness to palpation.  Mild right lower quadrant tenderness.  No overlying skin abnormalities appreciated.  No flank tenderness bilaterally.  Musculoskeletal:        General: No swelling.     Cervical back: Neck supple.     Comments: No midline tenderness of cervical, thoracic, lumbar spine with no obvious step-off or deformity.  No obvious trauma to head appreciated.  No chest wall tenderness.  No pelvic tenderness.  No tender to palpation of upper or lower extremities.  Patient moves all 4 extremities without difficulty.  Skin:    General: Skin is warm and dry.     Capillary Refill: Capillary refill takes less than 2 seconds.  Neurological:     Mental Status: He is alert.     Comments: Alert and oriented to self, place, time and event.   Speech is fluent, clear without dysarthria or dysphasia.   Strength 5/5 in upper/lower extremities   Sensation intact in upper/lower extremities   Normal finger-to-nose and  feet tapping.  CN I not tested  CN II not tested CN III, IV, VI PERRLA and EOMs intact bilaterally  CN V Intact sensation to sharp and light touch to the face  CN VII facial movements symmetric  CN VIII not tested  CN IX, X no uvula deviation, symmetric rise of soft palate  CN XI 5/5 SCM and trapezius strength bilaterally  CN XII Midline tongue protrusion, symmetric L/R movements     Psychiatric:        Mood and Affect: Mood normal.     ED Results / Procedures / Treatments   Labs (all labs  ordered are listed, but only abnormal results are displayed) Labs Reviewed  RESP PANEL BY RT-PCR (RSV, FLU A&B, COVID)  [id number]  COMPREHENSIVE METABOLIC PANEL  CBC WITH DIFFERENTIAL/PLATELET  URINALYSIS, ROUTINE W REFLEX MICROSCOPIC    EKG None  Radiology No results found.  Procedures Procedures  {Document cardiac monitor, telemetry assessment procedure when appropriate:1}  Medications Ordered in ED Medications - No data to display  ED Course/ Medical Decision Making/ A&P Clinical Course as of 12/24/22 1901  Ludwig Clarks Dec 24, 2022  1812 Talk to daughter at beside who stated that patient may have taken more of his nighttime meds than usual.  Patient confirmed and states that he may have taken more Lexapro than intended to last night. [CR]  1859 Patient's blood pressure contents elevated.  Will begin at home Cozaar. [CR]    Clinical Course User Index [CR] Wilnette Kales, PA   {   Click here for ABCD2, HEART and other calculatorsREFRESH Note before signing :1}                          Medical Decision Making Amount and/or Complexity of Data Reviewed Labs: ordered. Radiology: ordered.  Risk Prescription drug management. Decision regarding hospitalization.   This patient presents to the ED for concern of fall, this involves an extensive number of treatment options, and is a complaint that carries with it a high risk of complications and morbidity.  The differential diagnosis  includes CVA, rhabdomyolysis, fracture, strain chest pain, dislocation, solid organ damage, spinal cord injury, appendicitis, serotonin syndrome   Co morbidities that complicate the patient evaluation  See HPI   Additional history obtained:  Additional history obtained from EMR External records from outside source obtained and reviewed including hospital records   Lab Tests:  I Ordered, and personally interpreted labs.  The pertinent results include: No leukocytosis.  No evidence of anemia.  Platelets within range.  Mild hypokalemia with potassium 2.9 of which was supplemented orally with normal magnesium.  Mild elevation of AST of 54.  Patient with baseline renal function with GFR 44, BUN of 10 and creatinine 1.56.  UA significant for rare bacteria, 6-10 WBC with no nitrite or leukocyte.  CK elevated of 1514.  Respiratory viral panel negative.   Imaging Studies ordered:  I ordered imaging studies including CT head/C-spine, CT abdomen pelvis, chest x-ray, pelvis x-ray I independently visualized and interpreted imaging which showed  CT head/C-spine: No acute intracranial abnormality.  Mild diffuse atrophy and mild chronic small vessel ischemic changes.  No acute fracture or subluxation of cervical spine.  Moderate multilevel disc space height loss and ankylosis throughout.  Posterior bridging osteophytosis at C3-C5. CT abdomen pelvis: No CT evidence of acute traumatic injury to abdomen or pelvis.  Wall thickening and fat stranding about appendix; measuring up to 0.9 cm in caliber.  Cholelithiasis.  Prostate brachytherapy.  Coronary artery disease.  Aortic atherosclerosis. Chest x-ray: No acute cardiopulmonary abnormality  Pelvic x-ray: No acute osseous abnormality I agree with the radiologist interpretation  Cardiac Monitoring: / EKG:  The patient was maintained on a cardiac monitor.  I personally viewed and interpreted the cardiac monitored which showed an underlying rhythm of: Sinus  rhythm without acute ischemic changes.  PACs noted.  Elongation of QT   Consultations Obtained:  I requested consultation with the ***,  and discussed lab and imaging findings as well as pertinent plan - they recommend: ***   Problem List / ED Course /  Critical interventions / Medication management  *** I ordered medication including ***  for ***  Reevaluation of the patient after these medicines showed that the patient {resolved/improved/worsened:23923::"improved"} I have reviewed the patients home medicines and have made adjustments as needed   Social Determinants of Health:  ***   Test / Admission - Considered:  Vitals signs significant for ***. Otherwise within normal range and stable throughout visit. Laboratory/imaging studies significant for: *** *** Treatment plan discussed at length with patient and family and they acknowledge understanding were agreeable to said plan.  Appropriate consultations were made while emergency department as depicted in ED course.  Patient stable upon admission to the hospital.   {Document critical care time when appropriate:1} {Document review of labs and clinical decision tools ie heart score, Chads2Vasc2 etc:1}  {Document your independent review of radiology images, and any outside records:1} {Document your discussion with family members, caretakers, and with consultants:1} {Document social determinants of health affecting pt's care:1} {Document your decision making why or why not admission, treatments were needed:1} Final Clinical Impression(s) / ED Diagnoses Final diagnoses:  None    Rx / DC Orders ED Discharge Orders     None

## 2022-12-24 NOTE — ED Notes (Signed)
Shift report received, assumed care at this time.

## 2022-12-24 NOTE — Consult Note (Signed)
Consulting Physician: [place]  Referring Provider: Dion Saucier, PA  Chief Complaint: Found Down  Reason for Consult: Appendicitis   Subjective   HPI: [name] Colon is an 82 y.o. male who was found down after falling around 11 AM this morning.  His family found him this afternoon and called EMS for patient to be transported to the hospital.  He says he hasn't been feeling well for the last few days.  Drinking a lot of water.  Pain in the right lower abdomen.  He has been confused some with other providers but appears to be thinking clearly with me.  Past Medical History:  Diagnosis Date   Adenocarcinoma of prostate ([place]) 2004   s/p seed implantation   Arthritis    Axillary abscess    left   CAD (coronary artery disease)    Diverticulitis, colon    History of colonic polyps    HTN (hypertension)    Hyperglycemia    diet controlled   Hyperlipidemia    Pre-diabetes    Radiation proctitis    with chronic bleeding    Past Surgical History:  Procedure Laterality Date   COLONOSCOPY     COLONOSCOPY W/ POLYPECTOMY     CORONARY ANGIOPLASTY WITH STENT PLACEMENT  04/2008   IR RADIOLOGIST EVAL & MGMT  07/22/2021   IR RADIOLOGIST EVAL & MGMT  10/20/2021   MASS EXCISION Left 10/06/2017   Procedure: EXCISION LEFT AXILLA CYST;  Surgeon: Erroll Luna, MD;  Location: [facility];  Service: General;  Laterality: Left;   TOTAL KNEE ARTHROPLASTY Right 09/06/2022   Procedure: RIGHT TOTAL KNEE ARTHROPLASTY;  Surgeon: Frederik Pear, MD;  Location: [facility];  Service: Orthopedics;  Laterality: Right;    Family History  Problem Relation Age of Onset   Heart disease Father    Hypertension Father    Hypotension Mother    Kidney disease Brother    Colon cancer Neg Hx     Social:  reports that he quit smoking about 30 years ago. His smoking use included cigarettes. He has never used smokeless tobacco. He reports that he does not drink alcohol and does not use  drugs.  Allergies: No Known Allergies  Medications: Current Outpatient Medications  Medication Instructions   allopurinol (ZYLOPRIM) 300 mg, Oral, Daily   aspirin 81 mg, Daily   cetirizine (ZYRTEC) 10 mg, Oral, Daily   diphenhydramine-acetaminophen (TYLENOL PM) 25-500 MG TABS tablet 2 tablets, Oral, At bedtime PRN   escitalopram (LEXAPRO) 10 mg, Oral, Every evening   glucose blood (ONETOUCH VERIO) test strip 1 each, Other, Daily, Use as instructed   losartan (COZAAR) 100 mg, Oral, Daily   LUBRICATING PLUS EYE DROPS 0.5 % SOLN 1 drop, Ophthalmic, 2 times daily PRN   nitroGLYCERIN (NITROSTAT) 0.4 mg, Sublingual, Every 5 min PRN,     potassium chloride SA (KLOR-CON) 20 MEQ tablet Take    1 tablet      2 x /day      for Potassium   propranolol (INDERAL) 10 mg, Oral, As needed   pyridOXINE (VITAMIN B6) 50 mg, Daily   risperiDONE (RISPERDAL) 1 mg, Oral, Daily at bedtime   rosuvastatin (CRESTOR) 40 MG tablet Take  1 tablet   Daily  for Cholesterol   tamsulosin (FLOMAX) 0.4 mg, Oral, Daily at bedtime,     tizanidine (ZANAFLEX) 2 mg, Oral, 3 times daily PRN   vitamin C 1,000 mg, Oral, Daily    ROS - all of the  below systems have been reviewed with the patient and positives are indicated with bold text General: chills, fever or night sweats Eyes: blurry vision or double vision ENT: epistaxis or sore throat Allergy/Immunology: itchy/watery eyes or nasal congestion Hematologic/Lymphatic: bleeding problems, blood clots or swollen lymph nodes Endocrine: temperature intolerance or unexpected weight changes Breast: new or changing breast lumps or nipple discharge Resp: cough, shortness of breath, or wheezing CV: chest pain or dyspnea on exertion GI: as per HPI GU: dysuria, trouble voiding, or hematuria MSK: joint pain or joint stiffness Neuro: TIA or stroke symptoms Derm: pruritus and skin lesion changes Psych: anxiety and depression  Objective   PE Blood pressure (!) 203/98, pulse 65,  temperature 98.3 F (36.8 C), temperature source Oral, resp. rate 19, SpO2 99 %. Constitutional: NAD; conversant; no deformities Eyes: Moist conjunctiva; no lid lag; anicteric; PERRL Neck: Trachea midline; no thyromegaly Lungs: Normal respiratory effort; no tactile fremitus CV: RRR; no palpable thrills; no pitting edema GI: Abd soft, tender right lwoer quadrant; no palpable hepatosplenomegaly MSK: Normal range of motion of extremities; no clubbing/cyanosis Psychiatric: Appropriate affect; alert and oriented x3 Lymphatic: No palpable cervical or axillary lymphadenopathy  Results for orders placed or performed during the hospital encounter of 12/24/22 (from the past 24 hour(s))  Comprehensive metabolic panel     Status: Abnormal   Collection Time: 12/24/22  2:45 PM  Result Value Ref Range   Sodium 138 135 - 145 mmol/L   Potassium 2.9 (L) 3.5 - 5.1 mmol/L   Chloride 103 98 - 111 mmol/L   CO2 25 22 - 32 mmol/L   Glucose, Bld 126 (H) 70 - 99 mg/dL   BUN 10 8 - 23 mg/dL   Creatinine, Ser 1.56 (H) 0.61 - 1.24 mg/dL   Calcium 10.1 8.9 - 10.3 mg/dL   Total Protein 6.6 6.5 - 8.1 g/dL   Albumin 3.2 (L) 3.5 - 5.0 g/dL   AST 54 (H) 15 - 41 U/L   ALT 34 0 - 44 U/L   Alkaline Phosphatase 70 38 - 126 U/L   Total Bilirubin 1.0 0.3 - 1.2 mg/dL   GFR, Estimated 44 (L) >60 mL/min   Anion gap 10 5 - 15  CBC with Differential     Status: Abnormal   Collection Time: 12/24/22  2:45 PM  Result Value Ref Range   WBC 9.0 4.0 - 10.5 K/uL   RBC 5.71 4.22 - 5.81 MIL/uL   Hemoglobin 14.3 13.0 - 17.0 g/dL   HCT 45.8 39.0 - 52.0 %   MCV 80.2 80.0 - 100.0 fL   MCH 25.0 (L) 26.0 - 34.0 pg   MCHC 31.2 30.0 - 36.0 g/dL   RDW 15.7 (H) 11.5 - 15.5 %   Platelets 160 150 - 400 K/uL   nRBC 0.0 0.0 - 0.2 %   Neutrophils Relative % 75 %   Neutro Abs 6.7 1.7 - 7.7 K/uL   Lymphocytes Relative 13 %   Lymphs Abs 1.2 0.7 - 4.0 K/uL   Monocytes Relative 11 %   Monocytes Absolute 1.0 0.1 - 1.0 K/uL   Eosinophils  Relative 1 %   Eosinophils Absolute 0.1 0.0 - 0.5 K/uL   Basophils Relative 0 %   Basophils Absolute 0.0 0.0 - 0.1 K/uL   Immature Granulocytes 0 %   Abs Immature Granulocytes 0.04 0.00 - 0.07 K/uL  CK     Status: Abnormal   Collection Time: 12/24/22  2:45 PM  Result Value Ref Range  Total CK 1,514 (H) 49 - 397 U/L  Resp panel by RT-PCR (RSV, Flu A&B, Covid) Urine, Clean Catch     Status: None   Collection Time: 12/24/22  2:46 PM   Specimen: Urine, Clean Catch; Nasal Swab  Result Value Ref Range   SARS Coronavirus 2 by RT PCR NEGATIVE NEGATIVE   Influenza A by PCR NEGATIVE NEGATIVE   Influenza B by PCR NEGATIVE NEGATIVE   Resp Syncytial Virus by PCR NEGATIVE NEGATIVE  Magnesium     Status: None   Collection Time: 12/24/22  3:07 PM  Result Value Ref Range   Magnesium 2.3 1.7 - 2.4 mg/dL  Urinalysis, Routine w reflex microscopic -Urine, Clean Catch     Status: Abnormal   Collection Time: 12/24/22  3:38 PM  Result Value Ref Range   Color, Urine YELLOW YELLOW   APPearance HAZY (A) CLEAR   Specific Gravity, Urine 1.011 1.005 - 1.030   pH 6.0 5.0 - 8.0   Glucose, UA NEGATIVE NEGATIVE mg/dL   Hgb urine dipstick LARGE (A) NEGATIVE   Bilirubin Urine NEGATIVE NEGATIVE   Ketones, ur NEGATIVE NEGATIVE mg/dL   Protein, ur 100 (A) NEGATIVE mg/dL   Nitrite NEGATIVE NEGATIVE   Leukocytes,Ua NEGATIVE NEGATIVE   RBC / HPF 0-5 0 - 5 RBC/hpf   WBC, UA 6-10 0 - 5 WBC/hpf   Bacteria, UA RARE (A) NONE SEEN   Squamous Epithelial / HPF 0-5 0 - 5 /HPF   Mucus PRESENT    Hyaline Casts, UA PRESENT      Imaging Orders         DG Chest 2 View         CT Head Wo Contrast         CT Cervical Spine Wo Contrast         DG Pelvis 1-2 Views         CT Abdomen Pelvis W Contrast     1. No CT evidence of acute traumatic injury to the abdomen or pelvis. 2. Wall thickening and fat stranding about the appendix, which is fluid-filled and measures up to 0.9 cm in caliber. Findings are consistent with  acute appendicitis. No evidence of complicating abscess or perforation. 3. Cholelithiasis. 4. Prostate brachytherapy. 5. Coronary artery disease.      Assessment and Plan   [name] Colon is an 82 y.o. male with acute appendicitis.  He was found down with some elevation in his CK, he is hypokalemic, he has a normal white blood cell count.  This is a somewhat atypical presentation for appendicitis.  I recommend admission to the medicine service, initiating antibiotic treatment for appendicitis, and considering laparoscopic appendectomy once he is medically optimized for surgery.  He has no appendicolith on CT and would be a candidate for antibiotic treatment for appendicitis if he is not able to be optimized.  The patient stated he would rather just have surgery if given the option.    ICD-10-CM   1. Acute appendicitis, unspecified acute appendicitis type  K35.80     2. Elevated CK  R74.8        Felicie Morn, [facility] Use [email or web address] to contact on call provider  New Patient Billing: [phone] - High MDM

## 2022-12-24 NOTE — H&P (Signed)
History and Physical    [name] Colon ZDG:[id number] DOB: 07-31-1941 DOA: 12/24/2022  PCP: Unk Pinto, MD  Patient coming from: home   I have personally briefly reviewed patient's old medical records in [place]  Chief Complaint: found down,  3 days right lower abd pain  HPI: [name] Colon is a 82 y.o. male with medical history significant of  Adenocarcinoma of prostate s/p radiation seeds, CAD s/p stent, essential hypertension, DMII diet controlled,  PTSD/anxiety /depression, who gets care at [facility] presents to ED s/p being found down at home. Per patient unable to give full history of how he fell. However he notes he was too weak get up. He notes for the past 3 days he has been ill with right lower quadrant pain and global weakness unable to get out of bed He also noted low appetite x 1 week but states he was still able to drink and eat. He also note constipation x 1 week as well.  He denies fever/chills/ n/v/dysuria/ sob/ chest pain  or dysuria.    ED Course:  Afeb, BP 176/85, rr16, pul 61  Wbc 9hgb 14.3,  plt 160 ,  NA138, K 2.9, glu 126, cr 1.56 at baseline ( 1.6-1.3)\ CK 1514 Resp panel neg  Cxr: NAD  Pelvis: NAD  EKG: snr /pac WT 500 UA large hgb no rbc CTH NAD Cervical spine . No fracture or static subluxation of the cervical spine. 2. Moderate multilevel disc space height loss and ankylosis throughout. 3. 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 MRI may be used to assess for central cervical canal pathology if desired. Tx potassium 40 meq  nas 500cc  Tx ctx/metroniazole Review of Systems: As per HPI otherwise 10 point review of systems negative.   Past Medical History:  Diagnosis Date   Adenocarcinoma of prostate ([place]) 2004   s/p seed implantation   Arthritis    Axillary abscess    left   CAD (coronary artery disease)    Diverticulitis, colon    History of colonic polyps    HTN (hypertension)     Hyperglycemia    diet controlled   Hyperlipidemia    Pre-diabetes    Radiation proctitis    with chronic bleeding    Past Surgical History:  Procedure Laterality Date   COLONOSCOPY     COLONOSCOPY W/ POLYPECTOMY     CORONARY ANGIOPLASTY WITH STENT PLACEMENT  04/2008   IR RADIOLOGIST EVAL & MGMT  07/22/2021   IR RADIOLOGIST EVAL & MGMT  10/20/2021   MASS EXCISION Left 10/06/2017   Procedure: EXCISION LEFT AXILLA CYST;  Surgeon: Erroll Luna, MD;  Location: [facility];  Service: General;  Laterality: Left;   TOTAL KNEE ARTHROPLASTY Right 09/06/2022   Procedure: RIGHT TOTAL KNEE ARTHROPLASTY;  Surgeon: Frederik Pear, MD;  Location: [facility];  Service: Orthopedics;  Laterality: Right;     reports that he quit smoking about 30 years ago. His smoking use included cigarettes. He has never used smokeless tobacco. He reports that he does not drink alcohol and does not use drugs.  No Known Allergies  Family History  Problem Relation Age of Onset   Heart disease Father    Hypertension Father    Hypotension Mother    Kidney disease Brother    Colon cancer Neg Hx     Prior to Admission medications   Medication Sig Start Date End Date Taking? Authorizing Provider  allopurinol (ZYLOPRIM)  300 MG tablet Take 300 mg by mouth daily.    [provider]  Ascorbic Acid (VITAMIN C) 1000 MG tablet Take 1,000 mg by mouth daily.    [provider]  aspirin 81 MG tablet Take 81 mg by mouth daily.    [provider]  cetirizine (ZYRTEC) 10 MG tablet Take 10 mg by mouth daily in the afternoon.    [provider]  diphenhydramine-acetaminophen (TYLENOL PM) 25-500 MG TABS tablet Take 2 tablets by mouth at bedtime as needed (sleep/pain).    [provider]  escitalopram (LEXAPRO) 10 MG tablet Take 10 mg by mouth every evening. 03/06/21   [provider]  glucose blood (ONETOUCH VERIO) test strip 1 each by Other route daily in the afternoon.  Use as instructed 03/29/22   Shamleffer, Melanie Crazier, MD  losartan (COZAAR) 100 MG tablet Take 100 mg by mouth daily. 03/06/21   [provider]  LUBRICATING PLUS EYE DROPS 0.5 % SOLN Apply 1 drop to eye 2 (two) times daily as needed (dry eyes). 11/10/20   [provider]  nitroGLYCERIN (NITROSTAT) 0.4 MG SL tablet Place 0.4 mg under the tongue every 5 (five) minutes as needed for chest pain.    [provider]  potassium chloride SA (KLOR-CON) 20 MEQ tablet Take    1 tablet      2 x /day      for Potassium Patient taking differently: Take 20 mEq by mouth 2 (two) times daily. 08/29/20   Unk Pinto, MD  propranolol (INDERAL) 10 MG tablet Take 10 mg by mouth as needed (anxiety).    [provider]  pyridOXINE (VITAMIN B-6) 50 MG tablet Take 50 mg by mouth daily.      [provider]  risperiDONE (RISPERDAL) 2 MG tablet Take 1 mg by mouth at bedtime.    [provider]  rosuvastatin (CRESTOR) 40 MG tablet Take  1 tablet   Daily  for Cholesterol Patient taking differently: Take 40 mg by mouth daily. 11/29/21   Unk Pinto, MD  tamsulosin (FLOMAX) 0.4 MG CAPS capsule Take 0.4 mg by mouth at bedtime.    [provider]  tizanidine (ZANAFLEX) 2 MG capsule Take 1 capsule (2 mg total) by mouth 3 (three) times daily as needed for muscle spasms. Patient not taking: Reported on 11/25/2022 09/06/22   Frederik Pear, MD    Physical Exam: Vitals:   12/24/22 2030 12/24/22 2045 12/24/22 2100 12/24/22 2130  BP: (!) 207/97 (!) 197/103 (!) 166/74 (!) 166/68  Pulse: 68 62 75 71  Resp: (!) 30 (!) 21 (!) 27 (!) 27  Temp:      TempSrc:      SpO2: 99% 99% 100% 100%    Constitutional: NAD, calm, comfortable Vitals:   12/24/22 2030 12/24/22 2045 12/24/22 2100 12/24/22 2130  BP: (!) 207/97 (!) 197/103 (!) 166/74 (!) 166/68  Pulse: 68 62 75 71  Resp: (!) 30 (!) 21 (!) 27 (!) 27  Temp:      TempSrc:      SpO2: 99% 99% 100% 100%   Eyes: PERRL,  lids and conjunctivae normal ENMT: Mucous membranes are moist. Posterior pharynx clear of any exudate or lesions.Normal dentition.  Neck: normal, supple, no masses, no thyromegaly Respiratory: clear to auscultation bilaterally, no wheezing, no crackles. Normal respiratory effort. No accessory muscle use.  Cardiovascular: Regular rate and rhythm, no murmurs / rubs / gallops. No extremity edema. 2+ pedal pulses. No carotid bruits.  Abdomen: + RLQtenderness, no masses palpated. No hepatosplenomegaly. Bowel sounds positive.  Musculoskeletal: no clubbing / cyanosis. No joint deformity upper and lower extremities. Good ROM, no contractures. Normal muscle tone.  Skin: no rashes, lesions, ulcers. No induration Neurologic: CN 2-12 grossly intact. Sensation intact, l. Strength 5/5 in all 4.  Psychiatric: Normal judgment and insight. Alert and oriented x 2. Normal mood.    Labs on Admission: I have personally reviewed following labs and imaging studies  CBC: Recent Labs  Lab 12/24/22 1445  WBC 9.0  NEUTROABS 6.7  HGB 14.3  HCT 45.8  MCV 80.2  PLT 637   Basic Metabolic Panel: Recent Labs  Lab 12/24/22 1445 12/24/22 1507  NA 138  --   K 2.9*  --   CL 103  --   CO2 25  --   GLUCOSE 126*  --   BUN 10  --   CREATININE 1.56*  --   CALCIUM 10.1  --   MG  --  2.3   GFR: CrCl cannot be calculated (Unknown ideal weight.). Liver Function Tests: Recent Labs  Lab 12/24/22 1445  AST 54*  ALT 34  ALKPHOS 70  BILITOT 1.0  PROT 6.6  ALBUMIN 3.2*   No results for input(s): "LIPASE", "AMYLASE" in the last 168 hours. No results for input(s): "AMMONIA" in the last 168 hours. Coagulation Profile: No results for input(s): "INR", "PROTIME" in the last 168 hours. Cardiac Enzymes: Recent Labs  Lab 12/24/22 1445  CKTOTAL 1,514*   BNP (last 3 results) No results for input(s): "PROBNP" in the last 8760 hours. HbA1C: No results for input(s): "HGBA1C" in the last 72 hours. CBG: No results  for input(s): "GLUCAP" in the last 168 hours. Lipid Profile: No results for input(s): "CHOL", "HDL", "LDLCALC", "TRIG", "CHOLHDL", "LDLDIRECT" in the last 72 hours. Thyroid Function Tests: No results for input(s): "TSH", "T4TOTAL", "FREET4", "T3FREE", "THYROIDAB" in the last 72 hours. Anemia Panel: No results for input(s): "VITAMINB12", "FOLATE", "FERRITIN", "TIBC", "IRON", "RETICCTPCT" in the last 72 hours. Urine analysis:    Component Value Date/Time   COLORURINE YELLOW 12/24/2022 1538   APPEARANCEUR HAZY (A) 12/24/2022 1538   LABSPEC 1.011 12/24/2022 1538   PHURINE 6.0 12/24/2022 1538   GLUCOSEU NEGATIVE 12/24/2022 1538   HGBUR LARGE (A) 12/24/2022 1538   BILIRUBINUR NEGATIVE 12/24/2022 1538   KETONESUR NEGATIVE 12/24/2022 1538   PROTEINUR 100 (A) 12/24/2022 1538   NITRITE NEGATIVE 12/24/2022 1538   LEUKOCYTESUR NEGATIVE 12/24/2022 1538    Radiological Exams on Admission: CT Abdomen Pelvis W Contrast  Result Date: 12/24/2022 CLINICAL DATA:  Abdominal trauma EXAM: CT ABDOMEN AND PELVIS WITH CONTRAST TECHNIQUE: Multidetector CT imaging of the abdomen and pelvis was performed using the standard protocol following bolus administration of intravenous contrast. RADIATION DOSE REDUCTION: This exam was performed according to the departmental dose-optimization program which includes automated exposure control, adjustment of the mA and/or kV according to patient size and/or use of iterative reconstruction technique. CONTRAST:  30m OMNIPAQUE IOHEXOL 350 MG/ML SOLN COMPARISON:  None Available. FINDINGS: Lower chest: No acute abnormality. Dependent bibasilar scarring and or atelectasis. Coronary artery calcifications and or stents. Hepatobiliary: No solid liver abnormality is seen. Gallstones. No gallbladder wall thickening, or biliary dilatation. Pancreas: Unremarkable. No pancreatic ductal dilatation or surrounding inflammatory changes. Spleen: Normal in size without significant abnormality.  Adrenals/Urinary Tract: Adrenal glands are unremarkable. Simple, benign right renal cortical cysts, for which no further follow-up or characterization is required. Kidneys are otherwise normal, without renal calculi, solid lesion,  or hydronephrosis. Bladder is unremarkable. Stomach/Bowel: Stomach is within normal limits. Wall thickening and fat stranding about the appendix, which is fluid-filled and measures up to 0.9 cm in caliber (series 6, image 67). Vascular/Lymphatic: Aortic atherosclerosis. No enlarged abdominal or pelvic lymph nodes. Reproductive: No mass.  Prostate brachytherapy. Other: No abdominal wall hernia or abnormality. No ascites. Musculoskeletal: No acute or significant osseous findings. IMPRESSION: 1. No CT evidence of acute traumatic injury to the abdomen or pelvis. 2. Wall thickening and fat stranding about the appendix, which is fluid-filled and measures up to 0.9 cm in caliber. Findings are consistent with acute appendicitis. No evidence of complicating abscess or perforation. 3. Cholelithiasis. 4. Prostate brachytherapy. 5. Coronary artery disease. Aortic Atherosclerosis (ICD10-I70.0). Electronically Signed   By: Delanna Ahmadi M.D.   On: 12/24/2022 18:13   CT Cervical Spine Wo Contrast  Result Date: 12/24/2022 CLINICAL DATA:  Neck trauma EXAM: CT CERVICAL SPINE WITHOUT CONTRAST TECHNIQUE: Multidetector CT imaging of the cervical spine was performed without intravenous contrast. Multiplanar CT image reconstructions were also generated. RADIATION DOSE REDUCTION: This exam was performed according to the departmental dose-optimization program which includes automated exposure control, adjustment of the mA and/or kV according to patient size and/or use of iterative reconstruction technique. COMPARISON:  None Available. FINDINGS: Alignment: Straightening of the normal cervical lordosis. Skull base and vertebrae: No acute fracture. No primary bone lesion or focal pathologic process. Soft tissues  and spinal canal: No prevertebral fluid or swelling. No visible canal hematoma. Disc levels: Moderate multilevel disc space height loss and ankylosis throughout. Posterior bridging osteophytosis, most notably at C3 through C5 (series 6, image 30). Upper chest: Negative. Other: None. IMPRESSION: 1. No fracture or static subluxation of the cervical spine. 2. Moderate multilevel disc space height loss and ankylosis throughout. 3. 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 MRI may be used to assess for central cervical canal pathology if desired. Electronically Signed   By: Delanna Ahmadi M.D.   On: 12/24/2022 18:09   CT Head Wo Contrast  Result Date: 12/24/2022 CLINICAL DATA:  Trauma EXAM: CT HEAD WITHOUT CONTRAST TECHNIQUE: Contiguous axial images were obtained from the base of the skull through the vertex without intravenous contrast. RADIATION DOSE REDUCTION: This exam was performed according to the departmental dose-optimization program which includes automated exposure control, adjustment of the mA and/or kV according to patient size and/or use of iterative reconstruction technique. COMPARISON:  CT head 03/25/2021 FINDINGS: Brain: No evidence of acute infarction, hemorrhage, hydrocephalus, extra-axial collection or mass lesion/mass effect. Again seen is mild diffuse atrophy and mild periventricular white matter hypodensity, likely chronic small vessel ischemic change. Vascular: Atherosclerotic calcifications are present within the cavernous internal carotid arteries. Skull: Normal. Negative for fracture or focal lesion. Sinuses/Orbits: No acute finding. Other: None. IMPRESSION: 1. No acute intracranial abnormality. 2. Mild diffuse atrophy and mild chronic small vessel ischemic change. Electronically Signed   By: Ronney Asters M.D.   On: 12/24/2022 18:04   DG Pelvis 1-2 Views  Result Date: 12/24/2022 CLINICAL DATA:  Pelvic pain after fall. EXAM: PELVIS - 1-2 VIEW  COMPARISON:  September 24, 2004. FINDINGS: There is no evidence of pelvic fracture or diastasis. No pelvic bone lesions are seen. Status post prostatic brachytherapy seed placement. IMPRESSION: Negative. Electronically Signed   By: Marijo Conception M.D.   On: 12/24/2022 15:27   DG Chest 2 View  Result Date: 12/24/2022 CLINICAL DATA:  Fall EXAM: CHEST - 2 VIEW COMPARISON:  Chest x-ray 08/03/2022 FINDINGS: Cardiomediastinal silhouette appears stable. Is no focal lung infiltrate, pleural effusion or pneumothorax. No acute fractures are seen. IMPRESSION: No active cardiopulmonary disease. Electronically Signed   By: Ronney Asters M.D.   On: 12/24/2022 15:27    EKG: Independently reviewed.   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   Hypokalemia -s/p repletion in ED -repeat labs    Adenocarcinoma of prostate  -s/p radiation seeds -no active issues    CAD  -s/p remote stent -resume home regimen  -no current cardiac complaints   Essential hypertension uncontrolled  -resume home regimen  -treat pain  - prn    DMII diet controlled -iss /fs  -check A1c     PTSD/anxiety /depression -resume home regimen   DVT prophylaxis: scd Code Status: full/ as discussed per patient wishes in event of cardiac arrest  Family Communication: .none at bedside Disposition Plan: patient  expected to be admitted greater than 2 midnights  Consults called: Surgery  Admission status: progressive   Clance Boll MD [facility]   If 7PM-7AM, please contact night-coverage [email or web address] Password [facility]  12/24/2022, 9:49 PM

## 2022-12-24 NOTE — ED Notes (Addendum)
ED TO INPATIENT HANDOFF REPORT  ED Nurse Name and Phone #: Mosie Lukes RN #[phone]    S Name/Age/Gender [name] Colon 82 y.o. male Room/Bed: 009C/009C  Code Status   Code Status: Prior  Home/SNF/Other Home Patient oriented to: self, place, and time Is this baseline? Yes   Triage Complete: Triage complete  Chief Complaint Acute appendicitis [K35.80]  Triage Note No notes on file   Allergies No Known Allergies  Level of Care/Admitting Diagnosis ED Disposition     ED Disposition  Admit   Condition  --   [facility]: [facility] [[id number]]  Level of Care: Progressive [102]  Admit to Progressive based on following criteria: GI, ENDOCRINE disease patients with GI bleeding, acute liver failure or pancreatitis, stable with diabetic ketoacidosis or thyrotoxicosis (hypothyroid) state.  Admit to Progressive based on following criteria: MULTISYSTEM THREATS such as stable sepsis, metabolic/electrolyte imbalance with or without encephalopathy that is responding to early treatment.  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   Arthritis    Axillary abscess    left   CAD (coronary artery disease)    Diverticulitis, colon    History of colonic polyps    HTN (hypertension)    Hyperglycemia    diet controlled   Hyperlipidemia    Pre-diabetes    Radiation proctitis    with chronic bleeding   Past  Surgical History:  Procedure Laterality Date   COLONOSCOPY     COLONOSCOPY W/ POLYPECTOMY     CORONARY ANGIOPLASTY WITH STENT PLACEMENT  04/2008   IR RADIOLOGIST EVAL & MGMT  07/22/2021   IR RADIOLOGIST EVAL & MGMT  10/20/2021   MASS EXCISION Left 10/06/2017   Procedure: EXCISION LEFT AXILLA CYST;  Surgeon: Erroll Luna, MD;  Location: [facility];  Service: General;  Laterality: Left;   TOTAL KNEE ARTHROPLASTY Right 09/06/2022   Procedure: RIGHT TOTAL KNEE ARTHROPLASTY;  Surgeon: Frederik Pear, MD;  Location: [facility];  Service: Orthopedics;  Laterality: Right;     A IV Location/Drains/Wounds Patient Lines/Drains/Airways Status     Active Line/Drains/Airways     Name Placement date Placement time Site Days   Peripheral IV 12/24/22 20 G Anterior;Distal;Right;Upper Arm 12/24/22  1452  Arm  less than 1   Incision (Closed) 10/06/17 Axilla Left 10/06/17  1018  -- 1905   Incision (Closed) 09/06/22 Knee Right 09/06/22  1227  -- 109            Intake/Output Last 24 hours  Intake/Output Summary (Last 24 hours) at 12/24/2022 2248 Last data filed at 12/24/2022 2043 Gross per 24 hour  Intake 700 ml  Output --  Net 700 ml    Labs/Imaging Results for orders placed or performed during the hospital encounter of 12/24/22 (from the past 48 hour(s))  Comprehensive metabolic panel     Status: Abnormal   Collection Time: 12/24/22  2:45 PM  Result Value Ref Range  Sodium 138 135 - 145 mmol/L   Potassium 2.9 (L) 3.5 - 5.1 mmol/L   Chloride 103 98 - 111 mmol/L   CO2 25 22 - 32 mmol/L   Glucose, Bld 126 (H) 70 - 99 mg/dL    Comment: Glucose reference range applies only to samples taken after fasting for at least 8 hours.   BUN 10 8 - 23 mg/dL   Creatinine, Ser 1.56 (H) 0.61 - 1.24 mg/dL   Calcium 10.1 8.9 - 10.3 mg/dL   Total Protein 6.6 6.5 - 8.1 g/dL   Albumin 3.2 (L) 3.5 - 5.0 g/dL   AST 54 (H) 15 - 41 U/L   ALT 34 0 - 44 U/L   Alkaline Phosphatase 70 38 - 126 U/L    Total Bilirubin 1.0 0.3 - 1.2 mg/dL   GFR, Estimated 44 (L) >60 mL/min    Comment: (NOTE) Calculated using the CKD-EPI Creatinine Equation (2021)    Anion gap 10 5 - 15    Comment: Performed at [facility] [address], [place], [place] [address]  CBC with Differential     Status: Abnormal   Collection Time: 12/24/22  2:45 PM  Result Value Ref Range   WBC 9.0 4.0 - 10.5 K/uL   RBC 5.71 4.22 - 5.81 MIL/uL   Hemoglobin 14.3 13.0 - 17.0 g/dL   HCT 45.8 39.0 - 52.0 %   MCV 80.2 80.0 - 100.0 fL   MCH 25.0 (L) 26.0 - 34.0 pg   MCHC 31.2 30.0 - 36.0 g/dL   RDW 15.7 (H) 11.5 - 15.5 %   Platelets 160 150 - 400 K/uL   nRBC 0.0 0.0 - 0.2 %   Neutrophils Relative % 75 %   Neutro Abs 6.7 1.7 - 7.7 K/uL   Lymphocytes Relative 13 %   Lymphs Abs 1.2 0.7 - 4.0 K/uL   Monocytes Relative 11 %   Monocytes Absolute 1.0 0.1 - 1.0 K/uL   Eosinophils Relative 1 %   Eosinophils Absolute 0.1 0.0 - 0.5 K/uL   Basophils Relative 0 %   Basophils Absolute 0.0 0.0 - 0.1 K/uL   Immature Granulocytes 0 %   Abs Immature Granulocytes 0.04 0.00 - 0.07 K/uL    Comment: Performed at [facility] [address], [place], [place] [address]  CK     Status: Abnormal   Collection Time: 12/24/22  2:45 PM  Result Value Ref Range   Total CK 1,514 (H) 49 - 397 U/L    Comment: Performed at [facility], [place] [address], [place], [place] [address]  Resp panel by RT-PCR (RSV, Flu A&B, Covid) Urine, Clean Catch     Status: None   Collection Time: 12/24/22  2:46 PM   Specimen: Urine, Clean Catch; Nasal Swab  Result Value Ref Range   SARS Coronavirus 2 by RT PCR NEGATIVE NEGATIVE   Influenza A by PCR NEGATIVE NEGATIVE   Influenza B by PCR NEGATIVE NEGATIVE    Comment: (NOTE) The Xpert Xpress SARS-CoV-2/FLU/RSV plus assay is intended as an aid in the diagnosis of influenza from Nasopharyngeal swab specimens and should not be used as a sole basis for treatment. Nasal washings and aspirates are  unacceptable for Xpert Xpress SARS-CoV-2/FLU/RSV testing.  Fact Sheet for Patients: EntrepreneurPulse.com.au  Fact Sheet for Healthcare Providers: IncredibleEmployment.be  This test is not yet approved or cleared by the [place] FDA and has been authorized for detection and/or diagnosis of SARS-CoV-2 by FDA  under an Emergency Use Authorization (EUA). This EUA will remain in effect (meaning this test can be used) for the duration of the COVID-19 declaration under Section 564(b)(1) of the Act, 21 U.S.C. section 360bbb-3(b)(1), unless the authorization is terminated or revoked.     Resp Syncytial Virus by PCR NEGATIVE NEGATIVE    Comment: (NOTE) Fact Sheet for Patients: EntrepreneurPulse.com.au  Fact Sheet for Healthcare Providers: IncredibleEmployment.be  This test is not yet approved or cleared by the [place] FDA and has been authorized for detection and/or diagnosis of SARS-CoV-2 by FDA under an Emergency Use Authorization (EUA). This EUA will remain in effect (meaning this test can be used) for the duration of the COVID-19 declaration under Section 564(b)(1) of the Act, 21 U.S.C. section 360bbb-3(b)(1), unless the authorization is terminated or revoked.  Performed at [facility], [place] [address], [place], [place] [address]   Magnesium     Status: None   Collection Time: 12/24/22  3:07 PM  Result Value Ref Range   Magnesium 2.3 1.7 - 2.4 mg/dL    Comment: Performed at [facility], [place] [address], [place], [place] [address]  Urinalysis, Routine w reflex microscopic -Urine, Clean Catch     Status: Abnormal   Collection Time: 12/24/22  3:38 PM  Result Value Ref Range   Color, Urine YELLOW YELLOW   APPearance HAZY (A) CLEAR   Specific Gravity, Urine 1.011 1.005 - 1.030   pH 6.0 5.0 - 8.0   Glucose, UA NEGATIVE NEGATIVE mg/dL   Hgb urine dipstick LARGE (A) NEGATIVE   Bilirubin  Urine NEGATIVE NEGATIVE   Ketones, ur NEGATIVE NEGATIVE mg/dL   Protein, ur 100 (A) NEGATIVE mg/dL   Nitrite NEGATIVE NEGATIVE   Leukocytes,Ua NEGATIVE NEGATIVE   RBC / HPF 0-5 0 - 5 RBC/hpf   WBC, UA 6-10 0 - 5 WBC/hpf   Bacteria, UA RARE (A) NONE SEEN   Squamous Epithelial / HPF 0-5 0 - 5 /HPF   Mucus PRESENT    Hyaline Casts, UA PRESENT     Comment: Performed at [facility], [address] [place], [place] [address]   CT Abdomen Pelvis W Contrast  Result Date: 12/24/2022 CLINICAL DATA:  Abdominal trauma EXAM: CT ABDOMEN AND PELVIS WITH CONTRAST TECHNIQUE: Multidetector CT imaging of the abdomen and pelvis was performed using the standard protocol following bolus administration of intravenous contrast. RADIATION DOSE REDUCTION: This exam was performed according to the departmental dose-optimization program which includes automated exposure control, adjustment of the mA and/or kV according to patient size and/or use of iterative reconstruction technique. CONTRAST:  52m OMNIPAQUE IOHEXOL 350 MG/ML SOLN COMPARISON:  None Available. FINDINGS: Lower chest: No acute abnormality. Dependent bibasilar scarring and or atelectasis. Coronary artery calcifications and or stents. Hepatobiliary: No solid liver abnormality is seen. Gallstones. No gallbladder wall thickening, or biliary dilatation. Pancreas: Unremarkable. No pancreatic ductal dilatation or surrounding inflammatory changes. Spleen: Normal in size without significant abnormality. Adrenals/Urinary Tract: Adrenal glands are unremarkable. Simple, benign right renal cortical cysts, for which no further follow-up or characterization is required. Kidneys are otherwise normal, without renal calculi, solid lesion, or hydronephrosis. Bladder is unremarkable. Stomach/Bowel: Stomach is within normal limits. Wall thickening and fat stranding about the appendix, which is fluid-filled and measures up to 0.9 cm in caliber (series 6, image 67).  Vascular/Lymphatic: Aortic atherosclerosis. No enlarged abdominal or pelvic lymph nodes. Reproductive: No mass.  Prostate brachytherapy. Other: No abdominal wall hernia or abnormality. No ascites. Musculoskeletal:  No acute or significant osseous findings. IMPRESSION: 1. No CT evidence of acute traumatic injury to the abdomen or pelvis. 2. Wall thickening and fat stranding about the appendix, which is fluid-filled and measures up to 0.9 cm in caliber. Findings are consistent with acute appendicitis. No evidence of complicating abscess or perforation. 3. Cholelithiasis. 4. Prostate brachytherapy. 5. Coronary artery disease. Aortic Atherosclerosis (ICD10-I70.0). Electronically Signed   By: Delanna Ahmadi M.D.   On: 12/24/2022 18:13   CT Cervical Spine Wo Contrast  Result Date: 12/24/2022 CLINICAL DATA:  Neck trauma EXAM: CT CERVICAL SPINE WITHOUT CONTRAST TECHNIQUE: Multidetector CT imaging of the cervical spine was performed without intravenous contrast. Multiplanar CT image reconstructions were also generated. RADIATION DOSE REDUCTION: This exam was performed according to the departmental dose-optimization program which includes automated exposure control, adjustment of the mA and/or kV according to patient size and/or use of iterative reconstruction technique. COMPARISON:  None Available. FINDINGS: Alignment: Straightening of the normal cervical lordosis. Skull base and vertebrae: No acute fracture. No primary bone lesion or focal pathologic process. Soft tissues and spinal canal: No prevertebral fluid or swelling. No visible canal hematoma. Disc levels: Moderate multilevel disc space height loss and ankylosis throughout. Posterior bridging osteophytosis, most notably at C3 through C5 (series 6, image 30). Upper chest: Negative. Other: None. IMPRESSION: 1. No fracture or static subluxation of the cervical spine. 2. Moderate multilevel disc space height loss and ankylosis throughout. 3. 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 MRI may be used to assess for central cervical canal pathology if desired. Electronically Signed   By: Delanna Ahmadi M.D.   On: 12/24/2022 18:09   CT Head Wo Contrast  Result Date: 12/24/2022 CLINICAL DATA:  Trauma EXAM: CT HEAD WITHOUT CONTRAST TECHNIQUE: Contiguous axial images were obtained from the base of the skull through the vertex without intravenous contrast. RADIATION DOSE REDUCTION: This exam was performed according to the departmental dose-optimization program which includes automated exposure control, adjustment of the mA and/or kV according to patient size and/or use of iterative reconstruction technique. COMPARISON:  CT head 03/25/2021 FINDINGS: Brain: No evidence of acute infarction, hemorrhage, hydrocephalus, extra-axial collection or mass lesion/mass effect. Again seen is mild diffuse atrophy and mild periventricular white matter hypodensity, likely chronic small vessel ischemic change. Vascular: Atherosclerotic calcifications are present within the cavernous internal carotid arteries. Skull: Normal. Negative for fracture or focal lesion. Sinuses/Orbits: No acute finding. Other: None. IMPRESSION: 1. No acute intracranial abnormality. 2. Mild diffuse atrophy and mild chronic small vessel ischemic change. Electronically Signed   By: Ronney Asters M.D.   On: 12/24/2022 18:04   DG Pelvis 1-2 Views  Result Date: 12/24/2022 CLINICAL DATA:  Pelvic pain after fall. EXAM: PELVIS - 1-2 VIEW COMPARISON:  September 24, 2004. FINDINGS: There is no evidence of pelvic fracture or diastasis. No pelvic bone lesions are seen. Status post prostatic brachytherapy seed placement. IMPRESSION: Negative. Electronically Signed   By: Marijo Conception M.D.   On: 12/24/2022 15:27   DG Chest 2 View  Result Date: 12/24/2022 CLINICAL DATA:  Fall EXAM: CHEST - 2 VIEW COMPARISON:  Chest x-ray 08/03/2022 FINDINGS: Cardiomediastinal silhouette appears stable.  Is no focal lung infiltrate, pleural effusion or pneumothorax. No acute fractures are seen. IMPRESSION: No active cardiopulmonary disease. Electronically Signed   By: Ronney Asters M.D.   On: 12/24/2022 15:27    Pending Labs Unresulted Labs (From admission, onward)     Start  Ordered   12/25/22 0526  CK  Once,   R        12/24/22 2226            Vitals/Pain Today's Vitals   12/24/22 2030 12/24/22 2045 12/24/22 2100 12/24/22 2130  BP: (!) 207/97 (!) 197/103 (!) 166/74 (!) 166/68  Pulse: 68 62 75 71  Resp: (!) 30 (!) 21 (!) 27 (!) 27  Temp:      TempSrc:      SpO2: 99% 99% 100% 100%  PainSc:        Isolation Precautions No active isolations  Medications Medications  losartan (COZAAR) tablet 100 mg (100 mg Oral Given 12/24/22 1933)  cefTRIAXone (ROCEPHIN) 2 g in sodium chloride 0.9 % 100 mL IVPB (has no administration in time range)    And  metroNIDAZOLE (FLAGYL) IVPB 500 mg (has no administration in time range)  0.9 %  sodium chloride infusion (has no administration in time range)  sodium chloride 0.9 % bolus 500 mL (0 mLs Intravenous Stopped 12/24/22 1734)  potassium chloride SA (KLOR-CON M) CR tablet 40 mEq (40 mEq Oral Given 12/24/22 1628)  sodium chloride 0.9 % bolus 500 mL (0 mLs Intravenous Stopped 12/24/22 1938)  iohexol (OMNIPAQUE) 350 MG/ML injection 75 mL (75 mLs Intravenous Contrast Given 12/24/22 1749)  cefTRIAXone (ROCEPHIN) 2 g in sodium chloride 0.9 % 100 mL IVPB (0 g Intravenous Stopped 12/24/22 2042)    And  metroNIDAZOLE (FLAGYL) IVPB 500 mg (0 mg Intravenous Stopped 12/24/22 2043)  hydrALAZINE (APRESOLINE) injection 10 mg (10 mg Intravenous Given 12/24/22 2046)    Mobility walks with device     Focused Assessments Cardiac Assessment Handoff:    Lab Results  Component Value Date   CKTOTAL 1,514 (H) 12/24/2022   CKMB 3.1 05/03/2008   TROPONINI 0.01        NO INDICATION OF MYOCARDIAL INJURY. 05/03/2008   No results found for: "DDIMER" Does the Patient  currently have chest pain? No    R Recommendations: See Admitting Provider Note  Report given to:   Additional Notes: Patient has been resting, was confused about time of day but reoriented easily. Family information is correct on the patients chart. Daughter prefers for her to be called 1st. Patient has purewick. If there is anything else please give me a call. Thank you

## 2022-12-25 ENCOUNTER — Inpatient Hospital Stay (HOSPITAL_COMMUNITY): Payer: Medicare Other | Admitting: Anesthesiology

## 2022-12-25 ENCOUNTER — Encounter (HOSPITAL_COMMUNITY)
Admission: EM | Disposition: A | Discharge status: Home / Self Care | Payer: Self-pay | Source: Home / Self Care | Attending: Family Medicine

## 2022-12-25 ENCOUNTER — Encounter (HOSPITAL_COMMUNITY): Payer: Self-pay | Admitting: Internal Medicine

## 2022-12-25 ENCOUNTER — Other Ambulatory Visit: Payer: Self-pay

## 2022-12-25 DIAGNOSIS — K3533 Acute appendicitis with perforation and localized peritonitis, with abscess: Secondary | ICD-10-CM

## 2022-12-25 DIAGNOSIS — Z87891 Personal history of nicotine dependence: Secondary | ICD-10-CM | POA: Diagnosis not present

## 2022-12-25 DIAGNOSIS — K353 Acute appendicitis with localized peritonitis, without perforation or gangrene: Secondary | ICD-10-CM

## 2022-12-25 DIAGNOSIS — I10 Essential (primary) hypertension: Secondary | ICD-10-CM

## 2022-12-25 DIAGNOSIS — I2510 Atherosclerotic heart disease of native coronary artery without angina pectoris: Secondary | ICD-10-CM

## 2022-12-25 HISTORY — PX: LAPAROSCOPIC APPENDECTOMY: SHX408

## 2022-12-25 LAB — COMPREHENSIVE METABOLIC PANEL
ALT: 40 U/L (ref 0–44)
AST: 56 U/L — ABNORMAL HIGH (ref 15–41)
Albumin: 3.2 g/dL — ABNORMAL LOW (ref 3.5–5.0)
Alkaline Phosphatase: 65 U/L (ref 38–126)
Anion gap: 8 (ref 5–15)
BUN: 8 mg/dL (ref 8–23)
CO2: 26 mmol/L (ref 22–32)
Calcium: 10.1 mg/dL (ref 8.9–10.3)
Chloride: 107 mmol/L (ref 98–111)
Creatinine, Ser: 1.37 mg/dL — ABNORMAL HIGH (ref 0.61–1.24)
GFR, Estimated: 52 mL/min — ABNORMAL LOW (ref >60)
Glucose, Bld: 126 mg/dL — ABNORMAL HIGH (ref 70–99)
Potassium: 3.2 mmol/L — ABNORMAL LOW (ref 3.5–5.1)
Sodium: 141 mmol/L (ref 135–145)
Total Bilirubin: 0.9 mg/dL (ref 0.3–1.2)
Total Protein: 6.6 g/dL (ref 6.5–8.1)

## 2022-12-25 LAB — CBC
HCT: 45 % (ref 39.0–52.0)
Hemoglobin: 14.3 g/dL (ref 13.0–17.0)
MCH: 25 pg — ABNORMAL LOW (ref 26.0–34.0)
MCHC: 31.8 g/dL (ref 30.0–36.0)
MCV: 78.8 fL — ABNORMAL LOW (ref 80.0–100.0)
Platelets: 193 10*3/uL (ref 150–400)
RBC: 5.71 MIL/uL (ref 4.22–5.81)
RDW: 15.8 % — ABNORMAL HIGH (ref 11.5–15.5)
WBC: 8.8 10*3/uL (ref 4.0–10.5)
nRBC: 0 % (ref 0.0–0.2)

## 2022-12-25 LAB — GLUCOSE, CAPILLARY
Glucose-Capillary: 105 mg/dL — ABNORMAL HIGH (ref 70–99)
Glucose-Capillary: 133 mg/dL — ABNORMAL HIGH (ref 70–99)
Glucose-Capillary: 156 mg/dL — ABNORMAL HIGH (ref 70–99)

## 2022-12-25 LAB — HEMOGLOBIN A1C
Hgb A1c MFr Bld: 5.8 % — ABNORMAL HIGH (ref 4.8–5.6)
Mean Plasma Glucose: 119.76 mg/dL

## 2022-12-25 LAB — CK: Total CK: 1418 U/L — ABNORMAL HIGH (ref 49–397)

## 2022-12-25 SURGERY — APPENDECTOMY, LAPAROSCOPIC
Anesthesia: General

## 2022-12-25 MED ORDER — 0.9 % SODIUM CHLORIDE (POUR BTL) OPTIME
TOPICAL | Status: DC | PRN
  Administered 2022-12-25: 1000 mL

## 2022-12-25 MED ORDER — HYDRALAZINE HCL 20 MG/ML IJ SOLN
10.0000 mg | Freq: Four times a day (QID) | INTRAMUSCULAR | Status: DC | PRN
  Administered 2022-12-25: 10 mg via INTRAVENOUS

## 2022-12-25 MED ORDER — LACTATED RINGERS IV SOLN: INTRAVENOUS | Status: DC

## 2022-12-25 MED ORDER — CHLORHEXIDINE GLUCONATE 0.12 % MT SOLN
OROMUCOSAL | Status: AC
  Filled 2022-12-25: qty 15

## 2022-12-25 MED ORDER — PHENYLEPHRINE HCL-NACL 20-0.9 MG/250ML-% IV SOLN
INTRAVENOUS | Status: DC | PRN
  Administered 2022-12-25: 20 ug/min via INTRAVENOUS

## 2022-12-25 MED ORDER — PROPOFOL 10 MG/ML IV BOLUS
INTRAVENOUS | Status: AC
  Filled 2022-12-25: qty 20

## 2022-12-25 MED ORDER — ROCURONIUM BROMIDE 10 MG/ML (PF) SYRINGE
PREFILLED_SYRINGE | INTRAVENOUS | Status: DC | PRN
  Administered 2022-12-25: 30 mg via INTRAVENOUS

## 2022-12-25 MED ORDER — POTASSIUM CHLORIDE 10 MEQ/100ML IV SOLN
10.0000 meq | INTRAVENOUS | Status: AC
  Filled 2022-12-25: qty 100

## 2022-12-25 MED ORDER — ONDANSETRON HCL 4 MG/2ML IJ SOLN
INTRAMUSCULAR | Status: DC | PRN
  Administered 2022-12-25: 4 mg via INTRAVENOUS

## 2022-12-25 MED ORDER — BUPIVACAINE HCL (PF) 0.5 % IJ SOLN
INTRAMUSCULAR | Status: AC
  Filled 2022-12-25: qty 30

## 2022-12-25 MED ORDER — DEXAMETHASONE SODIUM PHOSPHATE 10 MG/ML IJ SOLN
INTRAMUSCULAR | Status: DC | PRN
  Administered 2022-12-25: 10 mg via INTRAVENOUS

## 2022-12-25 MED ORDER — FENTANYL CITRATE (PF) 250 MCG/5ML IJ SOLN
INTRAMUSCULAR | Status: DC | PRN
  Administered 2022-12-25 (×3): 50 ug via INTRAVENOUS

## 2022-12-25 MED ORDER — FENTANYL CITRATE (PF) 250 MCG/5ML IJ SOLN
INTRAMUSCULAR | Status: AC
  Filled 2022-12-25: qty 5

## 2022-12-25 MED ORDER — PROPOFOL 10 MG/ML IV BOLUS
INTRAVENOUS | Status: DC | PRN
  Administered 2022-12-25: 100 mg via INTRAVENOUS

## 2022-12-25 MED ORDER — INSULIN ASPART 100 UNIT/ML IJ SOLN: 0.0000 [IU] | INTRAMUSCULAR | Status: DC | PRN

## 2022-12-25 MED ORDER — LIDOCAINE 2% (20 MG/ML) 5 ML SYRINGE
INTRAMUSCULAR | Status: DC | PRN
  Administered 2022-12-25: 80 mg via INTRAVENOUS

## 2022-12-25 MED ORDER — ORAL CARE MOUTH RINSE: 15.0000 mL | Freq: Once | OROMUCOSAL | Status: AC

## 2022-12-25 MED ORDER — INSULIN ASPART 100 UNIT/ML IJ SOLN
0.0000 [IU] | INTRAMUSCULAR | Status: DC
  Administered 2022-12-25 – 2022-12-26 (×2): 2 [IU] via SUBCUTANEOUS
  Administered 2022-12-26 – 2022-12-27 (×3): 1 [IU] via SUBCUTANEOUS
  Administered 2022-12-27: 3 [IU] via SUBCUTANEOUS

## 2022-12-25 MED ORDER — BUPIVACAINE HCL (PF) 0.25 % IJ SOLN
INTRAMUSCULAR | Status: AC
  Filled 2022-12-25: qty 30

## 2022-12-25 MED ORDER — HYDRALAZINE HCL 20 MG/ML IJ SOLN
10.0000 mg | Freq: Four times a day (QID) | INTRAMUSCULAR | Status: DC | PRN
  Administered 2022-12-25 – 2022-12-28 (×7): 10 mg via INTRAVENOUS
  Filled 2022-12-25 (×7): qty 1

## 2022-12-25 MED ORDER — PHENYLEPHRINE 80 MCG/ML (10ML) SYRINGE FOR IV PUSH (FOR BLOOD PRESSURE SUPPORT)
PREFILLED_SYRINGE | INTRAVENOUS | Status: DC | PRN
  Administered 2022-12-25: 80 ug via INTRAVENOUS

## 2022-12-25 MED ORDER — SUGAMMADEX SODIUM 200 MG/2ML IV SOLN
INTRAVENOUS | Status: DC | PRN
  Administered 2022-12-25: 180 mg via INTRAVENOUS

## 2022-12-25 MED ORDER — CHLORHEXIDINE GLUCONATE 0.12 % MT SOLN
15.0000 mL | Freq: Once | OROMUCOSAL | Status: AC
  Administered 2022-12-25: 15 mL via OROMUCOSAL

## 2022-12-25 MED ORDER — SUCCINYLCHOLINE CHLORIDE 200 MG/10ML IV SOSY
PREFILLED_SYRINGE | INTRAVENOUS | Status: DC | PRN
  Administered 2022-12-25: 80 mg via INTRAVENOUS

## 2022-12-25 MED ORDER — HYDRALAZINE HCL 20 MG/ML IJ SOLN
INTRAMUSCULAR | Status: AC
  Filled 2022-12-25: qty 1

## 2022-12-25 MED ORDER — BUPIVACAINE-EPINEPHRINE 0.25% -1:200000 IJ SOLN
INTRAMUSCULAR | Status: DC | PRN
  Administered 2022-12-25: 14 mL

## 2022-12-25 SURGICAL SUPPLY — 51 items
ADH SKN CLS APL DERMABOND .7 (GAUZE/BANDAGES/DRESSINGS) ×1
APL PRP STRL LF DISP 70% ISPRP (MISCELLANEOUS) ×1
APPLIER CLIP 5 13 M/L LIGAMAX5 (MISCELLANEOUS)
APR CLP MED LRG 5 ANG JAW (MISCELLANEOUS)
BAG COUNTER SPONGE SURGICOUNT (BAG) ×1 IMPLANT
BAG SPNG CNTER NS LX DISP (BAG) ×1
BIOPATCH BLUE 3/4IN DISK W/1.5 (GAUZE/BANDAGES/DRESSINGS) IMPLANT
BLADE CLIPPER SURG (BLADE) IMPLANT
CANISTER SUCT 3000ML PPV (MISCELLANEOUS) ×1 IMPLANT
CHLORAPREP W/TINT 26 (MISCELLANEOUS) ×1 IMPLANT
CLIP APPLIE 5 13 M/L LIGAMAX5 (MISCELLANEOUS) IMPLANT
COVER SURGICAL LIGHT HANDLE (MISCELLANEOUS) ×1 IMPLANT
CUTTER FLEX LINEAR 45M (STAPLE) ×1 IMPLANT
DERMABOND ADVANCED .7 DNX12 (GAUZE/BANDAGES/DRESSINGS) ×1 IMPLANT
DRAIN CHANNEL 19F RND (DRAIN) IMPLANT
DRSG TEGADERM 2-3/8X2-3/4 SM (GAUZE/BANDAGES/DRESSINGS) IMPLANT
ELECT REM PT RETURN 9FT ADLT (ELECTROSURGICAL) ×1
ELECTRODE REM PT RTRN 9FT ADLT (ELECTROSURGICAL) ×1 IMPLANT
EVACUATOR SILICONE 100CC (DRAIN) IMPLANT
GLOVE BIO SURGEON STRL SZ7.5 (GLOVE) ×1 IMPLANT
GLOVE INDICATOR 8.0 STRL GRN (GLOVE) ×1 IMPLANT
GOWN STRL REUS W/ TWL LRG LVL3 (GOWN DISPOSABLE) ×2 IMPLANT
GOWN STRL REUS W/ TWL XL LVL3 (GOWN DISPOSABLE) ×1 IMPLANT
GOWN STRL REUS W/TWL LRG LVL3 (GOWN DISPOSABLE) ×2
GOWN STRL REUS W/TWL XL LVL3 (GOWN DISPOSABLE) ×1
KIT BASIN OR (CUSTOM PROCEDURE TRAY) ×1 IMPLANT
KIT TURNOVER KIT B (KITS) ×1 IMPLANT
NS IRRIG 1000ML POUR BTL (IV SOLUTION) ×1 IMPLANT
PAD ARMBOARD 7.5X6 YLW CONV (MISCELLANEOUS) ×2 IMPLANT
PENCIL SMOKE EVACUATOR (MISCELLANEOUS) ×1 IMPLANT
RELOAD 45 VASCULAR/THIN (ENDOMECHANICALS) IMPLANT
RELOAD STAPLE 45 2.5 WHT GRN (ENDOMECHANICALS) IMPLANT
RELOAD STAPLE 45 3.5 BLU ETS (ENDOMECHANICALS) IMPLANT
RELOAD STAPLE TA45 3.5 REG BLU (ENDOMECHANICALS) ×1 IMPLANT
SCISSORS LAP 5X35 DISP (ENDOMECHANICALS) IMPLANT
SET IRRIG TUBING LAPAROSCOPIC (IRRIGATION / IRRIGATOR) ×1 IMPLANT
SET TUBE SMOKE EVAC HIGH FLOW (TUBING) ×1 IMPLANT
SHEARS HARMONIC ACE PLUS 36CM (ENDOMECHANICALS) ×1 IMPLANT
SPECIMEN JAR SMALL (MISCELLANEOUS) ×1 IMPLANT
SUT ETHILON 2 0 FS 18 (SUTURE) IMPLANT
SUT MNCRL AB 4-0 PS2 18 (SUTURE) ×1 IMPLANT
SYS BAG RETRIEVAL 10MM (BASKET) ×1
SYSTEM BAG RETRIEVAL 10MM (BASKET) ×1 IMPLANT
TOWEL GREEN STERILE (TOWEL DISPOSABLE) ×1 IMPLANT
TOWEL GREEN STERILE FF (TOWEL DISPOSABLE) ×1 IMPLANT
TRAY FOLEY W/BAG SLVR 16FR (SET/KITS/TRAYS/PACK) ×1
TRAY FOLEY W/BAG SLVR 16FR ST (SET/KITS/TRAYS/PACK) ×1 IMPLANT
TRAY LAPAROSCOPIC MC (CUSTOM PROCEDURE TRAY) ×1 IMPLANT
TROCAR ADV FIXATION 5X100MM (TROCAR) ×2 IMPLANT
TROCAR BALLN 12MMX100 BLUNT (TROCAR) ×1 IMPLANT
WATER STERILE IRR 1000ML POUR (IV SOLUTION) ×1 IMPLANT

## 2022-12-25 NOTE — Anesthesia Postprocedure Evaluation (Signed)
Anesthesia Post Note  Patient: [name] Colon  Procedure(s) Performed: APPENDECTOMY LAPAROSCOPIC AND DRAINAGE OF INTRABDOMINAL ABSCESS     Patient location during evaluation: PACU Anesthesia Type: General Level of consciousness: sedated and patient cooperative Pain management: pain level controlled Vital Signs Assessment: post-procedure vital signs reviewed and stable Respiratory status: spontaneous breathing Cardiovascular status: stable Anesthetic complications: no   No notable events documented.  Last Vitals:  Vitals:   12/25/22 1415 12/25/22 1430  BP: (!) 195/79 (!) 165/80  Pulse: (!) 54 (!) 53  Resp: 18 18  Temp:  (!) 36.4 C  SpO2: 97% 97%    Last Pain:  Vitals:   12/25/22 1430  TempSrc: Oral  PainSc: [place]

## 2022-12-25 NOTE — Transfer of Care (Signed)
Immediate Anesthesia Transfer of Care Note  Patient: [name] Colon  Procedure(s) Performed: APPENDECTOMY LAPAROSCOPIC AND DRAINAGE OF INTRABDOMINAL ABSCESS  Patient Location: PACU  Anesthesia Type:General  Level of Consciousness: drowsy and patient cooperative  Airway & Oxygen Therapy: Patient Spontanous Breathing and Patient connected to nasal cannula oxygen  Post-op Assessment: Report given to RN and Post -op Vital signs reviewed and stable  Post vital signs: Reviewed and stable  Last Vitals:  Vitals Value Taken Time  BP 176/72 12/25/22 1310  Temp    Pulse 60 12/25/22 1311  Resp 22 12/25/22 1311  SpO2 100 % 12/25/22 1311  Vitals shown include unvalidated device data.  Last Pain:  Vitals:   12/25/22 1027  TempSrc: Oral  PainSc:          Complications: No notable events documented.

## 2022-12-25 NOTE — Progress Notes (Addendum)
PROGRESS NOTE    [name] Colon  JJK:[id number] DOB: 1940/12/01 DOA: 12/24/2022 PCP: Unk Pinto, MD   Brief Narrative:  HPI: [name] Colon is a 82 y.o. male with medical history significant of  Adenocarcinoma of prostate s/p radiation seeds, CAD s/p stent, essential hypertension, DMII diet controlled,  PTSD/anxiety /depression, who gets care at [facility] presents to ED s/p being found down at home. Per patient unable to give full history of how he fell. However he notes he was too weak get up. He notes for the past 3 days he has been ill with right lower quadrant pain and global weakness unable to get out of bed He also noted low appetite x 1 week but states he was still able to drink and eat. He also note constipation x 1 week as well.  He denies fever/chills/ n/v/dysuria/ sob/ chest pain  or dysuria.     ED Course:  Afeb, BP 176/85, rr16, pul 61  Wbc 9hgb 14.3,  plt 160 ,  NA138, K 2.9, glu 126, cr 1.56 at baseline ( 1.6-1.3)\ CK 1514 Resp panel neg  Cxr: NAD  Pelvis: NAD  EKG: snr /pac WT 500 UA large hgb no rbc CTH NAD Cervical spine . No fracture or static subluxation of the cervical spine. 2. Moderate multilevel disc space height loss and ankylosis throughout. 3. 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 MRI may be used to assess for central cervical canal pathology if desired. Tx potassium 40 meq  nas 500cc  Tx ctx/metroniazole  Assessment & Plan:   Principal Problem:   Acute appendicitis Active Problems:   Essential hypertension   Diet-controlled diabetes mellitus (HCC)   Chronic kidney disease, stage 3a ([place])  Acute appendicitis: Seen by general surgery, they recommended admission under hospital service and antibiotics.  Patient himself wants appendectomy done.  Continue Rocephin and Flagyl.  Will defer to general surgery and continue antibiotics.  Nontraumatic rhabdomyolysis: Continue IV fluids and monitor  CPK.  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  Adenocarcinoma prostate: S/p radiation seeds, no active issues.  Follow-up outpatient.  Essential hypertension: Uncontrolled, blood pressure elevated.  Continue losartan, will start on IV hydralazine as needed.  Type 2 diabetes mellitus: Appears to be diet controlled, recent hemoglobin A1c 6.0.  Repeat hemoglobin A1c and start on SSI.  Hypokalemia: Will replace.  DVT prophylaxis: heparin injection 5,000 Units Start: 12/25/22 0600 SCDs Start: 12/24/22 2316   Code Status: Full Code  Family Communication:  None present at bedside.  Plan of care discussed with patient in length and he/she verbalized understanding and agreed with it.  Status is: Inpatient Remains inpatient appropriate because: Acute appendicitis.   Estimated body mass index is 26.34 kg/m as calculated from the following:   Height as of 11/25/22: '5\' 9"'$  (1.753 m).   Weight as of this encounter: 80.9 kg.    Nutritional Assessment: Body mass index is 26.34 kg/m.Marland Kitchen Seen by dietician.  I agree with the assessment and plan as outlined below: Nutrition Status:        . Skin Assessment: I have examined the patient's skin and I agree with the wound assessment as performed by the wound care RN as outlined below:    Consultants:  General surgery  Procedures:  None  Antimicrobials:  Anti-infectives (From admission, onward)    Start     Dose/Rate Route Frequency Ordered Stop   12/25/22 1900  cefTRIAXone (ROCEPHIN) 2 g in sodium chloride 0.9 % 100  mL IVPB       See Hyperspace for full Linked Orders Report.   2 g 200 mL/hr over 30 Minutes Intravenous Every 24 hours 12/24/22 2224     12/25/22 1000  metroNIDAZOLE (FLAGYL) IVPB 500 mg       See Hyperspace for full Linked Orders Report.   500 mg 100 mL/hr over 60 Minutes Intravenous Every 12 hours 12/24/22 2224     12/24/22 1900  cefTRIAXone (ROCEPHIN) 2 g in sodium chloride 0.9 % 100 mL IVPB       See Hyperspace for full Linked  Orders Report.   2 g 200 mL/hr over 30 Minutes Intravenous  Once 12/24/22 1855 12/24/22 2042   12/24/22 1900  metroNIDAZOLE (FLAGYL) IVPB 500 mg       See Hyperspace for full Linked Orders Report.   500 mg 100 mL/hr over 60 Minutes Intravenous  Once 12/24/22 1855 12/24/22 2043         Subjective: Patient seen and examined.  He states that he still has abdominal pain but feels much better than yesterday.  He wants to have this appendectomy done.  Objective: Vitals:   12/25/22 0040 12/25/22 0055 12/25/22 0404 12/25/22 0800  BP: (!) 184/81 (!) 189/75 (!) 169/78 (!) 185/94  Pulse: 67 63 69 61  Resp: '20 17 20 19  '$ Temp:   97.7 F (36.5 C) 98.1 F (36.7 C)  TempSrc:   Oral Oral  SpO2: 99% 100% 100% 100%  Weight:   80.9 kg     Intake/Output Summary (Last 24 hours) at 12/25/2022 1005 Last data filed at 12/25/2022 0500 Gross per 24 hour  Intake 1301.93 ml  Output 400 ml  Net 901.93 ml   Filed Weights   12/25/22 0030 12/25/22 0404  Weight: 80.9 kg 80.9 kg    Examination:  General exam: Appears calm and comfortable  Respiratory system: Clear to auscultation. Respiratory effort normal. Cardiovascular system: S1 & S2 heard, RRR. No JVD, murmurs, rubs, gallops or clicks. No pedal edema. Gastrointestinal system: Abdomen is nondistended, soft and tenderness, most pronounced at right lower quadrant and somewhat around umbilicus,. No organomegaly or masses felt. Normal bowel sounds heard. Central nervous system: Alert and oriented. No focal neurological deficits. Extremities: Symmetric 5 x 5 power. Skin: No rashes, lesions or ulcers Psychiatry: Judgement and insight appear normal. Mood & affect appropriate.    Data Reviewed: I have personally reviewed following labs and imaging studies  CBC: Recent Labs  Lab 12/24/22 1445 12/25/22 0111  WBC 9.0 8.8  NEUTROABS 6.7  --   HGB 14.3 14.3  HCT 45.8 45.0  MCV 80.2 78.8*  PLT 160 254   Basic Metabolic Panel: Recent Labs  Lab  12/24/22 1445 12/24/22 1507 12/25/22 0111  NA 138  --  141  K 2.9*  --  3.2*  CL 103  --  107  CO2 25  --  26  GLUCOSE 126*  --  126*  BUN 10  --  8  CREATININE 1.56*  --  1.37*  CALCIUM 10.1  --  10.1  MG  --  2.3  --    GFR: Estimated Creatinine Clearance: 41.6 mL/min (A) (by C-G formula based on SCr of 1.37 mg/dL (H)). Liver Function Tests: Recent Labs  Lab 12/24/22 1445 12/25/22 0111  AST 54* 56*  ALT 34 40  ALKPHOS 70 65  BILITOT 1.0 0.9  PROT 6.6 6.6  ALBUMIN 3.2* 3.2*   No results for input(s): "LIPASE", "AMYLASE" in the  last 168 hours. No results for input(s): "AMMONIA" in the last 168 hours. Coagulation Profile: No results for input(s): "INR", "PROTIME" in the last 168 hours. Cardiac Enzymes: Recent Labs  Lab 12/24/22 1445 12/25/22 0111  CKTOTAL 1,514* 1,418*   BNP (last 3 results) No results for input(s): "PROBNP" in the last 8760 hours. HbA1C: No results for input(s): "HGBA1C" in the last 72 hours. CBG: No results for input(s): "GLUCAP" in the last 168 hours. Lipid Profile: No results for input(s): "CHOL", "HDL", "LDLCALC", "TRIG", "CHOLHDL", "LDLDIRECT" in the last 72 hours. Thyroid Function Tests: No results for input(s): "TSH", "T4TOTAL", "FREET4", "T3FREE", "THYROIDAB" in the last 72 hours. Anemia Panel: No results for input(s): "VITAMINB12", "FOLATE", "FERRITIN", "TIBC", "IRON", "RETICCTPCT" in the last 72 hours. Sepsis Labs: No results for input(s): "PROCALCITON", "LATICACIDVEN" in the last 168 hours.  Recent Results (from the past 240 hour(s))  Resp panel by RT-PCR (RSV, Flu A&B, Covid) Urine, Clean Catch     Status: None   Collection Time: 12/24/22  2:46 PM   Specimen: Urine, Clean Catch; Nasal Swab  Result Value Ref Range Status   SARS Coronavirus 2 by RT PCR NEGATIVE NEGATIVE Final   Influenza A by PCR NEGATIVE NEGATIVE Final   Influenza B by PCR NEGATIVE NEGATIVE Final    Comment: (NOTE) The Xpert Xpress SARS-CoV-2/FLU/RSV plus  assay is intended as an aid in the diagnosis of influenza from Nasopharyngeal swab specimens and should not be used as a sole basis for treatment. Nasal washings and aspirates are unacceptable for Xpert Xpress SARS-CoV-2/FLU/RSV testing.  Fact Sheet for Patients: EntrepreneurPulse.com.au  Fact Sheet for Healthcare Providers: IncredibleEmployment.be  This test is not yet approved or cleared by the [place] FDA and has been authorized for detection and/or diagnosis of SARS-CoV-2 by FDA under an Emergency Use Authorization (EUA). This EUA will remain in effect (meaning this test can be used) for the duration of the COVID-19 declaration under Section 564(b)(1) of the Act, 21 U.S.C. section 360bbb-3(b)(1), unless the authorization is terminated or revoked.     Resp Syncytial Virus by PCR NEGATIVE NEGATIVE Final    Comment: (NOTE) Fact Sheet for Patients: EntrepreneurPulse.com.au  Fact Sheet for Healthcare Providers: IncredibleEmployment.be  This test is not yet approved or cleared by the [place] FDA and has been authorized for detection and/or diagnosis of SARS-CoV-2 by FDA under an Emergency Use Authorization (EUA). This EUA will remain in effect (meaning this test can be used) for the duration of the COVID-19 declaration under Section 564(b)(1) of the Act, 21 U.S.C. section 360bbb-3(b)(1), unless the authorization is terminated or revoked.  Performed at [facility], [place] [address], [place], [place] [address]      Radiology Studies: CT Abdomen Pelvis W Contrast  Result Date: 12/24/2022 CLINICAL DATA:  Abdominal trauma EXAM: CT ABDOMEN AND PELVIS WITH CONTRAST TECHNIQUE: Multidetector CT imaging of the abdomen and pelvis was performed using the standard protocol following bolus administration of intravenous contrast. RADIATION DOSE REDUCTION: This exam was performed according to the  departmental dose-optimization program which includes automated exposure control, adjustment of the mA and/or kV according to patient size and/or use of iterative reconstruction technique. CONTRAST:  68m OMNIPAQUE IOHEXOL 350 MG/ML SOLN COMPARISON:  None Available. FINDINGS: Lower chest: No acute abnormality. Dependent bibasilar scarring and or atelectasis. Coronary artery calcifications and or stents. Hepatobiliary: No solid liver abnormality is seen. Gallstones. No gallbladder wall thickening, or biliary dilatation. Pancreas: Unremarkable. No pancreatic ductal dilatation or surrounding inflammatory changes. Spleen: Normal  in size without significant abnormality. Adrenals/Urinary Tract: Adrenal glands are unremarkable. Simple, benign right renal cortical cysts, for which no further follow-up or characterization is required. Kidneys are otherwise normal, without renal calculi, solid lesion, or hydronephrosis. Bladder is unremarkable. Stomach/Bowel: Stomach is within normal limits. Wall thickening and fat stranding about the appendix, which is fluid-filled and measures up to 0.9 cm in caliber (series 6, image 67). Vascular/Lymphatic: Aortic atherosclerosis. No enlarged abdominal or pelvic lymph nodes. Reproductive: No mass.  Prostate brachytherapy. Other: No abdominal wall hernia or abnormality. No ascites. Musculoskeletal: No acute or significant osseous findings. IMPRESSION: 1. No CT evidence of acute traumatic injury to the abdomen or pelvis. 2. Wall thickening and fat stranding about the appendix, which is fluid-filled and measures up to 0.9 cm in caliber. Findings are consistent with acute appendicitis. No evidence of complicating abscess or perforation. 3. Cholelithiasis. 4. Prostate brachytherapy. 5. Coronary artery disease. Aortic Atherosclerosis (ICD10-I70.0). Electronically Signed   By: Delanna Ahmadi M.D.   On: 12/24/2022 18:13   CT Cervical Spine Wo Contrast  Result Date: 12/24/2022 CLINICAL DATA:   Neck trauma EXAM: CT CERVICAL SPINE WITHOUT CONTRAST TECHNIQUE: Multidetector CT imaging of the cervical spine was performed without intravenous contrast. Multiplanar CT image reconstructions were also generated. RADIATION DOSE REDUCTION: This exam was performed according to the departmental dose-optimization program which includes automated exposure control, adjustment of the mA and/or kV according to patient size and/or use of iterative reconstruction technique. COMPARISON:  None Available. FINDINGS: Alignment: Straightening of the normal cervical lordosis. Skull base and vertebrae: No acute fracture. No primary bone lesion or focal pathologic process. Soft tissues and spinal canal: No prevertebral fluid or swelling. No visible canal hematoma. Disc levels: Moderate multilevel disc space height loss and ankylosis throughout. Posterior bridging osteophytosis, most notably at C3 through C5 (series 6, image 30). Upper chest: Negative. Other: None. IMPRESSION: 1. No fracture or static subluxation of the cervical spine. 2. Moderate multilevel disc space height loss and ankylosis throughout. 3. 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 MRI may be used to assess for central cervical canal pathology if desired. Electronically Signed   By: Delanna Ahmadi M.D.   On: 12/24/2022 18:09   CT Head Wo Contrast  Result Date: 12/24/2022 CLINICAL DATA:  Trauma EXAM: CT HEAD WITHOUT CONTRAST TECHNIQUE: Contiguous axial images were obtained from the base of the skull through the vertex without intravenous contrast. RADIATION DOSE REDUCTION: This exam was performed according to the departmental dose-optimization program which includes automated exposure control, adjustment of the mA and/or kV according to patient size and/or use of iterative reconstruction technique. COMPARISON:  CT head 03/25/2021 FINDINGS: Brain: No evidence of acute infarction, hemorrhage, hydrocephalus, extra-axial  collection or mass lesion/mass effect. Again seen is mild diffuse atrophy and mild periventricular white matter hypodensity, likely chronic small vessel ischemic change. Vascular: Atherosclerotic calcifications are present within the cavernous internal carotid arteries. Skull: Normal. Negative for fracture or focal lesion. Sinuses/Orbits: No acute finding. Other: None. IMPRESSION: 1. No acute intracranial abnormality. 2. Mild diffuse atrophy and mild chronic small vessel ischemic change. Electronically Signed   By: Ronney Asters M.D.   On: 12/24/2022 18:04   DG Pelvis 1-2 Views  Result Date: 12/24/2022 CLINICAL DATA:  Pelvic pain after fall. EXAM: PELVIS - 1-2 VIEW COMPARISON:  September 24, 2004. FINDINGS: There is no evidence of pelvic fracture or diastasis. No pelvic bone lesions are seen. Status post prostatic brachytherapy seed placement. IMPRESSION: Negative.  Electronically Signed   By: Marijo Conception M.D.   On: 12/24/2022 15:27   DG Chest 2 View  Result Date: 12/24/2022 CLINICAL DATA:  Fall EXAM: CHEST - 2 VIEW COMPARISON:  Chest x-ray 08/03/2022 FINDINGS: Cardiomediastinal silhouette appears stable. Is no focal lung infiltrate, pleural effusion or pneumothorax. No acute fractures are seen. IMPRESSION: No active cardiopulmonary disease. Electronically Signed   By: Ronney Asters M.D.   On: 12/24/2022 15:27    Scheduled Meds:  heparin  5,000 Units Subcutaneous Q8H   insulin aspart  0-9 Units Subcutaneous Q4H   losartan  100 mg Oral Daily   Continuous Infusions:  sodium chloride 150 mL/hr at 12/25/22 0745   cefTRIAXone (ROCEPHIN)  IV     And   metronidazole     potassium chloride       LOS: 1 day   Darliss Cheney, MD [facility]  12/25/2022, 10:05 AM   *Please note that this is a verbal dictation therefore any spelling or grammatical errors are due to the "[place] [employer]" system interpretation.  Please page via [place] and do not message via secure chat for urgent patient care  matters. Secure chat can be used for non urgent patient care matters.  How to contact the [facility] Attending or Consulting provider [place] or covering provider during after hours [place], for this patient?  Check the care team in [facility] and look for a) attending/consulting [facility] provider listed and b) the [facility] team listed. Page or secure chat [phone]. Log into [email or web address] and use [place]'s universal password to access. If you do not have the password, please contact the hospital operator. Locate the [facility] provider you are looking for under [facility] and page to a number that you can be directly reached. If you still have difficulty reaching the provider, please page the [facility] (Director on Call) for the Hospitalists listed on amion for assistance.

## 2022-12-25 NOTE — Progress Notes (Addendum)
Subjective No acute events. Still with RLQ pain. No n/v. Feeling well. States he is hungry. Remains motivated to have surgery to remove his appendix  Objective: Vital signs in last 24 hours: Temp:  [97.7 F (36.5 C)-98.3 F (36.8 C)] 98.1 F (36.7 C) (02/03 0800) Pulse Rate:  [56-75] 61 (02/03 1027) Resp:  [15-30] 23 (02/03 1027) BP: (164-214)/(68-114) 189/88 (02/03 1027) SpO2:  [92 %-100 %] 97 % (02/03 1027) Weight:  [80.9 kg] 80.9 kg (02/03 0404) Last BM Date : 12/22/22 (per pt (Wed or Thurs))  Intake/Output from previous day: 02/02 0701 - 02/03 0700 In: 1301.9 [I.V.:601.9; IV Piggyback:700] Out: 400 [Urine:400] Intake/Output this shift: No intake/output data recorded.  Gen: NAD, comfortable CV: RRR Pulm: Normal work of breathing Abd: Soft, ttp in RLQ; nondistended Ext: SCDs in place  Lab Results: CBC  Recent Labs    12/24/22 1445 12/25/22 0111  WBC 9.0 8.8  HGB 14.3 14.3  HCT 45.8 45.0  PLT 160 193   BMET Recent Labs    12/24/22 1445 12/25/22 0111  NA 138 141  K 2.9* 3.2*  CL 103 107  CO2 25 26  GLUCOSE 126* 126*  BUN 10 8  CREATININE 1.56* 1.37*  CALCIUM 10.1 10.1   PT/INR No results for input(s): "LABPROT", "INR" in the last 72 hours. ABG No results for input(s): "PHART", "HCO3" in the last 72 hours.  Invalid input(s): "PCO2", "PO2"  Studies/Results:  Anti-infectives: Anti-infectives (From admission, onward)    Start     Dose/Rate Route Frequency Ordered Stop   12/25/22 1900  [MAR Hold]  cefTRIAXone (ROCEPHIN) 2 g in sodium chloride 0.9 % 100 mL IVPB        (MAR Hold since Sat 12/25/2022 at 1018.Hold Reason: Transfer to a Procedural area)  See Hyperspace for full Linked Orders Report.   2 g 200 mL/hr over 30 Minutes Intravenous Every 24 hours 12/24/22 2224     12/25/22 1000  [MAR Hold]  metroNIDAZOLE (FLAGYL) IVPB 500 mg        (MAR Hold since Sat 12/25/2022 at 1018.Hold Reason: Transfer to a Procedural area)  See Hyperspace for full Linked  Orders Report.   500 mg 100 mL/hr over 60 Minutes Intravenous Every 12 hours 12/24/22 2224     12/24/22 1900  cefTRIAXone (ROCEPHIN) 2 g in sodium chloride 0.9 % 100 mL IVPB       See Hyperspace for full Linked Orders Report.   2 g 200 mL/hr over 30 Minutes Intravenous  Once 12/24/22 1855 12/24/22 2042   12/24/22 1900  metroNIDAZOLE (FLAGYL) IVPB 500 mg       See Hyperspace for full Linked Orders Report.   500 mg 100 mL/hr over 60 Minutes Intravenous  Once 12/24/22 1855 12/24/22 2043        Assessment/Plan: Patient Active Problem List   Diagnosis Date Noted   Acute appendicitis 12/24/2022   Osteoarthritis of right knee 09/01/2022   Hyperparathyroidism ([place]) 03/29/2022   Type 2 diabetes mellitus with stage 3b chronic kidney disease, without long-term current use of insulin ([place]) 03/29/2022   Hyperparathyroidism due to renal insufficiency ([place]) 05/14/2021   Syncope and collapse 03/25/2021   Sinus bradycardia 03/25/2021   Hypercalcemia 07/04/2020   Microalbuminuria due to type 2 diabetes mellitus ([place]) 06/02/2020   Murmur 11/07/2019   Insomnia 10/09/2018   Chronic kidney disease, stage 3a ([place]) 03/29/2018   Gout 04/12/2016   PTSD (post-traumatic stress disorder) 09/26/2015   Obesity (BMI 30.0-34.9) 09/26/2015   Vitamin  D deficiency 03/18/2014   Diet-controlled diabetes mellitus ([place]) 03/17/2014   Medication management 03/17/2014   RADIATION PROCTITIS 09/03/2008   ADENOCARCINOMA, PROSTATE 08/29/2008   COLONIC POLYPS 08/29/2008   Hyperlipidemia associated with type 2 diabetes mellitus ([place]) 08/29/2008   Essential hypertension 08/29/2008   Coronary atherosclerosis 08/29/2008   Diverticulosis of colon 08/29/2008   -Cr stable/baseline; adequate UOP overnight; mentation normal -The anatomy and physiology of the GI tract was discussed at length with him this morning. The pathophysiology of appendicitis was discussed as well. -We reviewed options moving forward for treatment,  covering IV abx vs surgery. We discussed that with antibiotics alone, there is reasonable success in managing appendicitis, however, risks of recurrence at 5yr being as high as 40% in some studies. We discussed appendectomy - laparoscopic and potential open techniques as well as scenarios where an ileocecectomy could be necessary. We discussed the material risks (including, but not limited to, pain, bleeding, infection, scarring, need for blood transfusion, damage to surrounding structures- blood vessels/nerves/viscus/organs, damage to ureter/bladder, urine leak, leak from staple line, need for additional procedures, hernia, recurrence although quite low, pneumonia, heart attack, stroke, death) benefits and alternatives to surgery were discussed. The patient's questions were answered to his satisfaction, he voiced understanding and elected to proceed with surgery. Additionally, we discussed typical postoperative expectations and the recovery process.   LOS: 1 day   I spent a total of 50 minutes in both face-to-face and non-face-to-face activities, excluding procedures performed, for this visit on the date of this encounter.  CNadeen Landau MK[facility], [place]

## 2022-12-25 NOTE — Anesthesia Preprocedure Evaluation (Signed)
Anesthesia Evaluation  Patient identified by MRN, date of birth, ID band Patient awake    Reviewed: Allergy & Precautions, NPO status , Patient's Chart, lab work & pertinent test results, reviewed documented beta blocker date and time   Airway Mallampati: II  TM Distance: >3 FB Neck ROM: Full    Dental  (+) Edentulous Upper, Missing, Dental Advisory Given   Pulmonary neg pulmonary ROS, former smoker   Pulmonary exam normal breath sounds clear to auscultation       Cardiovascular hypertension, Pt. on medications and Pt. on home beta blockers + CAD and + Cardiac Stents  Normal cardiovascular exam+ Valvular Problems/Murmurs  Rhythm:Regular Rate:Normal  Echo 03/2021  1. Left ventricular ejection fraction, by estimation, is 55 to 60%. The left ventricle has normal function. The left ventricle has no regional wall motion abnormalities. Left ventricular diastolic parameters are consistent with Grade I diastolic dysfunction (impaired relaxation).   2. Right ventricular systolic function is normal. The right ventricular size is normal.   3. The mitral valve is normal in structure. Trivial mitral valve regurgitation. No evidence of mitral stenosis.   4. The aortic valve is tricuspid. Aortic valve regurgitation is mild and eccentric. No aortic stenosis is present.   5. The inferior vena cava is normal in size with greater than 50% respiratory variability, suggesting right atrial pressure of 3 mmHg.      Neuro/Psych  PSYCHIATRIC DISORDERS Anxiety     negative neurological ROS     GI/Hepatic negative GI ROS, Neg liver ROS,,,  Endo/Other  diabetes    Renal/GU Renal disease     Musculoskeletal  (+) Arthritis ,    Abdominal   Peds  Hematology negative hematology ROS (+)   Anesthesia Other Findings   Reproductive/Obstetrics                              Anesthesia Physical Anesthesia Plan  ASA: 3 and  emergent  Anesthesia Plan: General   Post-op Pain Management: Ofirmev IV (intra-op)*   Induction: Intravenous, Rapid sequence and Cricoid pressure planned  PONV Risk Score and Plan: Ondansetron, Dexamethasone and Treatment may vary due to age or medical condition  Airway Management Planned: Oral ETT  Additional Equipment:   Intra-op Plan:   Post-operative Plan: Extubation in OR  Informed Consent: I have reviewed the patients History and Physical, chart, labs and discussed the procedure including the risks, benefits and alternatives for the proposed anesthesia with the patient or authorized representative who has indicated his/her understanding and acceptance.     Dental advisory given  Plan Discussed with: CRNA  Anesthesia Plan Comments:          Anesthesia Quick Evaluation

## 2022-12-25 NOTE — ED Provider Notes (Signed)
  Physical Exam  BP (!) 189/88   Pulse 61   Temp 98.1 F (36.7 C) (Oral)   Resp (!) 23   Ht '5\' 9"'$  (1.753 m)   Wt 80.9 kg   SpO2 97%   BMI 26.34 kg/m   Physical Exam Constitutional:      General: He is not in acute distress.    Appearance: Normal appearance.  HENT:     Head: Normocephalic and atraumatic.     Nose: No congestion or rhinorrhea.  Eyes:     General:        Right eye: No discharge.        Left eye: No discharge.     Extraocular Movements: Extraocular movements intact.     Pupils: Pupils are equal, round, and reactive to light.  Cardiovascular:     Rate and Rhythm: Normal rate and regular rhythm.     Heart sounds: No murmur heard. Pulmonary:     Effort: No respiratory distress.     Breath sounds: No wheezing or rales.  Abdominal:     General: There is no distension.     Tenderness: There is abdominal tenderness.  Musculoskeletal:        General: Normal range of motion.     Cervical back: Normal range of motion.  Skin:    General: Skin is warm and dry.  Neurological:     General: No focal deficit present.     Mental Status: He is alert.     Procedures  Procedures  ED Course / MDM   Clinical Course as of 12/25/22 1158  Fri Dec 24, 2022  1812 Talk to daughter at beside who stated that patient may have taken more of his nighttime meds than usual.  Patient confirmed and states that he may have taken more Lexapro than intended to last night. [CR]  1859 Patient's blood pressure contents elevated.  Will begin at home Cozaar. [CR]    Clinical Course User Index [CR] Wilnette Kales, PA   Medical Decision Making Amount and/or Complexity of Data Reviewed Labs: ordered. Radiology: ordered.  Risk Prescription drug management. Decision regarding hospitalization.   Patient received an handoff.  Patient found down and here in the emergency department patient has abdominal pain.  Workup concerning for acute appendicitis.  I spoke with Dr. Denyse Dago of  general surgery who is requesting medical admission with antibiotic therapy for medical optimization and possible nonop therapy of acute appendicitis based on patient's underlying risk factors.  Patient then admitted to medicine       Teressa Lower, MD 12/25/22 1200

## 2022-12-25 NOTE — Anesthesia Procedure Notes (Signed)
Procedure Name: Intubation Date/Time: 12/25/2022 11:44 AM  Performed by: Josephine Igo, CRNAPre-anesthesia Checklist: Patient identified, Suction available, Patient being monitored and Emergency Drugs available Patient Re-evaluated:Patient Re-evaluated prior to induction Oxygen Delivery Method: Circle system utilized Preoxygenation: Pre-oxygenation with 100% oxygen Induction Type: IV induction, Rapid sequence and Cricoid Pressure applied Laryngoscope Size: Miller and 2 Grade View: Grade I Tube type: Oral Tube size: 7.5 mm Number of attempts: 1 Airway Equipment and Method: Stylet Placement Confirmation: ETT inserted through vocal cords under direct vision and positive ETCO2 Secured at: 23 cm Tube secured with: Tape Dental Injury: Teeth and Oropharynx as per pre-operative assessment

## 2022-12-25 NOTE — Plan of Care (Signed)

## 2022-12-25 NOTE — Op Note (Signed)
[name] Colon [id number]   PRE-OPERATIVE DIAGNOSIS:  Acute appendicitis  POST-OPERATIVE DIAGNOSIS:  Perforated appendicitis with retroperitoneal abscess  Procedure(s): APPENDECTOMY LAPAROSCOPIC AND DRAINAGE OF INTRABDOMINAL ABSCESS  PROCEDURE:  Laparoscopic appendectomy Drainage of intra-abdominal abscess ~10 cc of pus  SURGEON:  Sharon [place] Knauer, M.D.  ASSISTANT: OR staff  ANESTHESIA: General endotracheal  EBL:   20 mL  DRAINS: 19 Fr round blake drain left draining the abscess cavityNone  SPECIMEN:  Appendix  COUNTS:  Sponge, needle and instrument counts were reported correct x2 at conclusion of the operation  DISPOSITION:  PACU in satisfactory condition  COMPLICATIONS: None  FINDINGS: Acutely inflamed appendix with abscess.  Appendix is partially retrocecal.  Abscesses along the right lateral/posterior lateral abdominal wall extending into the flank.  This was drained and approximately 10 cc of purulent fluid was evacuated.  Base of the appendix is healthy.  Appendectomy carried out.  33 French round Blake drain left draining the abscess cavity.  DESCRIPTION:   The patient was identified & brought into the operating room. SCDs were in place and functioning. General endotracheal anesthesia was administered. Preoperative antibiotics were administered. The patient was positioned supine with left arm tucked. Hair on the abdomen was then clipped by the OR team. A foley catheter was inserted under sterile conditions. The abdomen was prepped and draped in the standard sterile fashion. A surgical timeout confirmed our plan.  A small incision was made in the infraumbilical skin. The subcutaneous tissue was dissected and the umbilical stalk identified. The stalk was grasped with a Kocher and retracted outwardly. The infraumbilical fascia was exposed and incised. Peritoneal entry was carefully made bluntly. A 0 Vicryl purse-string suture was placed and then the [facility] port was  introduced into the abdomen.  CO2 insufflation commenced to 5mHg. The laparoscope was inserted and confirmed no evidence of trocar site complications. The patient was then positioned in Trendelenburg. Two additional ports were placed - one in left lower quadrant and another in the suprapubic midline taking care to stay well above the bladder - 3 fingerbreadths above the pubic symphysis. The bed was then slightly tilted to place the left side down.  The tip of the appendix and midportion are identified in the right lower quadrant.  Omentum and small bowel were reflected in the upper abdomen.  The appendix is visualized.  It is grasped and gently elevated.  It is bluntly freed from its surrounding attachments and in doing this, we immediately encountered an abscess collection that contained about 10 cc of purulent fluid.  This was evacuated with suction irrigator device.  The proximal portion of the appendiceal body is retrocecal in nature.  Staying immediately adjacent to the appendix, it is freed anteriorly sharply.  In doing this, we are able to bring the entire appendix into the abdominal cavity.    There is no fecal spillage but again there was a significant amount of pus.  Upon entering the abdomen (organ space), I encountered an abscess in the Right lower quadrant .  CASE DATA:  [facility]  Status of Case? URGENT Add On  Infection Present At Time Of Surgery (PATOS)?  ABSCESS in the right lower quadrant     The appendix was elevated.  The base of the appendix was circumferentially dissected taking care to preserve the cecum free of injury. The base was noted to be viable and healthy appearing. The terminal ileum, cecum and ascending colon also appeared normal. The base  of the appendix was then stapled with a blue load, taking a small healthy cuff of viable cecum, taking care to stay clear of the ileocecal valve. The mesoappendix was then  ligated by "hugging" the appendix using the harmonic scalpel. The mesoappendix was inspected and noted to be hemostatic. The appendix was placed in an EndoBag.  The right lower quadrant was conservatively irrigated. Hemostasis was noted to be achieved - taking time to inspect the ligated mesoappendix, colon mesentery, and retroperitoneum. Staple line was noted to be intact on the cecum with no bleeding. There was no perforation or injury.  Given the abscess cavity and the amount of inflammation around this area, we opted to proceed with placement of a 19 [place] round Blake drain.  This was done through our left lower quadrant 5 mm trocar site.  The drain is left drain in the right lower quadrant.  Omentum is then brought back down and all viscera returned to its anatomic position.  The drain is secured with a 2-0 nylon stitch.  The left lower quadrant and suprapubic ports were removed under direct visualization. The EndoBag was then removed through the umbilical port site and passed off as specimen. The CO2 was exhausted from the abdomen. The umbilical fascia was then closed by closing the 0 Vicryl suture. The fascia was palpated and noted to be completely closed. The skin of all port sites was then approximated using 4-0 Monocryl suture. The incisions were covered with Dermabond.  A drain dressing was created using a Biopatch and 4 x 4/tape.  He was then awakened from general anesthesia, extubated, and transferred to a stretcher for transport to recovery in satisfactory condition.

## 2022-12-26 DIAGNOSIS — K353 Acute appendicitis with localized peritonitis, without perforation or gangrene: Secondary | ICD-10-CM | POA: Diagnosis not present

## 2022-12-26 LAB — CBC WITH DIFFERENTIAL/PLATELET
Abs Immature Granulocytes: 0.06 10*3/uL (ref 0.00–0.07)
Basophils Absolute: 0 10*3/uL (ref 0.0–0.1)
Basophils Relative: 0 %
Eosinophils Absolute: 0 10*3/uL (ref 0.0–0.5)
Eosinophils Relative: 0 %
HCT: 42.2 % (ref 39.0–52.0)
Hemoglobin: 13.5 g/dL (ref 13.0–17.0)
Immature Granulocytes: 0 %
Lymphocytes Relative: 13 %
Lymphs Abs: 1.7 10*3/uL (ref 0.7–4.0)
MCH: 25 pg — ABNORMAL LOW (ref 26.0–34.0)
MCHC: 32 g/dL (ref 30.0–36.0)
MCV: 78.1 fL — ABNORMAL LOW (ref 80.0–100.0)
Monocytes Absolute: 1.3 10*3/uL — ABNORMAL HIGH (ref 0.1–1.0)
Monocytes Relative: 10 %
Neutro Abs: 10.2 10*3/uL — ABNORMAL HIGH (ref 1.7–7.7)
Neutrophils Relative %: 77 %
Platelets: 223 10*3/uL (ref 150–400)
RBC: 5.4 MIL/uL (ref 4.22–5.81)
RDW: 16.3 % — ABNORMAL HIGH (ref 11.5–15.5)
WBC: 13.3 10*3/uL — ABNORMAL HIGH (ref 4.0–10.5)
nRBC: 0 % (ref 0.0–0.2)

## 2022-12-26 LAB — COMPREHENSIVE METABOLIC PANEL
ALT: 29 U/L (ref 0–44)
AST: 28 U/L (ref 15–41)
Albumin: 2.7 g/dL — ABNORMAL LOW (ref 3.5–5.0)
Alkaline Phosphatase: 59 U/L (ref 38–126)
Anion gap: 8 (ref 5–15)
BUN: 15 mg/dL (ref 8–23)
CO2: 24 mmol/L (ref 22–32)
Calcium: 10.4 mg/dL — ABNORMAL HIGH (ref 8.9–10.3)
Chloride: 110 mmol/L (ref 98–111)
Creatinine, Ser: 1.49 mg/dL — ABNORMAL HIGH (ref 0.61–1.24)
GFR, Estimated: 47 mL/min — ABNORMAL LOW (ref >60)
Glucose, Bld: 135 mg/dL — ABNORMAL HIGH (ref 70–99)
Potassium: 3.5 mmol/L (ref 3.5–5.1)
Sodium: 142 mmol/L (ref 135–145)
Total Bilirubin: 0.6 mg/dL (ref 0.3–1.2)
Total Protein: 6.1 g/dL — ABNORMAL LOW (ref 6.5–8.1)

## 2022-12-26 LAB — GLUCOSE, CAPILLARY
Glucose-Capillary: 115 mg/dL — ABNORMAL HIGH (ref 70–99)
Glucose-Capillary: 128 mg/dL — ABNORMAL HIGH (ref 70–99)
Glucose-Capillary: 135 mg/dL — ABNORMAL HIGH (ref 70–99)
Glucose-Capillary: 150 mg/dL — ABNORMAL HIGH (ref 70–99)
Glucose-Capillary: 163 mg/dL — ABNORMAL HIGH (ref 70–99)
Glucose-Capillary: 168 mg/dL — ABNORMAL HIGH (ref 70–99)
Glucose-Capillary: 176 mg/dL — ABNORMAL HIGH (ref 70–99)

## 2022-12-26 LAB — MAGNESIUM: Magnesium: 2.3 mg/dL (ref 1.7–2.4)

## 2022-12-26 MED ORDER — TRAMADOL HCL 50 MG PO TABS
50.0000 mg | ORAL_TABLET | Freq: Four times a day (QID) | ORAL | Status: DC | PRN
  Administered 2022-12-27: 50 mg via ORAL
  Filled 2022-12-26: qty 1

## 2022-12-26 MED ORDER — ZOLPIDEM TARTRATE 5 MG PO TABS
5.0000 mg | ORAL_TABLET | Freq: Once | ORAL | Status: AC
  Administered 2022-12-26: 5 mg via ORAL
  Filled 2022-12-26: qty 1

## 2022-12-26 MED ORDER — POTASSIUM CHLORIDE CRYS ER 20 MEQ PO TBCR
40.0000 meq | EXTENDED_RELEASE_TABLET | Freq: Once | ORAL | Status: AC
  Administered 2022-12-26: 40 meq via ORAL
  Filled 2022-12-26 (×2): qty 2

## 2022-12-26 MED ORDER — ACETAMINOPHEN 500 MG PO TABS
1000.0000 mg | ORAL_TABLET | Freq: Four times a day (QID) | ORAL | Status: DC
  Administered 2022-12-26 – 2022-12-28 (×8): 1000 mg via ORAL
  Filled 2022-12-26 (×9): qty 2

## 2022-12-26 NOTE — Progress Notes (Signed)
PROGRESS NOTE    [name] Colon  ZOX:[id number] DOB: [birth year] DOA: 12/24/2022 PCP: Unk Pinto, MD   Brief Narrative:  HPI: [name] Colon is a 82 y.o. male with medical history significant of  Adenocarcinoma of prostate s/p radiation seeds, CAD s/p stent, essential hypertension, DMII diet controlled,  PTSD/anxiety /depression, who gets care at [facility] presents to ED s/p being found down at home. Per patient unable to give full history of how he fell. However he notes he was too weak get up. He notes for the past 3 days he has been ill with right lower quadrant pain and global weakness unable to get out of bed He also noted low appetite x 1 week but states he was still able to drink and eat. He also note constipation x 1 week as well.  He denies fever/chills/ n/v/dysuria/ sob/ chest pain  or dysuria.     ED Course:  Afeb, BP 176/85, rr16, pul 61  Wbc 9hgb 14.3,  plt 160 ,  NA138, K 2.9, glu 126, cr 1.56 at baseline ( 1.6-1.3)\ CK 1514 Resp panel neg  Cxr: NAD  Pelvis: NAD  EKG: snr /pac WT 500 UA large hgb no rbc CTH NAD Cervical spine . No fracture or static subluxation of the cervical spine. 2. Moderate multilevel disc space height loss and ankylosis throughout. 3. 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 MRI may be used to assess for central cervical canal pathology if desired. Tx potassium 40 meq  nas 500cc  Tx ctx/metroniazole  Assessment & Plan:   Principal Problem:   Acute appendicitis Active Problems:   Essential hypertension   Diet-controlled diabetes mellitus (HCC)   Chronic kidney disease, stage 3a ([facility])  Acute perforated appendicitis with retroperitoneal abscess: seen by general surgery, and today they recommended antibiotic but eventually patient underwent laparoscopic appendectomy on 12/25/2022 by Dr. Dema Severin.  Patient on Rocephin and Flagyl.  He is on soft diet.  Management per general surgery.  Nontraumatic  rhabdomyolysis: Continue IV fluids and monitor CPK.  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  Adenocarcinoma prostate: S/p radiation seeds, no active issues.  Follow-up outpatient.  Essential hypertension: Uncontrolled, blood pressure elevated.  Continue losartan and IV hydralazine as needed.  Type 2 diabetes mellitus: Appears to be diet controlled, recent hemoglobin A1c 5.8, continue SSI.  Hypokalemia: Resolved but at low normal side so I will replace again to prevent hypokalemia.  DVT prophylaxis: heparin injection 5,000 Units Start: 12/25/22 0600 SCDs Start: 12/24/22 2316   Code Status: Full Code  Family Communication:  None present at bedside.  Plan of care discussed with patient in length and he/she verbalized understanding and agreed with it.  Status is: Inpatient Remains inpatient appropriate because: Advancing diet.   Estimated body mass index is 27.74 kg/m as calculated from the following:   Height as of this encounter: '5\' 9"'$  (1.753 m).   Weight as of this encounter: 85.2 kg.    Nutritional Assessment: Body mass index is 27.74 kg/m.Marland Kitchen Seen by dietician.  I agree with the assessment and plan as outlined below: Nutrition Status:        . Skin Assessment: I have examined the patient's skin and I agree with the wound assessment as performed by the wound care RN as outlined below:    Consultants:  General surgery  Procedures:  None  Antimicrobials:  Anti-infectives (From admission, onward)    Start     Dose/Rate Route Frequency Ordered Stop  12/25/22 1900  cefTRIAXone (ROCEPHIN) 2 g in sodium chloride 0.9 % 100 mL IVPB       See Hyperspace for full Linked Orders Report.   2 g 200 mL/hr over 30 Minutes Intravenous Every 24 hours 12/24/22 2224     12/25/22 1000  metroNIDAZOLE (FLAGYL) IVPB 500 mg       See Hyperspace for full Linked Orders Report.   500 mg 100 mL/hr over 60 Minutes Intravenous Every 12 hours 12/24/22 2224     12/24/22 1900  cefTRIAXone (ROCEPHIN) 2 g  in sodium chloride 0.9 % 100 mL IVPB       See Hyperspace for full Linked Orders Report.   2 g 200 mL/hr over 30 Minutes Intravenous  Once 12/24/22 1855 12/24/22 2042   12/24/22 1900  metroNIDAZOLE (FLAGYL) IVPB 500 mg       See Hyperspace for full Linked Orders Report.   500 mg 100 mL/hr over 60 Minutes Intravenous  Once 12/24/22 1855 12/24/22 2043         Subjective:  Patient seen and examined.  He feels better.  Denied any abdominal pain.  Objective: Vitals:   12/26/22 0200 12/26/22 0400 12/26/22 0600 12/26/22 0800  BP:  (!) 151/69  (!) 161/78  Pulse: 80 67 68 (!) 55  Resp: 20 (!) '21 16 20  '$ Temp:  98.3 F (36.8 C)  97.6 F (36.4 C)  TempSrc:  Oral  Oral  SpO2: 98% 97% 99% 99%  Weight:  85.2 kg    Height:        Intake/Output Summary (Last 24 hours) at 12/26/2022 1016 Last data filed at 12/26/2022 0400 Gross per 24 hour  Intake 3048.59 ml  Output 1940 ml  Net 1108.59 ml    Filed Weights   12/25/22 0030 12/25/22 0404 12/26/22 0400  Weight: 80.9 kg 80.9 kg 85.2 kg    Examination:  General exam: Appears calm and comfortable  Respiratory system: Clear to auscultation. Respiratory effort normal. Cardiovascular system: S1 & S2 heard, RRR. No JVD, murmurs, rubs, gallops or clicks. No pedal edema. Gastrointestinal system: Abdomen is nondistended, soft and generalized tenderness but more pronounced at right lower quadrant and periumbilical area. No organomegaly or masses felt. Normal bowel sounds heard. Central nervous system: Alert and oriented. No focal neurological deficits. Extremities: Symmetric 5 x 5 power. Skin: No rashes, lesions or ulcers.  Psychiatry: Judgement and insight appear normal. Mood & affect appropriate.   Data Reviewed: I have personally reviewed following labs and imaging studies  CBC: Recent Labs  Lab 12/24/22 1445 12/25/22 0111 12/26/22 0807  WBC 9.0 8.8 13.3*  NEUTROABS 6.7  --  10.2*  HGB 14.3 14.3 13.5  HCT 45.8 45.0 42.2  MCV 80.2  78.8* 78.1*  PLT 160 193 062    Basic Metabolic Panel: Recent Labs  Lab 12/24/22 1445 12/24/22 1507 12/25/22 0111 12/26/22 0807  NA 138  --  141 142  K 2.9*  --  3.2* 3.5  CL 103  --  107 110  CO2 25  --  26 24  GLUCOSE 126*  --  126* 135*  BUN 10  --  8 15  CREATININE 1.56*  --  1.37* 1.49*  CALCIUM 10.1  --  10.1 10.4*  MG  --  2.3  --  2.3    GFR: Estimated Creatinine Clearance: 41.4 mL/min (A) (by C-G formula based on SCr of 1.49 mg/dL (H)). Liver Function Tests: Recent Labs  Lab 12/24/22 1445 12/25/22 0111 12/26/22  0807  AST 54* 56* 28  ALT 34 40 29  ALKPHOS 70 65 59  BILITOT 1.0 0.9 0.6  PROT 6.6 6.6 6.1*  ALBUMIN 3.2* 3.2* 2.7*    No results for input(s): "LIPASE", "AMYLASE" in the last 168 hours. No results for input(s): "AMMONIA" in the last 168 hours. Coagulation Profile: No results for input(s): "INR", "PROTIME" in the last 168 hours. Cardiac Enzymes: Recent Labs  Lab 12/24/22 1445 12/25/22 0111  CKTOTAL 1,514* 1,418*    BNP (last 3 results) No results for input(s): "PROBNP" in the last 8760 hours. HbA1C: Recent Labs    12/25/22 1002  HGBA1C 5.8*   CBG: Recent Labs  Lab 12/25/22 1703 12/25/22 2035 12/26/22 0002 12/26/22 0440 12/26/22 0845  GLUCAP 133* 156* 176* 135* 128*   Lipid Profile: No results for input(s): "CHOL", "HDL", "LDLCALC", "TRIG", "CHOLHDL", "LDLDIRECT" in the last 72 hours. Thyroid Function Tests: No results for input(s): "TSH", "T4TOTAL", "FREET4", "T3FREE", "THYROIDAB" in the last 72 hours. Anemia Panel: No results for input(s): "VITAMINB12", "FOLATE", "FERRITIN", "TIBC", "IRON", "RETICCTPCT" in the last 72 hours. Sepsis Labs: No results for input(s): "PROCALCITON", "LATICACIDVEN" in the last 168 hours.  Recent Results (from the past 240 hour(s))  Resp panel by RT-PCR (RSV, Flu A&B, Covid) Urine, Clean Catch     Status: None   Collection Time: 12/24/22  2:46 PM   Specimen: Urine, Clean Catch; Nasal Swab   Result Value Ref Range Status   SARS Coronavirus 2 by RT PCR NEGATIVE NEGATIVE Final   Influenza A by PCR NEGATIVE NEGATIVE Final   Influenza B by PCR NEGATIVE NEGATIVE Final    Comment: (NOTE) The Xpert Xpress SARS-CoV-2/FLU/RSV plus assay is intended as an aid in the diagnosis of influenza from Nasopharyngeal swab specimens and should not be used as a sole basis for treatment. Nasal washings and aspirates are unacceptable for Xpert Xpress SARS-CoV-2/FLU/RSV testing.  Fact Sheet for Patients: EntrepreneurPulse.com.au  Fact Sheet for Healthcare Providers: IncredibleEmployment.be  This test is not yet approved or cleared by the [place] FDA and has been authorized for detection and/or diagnosis of SARS-CoV-2 by FDA under an Emergency Use Authorization (EUA). This EUA will remain in effect (meaning this test can be used) for the duration of the COVID-19 declaration under Section 564(b)(1) of the Act, 21 U.S.C. section 360bbb-3(b)(1), unless the authorization is terminated or revoked.     Resp Syncytial Virus by PCR NEGATIVE NEGATIVE Final    Comment: (NOTE) Fact Sheet for Patients: EntrepreneurPulse.com.au  Fact Sheet for Healthcare Providers: IncredibleEmployment.be  This test is not yet approved or cleared by the [place] FDA and has been authorized for detection and/or diagnosis of SARS-CoV-2 by FDA under an Emergency Use Authorization (EUA). This EUA will remain in effect (meaning this test can be used) for the duration of the COVID-19 declaration under Section 564(b)(1) of the Act, 21 U.S.C. section 360bbb-3(b)(1), unless the authorization is terminated or revoked.  Performed at [facility], [place] [address], [place], [place] [address]      Radiology Studies: CT Abdomen Pelvis W Contrast  Result Date: 12/24/2022 CLINICAL DATA:  Abdominal trauma EXAM: CT ABDOMEN AND PELVIS WITH  CONTRAST TECHNIQUE: Multidetector CT imaging of the abdomen and pelvis was performed using the standard protocol following bolus administration of intravenous contrast. RADIATION DOSE REDUCTION: This exam was performed according to the departmental dose-optimization program which includes automated exposure control, adjustment of the mA and/or kV according to patient size and/or use of iterative reconstruction  technique. CONTRAST:  47m OMNIPAQUE IOHEXOL 350 MG/ML SOLN COMPARISON:  None Available. FINDINGS: Lower chest: No acute abnormality. Dependent bibasilar scarring and or atelectasis. Coronary artery calcifications and or stents. Hepatobiliary: No solid liver abnormality is seen. Gallstones. No gallbladder wall thickening, or biliary dilatation. Pancreas: Unremarkable. No pancreatic ductal dilatation or surrounding inflammatory changes. Spleen: Normal in size without significant abnormality. Adrenals/Urinary Tract: Adrenal glands are unremarkable. Simple, benign right renal cortical cysts, for which no further follow-up or characterization is required. Kidneys are otherwise normal, without renal calculi, solid lesion, or hydronephrosis. Bladder is unremarkable. Stomach/Bowel: Stomach is within normal limits. Wall thickening and fat stranding about the appendix, which is fluid-filled and measures up to 0.9 cm in caliber (series 6, image 67). Vascular/Lymphatic: Aortic atherosclerosis. No enlarged abdominal or pelvic lymph nodes. Reproductive: No mass.  Prostate brachytherapy. Other: No abdominal wall hernia or abnormality. No ascites. Musculoskeletal: No acute or significant osseous findings. IMPRESSION: 1. No CT evidence of acute traumatic injury to the abdomen or pelvis. 2. Wall thickening and fat stranding about the appendix, which is fluid-filled and measures up to 0.9 cm in caliber. Findings are consistent with acute appendicitis. No evidence of complicating abscess or perforation. 3. Cholelithiasis. 4.  Prostate brachytherapy. 5. Coronary artery disease. Aortic Atherosclerosis (ICD10-I70.0). Electronically Signed   By: ADelanna AhmadiM.D.   On: 12/24/2022 18:13   CT Cervical Spine Wo Contrast  Result Date: 12/24/2022 CLINICAL DATA:  Neck trauma EXAM: CT CERVICAL SPINE WITHOUT CONTRAST TECHNIQUE: Multidetector CT imaging of the cervical spine was performed without intravenous contrast. Multiplanar CT image reconstructions were also generated. RADIATION DOSE REDUCTION: This exam was performed according to the departmental dose-optimization program which includes automated exposure control, adjustment of the mA and/or kV according to patient size and/or use of iterative reconstruction technique. COMPARISON:  None Available. FINDINGS: Alignment: Straightening of the normal cervical lordosis. Skull base and vertebrae: No acute fracture. No primary bone lesion or focal pathologic process. Soft tissues and spinal canal: No prevertebral fluid or swelling. No visible canal hematoma. Disc levels: Moderate multilevel disc space height loss and ankylosis throughout. Posterior bridging osteophytosis, most notably at C3 through C5 (series 6, image 30). Upper chest: Negative. Other: None. IMPRESSION: 1. No fracture or static subluxation of the cervical spine. 2. Moderate multilevel disc space height loss and ankylosis throughout. 3. 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 MRI may be used to assess for central cervical canal pathology if desired. Electronically Signed   By: ADelanna AhmadiM.D.   On: 12/24/2022 18:09   CT Head Wo Contrast  Result Date: 12/24/2022 CLINICAL DATA:  Trauma EXAM: CT HEAD WITHOUT CONTRAST TECHNIQUE: Contiguous axial images were obtained from the base of the skull through the vertex without intravenous contrast. RADIATION DOSE REDUCTION: This exam was performed according to the departmental dose-optimization program which includes automated exposure  control, adjustment of the mA and/or kV according to patient size and/or use of iterative reconstruction technique. COMPARISON:  CT head 03/25/2021 FINDINGS: Brain: No evidence of acute infarction, hemorrhage, hydrocephalus, extra-axial collection or mass lesion/mass effect. Again seen is mild diffuse atrophy and mild periventricular white matter hypodensity, likely chronic small vessel ischemic change. Vascular: Atherosclerotic calcifications are present within the cavernous internal carotid arteries. Skull: Normal. Negative for fracture or focal lesion. Sinuses/Orbits: No acute finding. Other: None. IMPRESSION: 1. No acute intracranial abnormality. 2. Mild diffuse atrophy and mild chronic small vessel ischemic change. Electronically Signed   By: AWarren Lacy  Dagoberto Reef M.D.   On: 12/24/2022 18:04   DG Pelvis 1-2 Views  Result Date: 12/24/2022 CLINICAL DATA:  Pelvic pain after fall. EXAM: PELVIS - 1-2 VIEW COMPARISON:  September 24, 2004. FINDINGS: There is no evidence of pelvic fracture or diastasis. No pelvic bone lesions are seen. Status post prostatic brachytherapy seed placement. IMPRESSION: Negative. Electronically Signed   By: Marijo Conception M.D.   On: 12/24/2022 15:27   DG Chest 2 View  Result Date: 12/24/2022 CLINICAL DATA:  Fall EXAM: CHEST - 2 VIEW COMPARISON:  Chest x-ray 08/03/2022 FINDINGS: Cardiomediastinal silhouette appears stable. Is no focal lung infiltrate, pleural effusion or pneumothorax. No acute fractures are seen. IMPRESSION: No active cardiopulmonary disease. Electronically Signed   By: Ronney Asters M.D.   On: 12/24/2022 15:27    Scheduled Meds:  heparin  5,000 Units Subcutaneous Q8H   insulin aspart  0-9 Units Subcutaneous Q4H   losartan  100 mg Oral Daily   Continuous Infusions:  cefTRIAXone (ROCEPHIN)  IV Stopped (12/25/22 1849)   And   metronidazole 500 mg (12/26/22 0942)     LOS: 2 days   Darliss Cheney, MD [facility]  12/26/2022, 10:16 AM   *Please note that this  is a verbal dictation therefore any spelling or grammatical errors are due to the "[place]" system interpretation.  Please page via [place] and do not message via secure chat for urgent patient care matters. Secure chat can be used for non urgent patient care matters.  How to contact the [facility] Attending or Consulting provider [place] or covering provider during after hours [place], for this patient?  Check the care team in [facility] and look for a) attending/consulting TRH provider listed and b) the [facility] team listed. Page or secure chat [phone]. Log into [email or web address] and use [place]'s universal password to access. If you do not have the password, please contact the hospital operator. Locate the [facility] provider you are looking for under [facility] and page to a number that you can be directly reached. If you still have difficulty reaching the provider, please page the [facility] (Director on Call) for the Hospitalists listed on amion for assistance.

## 2022-12-26 NOTE — Evaluation (Signed)
Physical Therapy Evaluation Patient Details Name: [name] Colon MRN: [id number] DOB: 1941-01-20 Today's Date: 12/26/2022  History of Present Illness  82 y.o. male presents to ED s/p being found down at home. +acute appendicitis; rhabdomyolysis; 12/25/22 laparoscopic appendectomy and drainage of intrabdominal abscess;  PMH significant of   Adenocarcinoma of prostate s/p radiation seeds, CAD s/p stent, essential hypertension, DMII diet controlled,  PTSD/anxiety /depression, who gets care at [place]   Pt admitted secondary to problem above with deficits below. PTA patient was living with wife in multi-level home with 2 steps without rail to enter. He had recovered from TKR and was walking mostly without RW and able to ascend 16 steps up to his bonus room on 2nd level. (He can stay on 1st level where his bed/bath/kitchen are).  Pt currently requires min assist for bed mobility and minguard for transfers and gait with RW. Patient does not want to go to rehab prior to discharge home and anticipate with 1-2 more days of PT and general activity with nursing and mobility team that he will be ready to return home.  Anticipate patient will benefit from PT to address problems listed below.Will continue to follow acutely to maximize functional mobility independence and safety.          Recommendations for follow up therapy are one component of a multi-disciplinary discharge planning process, led by the attending physician.  Recommendations may be updated based on patient status, additional functional criteria and insurance authorization.  Follow Up Recommendations No PT follow up (however will need 1-2 days acute PT prior to discharge home)      Assistance Recommended at Discharge PRN  Patient can return home with the following  Assistance with cooking/housework;Help with stairs or ramp for entrance    Equipment Recommendations None recommended by PT  Recommendations for Other Services        Functional Status Assessment Patient has had a recent decline in their functional status and demonstrates the ability to make significant improvements in function in a reasonable and predictable amount of time.     Precautions / Restrictions Precautions Precautions: Fall Restrictions Weight Bearing Restrictions: No      Mobility  Bed Mobility Overal bed mobility: Needs Assistance Bed Mobility: Rolling, Sidelying to Sit Rolling: Min assist Sidelying to sit: Min assist, HOB elevated       General bed mobility comments: HOB 20; assist to fully roll onto left side; assist to raise torso to full upright sitting; incr time and effort to scoot out to EOB and get both feet on the floor    Transfers Overall transfer level: Needs assistance Equipment used: Rolling walker (2 wheels) Transfers: Sit to/from Stand Sit to Stand: Min guard, From elevated surface (elevated bed to simulate bed at home)           General transfer comment: vc for hand placement; no physical assist but guarding for safety as 1st time up post-op    Ambulation/Gait Ambulation/Gait assistance: Min guard Gait Distance (Feet): 15 Feet Assistive device: Rolling walker (2 wheels) Gait Pattern/deviations: Step-through pattern, Decreased stride length, Trunk flexed   Gait velocity interpretation: <1.31 ft/sec, indicative of household ambulator   General Gait Details: vc for walker proximity and upright posture; initial "bobbing" up/down with knees, but improved as started to walk  Stairs            Wheelchair Mobility    Modified Rankin (Stroke Patients Only)       Balance Overall  balance assessment: Needs assistance Sitting-balance support: No upper extremity supported, Feet supported Sitting balance-Leahy Scale: Good     Standing balance support: Bilateral upper extremity supported, During functional activity, Reliant on assistive device for balance Standing balance-Leahy Scale: Poor                                Pertinent Vitals/Pain Pain Assessment Pain Assessment: No/denies pain    Home Living Family/patient expects to be discharged to:: Private residence Living Arrangements: Spouse/significant other Available Help at Discharge: Family;Available 24 hours/day Type of Home: House Home Access: Stairs to enter Entrance Stairs-Rails: None Entrance Stairs-Number of Steps: 2 Alternate Level Stairs-Number of Steps: 16 Home Layout: Multi-level;Able to live on main level with bedroom/bathroom Home Equipment: Rankin - single Barista (2 wheels)      Prior Function Prior Level of Function : Independent/Modified Independent             Mobility Comments: using RW whenever he thinks of it ADLs Comments: no assist     Hand Dominance   Dominant Hand: Right    Extremity/Trunk Assessment   Upper Extremity Assessment Upper Extremity Assessment: Overall WFL for tasks assessed    Lower Extremity Assessment Lower Extremity Assessment: RLE deficits/detail RLE Deficits / Details: recent TKR with flexion ~90 degrees, strength 4/5    Cervical / Trunk Assessment Cervical / Trunk Assessment: Normal  Communication   Communication: No difficulties  Cognition Arousal/Alertness: Awake/alert Behavior During Therapy: Flat affect Overall Cognitive Status: Within Functional Limits for tasks assessed                                 General Comments: a&o x4        General Comments General comments (skin integrity, edema, etc.): VSS per monitor    Exercises General Exercises - Lower Extremity Ankle Circles/Pumps: AROM, Both, 5 reps Long Arc Quad: AROM, Right, 5 reps   Assessment/Plan    PT Assessment Patient needs continued PT services  PT Problem List Decreased strength;Decreased range of motion;Decreased activity tolerance;Decreased balance;Decreased mobility;Decreased knowledge of use of DME;Pain       PT Treatment  Interventions DME instruction;Gait training;Stair training;Functional mobility training;Therapeutic activities;Therapeutic exercise;Patient/family education    PT Goals (Current goals can be found in the Care Plan section)  Acute Rehab PT Goals Patient Stated Goal: return home and not go to rehab PT Goal Formulation: With patient Time For Goal Achievement: 01/09/23 Potential to Achieve Goals: Good    Frequency Min 3X/week     Co-evaluation               AM-PAC PT "6 Clicks" Mobility  Outcome Measure Help needed turning from your back to your side while in a flat bed without using bedrails?: A Little Help needed moving from lying on your back to sitting on the side of a flat bed without using bedrails?: A Little Help needed moving to and from a bed to a chair (including a wheelchair)?: A Little Help needed standing up from a chair using your arms (e.g., wheelchair or bedside chair)?: A Little Help needed to walk in hospital room?: A Little Help needed climbing 3-5 steps with a railing? : A Little 6 Click Score: 18    End of Session Equipment Utilized During Treatment: Gait belt Activity Tolerance: Patient tolerated treatment well Patient left: in chair;with call bell/phone within  reach;with chair alarm set Nurse Communication: Mobility status PT Visit Diagnosis: Other abnormalities of gait and mobility (R26.89);History of falling (Z91.81)    Time: 8412-8208 PT Time Calculation (min) (ACUTE ONLY): 28 min   Charges:   PT Evaluation $PT Eval Low Complexity: 1 Low PT Treatments $Gait Training: 8-22 mins         Arby Barrette, PT Acute Rehabilitation Services  Office [phone]   Rexanne Mano 12/26/2022, 1:20 PM

## 2022-12-26 NOTE — Progress Notes (Signed)
1 Day Post-Op  Subjective: Patient feeling well today.  Pain well controlled.  tolerating a solid diet.  Hasn't mobilized yet since surgery.  Voiding well.  ROS: See above, otherwise other systems negative  Objective: Vital signs in last 24 hours: Temp:  [97.5 F (36.4 C)-98.4 F (36.9 C)] 97.6 F (36.4 C) (02/04 0800) Pulse Rate:  [53-98] 55 (02/04 0800) Resp:  [16-24] 20 (02/04 0800) BP: (140-195)/(66-88) 161/78 (02/04 0800) SpO2:  [94 %-100 %] 99 % (02/04 0800) Weight:  [85.2 kg] 85.2 kg (02/04 0400) Last BM Date : 12/23/22  Intake/Output from previous day: 02/03 0701 - 02/04 0700 In: 3048.6 [P.O.:720; I.V.:2031.2; IV Piggyback:297.4] Out: 1941 [QMVHQ:4696; Drains:140; Blood:25] Intake/Output this shift: No intake/output data recorded.  PE: Abd: soft, appropriately tender, +BS, incisions c/d/I, JP with some cloudy, purulent, bloody drainage  Lab Results:  Recent Labs    12/25/22 0111 12/26/22 0807  WBC 8.8 13.3*  HGB 14.3 13.5  HCT 45.0 42.2  PLT 193 223   BMET Recent Labs    12/25/22 0111 12/26/22 0807  NA 141 142  K 3.2* 3.5  CL 107 110  CO2 26 24  GLUCOSE 126* 135*  BUN 8 15  CREATININE 1.37* 1.49*  CALCIUM 10.1 10.4*   PT/INR No results for input(s): "LABPROT", "INR" in the last 72 hours. CMP     Component Value Date/Time   NA 142 12/26/2022 0807   NA 139 04/06/2021 1121   K 3.5 12/26/2022 0807   CL 110 12/26/2022 0807   CO2 24 12/26/2022 0807   GLUCOSE 135 (H) 12/26/2022 0807   BUN 15 12/26/2022 0807   BUN 18 04/06/2021 1121   CREATININE 1.49 (H) 12/26/2022 0807   CREATININE 1.61 (H) 11/25/2022 1126   CALCIUM 10.4 (H) 12/26/2022 0807   PROT 6.1 (L) 12/26/2022 0807   PROT 7.2 01/02/2021 0934   ALBUMIN 2.7 (L) 12/26/2022 0807   ALBUMIN 4.7 01/02/2021 0934   AST 28 12/26/2022 0807   ALT 29 12/26/2022 0807   ALKPHOS 59 12/26/2022 0807   BILITOT 0.6 12/26/2022 0807   BILITOT 0.4 01/02/2021 0934   GFRNONAA 47 (L) 12/26/2022 0807    GFRNONAA 42 (L) 05/13/2021 0959   GFRAA 49 (L) 05/13/2021 0959   Lipase  No results found for: "LIPASE"     Studies/Results: CT Abdomen Pelvis W Contrast  Result Date: 12/24/2022 CLINICAL DATA:  Abdominal trauma EXAM: CT ABDOMEN AND PELVIS WITH CONTRAST TECHNIQUE: Multidetector CT imaging of the abdomen and pelvis was performed using the standard protocol following bolus administration of intravenous contrast. RADIATION DOSE REDUCTION: This exam was performed according to the departmental dose-optimization program which includes automated exposure control, adjustment of the mA and/or kV according to patient size and/or use of iterative reconstruction technique. CONTRAST:  74m OMNIPAQUE IOHEXOL 350 MG/ML SOLN COMPARISON:  None Available. FINDINGS: Lower chest: No acute abnormality. Dependent bibasilar scarring and or atelectasis. Coronary artery calcifications and or stents. Hepatobiliary: No solid liver abnormality is seen. Gallstones. No gallbladder wall thickening, or biliary dilatation. Pancreas: Unremarkable. No pancreatic ductal dilatation or surrounding inflammatory changes. Spleen: Normal in size without significant abnormality. Adrenals/Urinary Tract: Adrenal glands are unremarkable. Simple, benign right renal cortical cysts, for which no further follow-up or characterization is required. Kidneys are otherwise normal, without renal calculi, solid lesion, or hydronephrosis. Bladder is unremarkable. Stomach/Bowel: Stomach is within normal limits. Wall thickening and fat stranding about the appendix, which is fluid-filled and measures up to 0.9 cm in caliber (series  6, image 67). Vascular/Lymphatic: Aortic atherosclerosis. No enlarged abdominal or pelvic lymph nodes. Reproductive: No mass.  Prostate brachytherapy. Other: No abdominal wall hernia or abnormality. No ascites. Musculoskeletal: No acute or significant osseous findings. IMPRESSION: 1. No CT evidence of acute traumatic injury to the  abdomen or pelvis. 2. Wall thickening and fat stranding about the appendix, which is fluid-filled and measures up to 0.9 cm in caliber. Findings are consistent with acute appendicitis. No evidence of complicating abscess or perforation. 3. Cholelithiasis. 4. Prostate brachytherapy. 5. Coronary artery disease. Aortic Atherosclerosis (ICD10-I70.0). Electronically Signed   By: Delanna Ahmadi M.D.   On: 12/24/2022 18:13   CT Cervical Spine Wo Contrast  Result Date: 12/24/2022 CLINICAL DATA:  Neck trauma EXAM: CT CERVICAL SPINE WITHOUT CONTRAST TECHNIQUE: Multidetector CT imaging of the cervical spine was performed without intravenous contrast. Multiplanar CT image reconstructions were also generated. RADIATION DOSE REDUCTION: This exam was performed according to the departmental dose-optimization program which includes automated exposure control, adjustment of the mA and/or kV according to patient size and/or use of iterative reconstruction technique. COMPARISON:  None Available. FINDINGS: Alignment: Straightening of the normal cervical lordosis. Skull base and vertebrae: No acute fracture. No primary bone lesion or focal pathologic process. Soft tissues and spinal canal: No prevertebral fluid or swelling. No visible canal hematoma. Disc levels: Moderate multilevel disc space height loss and ankylosis throughout. Posterior bridging osteophytosis, most notably at C3 through C5 (series 6, image 30). Upper chest: Negative. Other: None. IMPRESSION: 1. No fracture or static subluxation of the cervical spine. 2. Moderate multilevel disc space height loss and ankylosis throughout. 3. 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 MRI may be used to assess for central cervical canal pathology if desired. Electronically Signed   By: Delanna Ahmadi M.D.   On: 12/24/2022 18:09   CT Head Wo Contrast  Result Date: 12/24/2022 CLINICAL DATA:  Trauma EXAM: CT HEAD WITHOUT CONTRAST TECHNIQUE:  Contiguous axial images were obtained from the base of the skull through the vertex without intravenous contrast. RADIATION DOSE REDUCTION: This exam was performed according to the departmental dose-optimization program which includes automated exposure control, adjustment of the mA and/or kV according to patient size and/or use of iterative reconstruction technique. COMPARISON:  CT head 03/25/2021 FINDINGS: Brain: No evidence of acute infarction, hemorrhage, hydrocephalus, extra-axial collection or mass lesion/mass effect. Again seen is mild diffuse atrophy and mild periventricular white matter hypodensity, likely chronic small vessel ischemic change. Vascular: Atherosclerotic calcifications are present within the cavernous internal carotid arteries. Skull: Normal. Negative for fracture or focal lesion. Sinuses/Orbits: No acute finding. Other: None. IMPRESSION: 1. No acute intracranial abnormality. 2. Mild diffuse atrophy and mild chronic small vessel ischemic change. Electronically Signed   By: Ronney Asters M.D.   On: 12/24/2022 18:04   DG Pelvis 1-2 Views  Result Date: 12/24/2022 CLINICAL DATA:  Pelvic pain after fall. EXAM: PELVIS - 1-2 VIEW COMPARISON:  September 24, 2004. FINDINGS: There is no evidence of pelvic fracture or diastasis. No pelvic bone lesions are seen. Status post prostatic brachytherapy seed placement. IMPRESSION: Negative. Electronically Signed   By: Marijo Conception M.D.   On: 12/24/2022 15:27   DG Chest 2 View  Result Date: 12/24/2022 CLINICAL DATA:  Fall EXAM: CHEST - 2 VIEW COMPARISON:  Chest x-ray 08/03/2022 FINDINGS: Cardiomediastinal silhouette appears stable. Is no focal lung infiltrate, pleural effusion or pneumothorax. No acute fractures are seen. IMPRESSION: No active cardiopulmonary disease. Electronically Signed  By: Ronney Asters M.D.   On: 12/24/2022 15:27    Anti-infectives: Anti-infectives (From admission, onward)    Start     Dose/Rate Route Frequency Ordered Stop    12/25/22 1900  cefTRIAXone (ROCEPHIN) 2 g in sodium chloride 0.9 % 100 mL IVPB       See Hyperspace for full Linked Orders Report.   2 g 200 mL/hr over 30 Minutes Intravenous Every 24 hours 12/24/22 2224     12/25/22 1000  metroNIDAZOLE (FLAGYL) IVPB 500 mg       See Hyperspace for full Linked Orders Report.   500 mg 100 mL/hr over 60 Minutes Intravenous Every 12 hours 12/24/22 2224     12/24/22 1900  cefTRIAXone (ROCEPHIN) 2 g in sodium chloride 0.9 % 100 mL IVPB       See Hyperspace for full Linked Orders Report.   2 g 200 mL/hr over 30 Minutes Intravenous  Once 12/24/22 1855 12/24/22 2042   12/24/22 1900  metroNIDAZOLE (FLAGYL) IVPB 500 mg       See Hyperspace for full Linked Orders Report.   500 mg 100 mL/hr over 60 Minutes Intravenous  Once 12/24/22 1855 12/24/22 2043        Assessment/Plan POD 1, s/p lap appy with drainage of RTP abscess, Dr. Dema Severin, 2/3 for acute perforated appendicitis with RTP abscess -doing well -cont abx therapy -cont JP drain for now given some purulent output, if resolves by time of discharge, will DC at that time -cont soft diet -given circumstances that brought patient into the ED with being down, etc.  Will have PT eval patient to assure he is safe for return home with no needs. -multi-modal pain control   FEN - soft diet VTE - heparin ID - rocephin/flagyl    LOS: 2 days    Henreitta Cea , [facility] Surgery 12/26/2022, 10:24 AM Please see Amion for pager number during day hours 7:00am-4:30pm or 7:00am -11:30am on weekends

## 2022-12-27 DIAGNOSIS — K353 Acute appendicitis with localized peritonitis, without perforation or gangrene: Secondary | ICD-10-CM | POA: Diagnosis not present

## 2022-12-27 LAB — BASIC METABOLIC PANEL
Anion gap: 8 (ref 5–15)
BUN: 17 mg/dL (ref 8–23)
CO2: 24 mmol/L (ref 22–32)
Calcium: 10.1 mg/dL (ref 8.9–10.3)
Chloride: 106 mmol/L (ref 98–111)
Creatinine, Ser: 1.54 mg/dL — ABNORMAL HIGH (ref 0.61–1.24)
GFR, Estimated: 45 mL/min — ABNORMAL LOW (ref >60)
Glucose, Bld: 139 mg/dL — ABNORMAL HIGH (ref 70–99)
Potassium: 3.5 mmol/L (ref 3.5–5.1)
Sodium: 138 mmol/L (ref 135–145)

## 2022-12-27 LAB — GLUCOSE, CAPILLARY
Glucose-Capillary: 144 mg/dL — ABNORMAL HIGH (ref 70–99)
Glucose-Capillary: 175 mg/dL — ABNORMAL HIGH (ref 70–99)
Glucose-Capillary: 163 mg/dL — ABNORMAL HIGH (ref 70–99)
Glucose-Capillary: 121 mg/dL — ABNORMAL HIGH (ref 70–99)
Glucose-Capillary: 215 mg/dL — ABNORMAL HIGH (ref 70–99)
Glucose-Capillary: 146 mg/dL — ABNORMAL HIGH (ref 70–99)

## 2022-12-27 LAB — CBC WITH DIFFERENTIAL/PLATELET
Abs Immature Granulocytes: 0.15 10*3/uL — ABNORMAL HIGH (ref 0.00–0.07)
Basophils Absolute: 0 10*3/uL (ref 0.0–0.1)
Basophils Relative: 0 %
Eosinophils Absolute: 0.4 10*3/uL (ref 0.0–0.5)
Eosinophils Relative: 3 %
HCT: 41.3 % (ref 39.0–52.0)
Hemoglobin: 13.1 g/dL (ref 13.0–17.0)
Immature Granulocytes: 1 %
Lymphocytes Relative: 17 %
Lymphs Abs: 2 10*3/uL (ref 0.7–4.0)
MCH: 25 pg — ABNORMAL LOW (ref 26.0–34.0)
MCHC: 31.7 g/dL (ref 30.0–36.0)
MCV: 79 fL — ABNORMAL LOW (ref 80.0–100.0)
Monocytes Absolute: 1 10*3/uL (ref 0.1–1.0)
Monocytes Relative: 9 %
Neutro Abs: 8 10*3/uL — ABNORMAL HIGH (ref 1.7–7.7)
Neutrophils Relative %: 70 %
Platelets: 223 10*3/uL (ref 150–400)
RBC: 5.23 MIL/uL (ref 4.22–5.81)
RDW: 16.2 % — ABNORMAL HIGH (ref 11.5–15.5)
WBC: 11.5 10*3/uL — ABNORMAL HIGH (ref 4.0–10.5)
nRBC: 0 % (ref 0.0–0.2)

## 2022-12-27 LAB — CK: Total CK: 171 U/L (ref 49–397)

## 2022-12-27 MED ORDER — PROPRANOLOL HCL 10 MG PO TABS: 10.0000 mg | ORAL_TABLET | Freq: Two times a day (BID) | ORAL | Status: DC | PRN

## 2022-12-27 MED ORDER — ZOLPIDEM TARTRATE 5 MG PO TABS: 5.0000 mg | ORAL_TABLET | Freq: Every evening | ORAL | Status: DC | PRN

## 2022-12-27 MED ORDER — POTASSIUM CHLORIDE CRYS ER 20 MEQ PO TBCR
40.0000 meq | EXTENDED_RELEASE_TABLET | Freq: Once | ORAL | Status: AC
  Administered 2022-12-27: 40 meq via ORAL
  Filled 2022-12-27: qty 2

## 2022-12-27 MED ORDER — LABETALOL HCL 5 MG/ML IV SOLN
10.0000 mg | INTRAVENOUS | Status: DC | PRN
  Administered 2022-12-27: 10 mg via INTRAVENOUS
  Filled 2022-12-27: qty 4

## 2022-12-27 MED ORDER — ROSUVASTATIN CALCIUM 20 MG PO TABS
40.0000 mg | ORAL_TABLET | Freq: Every day | ORAL | Status: DC
  Administered 2022-12-27: 40 mg via ORAL
  Filled 2022-12-27: qty 2

## 2022-12-27 MED ORDER — ZOLPIDEM TARTRATE 5 MG PO TABS: 10.0000 mg | ORAL_TABLET | Freq: Every evening | ORAL | Status: DC | PRN

## 2022-12-27 MED ORDER — ASPIRIN 81 MG PO TBEC
81.0000 mg | DELAYED_RELEASE_TABLET | Freq: Every day | ORAL | Status: DC
  Administered 2022-12-27 – 2022-12-28 (×2): 81 mg via ORAL
  Filled 2022-12-27 (×2): qty 1

## 2022-12-27 MED ORDER — VITAMIN B-6 25 MG PO TABS
50.0000 mg | ORAL_TABLET | Freq: Every day | ORAL | Status: DC
  Administered 2022-12-27 – 2022-12-28 (×2): 50 mg via ORAL
  Filled 2022-12-27 (×2): qty 2

## 2022-12-27 MED ORDER — TAMSULOSIN HCL 0.4 MG PO CAPS
0.4000 mg | ORAL_CAPSULE | Freq: Every day | ORAL | Status: DC
  Administered 2022-12-27: 0.4 mg via ORAL
  Filled 2022-12-27: qty 1

## 2022-12-27 MED ORDER — RISPERIDONE 1 MG PO TABS
1.0000 mg | ORAL_TABLET | Freq: Every day | ORAL | Status: DC
  Administered 2022-12-27: 1 mg via ORAL
  Filled 2022-12-27 (×2): qty 1

## 2022-12-27 MED ORDER — ESCITALOPRAM OXALATE 10 MG PO TABS
10.0000 mg | ORAL_TABLET | Freq: Every day | ORAL | Status: DC
  Administered 2022-12-27 – 2022-12-28 (×2): 10 mg via ORAL
  Filled 2022-12-27 (×2): qty 1

## 2022-12-27 NOTE — Progress Notes (Signed)
PROGRESS NOTE    [name] Colon  RCB:[id number] DOB: Oct 14, 1941 DOA: 12/24/2022 PCP: Unk Pinto, MD   Brief Narrative:  HPI: [name] Colon is a 82 y.o. male with medical history significant of  Adenocarcinoma of prostate s/p radiation seeds, CAD s/p stent, essential hypertension, DMII diet controlled,  PTSD/anxiety /depression, who gets care at [facility] presents to ED s/p being found down at home. Per patient unable to give full history of how he fell. However he notes he was too weak get up. He notes for the past 3 days he has been ill with right lower quadrant pain and global weakness unable to get out of bed He also noted low appetite x 1 week but states he was still able to drink and eat. He also note constipation x 1 week as well.  He denies fever/chills/ n/v/dysuria/ sob/ chest pain  or dysuria.     ED Course:  Afeb, BP 176/85, rr16, pul 61  Wbc 9hgb 14.3,  plt 160 ,  NA138, K 2.9, glu 126, cr 1.56 at baseline ( 1.6-1.3)\ CK 1514 Resp panel neg  Cxr: NAD  Pelvis: NAD  EKG: snr /pac WT 500 UA large hgb no rbc CTH NAD Cervical spine . No fracture or static subluxation of the cervical spine. 2. Moderate multilevel disc space height loss and ankylosis throughout. 3. 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 MRI may be used to assess for central cervical canal pathology if desired. Tx potassium 40 meq  nas 500cc  Tx ctx/metroniazole  Assessment & Plan:   Principal Problem:   Acute appendicitis Active Problems:   Essential hypertension   Diet-controlled diabetes mellitus (HCC)   Chronic kidney disease, stage 3a ([place])  Acute perforated appendicitis with retroperitoneal abscess: seen by general surgery, and patient underwent laparoscopic appendectomy on 12/25/2022 by Dr. Dema Severin.  Patient on Rocephin and Flagyl.  He is on soft diet.  He complains of pain and does not feel comfortable going home yet.  Still has the drain.   Management per general surgery.  Hopefully discharge home tomorrow.  Nontraumatic rhabdomyolysis: Resolved.  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  Adenocarcinoma prostate: S/p radiation seeds, no active issues.  Follow-up outpatient.  Essential hypertension: Uncontrolled, blood pressure elevated intermittently.  Continue losartan and IV hydralazine as needed.  Type 2 diabetes mellitus: Appears to be diet controlled, recent hemoglobin A1c 5.8, continue SSI.  Hypokalemia: Resolved but at low normal side so I will replace again to prevent hypokalemia.  DVT prophylaxis: heparin injection 5,000 Units Start: 12/25/22 0600 SCDs Start: 12/24/22 2316   Code Status: Full Code  Family Communication:  None present at bedside.  Plan of care discussed with patient in length and he/she verbalized understanding and agreed with it.  Status is: Inpatient Remains inpatient appropriate because: Advancing diet.  Patient uncomfortable going home yet due to abdominal pain and tenderness.   Estimated body mass index is 27.67 kg/m as calculated from the following:   Height as of this encounter: '5\' 9"'$  (1.753 m).   Weight as of this encounter: 85 kg.    Nutritional Assessment: Body mass index is 27.67 kg/m.Marland Kitchen Seen by dietician.  I agree with the assessment and plan as outlined below: Nutrition Status:        . Skin Assessment: I have examined the patient's skin and I agree with the wound assessment as performed by the wound care RN as outlined below:    Consultants:  General surgery  Procedures:  As above  Antimicrobials:  Anti-infectives (From admission, onward)    Start     Dose/Rate Route Frequency Ordered Stop   12/25/22 1900  cefTRIAXone (ROCEPHIN) 2 g in sodium chloride 0.9 % 100 mL IVPB       See Hyperspace for full Linked Orders Report.   2 g 200 mL/hr over 30 Minutes Intravenous Every 24 hours 12/24/22 2224     12/25/22 1000  metroNIDAZOLE (FLAGYL) IVPB 500 mg       See Hyperspace for  full Linked Orders Report.   500 mg 100 mL/hr over 60 Minutes Intravenous Every 12 hours 12/24/22 2224     12/24/22 1900  cefTRIAXone (ROCEPHIN) 2 g in sodium chloride 0.9 % 100 mL IVPB       See Hyperspace for full Linked Orders Report.   2 g 200 mL/hr over 30 Minutes Intravenous  Once 12/24/22 1855 12/24/22 2042   12/24/22 1900  metroNIDAZOLE (FLAGYL) IVPB 500 mg       See Hyperspace for full Linked Orders Report.   500 mg 100 mL/hr over 60 Minutes Intravenous  Once 12/24/22 1855 12/24/22 2043         Subjective:  Seen and 7.  Complains of abdominal pain.  No other complaint.  Objective: Vitals:   12/27/22 0316 12/27/22 0400 12/27/22 0500 12/27/22 0734  BP: (!) 186/92   (!) 171/89  Pulse:  75  86  Resp: 18   (!) 23  Temp: 98 F (36.7 C)   97.6 F (36.4 C)  TempSrc: Oral   Oral  SpO2:    97%  Weight:   85 kg   Height:        Intake/Output Summary (Last 24 hours) at 12/27/2022 1236 Last data filed at 12/27/2022 1100 Gross per 24 hour  Intake 855.35 ml  Output 1230 ml  Net -374.65 ml    Filed Weights   12/25/22 0404 12/26/22 0400 12/27/22 0500  Weight: 80.9 kg 85.2 kg 85 kg    Examination:  General exam: Appears calm and comfortable  Respiratory system: Clear to auscultation. Respiratory effort normal. Cardiovascular system: S1 & S2 heard, RRR. No JVD, murmurs, rubs, gallops or clicks. No pedal edema. Gastrointestinal system: Abdomen is nondistended, soft and moderate generalized abdominal tenderness. No organomegaly or masses felt. Normal bowel sounds heard. Central nervous system: Alert and oriented. No focal neurological deficits. Extremities: Symmetric 5 x 5 power. Skin: No rashes, lesions or ulcers.  Psychiatry: Judgement and insight appear normal. Mood & affect appropriate.   Data Reviewed: I have personally reviewed following labs and imaging studies  CBC: Recent Labs  Lab 12/24/22 1445 12/25/22 0111 12/26/22 0807 12/27/22 0058  WBC 9.0 8.8 13.3*  11.5*  NEUTROABS 6.7  --  10.2* 8.0*  HGB 14.3 14.3 13.5 13.1  HCT 45.8 45.0 42.2 41.3  MCV 80.2 78.8* 78.1* 79.0*  PLT 160 193 223 604    Basic Metabolic Panel: Recent Labs  Lab 12/24/22 1445 12/24/22 1507 12/25/22 0111 12/26/22 0807 12/27/22 0058  NA 138  --  141 142 138  K 2.9*  --  3.2* 3.5 3.5  CL 103  --  107 110 106  CO2 25  --  '26 24 24  '$ GLUCOSE 126*  --  126* 135* 139*  BUN 10  --  '8 15 17  '$ CREATININE 1.56*  --  1.37* 1.49* 1.54*  CALCIUM 10.1  --  10.1 10.4* 10.1  MG  --  2.3  --  2.3  --     GFR: Estimated Creatinine Clearance: 40 mL/min (A) (by C-G formula based on SCr of 1.54 mg/dL (H)). Liver Function Tests: Recent Labs  Lab 12/24/22 1445 12/25/22 0111 12/26/22 0807  AST 54* 56* 28  ALT 34 40 29  ALKPHOS 70 65 59  BILITOT 1.0 0.9 0.6  PROT 6.6 6.6 6.1*  ALBUMIN 3.2* 3.2* 2.7*    No results for input(s): "LIPASE", "AMYLASE" in the last 168 hours. No results for input(s): "AMMONIA" in the last 168 hours. Coagulation Profile: No results for input(s): "INR", "PROTIME" in the last 168 hours. Cardiac Enzymes: Recent Labs  Lab 12/24/22 1445 12/25/22 0111 12/27/22 0058  CKTOTAL 1,514* 1,418* 171    BNP (last 3 results) No results for input(s): "PROBNP" in the last 8760 hours. HbA1C: Recent Labs    12/25/22 1002  HGBA1C 5.8*    CBG: Recent Labs  Lab 12/26/22 1952 12/26/22 2342 12/27/22 0335 12/27/22 0817 12/27/22 1109  GLUCAP 163* 168* 144* 175* 163*    Lipid Profile: No results for input(s): "CHOL", "HDL", "LDLCALC", "TRIG", "CHOLHDL", "LDLDIRECT" in the last 72 hours. Thyroid Function Tests: No results for input(s): "TSH", "T4TOTAL", "FREET4", "T3FREE", "THYROIDAB" in the last 72 hours. Anemia Panel: No results for input(s): "VITAMINB12", "FOLATE", "FERRITIN", "TIBC", "IRON", "RETICCTPCT" in the last 72 hours. Sepsis Labs: No results for input(s): "PROCALCITON", "LATICACIDVEN" in the last 168 hours.  Recent Results (from the  past 240 hour(s))  Resp panel by RT-PCR (RSV, Flu A&B, Covid) Urine, Clean Catch     Status: None   Collection Time: 12/24/22  2:46 PM   Specimen: Urine, Clean Catch; Nasal Swab  Result Value Ref Range Status   SARS Coronavirus 2 by RT PCR NEGATIVE NEGATIVE Final   Influenza A by PCR NEGATIVE NEGATIVE Final   Influenza B by PCR NEGATIVE NEGATIVE Final    Comment: (NOTE) The Xpert Xpress SARS-CoV-2/FLU/RSV plus assay is intended as an aid in the diagnosis of influenza from Nasopharyngeal swab specimens and should not be used as a sole basis for treatment. Nasal washings and aspirates are unacceptable for Xpert Xpress SARS-CoV-2/FLU/RSV testing.  Fact Sheet for Patients: EntrepreneurPulse.com.au  Fact Sheet for Healthcare Providers: IncredibleEmployment.be  This test is not yet approved or cleared by the [place] FDA and has been authorized for detection and/or diagnosis of SARS-CoV-2 by FDA under an Emergency Use Authorization (EUA). This EUA will remain in effect (meaning this test can be used) for the duration of the COVID-19 declaration under Section 564(b)(1) of the Act, 21 U.S.C. section 360bbb-3(b)(1), unless the authorization is terminated or revoked.     Resp Syncytial Virus by PCR NEGATIVE NEGATIVE Final    Comment: (NOTE) Fact Sheet for Patients: EntrepreneurPulse.com.au  Fact Sheet for Healthcare Providers: IncredibleEmployment.be  This test is not yet approved or cleared by the [place] FDA and has been authorized for detection and/or diagnosis of SARS-CoV-2 by FDA under an Emergency Use Authorization (EUA). This EUA will remain in effect (meaning this test can be used) for the duration of the COVID-19 declaration under Section 564(b)(1) of the Act, 21 U.S.C. section 360bbb-3(b)(1), unless the authorization is terminated or revoked.  Performed at [facility], [place]  [address] [place], [place] [address]      Radiology Studies: No results found.  Scheduled Meds:  acetaminophen  1,000 mg Oral Q6H   aspirin  81 mg Oral Daily   escitalopram  10 mg Oral Daily   heparin  5,000  Units Subcutaneous Q8H   insulin aspart  0-9 Units Subcutaneous Q4H   losartan  100 mg Oral Daily   pyridOXINE  50 mg Oral Daily   risperiDONE  1 mg Oral QHS   rosuvastatin  40 mg Oral QHS   tamsulosin  0.4 mg Oral QHS   Continuous Infusions:  cefTRIAXone (ROCEPHIN)  IV 2 g (12/26/22 1757)   And   metronidazole 500 mg (12/27/22 4818)     LOS: 3 days   Darliss Cheney, MD [facility]  12/27/2022, 12:36 PM   *Please note that this is a verbal dictation therefore any spelling or grammatical errors are due to the "[place] [employer]" system interpretation.  Please page via [place] and do not message via secure chat for urgent patient care matters. Secure chat can be used for non urgent patient care matters.  How to contact the [facility] Attending or Consulting provider [place] or covering provider during after hours [place], for this patient?  Check the care team in [facility] and look for a) attending/consulting [facility] provider listed and b) the [facility] team listed. Page or secure chat [phone]. Log into [email or web address] and use 's universal password to access. If you do not have the password, please contact the hospital operator. Locate the [facility] provider you are looking for under [facility] and page to a number that you can be directly reached. If you still have difficulty reaching the provider, please page the [facility] (Director on Call) for the Hospitalists listed on amion for assistance.

## 2022-12-27 NOTE — Progress Notes (Signed)
Physical Therapy Treatment Patient Details Name: [name] Colon MRN: [id number] DOB: 06-24-1941 Today's Date: 12/27/2022   History of Present Illness 82 y.o. male presents to ED s/p being found down at home. +acute appendicitis; rhabdomyolysis; 12/25/22 laparoscopic appendectomy and drainage of intrabdominal abscess;  PMH significant of   Adenocarcinoma of prostate s/p radiation seeds, CAD s/p stent, essential hypertension, DMII diet controlled,  PTSD/anxiety /depression, who gets care at [facility]    PT Comments    Pt greeted supine in bed and agreeable to session with continued progress towards acute goals. Pt needing increased cues for problem solving throughout session to complete bed mobility, and for safety and environmental awareness. Pt grossly min assist for all mobility with pt demonstrating increased tolerance for gait with RW for >200'. Pt with noted drift to L throughout gait with pt able to self correct in all instances, however pt with x2 instances of running into objects on L and needing cues to redirect RW. Pt continues to benefit from skilled PT services to progress toward functional mobility goals.    Recommendations for follow up therapy are one component of a multi-disciplinary discharge planning process, led by the attending physician.  Recommendations may be updated based on patient status, additional functional criteria and insurance authorization.  Follow Up Recommendations  No PT follow up     Assistance Recommended at Discharge PRN  Patient can return home with the following Assistance with cooking/housework;Help with stairs or ramp for entrance   Equipment Recommendations  None recommended by PT    Recommendations for Other Services       Precautions / Restrictions Precautions Precautions: Fall Restrictions Weight Bearing Restrictions: No     Mobility  Bed Mobility Overal bed mobility: Needs Assistance Bed Mobility: Rolling, Sidelying to Sit Rolling: Min  assist Sidelying to sit: Min assist, HOB elevated       General bed mobility comments: assist to fully roll onto left side; assist to raise torso to full upright sitting; incr time and effort to scoot out to EOB and get both feet on the floor    Transfers Overall transfer level: Needs assistance Equipment used: Rolling walker (2 wheels) Transfers: Sit to/from Stand Sit to Stand: Min guard           General transfer comment: vc for hand placement; no physical assist but guarding for safety as 1st time up post-op    Ambulation/Gait Ambulation/Gait assistance: Min guard Gait Distance (Feet): 225 Feet Assistive device: Rolling walker (2 wheels) Gait Pattern/deviations: Step-through pattern, Decreased stride length, Trunk flexed Gait velocity: decr     General Gait Details: vc for walker proximity and upright posture; pt with noted L drift with pt able to self correct, however pt needing cues to direct RW straight down hall toward target, x2 instanes of getting caught on obstacles on L   Stairs             Wheelchair Mobility    Modified Rankin (Stroke Patients Only)       Balance Overall balance assessment: Needs assistance Sitting-balance support: No upper extremity supported, Feet supported Sitting balance-Leahy Scale: Good     Standing balance support: Bilateral upper extremity supported, During functional activity, Reliant on assistive device for balance Standing balance-Leahy Scale: Poor Standing balance comment: reliance on UE support                            Cognition Arousal/Alertness: Awake/alert Behavior During Therapy:  Flat affect Overall Cognitive Status: Within Functional Limits for tasks assessed                                 General Comments: mild difficulties with problem solving needing cues for hand placement supine>sit and cues to scoot out to EOB        Exercises      General Comments General comments  (skin integrity, edema, etc.): VSS on RA, HR up to 111bpm max during mobility      Pertinent Vitals/Pain Pain Assessment Pain Assessment: No/denies pain    Home Living                          Prior Function            PT Goals (current goals can now be found in the care plan section) Acute Rehab PT Goals Patient Stated Goal: to get home PT Goal Formulation: With patient Time For Goal Achievement: 01/09/23 Progress towards PT goals: Progressing toward goals    Frequency    Min 3X/week      PT Plan      Co-evaluation              AM-PAC PT "6 Clicks" Mobility   Outcome Measure  Help needed turning from your back to your side while in a flat bed without using bedrails?: A Little Help needed moving from lying on your back to sitting on the side of a flat bed without using bedrails?: A Little Help needed moving to and from a bed to a chair (including a wheelchair)?: A Little Help needed standing up from a chair using your arms (e.g., wheelchair or bedside chair)?: A Little Help needed to walk in hospital room?: A Little Help needed climbing 3-5 steps with a railing? : A Little 6 Click Score: 18    End of Session   Activity Tolerance: Patient tolerated treatment well Patient left: in chair;with call bell/phone within reach;with chair alarm set Nurse Communication: Mobility status PT Visit Diagnosis: Other abnormalities of gait and mobility (R26.89);History of falling (Z91.81)     Time: 9935-7017 PT Time Calculation (min) (ACUTE ONLY): 23 min  Charges:  $Gait Training: 8-22 mins $Therapeutic Activity: 8-22 mins                     Danity Schmelzer R. PTA Acute Rehabilitation Services Office: [place] 12/27/2022, 9:36 AM

## 2022-12-27 NOTE — Progress Notes (Signed)
2 Days Post-Op  Subjective: Up in the chair today. C/o pain and says it is not controlled, but has not been given any medication for pain, other than tylenol.  Tolerating a diet.  No flatus or BM, but no nausea.  Voiding well.  ROS: See above, otherwise other systems negative  Objective: Vital signs in last 24 hours: Temp:  [97.6 F (36.4 C)-98.5 F (36.9 C)] 97.6 F (36.4 C) (02/05 0734) Pulse Rate:  [66-86] 86 (02/05 0734) Resp:  [18-23] 23 (02/05 0734) BP: (137-186)/(68-92) 171/89 (02/05 0734) SpO2:  [96 %-97 %] 97 % (02/05 0734) Weight:  [85 kg] 85 kg (02/05 0500) Last BM Date : 12/23/22  Intake/Output from previous day: 02/04 0701 - 02/05 0700 In: 1335.4 [P.O.:1200; IV Piggyback:135.4] Out: 1750 [Urine:1600; Drains:150] Intake/Output this shift: No intake/output data recorded.  PE: Abd: soft, appropriately tender, +BS, incisions c/d/I, JP with serous drainage  Lab Results:  Recent Labs    12/26/22 0807 12/27/22 0058  WBC 13.3* 11.5*  HGB 13.5 13.1  HCT 42.2 41.3  PLT 223 223   BMET Recent Labs    12/26/22 0807 12/27/22 0058  NA 142 138  K 3.5 3.5  CL 110 106  CO2 24 24  GLUCOSE 135* 139*  BUN 15 17  CREATININE 1.49* 1.54*  CALCIUM 10.4* 10.1   PT/INR No results for input(s): "LABPROT", "INR" in the last 72 hours. CMP     Component Value Date/Time   NA 138 12/27/2022 0058   NA 139 04/06/2021 1121   K 3.5 12/27/2022 0058   CL 106 12/27/2022 0058   CO2 24 12/27/2022 0058   GLUCOSE 139 (H) 12/27/2022 0058   BUN 17 12/27/2022 0058   BUN 18 04/06/2021 1121   CREATININE 1.54 (H) 12/27/2022 0058   CREATININE 1.61 (H) 11/25/2022 1126   CALCIUM 10.1 12/27/2022 0058   PROT 6.1 (L) 12/26/2022 0807   PROT 7.2 01/02/2021 0934   ALBUMIN 2.7 (L) 12/26/2022 0807   ALBUMIN 4.7 01/02/2021 0934   AST 28 12/26/2022 0807   ALT 29 12/26/2022 0807   ALKPHOS 59 12/26/2022 0807   BILITOT 0.6 12/26/2022 0807   BILITOT 0.4 01/02/2021 0934   GFRNONAA 45 (L)  12/27/2022 0058   GFRNONAA 42 (L) 05/13/2021 0959   GFRAA 49 (L) 05/13/2021 0959   Lipase  No results found for: "LIPASE"     Studies/Results: No results found.  Anti-infectives: Anti-infectives (From admission, onward)    Start     Dose/Rate Route Frequency Ordered Stop   12/25/22 1900  cefTRIAXone (ROCEPHIN) 2 g in sodium chloride 0.9 % 100 mL IVPB       See Hyperspace for full Linked Orders Report.   2 g 200 mL/hr over 30 Minutes Intravenous Every 24 hours 12/24/22 2224     12/25/22 1000  metroNIDAZOLE (FLAGYL) IVPB 500 mg       See Hyperspace for full Linked Orders Report.   500 mg 100 mL/hr over 60 Minutes Intravenous Every 12 hours 12/24/22 2224     12/24/22 1900  cefTRIAXone (ROCEPHIN) 2 g in sodium chloride 0.9 % 100 mL IVPB       See Hyperspace for full Linked Orders Report.   2 g 200 mL/hr over 30 Minutes Intravenous  Once 12/24/22 1855 12/24/22 2042   12/24/22 1900  metroNIDAZOLE (FLAGYL) IVPB 500 mg       See Hyperspace for full Linked Orders Report.   500 mg 100 mL/hr over 60 Minutes  Intravenous  Once 12/24/22 1855 12/24/22 2043        Assessment/Plan POD 2, s/p lap appy with drainage of RTP abscess, Dr. Dema Severin, 2/3 for acute perforated appendicitis with RTP abscess -doing well -cont abx therapy while present -cont JP drain for now, but will DC prior to DC home -cont soft diet -multi-modal pain control   FEN - soft diet VTE - heparin ID - rocephin/flagyl    LOS: 3 days    Henreitta Cea , [facility] 12/27/2022, 10:43 AM Please see Amion for pager number during day hours 7:00am-4:30pm or 7:00am -11:30am on weekends

## 2022-12-27 NOTE — Discharge Instructions (Signed)
[facility]  Please arrive at least 30 min before your appointment to complete your check in paperwork.  If you are unable to arrive 30 min prior to your appointment time we may have to cancel or reschedule you. LAPAROSCOPIC SURGERY: POST OP INSTRUCTIONS Always review your discharge instruction sheet given to you by the facility where your surgery was performed. IF YOU HAVE DISABILITY OR FAMILY LEAVE FORMS, YOU MUST BRING THEM TO THE OFFICE FOR PROCESSING.   DO NOT GIVE THEM TO YOUR DOCTOR.  PAIN CONTROL  First take acetaminophen (Tylenol) AND/or ibuprofen (Advil) to control your pain after surgery.  Follow directions on package.  Taking acetaminophen (Tylenol) and/or ibuprofen (Advil) regularly after surgery will help to control your pain and lower the amount of prescription pain medication you may need.  You should not take more than 4,000 mg (4 grams) of acetaminophen (Tylenol) in 24 hours.  You should not take ibuprofen (Advil), aleve, motrin, naprosyn or other NSAIDS if you have a history of stomach ulcers or chronic kidney disease.  A prescription for pain medication may be given to you upon discharge.  Take your pain medication as prescribed, if you still have uncontrolled pain after taking acetaminophen (Tylenol) or ibuprofen (Advil). Use ice packs to help control pain. If you need a refill on your pain medication, please contact your pharmacy.  They will contact our office to request authorization. Prescriptions will not be filled after 5pm or on week-ends.  HOME MEDICATIONS Take your usually prescribed medications unless otherwise directed.  DIET You should follow a light diet the first few days after arrival home.  Be sure to include lots of fluids daily. Avoid fatty, fried foods.   CONSTIPATION It is common to experience some constipation after surgery and if you are taking pain medication.  Increasing fluid intake and taking a stool softener (such as Colace)  will usually help or prevent this problem from occurring.  A mild laxative (Milk of Magnesia or Miralax) should be taken according to package instructions if there are no bowel movements after 48 hours.  WOUND/INCISION CARE Most patients will experience some swelling and bruising in the area of the incisions.  Ice packs will help.  Swelling and bruising can take several days to resolve.  Unless discharge instructions indicate otherwise, follow guidelines below  STERI-STRIPS - you may remove your outer bandages 48 hours after surgery, and you may shower at that time.  You have steri-strips (small skin tapes) in place directly over the incision.  These strips should be left on the skin for 7-10 days.   DERMABOND/SKIN GLUE - you may shower in 24 hours.  The glue will flake off over the next 2-3 weeks. Any sutures or staples will be removed at the office during your follow-up visit.  ACTIVITIES You may resume regular (light) daily activities beginning the next day--such as daily self-care, walking, climbing stairs--gradually increasing activities as tolerated.  You may have sexual intercourse when it is comfortable.  Refrain from any heavy lifting or straining until approved by your doctor. You may drive when you are no longer taking prescription pain medication, you can comfortably wear a seatbelt, and you can safely maneuver your car and apply brakes.  FOLLOW-UP You should see your doctor in the office for a follow-up appointment approximately 2-3 weeks after your surgery.  You should have been given your post-op/follow-up appointment when your surgery was scheduled.  If you did not receive a post-op/follow-up appointment, make sure   that you call for this appointment within a day or two after you arrive home to insure a convenient appointment time.   WHEN TO CALL YOUR DOCTOR: Fever over 101.0 Inability to urinate Continued bleeding from incision. Increased pain, redness, or drainage from the  incision. Increasing abdominal pain  The clinic staff is available to answer your questions during regular business hours.  Please don't hesitate to call and ask to speak to one of the nurses for clinical concerns.  If you have a medical emergency, go to the nearest emergency room or call 911.  A surgeon from [facility] is always on call at the hospital. [address], [address], [place]  [address] ? [address] [address], [place], [place]   [address] [phone] ? [phone] ? FAX [phone]  

## 2022-12-27 NOTE — Progress Notes (Signed)
Mobility Specialist Progress Note:   12/27/22 1238  Mobility  Activity Ambulated with assistance in hallway  Level of Assistance Contact guard assist, steadying assist  Assistive Device Front wheel walker  Distance Ambulated (ft) 500 ft  Activity Response Tolerated well  Mobility Referral Yes  $Mobility charge 1 Mobility   Pt received in bed and agreeable. No complaints. Pt returned to bed with all needs met, call bell in reach, and bed alarm on.   Andrey Campanile Mobility Specialist Please contact via SecureChat or  Rehab office at [phone]

## 2022-12-27 NOTE — Care Management Important Message (Signed)
Important Message  Patient Details  Name: [name] Colon MRN: [id number] Date of Birth: 1941-03-20   Medicare Important Message Given:  Yes     Shelda Altes 12/27/2022, 10:21 AM

## 2022-12-28 DIAGNOSIS — K353 Acute appendicitis with localized peritonitis, without perforation or gangrene: Secondary | ICD-10-CM | POA: Diagnosis not present

## 2022-12-28 LAB — CK: Total CK: 85 U/L (ref 49–397)

## 2022-12-28 LAB — SURGICAL PATHOLOGY

## 2022-12-28 LAB — GLUCOSE, CAPILLARY
Glucose-Capillary: 110 mg/dL — ABNORMAL HIGH (ref 70–99)
Glucose-Capillary: 146 mg/dL — ABNORMAL HIGH (ref 70–99)

## 2022-12-28 MED ORDER — METHOCARBAMOL 750 MG PO TABS: 750.0000 mg | ORAL_TABLET | Freq: Four times a day (QID) | ORAL | 1 refills | Status: DC

## 2022-12-28 MED ORDER — ACETAMINOPHEN 500 MG PO TABS: 1000.0000 mg | ORAL_TABLET | Freq: Four times a day (QID) | ORAL | 3 refills | Status: DC

## 2022-12-28 MED ORDER — IBUPROFEN 600 MG PO TABS: 600.0000 mg | ORAL_TABLET | Freq: Four times a day (QID) | ORAL | 1 refills | Status: AC

## 2022-12-28 MED ORDER — AMLODIPINE BESYLATE 5 MG PO TABS: 5.0000 mg | ORAL_TABLET | Freq: Every day | ORAL | 0 refills | Status: DC

## 2022-12-28 MED ORDER — DOCUSATE SODIUM 100 MG PO CAPS: 100.0000 mg | ORAL_CAPSULE | Freq: Two times a day (BID) | ORAL | 2 refills | Status: AC

## 2022-12-28 MED ORDER — TRAMADOL HCL 50 MG PO TABS: 25.0000 mg | ORAL_TABLET | Freq: Four times a day (QID) | ORAL | 0 refills | Status: DC | PRN

## 2022-12-28 NOTE — Discharge Summary (Signed)
Physician Discharge Summary  [name] Colon:[id number] DOB: 15-Jul-1941 DOA: 12/24/2022  PCP: Unk Pinto, MD  Admit date: 12/24/2022 Discharge date: 12/28/2022 30 Day Unplanned Readmission Risk Score    Flowsheet Row ED to Hosp-Admission (Current) from 12/24/2022 in [facility] 4E CV SURGICAL PROGRESSIVE CARE  30 Day Unplanned Readmission Risk Score (%) 15.18 Filed at 12/28/2022 0801       This score is the patient's risk of an unplanned readmission within 30 days of being discharged (0 -100%). The score is based on dignosis, age, lab data, medications, orders, and past utilization.   Low:  0-14.9   Medium: 15-21.9   High: 22-29.9   Extreme: 30 and above          Admitted From: Home Disposition: Home  Recommendations for Outpatient Follow-up:  Follow up with PCP in 1-2 weeks Please obtain BMP/CBC in one week Follow-up with general surgery in 2 weeks Please follow up with your PCP on the following pending results: Unresulted Labs (From admission, onward)    None         Home Health: None Equipment/Devices: None  Discharge Condition: Stable CODE STATUS: Full code Diet recommendation: Cardiac  Subjective: Seen and examined.  He feels better.  Minimal abdominal pain which is expected.  He feels comfortable going home today.  Brief/Interim Summary: [name] Colon is a 82 y.o. male with medical history significant of  Adenocarcinoma of prostate s/p radiation seeds, CAD s/p stent, essential hypertension, DMII diet controlled,  PTSD/anxiety /depression, who gets care at [facility] presented to ED s/p being found down at home.  He endorsed feeling weak for past 3 days and unable to get out of the bed and was unable to eat or drink.  He had some constipation.  No other complaint.  He was diagnosed with acute perforated appendicitis.  Admitted to hospital service details below.   Acute perforated appendicitis with retroperitoneal abscess: seen by general surgery, and patient underwent  laparoscopic appendectomy on 12/25/2022 by Dr. Dema Severin.  Patient was on Rocephin and Flagyl and has tolerated regular diet, he has now been cleared by general surgery for discharge and they have not recommended any antibiotics at discharge.  He is being discharged home in stable condition.   Nontraumatic rhabdomyolysis: Resolved.   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   Adenocarcinoma prostate: S/p radiation seeds, no active issues.  Follow-up outpatient.   Essential hypertension: blood pressure elevated intermittently despite of continuing his home medications which include losartan and labetalol and thus I am also discharging him on low-dose of amlodipine.   Type 2 diabetes mellitus: Appears to be diet controlled, recent hemoglobin A1c 5.8,   Hypokalemia: Resolved   Discharge plan was discussed with patient and/or family member and they verbalized understanding and agreed with it.  Discharge Diagnoses:  Principal Problem:   Acute appendicitis Active Problems:   Essential hypertension   Diet-controlled diabetes mellitus ([place])   Chronic kidney disease, stage 3a ([place])    Discharge Instructions   Allergies as of 12/28/2022   No Known Allergies      Medication List     TAKE these medications    acetaminophen 500 MG tablet Commonly known as: TYLENOL Take 2 tablets (1,000 mg total) by mouth 4 (four) times daily.   aspirin 81 MG tablet Take 81 mg by mouth daily.   cetirizine 10 MG tablet Commonly known as: ZYRTEC Take 10 mg by mouth daily in the afternoon.   diphenhydramine-acetaminophen 25-500 MG Tabs tablet Commonly known  as: TYLENOL PM Take 2 tablets by mouth at bedtime as needed (sleep/pain).   docusate sodium 100 MG capsule Commonly known as: Colace Take 1 capsule (100 mg total) by mouth 2 (two) times daily.   escitalopram 10 MG tablet Commonly known as: LEXAPRO Take 10 mg by mouth daily.   ibuprofen 600 MG tablet Commonly known as: ADVIL Take 1 tablet (600 mg total)  by mouth 4 (four) times daily.   losartan 100 MG tablet Commonly known as: COZAAR Take 100 mg by mouth daily.   Lubricating Plus Eye Drops 0.5 % Soln Generic drug: Carboxymethylcellulose Sod PF Apply 1 drop to eye 2 (two) times daily as needed (dry eyes).   methocarbamol 750 MG tablet Commonly known as: Robaxin-750 Take 1 tablet (750 mg total) by mouth 4 (four) times daily.   nitroGLYCERIN 0.4 MG SL tablet Commonly known as: NITROSTAT Place 0.4 mg under the tongue every 5 (five) minutes as needed for chest pain.   OneTouch Verio test strip Generic drug: glucose blood 1 each by Other route daily in the afternoon. Use as instructed   propranolol 10 MG tablet Commonly known as: INDERAL Take 10 mg by mouth as needed (anxiety).   pyridOXINE 50 MG tablet Commonly known as: VITAMIN B6 Take 50 mg by mouth daily.   risperiDONE 2 MG tablet Commonly known as: RISPERDAL Take 1 mg by mouth at bedtime.   rosuvastatin 40 MG tablet Commonly known as: CRESTOR Take  1 tablet   Daily  for Cholesterol What changed:  how much to take how to take this when to take this additional instructions   tamsulosin 0.4 MG Caps capsule Commonly known as: FLOMAX Take 0.4 mg by mouth at bedtime.   traMADol 50 MG tablet Commonly known as: Ultram Take 0.5-1 tablets (25-50 mg total) by mouth every 6 (six) hours as needed for severe pain.   vitamin C 1000 MG tablet Take 1,000 mg by mouth daily.   zolpidem 10 MG tablet Commonly known as: AMBIEN Take 10 mg by mouth at bedtime as needed for sleep.        Follow-up Information     Maczis, Carlena Hurl, PA-C Follow up on 01/18/2023.   Specialty: General Surgery Why: 8:45 am, Arrive 30 minutes prior to your appointment time, Please bring your insurance card and photo ID Contact information: [place] [address] [phone]         Unk Pinto, MD Follow up in 1 week(s).    Specialty: Internal Medicine Contact information: [address] [place] [address] [phone]                No Known Allergies  Consultations: General surgery   Procedures/Studies: CT Abdomen Pelvis W Contrast  Result Date: 12/24/2022 CLINICAL DATA:  Abdominal trauma EXAM: CT ABDOMEN AND PELVIS WITH CONTRAST TECHNIQUE: Multidetector CT imaging of the abdomen and pelvis was performed using the standard protocol following bolus administration of intravenous contrast. RADIATION DOSE REDUCTION: This exam was performed according to the departmental dose-optimization program which includes automated exposure control, adjustment of the mA and/or kV according to patient size and/or use of iterative reconstruction technique. CONTRAST:  74m OMNIPAQUE IOHEXOL 350 MG/ML SOLN COMPARISON:  None Available. FINDINGS: Lower chest: No acute abnormality. Dependent bibasilar scarring and or atelectasis. Coronary artery calcifications and or stents. Hepatobiliary: No solid liver abnormality is seen. Gallstones. No gallbladder wall thickening, or biliary dilatation. Pancreas: Unremarkable. No pancreatic ductal dilatation or  surrounding inflammatory changes. Spleen: Normal in size without significant abnormality. Adrenals/Urinary Tract: Adrenal glands are unremarkable. Simple, benign right renal cortical cysts, for which no further follow-up or characterization is required. Kidneys are otherwise normal, without renal calculi, solid lesion, or hydronephrosis. Bladder is unremarkable. Stomach/Bowel: Stomach is within normal limits. Wall thickening and fat stranding about the appendix, which is fluid-filled and measures up to 0.9 cm in caliber (series 6, image 67). Vascular/Lymphatic: Aortic atherosclerosis. No enlarged abdominal or pelvic lymph nodes. Reproductive: No mass.  Prostate brachytherapy. Other: No abdominal wall hernia or abnormality. No ascites. Musculoskeletal: No acute or  significant osseous findings. IMPRESSION: 1. No CT evidence of acute traumatic injury to the abdomen or pelvis. 2. Wall thickening and fat stranding about the appendix, which is fluid-filled and measures up to 0.9 cm in caliber. Findings are consistent with acute appendicitis. No evidence of complicating abscess or perforation. 3. Cholelithiasis. 4. Prostate brachytherapy. 5. Coronary artery disease. Aortic Atherosclerosis (ICD10-I70.0). Electronically Signed   By: Delanna Ahmadi M.D.   On: 12/24/2022 18:13   CT Cervical Spine Wo Contrast  Result Date: 12/24/2022 CLINICAL DATA:  Neck trauma EXAM: CT CERVICAL SPINE WITHOUT CONTRAST TECHNIQUE: Multidetector CT imaging of the cervical spine was performed without intravenous contrast. Multiplanar CT image reconstructions were also generated. RADIATION DOSE REDUCTION: This exam was performed according to the departmental dose-optimization program which includes automated exposure control, adjustment of the mA and/or kV according to patient size and/or use of iterative reconstruction technique. COMPARISON:  None Available. FINDINGS: Alignment: Straightening of the normal cervical lordosis. Skull base and vertebrae: No acute fracture. No primary bone lesion or focal pathologic process. Soft tissues and spinal canal: No prevertebral fluid or swelling. No visible canal hematoma. Disc levels: Moderate multilevel disc space height loss and ankylosis throughout. Posterior bridging osteophytosis, most notably at C3 through C5 (series 6, image 30). Upper chest: Negative. Other: None. IMPRESSION: 1. No fracture or static subluxation of the cervical spine. 2. Moderate multilevel disc space height loss and ankylosis throughout. 3. 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 MRI may be used to assess for central cervical canal pathology if desired. Electronically Signed   By: Delanna Ahmadi M.D.   On: 12/24/2022 18:09   CT Head Wo  Contrast  Result Date: 12/24/2022 CLINICAL DATA:  Trauma EXAM: CT HEAD WITHOUT CONTRAST TECHNIQUE: Contiguous axial images were obtained from the base of the skull through the vertex without intravenous contrast. RADIATION DOSE REDUCTION: This exam was performed according to the departmental dose-optimization program which includes automated exposure control, adjustment of the mA and/or kV according to patient size and/or use of iterative reconstruction technique. COMPARISON:  CT head 03/25/2021 FINDINGS: Brain: No evidence of acute infarction, hemorrhage, hydrocephalus, extra-axial collection or mass lesion/mass effect. Again seen is mild diffuse atrophy and mild periventricular white matter hypodensity, likely chronic small vessel ischemic change. Vascular: Atherosclerotic calcifications are present within the cavernous internal carotid arteries. Skull: Normal. Negative for fracture or focal lesion. Sinuses/Orbits: No acute finding. Other: None. IMPRESSION: 1. No acute intracranial abnormality. 2. Mild diffuse atrophy and mild chronic small vessel ischemic change. Electronically Signed   By: Ronney Asters M.D.   On: 12/24/2022 18:04   DG Pelvis 1-2 Views  Result Date: 12/24/2022 CLINICAL DATA:  Pelvic pain after fall. EXAM: PELVIS - 1-2 VIEW COMPARISON:  September 24, 2004. FINDINGS: There is no evidence of pelvic fracture or diastasis. No pelvic bone lesions are seen. Status post prostatic  brachytherapy seed placement. IMPRESSION: Negative. Electronically Signed   By: Marijo Conception M.D.   On: 12/24/2022 15:27   DG Chest 2 View  Result Date: 12/24/2022 CLINICAL DATA:  Fall EXAM: CHEST - 2 VIEW COMPARISON:  Chest x-ray 08/03/2022 FINDINGS: Cardiomediastinal silhouette appears stable. Is no focal lung infiltrate, pleural effusion or pneumothorax. No acute fractures are seen. IMPRESSION: No active cardiopulmonary disease. Electronically Signed   By: Ronney Asters M.D.   On: 12/24/2022 15:27     Discharge  Exam: Vitals:   12/28/22 0956 12/28/22 1021  BP: (!) 180/95 (!) 172/73  Pulse:    Resp:    Temp:    SpO2:     Vitals:   12/28/22 0602 12/28/22 0813 12/28/22 0956 12/28/22 1021  BP: (!) 150/79 (!) 164/84 (!) 180/95 (!) 172/73  Pulse:      Resp: 19 16    Temp:  98.4 F (36.9 C)    TempSrc:  Oral    SpO2: 98%     Weight:      Height:        General: Pt is alert, awake, not in acute distress Cardiovascular: RRR, S1/S2 +, no rubs, no gallops Respiratory: CTA bilaterally, no wheezing, no rhonchi Abdominal: Soft, NT, mild generalized abdominal tenderness, bowel sounds + Extremities: no edema, no cyanosis    The results of significant diagnostics from this hospitalization (including imaging, microbiology, ancillary and laboratory) are listed below for reference.     Microbiology: Recent Results (from the past 240 hour(s))  Resp panel by RT-PCR (RSV, Flu A&B, Covid) Urine, Clean Catch     Status: None   Collection Time: 12/24/22  2:46 PM   Specimen: Urine, Clean Catch; Nasal Swab  Result Value Ref Range Status   SARS Coronavirus 2 by RT PCR NEGATIVE NEGATIVE Final   Influenza A by PCR NEGATIVE NEGATIVE Final   Influenza B by PCR NEGATIVE NEGATIVE Final    Comment: (NOTE) The Xpert Xpress SARS-CoV-2/FLU/RSV plus assay is intended as an aid in the diagnosis of influenza from Nasopharyngeal swab specimens and should not be used as a sole basis for treatment. Nasal washings and aspirates are unacceptable for Xpert Xpress SARS-CoV-2/FLU/RSV testing.  Fact Sheet for Patients: EntrepreneurPulse.com.au  Fact Sheet for Healthcare Providers: IncredibleEmployment.be  This test is not yet approved or cleared by the [place] FDA and has been authorized for detection and/or diagnosis of SARS-CoV-2 by FDA under an Emergency Use Authorization (EUA). This EUA will remain in effect (meaning this test can be used) for the duration of the COVID-19  declaration under Section 564(b)(1) of the Act, 21 U.S.C. section 360bbb-3(b)(1), unless the authorization is terminated or revoked.     Resp Syncytial Virus by PCR NEGATIVE NEGATIVE Final    Comment: (NOTE) Fact Sheet for Patients: EntrepreneurPulse.com.au  Fact Sheet for Healthcare Providers: IncredibleEmployment.be  This test is not yet approved or cleared by the [place] FDA and has been authorized for detection and/or diagnosis of SARS-CoV-2 by FDA under an Emergency Use Authorization (EUA). This EUA will remain in effect (meaning this test can be used) for the duration of the COVID-19 declaration under Section 564(b)(1) of the Act, 21 U.S.C. section 360bbb-3(b)(1), unless the authorization is terminated or revoked.  Performed at [facility], [place] [address], [place], [place] [address]      Labs: BNP (last 3 results) No results for input(s): "BNP" in the last 8760 hours. Basic Metabolic Panel: Recent Labs  Lab 12/24/22 1445  12/24/22 1507 12/25/22 0111 12/26/22 0807 12/27/22 0058  NA 138  --  141 142 138  K 2.9*  --  3.2* 3.5 3.5  CL 103  --  107 110 106  CO2 25  --  '26 24 24  '$ GLUCOSE 126*  --  126* 135* 139*  BUN 10  --  '8 15 17  '$ CREATININE 1.56*  --  1.37* 1.49* 1.54*  CALCIUM 10.1  --  10.1 10.4* 10.1  MG  --  2.3  --  2.3  --    Liver Function Tests: Recent Labs  Lab 12/24/22 1445 12/25/22 0111 12/26/22 0807  AST 54* 56* 28  ALT 34 40 29  ALKPHOS 70 65 59  BILITOT 1.0 0.9 0.6  PROT 6.6 6.6 6.1*  ALBUMIN 3.2* 3.2* 2.7*   No results for input(s): "LIPASE", "AMYLASE" in the last 168 hours. No results for input(s): "AMMONIA" in the last 168 hours. CBC: Recent Labs  Lab 12/24/22 1445 12/25/22 0111 12/26/22 0807 12/27/22 0058  WBC 9.0 8.8 13.3* 11.5*  NEUTROABS 6.7  --  10.2* 8.0*  HGB 14.3 14.3 13.5 13.1  HCT 45.8 45.0 42.2 41.3  MCV 80.2 78.8* 78.1* 79.0*  PLT 160 193 223 223   Cardiac  Enzymes: Recent Labs  Lab 12/24/22 1445 12/25/22 0111 12/27/22 0058 12/28/22 0131  CKTOTAL 1,514* 1,418* 171 85   BNP: Invalid input(s): "POCBNP" CBG: Recent Labs  Lab 12/27/22 1648 12/27/22 1948 12/27/22 2309 12/28/22 0332 12/28/22 0814  GLUCAP 121* 215* 146* 110* 146*   D-Dimer No results for input(s): "DDIMER" in the last 72 hours. Hgb A1c No results for input(s): "HGBA1C" in the last 72 hours. Lipid Profile No results for input(s): "CHOL", "HDL", "LDLCALC", "TRIG", "CHOLHDL", "LDLDIRECT" in the last 72 hours. Thyroid function studies No results for input(s): "TSH", "T4TOTAL", "T3FREE", "THYROIDAB" in the last 72 hours.  Invalid input(s): "FREET3" Anemia work up No results for input(s): "VITAMINB12", "FOLATE", "FERRITIN", "TIBC", "IRON", "RETICCTPCT" in the last 72 hours. Urinalysis    Component Value Date/Time   COLORURINE YELLOW 12/24/2022 1538   APPEARANCEUR HAZY (A) 12/24/2022 1538   LABSPEC 1.011 12/24/2022 1538   PHURINE 6.0 12/24/2022 1538   GLUCOSEU NEGATIVE 12/24/2022 1538   HGBUR LARGE (A) 12/24/2022 1538   BILIRUBINUR NEGATIVE 12/24/2022 1538   KETONESUR NEGATIVE 12/24/2022 1538   PROTEINUR 100 (A) 12/24/2022 1538   NITRITE NEGATIVE 12/24/2022 1538   LEUKOCYTESUR NEGATIVE 12/24/2022 1538   Sepsis Labs Recent Labs  Lab 12/24/22 1445 12/25/22 0111 12/26/22 0807 12/27/22 0058  WBC 9.0 8.8 13.3* 11.5*   Microbiology Recent Results (from the past 240 hour(s))  Resp panel by RT-PCR (RSV, Flu A&B, Covid) Urine, Clean Catch     Status: None   Collection Time: 12/24/22  2:46 PM   Specimen: Urine, Clean Catch; Nasal Swab  Result Value Ref Range Status   SARS Coronavirus 2 by RT PCR NEGATIVE NEGATIVE Final   Influenza A by PCR NEGATIVE NEGATIVE Final   Influenza B by PCR NEGATIVE NEGATIVE Final    Comment: (NOTE) The Xpert Xpress SARS-CoV-2/FLU/RSV plus assay is intended as an aid in the diagnosis of influenza from Nasopharyngeal swab specimens  and should not be used as a sole basis for treatment. Nasal washings and aspirates are unacceptable for Xpert Xpress SARS-CoV-2/FLU/RSV testing.  Fact Sheet for Patients: EntrepreneurPulse.com.au  Fact Sheet for Healthcare Providers: IncredibleEmployment.be  This test is not yet approved or cleared by the [place] and has been authorized for  detection and/or diagnosis of SARS-CoV-2 by FDA under an Emergency Use Authorization (EUA). This EUA will remain in effect (meaning this test can be used) for the duration of the COVID-19 declaration under Section 564(b)(1) of the Act, 21 U.S.C. section 360bbb-3(b)(1), unless the authorization is terminated or revoked.     Resp Syncytial Virus by PCR NEGATIVE NEGATIVE Final    Comment: (NOTE) Fact Sheet for Patients: EntrepreneurPulse.com.au  Fact Sheet for Healthcare Providers: IncredibleEmployment.be  This test is not yet approved or cleared by the [place] FDA and has been authorized for detection and/or diagnosis of SARS-CoV-2 by FDA under an Emergency Use Authorization (EUA). This EUA will remain in effect (meaning this test can be used) for the duration of the COVID-19 declaration under Section 564(b)(1) of the Act, 21 U.S.C. section 360bbb-3(b)(1), unless the authorization is terminated or revoked.  Performed at [facility], [place] [address] [place], [place] [address]      Time coordinating discharge: Over 30 minutes  SIGNED:   Darliss Cheney, MD  [facility] 12/28/2022, 11:23 AM *Please note that this is a verbal dictation therefore any spelling or grammatical errors are due to the "[place]" system interpretation. If 7PM-7AM, please contact night-coverage [email or web address]

## 2022-12-28 NOTE — Progress Notes (Signed)
Mobility Specialist - Progress Note   12/28/22 1110  Mobility  Activity Ambulated with assistance in hallway  Level of Assistance Moderate assist, patient does 50-74%  Assistive Device Front wheel walker  Distance Ambulated (ft) 300 ft  Activity Response Tolerated well  Mobility Referral Yes  $Mobility charge 1 Mobility    Pt received in bed agreeable to mobility. ModA to stand from EOB, MinG throughout ambulation. Left in recliner w/ chair alarm on and call bell in his lap.   [place] Specialist Please contact via SecureChat or Rehab office at [phone]

## 2022-12-28 NOTE — Progress Notes (Signed)
JP drain removed.

## 2022-12-28 NOTE — Progress Notes (Signed)
General Surgery Follow Up Note  Subjective:    Overnight Issues:   Objective:  Vital signs for last 24 hours: Temp:  [98.1 F (36.7 C)-98.5 F (36.9 C)] 98.4 F (36.9 C) (02/06 0813) Pulse Rate:  [64-89] 66 (02/06 0328) Resp:  [16-24] 16 (02/06 0813) BP: (142-177)/(70-89) 164/84 (02/06 0813) SpO2:  [97 %-100 %] 98 % (02/06 0602) Weight:  [84.4 kg] 84.4 kg (02/06 0339)  Hemodynamic parameters for last 24 hours:    Intake/Output from previous day: 02/05 0701 - 02/06 0700 In: 150 [P.O.:150] Out: 2560 [Urine:2300; Drains:260]  Intake/Output this shift: Total I/O In: 120 [P.O.:120] Out: 350 [Urine:350]  Vent settings for last 24 hours:    Physical Exam:  Gen: comfortable, no distress Neuro: non-focal exam HEENT: PERRL Neck: supple CV: RRR Pulm: unlabored breathing Abd: soft, NT, JP serous GU: clear yellow urine Extr: wwp, no edema   Results for orders placed or performed during the hospital encounter of 12/24/22 (from the past 24 hour(s))  Glucose, capillary     Status: Abnormal   Collection Time: 12/27/22 11:09 AM  Result Value Ref Range   Glucose-Capillary 163 (H) 70 - 99 mg/dL  Glucose, capillary     Status: Abnormal   Collection Time: 12/27/22  4:48 PM  Result Value Ref Range   Glucose-Capillary 121 (H) 70 - 99 mg/dL  Glucose, capillary     Status: Abnormal   Collection Time: 12/27/22  7:48 PM  Result Value Ref Range   Glucose-Capillary 215 (H) 70 - 99 mg/dL   Comment 1 Notify RN    Comment 2 Document in Chart   Glucose, capillary     Status: Abnormal   Collection Time: 12/27/22 11:09 PM  Result Value Ref Range   Glucose-Capillary 146 (H) 70 - 99 mg/dL   Comment 1 Notify RN    Comment 2 Document in Chart   CK     Status: None   Collection Time: 12/28/22  1:31 AM  Result Value Ref Range   Total CK 85 49 - 397 U/L  Glucose, capillary     Status: Abnormal   Collection Time: 12/28/22  3:32 AM  Result Value Ref Range   Glucose-Capillary 110 (H)  70 - 99 mg/dL   Comment 1 Notify RN    Comment 2 Document in Chart   Glucose, capillary     Status: Abnormal   Collection Time: 12/28/22  8:14 AM  Result Value Ref Range   Glucose-Capillary 146 (H) 70 - 99 mg/dL    Assessment & Plan:  Present on Admission:  Acute appendicitis  Essential hypertension  Chronic kidney disease, stage 3a ([place])    LOS: 4 days   Additional comments:I reviewed the patient's new clinical lab test results.   and I reviewed the patients new imaging test results.    POD 3, s/p lap appy with drainage of RTP abscess, Dr. Dema Severin, 2/3 for acute perforated appendicitis with RTP abscess -doing well -cont abx therapy while present -d/c JP drain for now -cont soft diet -multi-modal pain control  FEN - soft diet VTE - heparin ID - rocephin/flagyl  Medically cleared for discharge home today from surgery perspective. No indication for abx at discharge from surgery perspective.   Jesusita Oka, MD Trauma & General Surgery Please use [email or web address] to contact on call provider  12/28/2022  *Care during the described time interval was provided by me. I have reviewed this patient's available data, including medical history, events of note, physical  examination and test results as part of my evaluation.

## 2022-12-28 NOTE — Plan of Care (Signed)
  Problem: Education: Goal: Ability to describe self-care measures that may prevent or decrease complications (Diabetes Survival Skills Education) will improve Outcome: Adequate for Discharge Goal: Individualized Educational Video(s) Outcome: Adequate for Discharge   Problem: Coping: Goal: Ability to adjust to condition or change in health will improve Outcome: Adequate for Discharge   Problem: Fluid Volume: Goal: Ability to maintain a balanced intake and output will improve Outcome: Adequate for Discharge   Problem: Health Behavior/Discharge Planning: Goal: Ability to identify and utilize available resources and services will improve Outcome: Adequate for Discharge Goal: Ability to manage health-related needs will improve Outcome: Adequate for Discharge   Problem: Skin Integrity: Goal: Risk for impaired skin integrity will decrease Outcome: Adequate for Discharge   Problem: Pain Managment: Goal: General experience of comfort will improve Outcome: Adequate for Discharge   Problem: Safety: Goal: Ability to remain free from injury will improve Outcome: Adequate for Discharge

## 2022-12-29 ENCOUNTER — Ambulatory Visit: Payer: Medicare Other | Admitting: Nurse Practitioner

## 2023-01-06 ENCOUNTER — Ambulatory Visit (INDEPENDENT_AMBULATORY_CARE_PROVIDER_SITE_OTHER): Payer: Medicare Other | Admitting: Nurse Practitioner

## 2023-01-06 ENCOUNTER — Encounter: Payer: Self-pay | Admitting: Nurse Practitioner

## 2023-01-06 VITALS — BP 200/100 | HR 50 | Temp 98.1°F | Ht 69.0 in | Wt 181.6 lb

## 2023-01-06 DIAGNOSIS — M6282 Rhabdomyolysis: Secondary | ICD-10-CM | POA: Diagnosis not present

## 2023-01-06 DIAGNOSIS — N183 Chronic kidney disease, stage 3 unspecified: Secondary | ICD-10-CM

## 2023-01-06 DIAGNOSIS — I10 Essential (primary) hypertension: Secondary | ICD-10-CM

## 2023-01-06 DIAGNOSIS — E119 Type 2 diabetes mellitus without complications: Secondary | ICD-10-CM

## 2023-01-06 DIAGNOSIS — K353 Acute appendicitis with localized peritonitis, without perforation or gangrene: Secondary | ICD-10-CM

## 2023-01-06 DIAGNOSIS — Z09 Encounter for follow-up examination after completed treatment for conditions other than malignant neoplasm: Secondary | ICD-10-CM

## 2023-01-06 DIAGNOSIS — Z79899 Other long term (current) drug therapy: Secondary | ICD-10-CM

## 2023-01-06 DIAGNOSIS — E1122 Type 2 diabetes mellitus with diabetic chronic kidney disease: Secondary | ICD-10-CM

## 2023-01-06 DIAGNOSIS — M7661 Achilles tendinitis, right leg: Secondary | ICD-10-CM

## 2023-01-06 DIAGNOSIS — R52 Pain, unspecified: Secondary | ICD-10-CM

## 2023-01-06 NOTE — Progress Notes (Signed)
Hospital follow up  Assessment and Plan: Hospital visit follow up for:   Hospital discharge follow-up Reviewed discharge instructions in full including medication changes, diagnostics, labs, and future follow ups appointment. All questions and concerns addressed.    Acute appendicitis with localized peritonitis, without perforation, abscess, or gangrene Appendectomy 12/25/22 Site well healed well approximated Continue to follow with General Surgery, Dr. Dema Severin as directed.   Non-traumatic rhabdomyolysis Resolved  Essential hypertension Uncontrolled - continue amlodipine, asa, losartan.  Take when you get home and office will contact you at lunch to assess BP.  Discussed DASH (Dietary Approaches to Stop Hypertension) DASH diet is lower in sodium than a typical American diet. Cut back on foods that are high in saturated fat, cholesterol, and trans fats. Eat more whole-grain foods, fish, poultry, and nuts Remain active and exercise as tolerated daily.  Monitor BP at home-Call if greater than 130/80.  Check CMP/CBC Report to ER for any increase in stroke like symptoms, including HA, N/V, paralysis, difficulty speaking, trouble walking, confusion, vision changes, CP, heart palpitations, SOB, diaphoresis.  CKD stage 3 due to type 2 diabetes mellitus (HCC) Continue ACE, bASA, statin Discussed how what you eat and drink can aide in kidney protection. Stay well hydrated. Avoid high salt foods. Avoid NSAIDS. Keep BP and BG well controlled.   Take medications as prescribed. Remain active and exercise as tolerated daily. Maintain weight.  Continue to monitor. Check CMP/GFR/Microablumin   Diet-controlled diabetes mellitus ([place]) Continue diet controlled.  Education: Reviewed 'ABCs' of diabetes management  Discussed goals to be met and/or maintained include A1C (<7) Blood pressure (<130/80) Cholesterol (LDL <70) Continue Eye Exam yearly  Continue Dental Exam Q6 mo Discussed dietary  recommendations Discussed Physical Activity recommendations Check A1C  Medication management All medications discussed and reviewed in full. All questions and concerns regarding medications addressed.    Right achilles tendonitis Review of inpatient MAR shows administration of cephalosporin, not quinolone, however will obtain imaging for further review of any tendon rupture.   Right tendon Ultrasound  Orders Placed This Encounter  Procedures   [place] LIMITED JOINT SPACE STRUCTURES LOW RIGHT    Standing Status:   Future    Standing Expiration Date:   01/07/2024    Order Specific Question:   Reason for Exam (SYMPTOM  OR DIAGNOSIS REQUIRED)    Answer:   x3 days right achilles tendon pain, tender to palpation, pain with walking.  Note:  recent d/t from hospital surgery/abx administration, assess for rupture    Order Specific Question:   Preferred imaging location?    Answer:   [id number]   CBC with Differential/Platelet   COMPLETE METABOLIC PANEL WITH GFR   Hemoglobin A1c   All medications were reviewed with patient and fully reconciled. All questions answered fully, and patient and family members were encouraged to call the office with any further questions or concerns. Discussed goal to avoid readmission related to this diagnosis.   Over 40 minutes of exam, counseling, chart review, and complex, high/moderate level critical decision making was performed this visit.   Future Appointments  Date Time Provider [place]  01/06/2023  9:00 AM Darrol Jump, NP [facility] None  03/02/2023 11:00 AM Darrol Jump, NP [facility] None  06/10/2023 10:00 AM Unk Pinto, MD [facility] None     HPI 82 y.o.male presents for follow up for transition from recent hospitalization or [facility] stay. Admit date to the hospital was 12/24/22, patient was discharged from the hospital on 12/28/22 and our clinical  staff contacted the office the day after discharge to set up a follow up appointment.  The discharge summary, medications, and diagnostic test results were reviewed before meeting with the patient. The patient was admitted for acute appendicitis with appendectomy.[name] Colon is a 82 y.o. male with medical history significant of  Adenocarcinoma of prostate s/p radiation seeds, CAD s/p stent, essential hypertension, DMII diet controlled,  PTSD/anxiety /depression, who gets care at [facility] presented to ED s/p being found down at home.  He endorsed feeling weak for past 3 days prior to admission to ER and unable to get out of the bed and was not to eat or drink.  He had some constipation.  No other complaint.  He was diagnosed with acute perforated appendicitis.    He had an acute perforated appendicitis with retroperitoneal abscess: seen by general surgery, and patient underwent laparoscopic appendectomy on 12/25/2022 by Dr. Dema Severin.  Patient was on Rocephin and Flagyl and was able to tolerate a regular diet p/t d/c.  General surgery did not recommended any antibiotics at discharge.   He was being discharged home in stable condition.   He was also noted to have nontraumatic rhabdomyolysis that resolved.   CKD stage IIIa was at baseline upon discharge.  Home health is not involved.   He reports RLE achilles tendon pain that started x3 days ago.  Upon review of MAR it appears that patient was not prescribed quinolone.  He was administered cephalosporin, Rocephin.  He endorses difficulty walking, pain concentrated in the achilles tendon area.  Using straight cane.  Endorses tenderness to palpation.  Denies   BP is elevated in clinic today.  States that he has not taken his anti-hypertensive medications this morning.  Denies CP, SOB, heart palpitations, N/V, diaphoresis.   Images while in the hospital: CT Abdomen Pelvis W Contrast  Result Date: 12/24/2022 CLINICAL DATA:  Abdominal trauma EXAM: CT ABDOMEN AND PELVIS WITH CONTRAST TECHNIQUE: Multidetector CT imaging of the abdomen and  pelvis was performed using the standard protocol following bolus administration of intravenous contrast. RADIATION DOSE REDUCTION: This exam was performed according to the departmental dose-optimization program which includes automated exposure control, adjustment of the mA and/or kV according to patient size and/or use of iterative reconstruction technique. CONTRAST:  67m OMNIPAQUE IOHEXOL 350 MG/ML SOLN COMPARISON:  None Available. FINDINGS: Lower chest: No acute abnormality. Dependent bibasilar scarring and or atelectasis. Coronary artery calcifications and or stents. Hepatobiliary: No solid liver abnormality is seen. Gallstones. No gallbladder wall thickening, or biliary dilatation. Pancreas: Unremarkable. No pancreatic ductal dilatation or surrounding inflammatory changes. Spleen: Normal in size without significant abnormality. Adrenals/Urinary Tract: Adrenal glands are unremarkable. Simple, benign right renal cortical cysts, for which no further follow-up or characterization is required. Kidneys are otherwise normal, without renal calculi, solid lesion, or hydronephrosis. Bladder is unremarkable. Stomach/Bowel: Stomach is within normal limits. Wall thickening and fat stranding about the appendix, which is fluid-filled and measures up to 0.9 cm in caliber (series 6, image 67). Vascular/Lymphatic: Aortic atherosclerosis. No enlarged abdominal or pelvic lymph nodes. Reproductive: No mass.  Prostate brachytherapy. Other: No abdominal wall hernia or abnormality. No ascites. Musculoskeletal: No acute or significant osseous findings. IMPRESSION: 1. No CT evidence of acute traumatic injury to the abdomen or pelvis. 2. Wall thickening and fat stranding about the appendix, which is fluid-filled and measures up to 0.9 cm in caliber. Findings are consistent with acute appendicitis. No evidence of complicating abscess or perforation. 3. Cholelithiasis. 4. Prostate brachytherapy.  5. Coronary artery disease. Aortic  Atherosclerosis (ICD10-I70.0). Electronically Signed   By: Delanna Ahmadi M.D.   On: 12/24/2022 18:13   CT Cervical Spine Wo Contrast  Result Date: 12/24/2022 CLINICAL DATA:  Neck trauma EXAM: CT CERVICAL SPINE WITHOUT CONTRAST TECHNIQUE: Multidetector CT imaging of the cervical spine was performed without intravenous contrast. Multiplanar CT image reconstructions were also generated. RADIATION DOSE REDUCTION: This exam was performed according to the departmental dose-optimization program which includes automated exposure control, adjustment of the mA and/or kV according to patient size and/or use of iterative reconstruction technique. COMPARISON:  None Available. FINDINGS: Alignment: Straightening of the normal cervical lordosis. Skull base and vertebrae: No acute fracture. No primary bone lesion or focal pathologic process. Soft tissues and spinal canal: No prevertebral fluid or swelling. No visible canal hematoma. Disc levels: Moderate multilevel disc space height loss and ankylosis throughout. Posterior bridging osteophytosis, most notably at C3 through C5 (series 6, image 30). Upper chest: Negative. Other: None. IMPRESSION: 1. No fracture or static subluxation of the cervical spine. 2. Moderate multilevel disc space height loss and ankylosis throughout. 3. 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 MRI may be used to assess for central cervical canal pathology if desired. Electronically Signed   By: Delanna Ahmadi M.D.   On: 12/24/2022 18:09   CT Head Wo Contrast  Result Date: 12/24/2022 CLINICAL DATA:  Trauma EXAM: CT HEAD WITHOUT CONTRAST TECHNIQUE: Contiguous axial images were obtained from the base of the skull through the vertex without intravenous contrast. RADIATION DOSE REDUCTION: This exam was performed according to the departmental dose-optimization program which includes automated exposure control, adjustment of the mA and/or kV according to patient  size and/or use of iterative reconstruction technique. COMPARISON:  CT head 03/25/2021 FINDINGS: Brain: No evidence of acute infarction, hemorrhage, hydrocephalus, extra-axial collection or mass lesion/mass effect. Again seen is mild diffuse atrophy and mild periventricular white matter hypodensity, likely chronic small vessel ischemic change. Vascular: Atherosclerotic calcifications are present within the cavernous internal carotid arteries. Skull: Normal. Negative for fracture or focal lesion. Sinuses/Orbits: No acute finding. Other: None. IMPRESSION: 1. No acute intracranial abnormality. 2. Mild diffuse atrophy and mild chronic small vessel ischemic change. Electronically Signed   By: Ronney Asters M.D.   On: 12/24/2022 18:04   DG Pelvis 1-2 Views  Result Date: 12/24/2022 CLINICAL DATA:  Pelvic pain after fall. EXAM: PELVIS - 1-2 VIEW COMPARISON:  September 24, 2004. FINDINGS: There is no evidence of pelvic fracture or diastasis. No pelvic bone lesions are seen. Status post prostatic brachytherapy seed placement. IMPRESSION: Negative. Electronically Signed   By: Marijo Conception M.D.   On: 12/24/2022 15:27   DG Chest 2 View  Result Date: 12/24/2022 CLINICAL DATA:  Fall EXAM: CHEST - 2 VIEW COMPARISON:  Chest x-ray 08/03/2022 FINDINGS: Cardiomediastinal silhouette appears stable. Is no focal lung infiltrate, pleural effusion or pneumothorax. No acute fractures are seen. IMPRESSION: No active cardiopulmonary disease. Electronically Signed   By: Ronney Asters M.D.   On: 12/24/2022 15:27      Current Outpatient Medications (Cardiovascular):    amLODipine (NORVASC) 5 MG tablet, Take 1 tablet (5 mg total) by mouth daily.   losartan (COZAAR) 100 MG tablet, Take 100 mg by mouth daily.   nitroGLYCERIN (NITROSTAT) 0.4 MG SL tablet, Place 0.4 mg under the tongue every 5 (five) minutes as needed for chest pain.   propranolol (INDERAL) 10 MG tablet, Take 10 mg by mouth as needed (  anxiety).   rosuvastatin  (CRESTOR) 40 MG tablet, Take  1 tablet   Daily  for Cholesterol (Patient taking differently: Take 40 mg by mouth at bedtime.)  Current Outpatient Medications (Respiratory):    cetirizine (ZYRTEC) 10 MG tablet, Take 10 mg by mouth daily in the afternoon.  Current Outpatient Medications (Analgesics):    acetaminophen (TYLENOL) 500 MG tablet, Take 2 tablets (1,000 mg total) by mouth 4 (four) times daily.   aspirin 81 MG tablet, Take 81 mg by mouth daily.   ibuprofen (ADVIL) 600 MG tablet, Take 1 tablet (600 mg total) by mouth 4 (four) times daily.   traMADol (ULTRAM) 50 MG tablet, Take 0.5-1 tablets (25-50 mg total) by mouth every 6 (six) hours as needed for severe pain.   Current Outpatient Medications (Other):    Ascorbic Acid (VITAMIN C) 1000 MG tablet, Take 1,000 mg by mouth daily.   diphenhydramine-acetaminophen (TYLENOL PM) 25-500 MG TABS tablet, Take 2 tablets by mouth at bedtime as needed (sleep/pain).   docusate sodium (COLACE) 100 MG capsule, Take 1 capsule (100 mg total) by mouth 2 (two) times daily.   escitalopram (LEXAPRO) 10 MG tablet, Take 10 mg by mouth daily.   glucose blood (ONETOUCH VERIO) test strip, 1 each by Other route daily in the afternoon. Use as instructed   LUBRICATING PLUS EYE DROPS 0.5 % SOLN, Apply 1 drop to eye 2 (two) times daily as needed (dry eyes).   methocarbamol (ROBAXIN-750) 750 MG tablet, Take 1 tablet (750 mg total) by mouth 4 (four) times daily.   pyridOXINE (VITAMIN B-6) 50 MG tablet, Take 50 mg by mouth daily.     risperiDONE (RISPERDAL) 2 MG tablet, Take 1 mg by mouth at bedtime.   tamsulosin (FLOMAX) 0.4 MG CAPS capsule, Take 0.4 mg by mouth at bedtime.   zolpidem (AMBIEN) 10 MG tablet, Take 10 mg by mouth at bedtime as needed for sleep.  Past Medical History:  Diagnosis Date   Adenocarcinoma of prostate ([place]) 2004   s/p seed implantation   Arthritis    Axillary abscess    left   CAD (coronary artery disease)    Diverticulitis, colon     History of colonic polyps    HTN (hypertension)    Hyperglycemia    diet controlled   Hyperlipidemia    Pre-diabetes    Radiation proctitis    with chronic bleeding     No Known Allergies  ROS: all negative except above.   Physical Exam: There were no vitals filed for this visit. There were no vitals taken for this visit. General Appearance: Well nourished, in no apparent distress. Eyes: PERRLA, EOMs, conjunctiva no swelling or erythema Sinuses: No Frontal/maxillary tenderness ENT/Mouth: Ext aud canals clear, TMs without erythema, bulging. No erythema, swelling, or exudate on post pharynx.  Tonsils not swollen or erythematous. Hearing normal.  Neck: Supple, thyroid normal.  Respiratory: Respiratory effort normal, BS equal bilaterally without rales, rhonchi, wheezing or stridor.  Cardio: RRR with no MRGs. Brisk peripheral pulses without edema.  Abdomen: Soft, + BS.  Non tender, no guarding, rebound, hernias, masses. Lymphatics: Non tender without lymphadenopathy.  Musculoskeletal: Right achilles tenderness to palpation, pain with dorsiflexion and plantar flexion, antalgic gait, favors right foot when walking. Skin: Warm, dry without rashes, lesions, ecchymosis.  Neuro: Cranial nerves intact. Normal muscle tone, no cerebellar symptoms. Sensation intact.  Psych: Awake and oriented X 3, normal affect, Insight and Judgment appropriate.     Darrol Jump, NP 8:48 AM [facility]

## 2023-01-06 NOTE — Patient Instructions (Signed)
Achilles Tendinitis  Achilles tendinitis is inflammation of the tough, cord-like band that attaches the lower leg muscles to the heel bone (Achilles tendon). This is usually caused by overusing the tendon and the ankle joint. Achilles tendinitis usually gets better over time with treatment and caring for yourself at home. It can take weeks or months to heal completely. What are the causes? This condition may be caused by: A sudden increase in exercise or activity, such as running. Doing the same exercises or activities, such as jumping, over and over. Not warming up calf muscles before exercising. Exercising in shoes that are worn out or not made for exercise. Having arthritis or a bone growth (spur) on the back of the heel bone. This can rub against the tendon and hurt it. Age-related wear and tear. Tendons become less flexible with age and are more likely to be injured. What are the signs or symptoms? Common symptoms of this condition include: Pain in the Achilles tendon or in the back of the leg, just above the heel. The pain usually gets worse with exercise. Stiffness or soreness in the back of the leg, especially in the morning. Swelling of the skin over the Achilles tendon. Thickening of the tendon. Trouble standing on tiptoe. How is this diagnosed? This condition is diagnosed based on your symptoms and a physical exam. You may have tests, including: X-rays. MRI. How is this treated? The goal of treatment is to relieve symptoms and help your injury heal. Treatment may include: Decreasing or stopping activities that caused the tendinitis. This may mean switching to low-impact exercises like biking or swimming. Icing the injured area. Doing physical therapy, including strengthening and stretching exercises. Taking NSAIDs, such as ibuprofen, to help relieve pain and swelling. Using supportive shoes, wraps, heel lifts, or a walking boot (air cast). Having surgery. This may be done if  your symptoms do not improve after other treatments. Using high-energy shock wave impulses to stimulate the healing process (extracorporeal shock wave therapy). This is rare. Having an injection of medicines that help relieve inflammation (corticosteroids). This is rare. Follow these instructions at home: If you have an air cast: Wear the air cast as told by your health care provider. Remove it only as told by your health care provider. Loosen it if your toes tingle, become numb, or turn cold and blue. Keep it clean. If the air cast is not waterproof: Do not let it get wet. Cover it with a watertight covering when you take a bath or shower. Managing pain, stiffness, and swelling  If directed, put ice on the injured area. To do this: If you have a removable air cast, remove it as told by your health care provider. Put ice in a plastic bag. Place a towel between your skin and the bag. Leave the ice on for 20 minutes, 2-3 times a day. Move your toes often to reduce stiffness and swelling. Raise (elevate) your foot above the level of your heart while you are sitting or lying down. Activity Gradually return to your normal activities as told by your health care provider. Ask your health care provider what activities are safe for you. Do not do activities that cause pain. Consider doing low-impact exercises, like cycling or swimming. Ask your health care provider when it is safe to drive if you have an air cast on your foot. If physical therapy was prescribed, do exercises as told by your health care provider or physical therapist. General instructions If directed, wrap your  foot with an elastic bandage or other wrap. This can help to keep your tendon from moving too much while it heals. Your health care provider will show you how to wrap your foot correctly. Wear supportive shoes or heel lifts only as told by your health care provider. Take over-the-counter and prescription medicines only as  told by your health care provider. Keep all follow-up visits as told by your health care provider. This is important. Contact a health care provider if you: Have symptoms that get worse. Have pain that does not get better with medicine. Develop new, unexplained symptoms. Develop warmth and swelling in your foot. Have a fever. Get help right away if you: Have a sudden popping sound or sensation in your Achilles tendon followed by severe pain. Cannot move your toes or foot. Cannot put any weight on your foot. Your foot or toes become numb and look white or blue even after loosening your bandage or air cast. Summary Achilles tendinitis is inflammation of the tough, cord-like band that attaches the lower leg muscles to the heel bone (Achilles tendon). This condition is usually caused by overusing the tendon and the ankle joint. It can also be caused by arthritis or normal aging. The most common symptoms of this condition include pain, swelling, or stiffness in the Achilles tendon or in the back of the leg. This condition is usually treated by decreasing or stopping activities that caused the tendinitis, icing the injured area, taking NSAIDs, and doing physical therapy. This information is not intended to replace advice given to you by your health care provider. Make sure you discuss any questions you have with your health care provider. Document Revised: 03/26/2019 Document Reviewed: 03/26/2019 Elsevier Patient Education  [place].

## 2023-01-07 ENCOUNTER — Ambulatory Visit: Payer: Medicare Other | Admitting: Nurse Practitioner

## 2023-01-08 LAB — COMPLETE METABOLIC PANEL WITH GFR
AG Ratio: 1.6 (calc) (ref 1.0–2.5)
ALT: 36 U/L (ref 9–46)
AST: 30 U/L (ref 10–35)
Albumin: 3.9 g/dL (ref 3.6–5.1)
Alkaline phosphatase (APISO): 62 U/L (ref 35–144)
BUN/Creatinine Ratio: 6 (calc) (ref 6–22)
BUN: 9 mg/dL (ref 7–25)
CO2: 30 mmol/L (ref 20–32)
Calcium: 10.2 mg/dL (ref 8.6–10.3)
Chloride: 107 mmol/L (ref 98–110)
Creat: 1.43 mg/dL — ABNORMAL HIGH (ref 0.70–1.22)
Globulin: 2.5 g/dL (calc) (ref 1.9–3.7)
Glucose, Bld: 99 mg/dL (ref 65–99)
Potassium: 4 mmol/L (ref 3.5–5.3)
Sodium: 144 mmol/L (ref 135–146)
Total Bilirubin: 0.5 mg/dL (ref 0.2–1.2)
Total Protein: 6.4 g/dL (ref 6.1–8.1)
eGFR: 49 mL/min/{1.73_m2} — ABNORMAL LOW (ref >=60)

## 2023-01-08 LAB — CBC WITH DIFFERENTIAL/PLATELET
Absolute Monocytes: 400 cells/uL (ref 200–950)
Basophils Absolute: 40 cells/uL (ref 0–200)
Basophils Relative: 0.9 %
Eosinophils Absolute: 119 cells/uL (ref 15–500)
Eosinophils Relative: 2.7 %
HCT: 40.6 % (ref 38.5–50.0)
Hemoglobin: 12.6 g/dL — ABNORMAL LOW (ref 13.2–17.1)
Lymphs Abs: 1628 cells/uL (ref 850–3900)
MCH: 25.1 pg — ABNORMAL LOW (ref 27.0–33.0)
MCHC: 31 g/dL — ABNORMAL LOW (ref 32.0–36.0)
MCV: 80.9 fL (ref 80.0–100.0)
MPV: 10.5 fL (ref 7.5–12.5)
Monocytes Relative: 9.1 %
Neutro Abs: 2213 cells/uL (ref 1500–7800)
Neutrophils Relative %: 50.3 %
Platelets: 247 10*3/uL (ref 140–400)
RBC: 5.02 10*6/uL (ref 4.20–5.80)
RDW: 15.1 % — ABNORMAL HIGH (ref 11.0–15.0)
Total Lymphocyte: 37 %
WBC: 4.4 10*3/uL (ref 3.8–10.8)

## 2023-01-08 LAB — HEMOGLOBIN A1C
Hgb A1c MFr Bld: 6.1 % of total Hgb — ABNORMAL HIGH (ref <5.7)
Mean Plasma Glucose: 128 mg/dL
eAG (mmol/L): 7.1 mmol/L

## 2023-01-13 ENCOUNTER — Ambulatory Visit
Admission: RE | Admit: 2023-01-13 | Discharge: 2023-01-13 | Disposition: A | Discharge status: Home / Self Care | Payer: Medicare Other | Source: Ambulatory Visit | Attending: Nurse Practitioner | Admitting: Nurse Practitioner

## 2023-01-13 DIAGNOSIS — R52 Pain, unspecified: Secondary | ICD-10-CM

## 2023-01-13 DIAGNOSIS — M7661 Achilles tendinitis, right leg: Secondary | ICD-10-CM

## 2023-02-17 ENCOUNTER — Encounter: Payer: Self-pay | Admitting: Internal Medicine

## 2023-02-21 ENCOUNTER — Ambulatory Visit: Payer: Medicare Other | Admitting: Nurse Practitioner

## 2023-03-02 ENCOUNTER — Ambulatory Visit (INDEPENDENT_AMBULATORY_CARE_PROVIDER_SITE_OTHER): Payer: Medicare Other | Admitting: Nurse Practitioner

## 2023-03-02 ENCOUNTER — Other Ambulatory Visit: Payer: Self-pay | Admitting: Nurse Practitioner

## 2023-03-02 ENCOUNTER — Encounter: Payer: Self-pay | Admitting: Nurse Practitioner

## 2023-03-02 VITALS — BP 168/80 | HR 61 | Temp 97.9°F | Ht 69.0 in | Wt 175.0 lb

## 2023-03-02 DIAGNOSIS — N1831 Chronic kidney disease, stage 3a: Secondary | ICD-10-CM

## 2023-03-02 DIAGNOSIS — D126 Benign neoplasm of colon, unspecified: Secondary | ICD-10-CM

## 2023-03-02 DIAGNOSIS — M1711 Unilateral primary osteoarthritis, right knee: Secondary | ICD-10-CM

## 2023-03-02 DIAGNOSIS — E785 Hyperlipidemia, unspecified: Secondary | ICD-10-CM

## 2023-03-02 DIAGNOSIS — Z0000 Encounter for general adult medical examination without abnormal findings: Secondary | ICD-10-CM

## 2023-03-02 DIAGNOSIS — E559 Vitamin D deficiency, unspecified: Secondary | ICD-10-CM

## 2023-03-02 DIAGNOSIS — G4700 Insomnia, unspecified: Secondary | ICD-10-CM

## 2023-03-02 DIAGNOSIS — M1712 Unilateral primary osteoarthritis, left knee: Secondary | ICD-10-CM

## 2023-03-02 DIAGNOSIS — I2510 Atherosclerotic heart disease of native coronary artery without angina pectoris: Secondary | ICD-10-CM | POA: Diagnosis not present

## 2023-03-02 DIAGNOSIS — M1000 Idiopathic gout, unspecified site: Secondary | ICD-10-CM

## 2023-03-02 DIAGNOSIS — Z0001 Encounter for general adult medical examination with abnormal findings: Secondary | ICD-10-CM | POA: Diagnosis not present

## 2023-03-02 DIAGNOSIS — Z79899 Other long term (current) drug therapy: Secondary | ICD-10-CM

## 2023-03-02 DIAGNOSIS — C61 Malignant neoplasm of prostate: Secondary | ICD-10-CM

## 2023-03-02 DIAGNOSIS — K520 Gastroenteritis and colitis due to radiation: Secondary | ICD-10-CM

## 2023-03-02 DIAGNOSIS — K5730 Diverticulosis of large intestine without perforation or abscess without bleeding: Secondary | ICD-10-CM

## 2023-03-02 DIAGNOSIS — F4310 Post-traumatic stress disorder, unspecified: Secondary | ICD-10-CM

## 2023-03-02 DIAGNOSIS — E1122 Type 2 diabetes mellitus with diabetic chronic kidney disease: Secondary | ICD-10-CM

## 2023-03-02 DIAGNOSIS — E1169 Type 2 diabetes mellitus with other specified complication: Secondary | ICD-10-CM | POA: Diagnosis not present

## 2023-03-02 DIAGNOSIS — I10 Essential (primary) hypertension: Secondary | ICD-10-CM

## 2023-03-02 DIAGNOSIS — Z6825 Body mass index (BMI) 25.0-25.9, adult: Secondary | ICD-10-CM

## 2023-03-02 DIAGNOSIS — R6889 Other general symptoms and signs: Secondary | ICD-10-CM | POA: Diagnosis not present

## 2023-03-02 MED ORDER — PROPRANOLOL HCL 10 MG PO TABS: 10.0000 mg | ORAL_TABLET | Freq: Every day | ORAL | 2 refills | Status: DC

## 2023-03-02 MED ORDER — LOSARTAN POTASSIUM 100 MG PO TABS: 100.0000 mg | ORAL_TABLET | Freq: Every day | ORAL | 2 refills | Status: DC

## 2023-03-02 MED ORDER — AMLODIPINE BESYLATE 5 MG PO TABS: 5.0000 mg | ORAL_TABLET | Freq: Every day | ORAL | 0 refills | Status: DC

## 2023-03-02 NOTE — Patient Instructions (Signed)

## 2023-03-02 NOTE — Progress Notes (Signed)
MEDICARE ANNUAL WELLNESS VISIT AND FOLLOW UP Assessment:   Encounter for Medicare annual wellness exam Due annually Health maintenance reviewed  Essential hypertension Elevated in clinic - asymptomatic Non-compliant - only taking Amlodipine today. Discussed importance of medication compliance. Discussed DASH (Dietary Approaches to Stop Hypertension) DASH diet is lower in sodium than a typical American diet. Cut back on foods that are high in saturated fat, cholesterol, and trans fats. Eat more whole-grain foods, fish, poultry, and nuts Remain active and exercise as tolerated daily.  Monitor BP at home-Call if greater than 130/80.  Check CMP/CBC  Atherosclerosis of native coronary artery without angina pectoris, unspecified whether native or transplanted heart/Hyperlipidemia Continue Rosuvastatin Discussed lifestyle modifications. Recommended diet heavy in fruits and veggies, omega 3's. Decrease consumption of animal meats, cheeses, and dairy products. Remain active and exercise as tolerated. Continue to monitor. Check lipids/TSH  Diet-controlled diabetes mellitus [facility]) Education: Reviewed 'ABCs' of diabetes management  Discussed goals to be met and/or maintained include A1C (<7) Blood pressure (<130/80) Cholesterol (LDL <70) Continue Eye Exam yearly  Continue Dental Exam Q6 mo Discussed dietary recommendations Discussed Physical Activity recommendations Foot exam UTD Check A1C  Benign neoplasm of colon, unspecified part of colon/Diverticulosis Continue follow up; UTD on colonoscopies - was due 2022 has not followed up. 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 Physical activity. Encouraged/praised to build confidence.   Idiopathic gout, unspecified chronicity, unspecified site Gout- recheck Uric acid as needed, Diet discussed, continue medications.  Insomnia Discussed good sleep hygiene. Establish bed and wake times. Sleep restriction-only sleep estimated hrs sleep. Bed only for sex and sleep, only sleep when sleepy, out of bed if anxious (stimulus control). Reviewed relaxation techniques, mindful meditations. Expected sleep duration. Addressed worries about not sleeping.   Arthritis of both knees Managed by [place]; Doing PT Trying to postpone surgery  Ice/Heat/RICE Method Brace support Continue to monitor  Orders Placed This Encounter  Procedures   CBC with Differential/Platelet   COMPLETE METABOLIC PANEL WITH GFR   Lipid panel   Hemoglobin A1c   VITAMIN D 25 Hydroxy (Vit-D Deficiency, Fractures)   Ambulatory referral to Gastroenterology    Referral Priority:   Routine    Referral Type:   Consultation    Referral Reason:   Specialty Services Required    Number of Visits Requested:   1    Notify office for further evaluation and treatment, questions or concerns if any reported s/s fail to improve.   The patient was advised to call back or seek an in-person evaluation if any symptoms worsen or if the condition fails to improve as anticipated.   Further disposition pending results of labs. Discussed med's effects and SE's.    I discussed the assessment and treatment plan with the patient. The patient was provided an opportunity to ask questions and all were answered. The patient agreed with the plan and demonstrated an understanding of the instructions.  Discussed med's effects and SE's. Screening labs and tests as requested with regular follow-up as recommended.  I provided 35 minutes of face-to-face time during this  encounter including counseling, chart review, and critical decision making  was preformed.  Today's Plan of Care is based on a patient-centered health care approach known as shared decision making - the decisions, tests and treatments allow for patient preferences and values to be balanced with clinical evidence.     Future Appointments  Date Time Provider Department Center  06/14/2023 10:00 AM Lucky CowboyMcKeown, William, MD [facility] None  03/12/2024 11:00 AM Adela Glimpseranford, Felice Deem, NP [facility] None    Plan:   During the course of the visit the patient was educated and counseled about appropriate screening and preventive services including:   Pneumococcal vaccine  Influenza vaccine Prevnar 13 Td vaccine Screening electrocardiogram Colorectal cancer screening Diabetes screening Glaucoma screening Nutrition counseling    Subjective:  [name] Colon is a 82 y.o. male who presents for Medicare Annual Wellness Visit and 3 month follow up for HTN, hyperlipidemia, diet controlled T2DM, and vitamin D Def.   Overall he reports feeling well today.  He has no new concerns at this time.  Has bilateral OA knees. Has mobic that helps some, [facility]. Currently doing PT. Has brace/cane but not using.   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  He has insomnia treated by Remus Lofflerambien, no longer taking.  Report he sleeps well with this without notable side effects. He works with [employer] for [employer].  BMI is Body mass index is 25.84 kg/m., he has been working on diet and exercise. Wt Readings from Last 3 Encounters:  03/02/23 175 lb (79.4 kg)  01/06/23 181 lb 9.6 oz (82.4 kg)  12/28/22 186 lb (84.4 kg)   He is s/p stent in 2009, had normal stress test 02/2016 with Dr. Antoine PocheHochrein. Is prescribed nitroglycerine but has never needed.  His blood pressure has been controlled at home,  today their BP is BP: (!) 168/80  He does not workout. He denies chest pain, shortness of breath, dizziness.  He does note medication non compliance.  He states today he only took Amlodipine 5 mg.  He is Rx Losartan and  Propranolol but did not take.     He is on cholesterol medication (simvastatin 20 mg daily) and denies myalgias. His cholesterol is at goal. The cholesterol last visit was:   Lab Results  Component Value Date   CHOL 131 11/25/2022   HDL 49 11/25/2022   LDLCALC 68 11/25/2022   TRIG 64 11/25/2022   CHOLHDL 2.7 11/25/2022   He has been working on diet and exercise for Diabetes with diabetic chronic kidney disease, has been in and out of DM range x 2003, currently with diet and exercise he is in the preDM range, he is on bASA, he is on ACE/ARB, and denies paresthesia of the feet, polydipsia, polyuria and visual disturbances. Last A1C was:  Lab Results  Component Value Date   HGBA1C 6.1 (H) 01/06/2023   Patient is on Vitamin D supplement.   Lab Results  Component Value Date   VD25OH 58 11/25/2022     He has stable CKD II associated with T2DM monitored via this office:  Lab Results  Component Value Date   GFRAA 49 (L) 05/13/2021   Patient is on allopurinol for gout and does not report a recent flare.  Lab Results  Component Value Date   LABURIC 3.7 (L) 11/25/2022    Medication Review: Current Outpatient Medications on File Prior to Visit  Medication Sig Dispense Refill   Ascorbic  Acid (VITAMIN C) 1000 MG tablet Take 1,000 mg by mouth daily.     aspirin 81 MG tablet Take 81 mg by mouth daily.     cetirizine (ZYRTEC) 10 MG tablet Take 10 mg by mouth daily in the afternoon.     diphenhydramine-acetaminophen (TYLENOL PM) 25-500 MG TABS tablet Take 2 tablets by mouth at bedtime as needed (sleep/pain).     docusate sodium (COLACE) 100 MG capsule Take 1 capsule (100 mg total) by mouth 2 (two) times daily. 60 capsule 2   escitalopram (LEXAPRO) 10 MG tablet Take 10 mg by  mouth daily.     glucose blood (ONETOUCH VERIO) test strip 1 each by Other route daily in the afternoon. Use as instructed 100 each 3   ibuprofen (ADVIL) 600 MG tablet Take 1 tablet (600 mg total) by mouth 4 (four) times daily. 120 tablet 1   LUBRICATING PLUS EYE DROPS 0.5 % SOLN Apply 1 drop to eye 2 (two) times daily as needed (dry eyes).     methocarbamol (ROBAXIN-750) 750 MG tablet Take 1 tablet (750 mg total) by mouth 4 (four) times daily. 120 tablet 1   nitroGLYCERIN (NITROSTAT) 0.4 MG SL tablet Place 0.4 mg under the tongue every 5 (five) minutes as needed for chest pain.     pyridOXINE (VITAMIN B-6) 50 MG tablet Take 50 mg by mouth daily.       risperiDONE (RISPERDAL) 2 MG tablet Take 1 mg by mouth at bedtime.     rosuvastatin (CRESTOR) 40 MG tablet Take  1 tablet   Daily  for Cholesterol (Patient taking differently: Take 40 mg by mouth at bedtime.) 90 tablet 3   tamsulosin (FLOMAX) 0.4 MG CAPS capsule Take 0.4 mg by mouth at bedtime.     traMADol (ULTRAM) 50 MG tablet Take 0.5-1 tablets (25-50 mg total) by mouth every 6 (six) hours as needed for severe pain. 20 tablet 0   zolpidem (AMBIEN) 10 MG tablet Take 10 mg by mouth at bedtime as needed for sleep.     acetaminophen (TYLENOL) 500 MG tablet Take 2 tablets (1,000 mg total) by mouth 4 (four) times daily. (Patient not taking: Reported on 01/06/2023) 120 tablet 3   No current facility-administered medications on file prior to visit.    Allergies: No Known Allergies  Current Problems (verified) has ADENOCARCINOMA, PROSTATE; COLONIC POLYPS; Hyperlipidemia associated with type 2 diabetes mellitus; Essential hypertension; Coronary atherosclerosis; RADIATION PROCTITIS; Diverticulosis of colon; Diet-controlled diabetes mellitus; Medication management; Vitamin D deficiency; PTSD (post-traumatic stress disorder); Obesity (BMI 30.0-34.9); Gout; Chronic kidney disease, stage 3a; Insomnia; Murmur; Microalbuminuria due to type 2 diabetes mellitus;  Hypercalcemia; Syncope and collapse; Sinus bradycardia; Hyperparathyroidism due to renal insufficiency; Hyperparathyroidism; Type 2 diabetes mellitus with stage 3b chronic kidney disease, without long-term current use of insulin; Osteoarthritis of right knee; and Acute appendicitis on their problem list.  Screening Tests Immunization History  Administered Date(s) Administered   Fluad Quad(high Dose 65+) 09/02/2020   Influenza, High Dose Seasonal PF 09/08/2018, 07/24/2019   Influenza-Unspecified 08/22/2014, 09/03/2015, 08/25/2016   Moderna Sars-Covid-2 Vaccination 12/06/2019, 01/03/2020, 09/22/2020   Pfizer Covid-19 Vaccine Bivalent Booster 75yrs & up 03/03/2021, 08/20/2021   Pneumococcal Conjugate-13 09/03/2015   Pneumococcal-Unspecified 08/25/2016   Td 11/22/2006   Preventative care: Last colonoscopy: 02/22/2018 due 2022 - Overdue  CXR 2018 Ct head 2013  Prior vaccinations: TD or Tdap: 2008 declines Influenza: 07/2019 Pneumococcal: 2017 Prevnar13: 2016 Shingles/Zostavax: declines  Names of Other Physician/Practitioners you currently use: 1. [facility] here for [facility]. [facility], last visit  2022 - plans to schedule 3. Dentist, last visit 20239, q71m  Patient Care Team: Lucky Cowboy, MD as PCP - General (Internal Medicine) Rollene Rotunda, MD as PCP - Cardiology (Cardiology) Rollene Rotunda, MD as Consulting Physician (Cardiology) Hilarie Fredrickson, MD as Consulting Physician (Gastroenterology) Ihor Gully, MD (Inactive) as Consulting Physician (Urology)  Surgical: He  has a past surgical history that includes Coronary angioplasty with stent (04/2008); Colonoscopy w/ polypectomy; Mass excision (Left, 10/06/2017); Colonoscopy; IR Radiologist Eval & Mgmt (07/22/2021); IR Radiologist Eval & Mgmt (10/20/2021); Total knee arthroplasty (Right, 09/06/2022); and laparoscopic appendectomy (N/A, 12/25/2022). Family His family history  includes Heart disease in his father; Hypertension in his father; Hypotension in his mother; Kidney disease in his brother. Social history  He reports that he quit smoking about 30 years ago. His smoking use included cigarettes. He has never used smokeless tobacco. He reports that he does not drink alcohol and does not use drugs.  MEDICARE WELLNESS OBJECTIVES: Physical activity: Current Exercise Habits: Home exercise routine Cardiac risk factors:   Depression/mood screen:      11/25/2022    9:23 PM  Depression screen PHQ 2/9  Decreased Interest 0  Down, Depressed, Hopeless 0  PHQ - 2 Score 0    ADLs:     03/02/2023    1:57 PM 12/25/2022    8:49 PM  In your present state of health, do you have any difficulty performing the following activities:  Hearing? 0 1  Vision? 0 0  Difficulty concentrating or making decisions? 0 0  Walking or climbing stairs? 0 0  Dressing or bathing? 0 0  Doing errands, shopping? 0 0  Preparing Food and eating ? N   Using the Toilet? N   In the past six months, have you accidently leaked urine? N   Do you have problems with loss of bowel control? N   Managing your Medications? N   Comment Reminded to take as directed   Managing your Finances? N   Housekeeping or managing your Housekeeping? N      Cognitive Testing  Alert? Yes  Normal Appearance?Yes  Oriented to person? Yes  Place? Yes   Time? Yes  Recall of three objects?  Yes  Can perform simple calculations? Yes  Displays appropriate judgment?Yes  Can read the correct time from a watch face?Yes  EOL planning: Does Patient Have a Medical Advance Directive?: No   Objective:   Today's Vitals   03/02/23 1049  BP: (!) 168/80  Pulse: 61  Temp: 97.9 F (36.6 C)  SpO2: 99%  Weight: 175 lb (79.4 kg)  Height: 5\' 9"  (1.753 m)   Body mass index is 25.84 kg/m.  General appearance: alert, no distress, WD/WN, male HEENT: normocephalic, sclerae anicteric, TMs pearly, nares patent, no discharge or  erythema, pharynx normal. Mildly HOH.  Oral cavity: MMM, no lesions Neck: supple, no lymphadenopathy, no thyromegaly, no masses Heart: RRR, normal S1, S2, Mild 2/6 systolic murmur without radiation best heard R 2nd ICS Lungs: CTA bilaterally, + worse RLL but bilateral wheezes, but no rhonchi, or rales Abdomen: +bs, obese,  soft, non tender, non distended, no masses, no hepatomegaly, no splenomegaly Musculoskeletal: nontender, no swelling, no obvious deformity, slow antalgic gait Extremities: 1+ edema, no cyanosis, no clubbing Pulses: 2+ symmetric, upper and lower extremities, normal cap refill Neurological: alert, oriented x 3, CN2-12 intact, strength normal upper extremities and lower extremities, sensation  normal throughout, DTRs 2+ throughout, no cerebellar signs, gait slow antalgic Psychiatric: normal affect, behavior normal, pleasant   Medicare Attestation I have personally reviewed: The patient's medical and social history Their use of alcohol, tobacco or illicit drugs Their current medications and supplements The patient's functional ability including ADLs,fall risks, home safety risks, cognitive, and hearing and visual impairment Diet and physical activities Evidence for depression or mood disorders  The patient's weight, height, BMI, and visual acuity have been recorded in the chart.  I have made referrals, counseling, and provided education to the patient based on review of the above and I have provided the patient with a written personalized care plan for preventive services.     Adela Glimpse, NP   03/02/2023

## 2023-03-03 LAB — HEMOGLOBIN A1C
Hgb A1c MFr Bld: 6 % of total Hgb — ABNORMAL HIGH (ref <5.7)
Mean Plasma Glucose: 126 mg/dL
eAG (mmol/L): 7 mmol/L

## 2023-03-03 LAB — CBC WITH DIFFERENTIAL/PLATELET
Absolute Monocytes: 334 cells/uL (ref 200–950)
Basophils Absolute: 22 cells/uL (ref 0–200)
Basophils Relative: 0.5 %
Eosinophils Absolute: 92 cells/uL (ref 15–500)
Eosinophils Relative: 2.1 %
HCT: 44.1 % (ref 38.5–50.0)
Hemoglobin: 13.7 g/dL (ref 13.2–17.1)
Lymphs Abs: 2064 cells/uL (ref 850–3900)
MCH: 25 pg — ABNORMAL LOW (ref 27.0–33.0)
MCHC: 31.1 g/dL — ABNORMAL LOW (ref 32.0–36.0)
MCV: 80.3 fL (ref 80.0–100.0)
MPV: 11.1 fL (ref 7.5–12.5)
Monocytes Relative: 7.6 %
Neutro Abs: 1888 cells/uL (ref 1500–7800)
Neutrophils Relative %: 42.9 %
Platelets: 192 10*3/uL (ref 140–400)
RBC: 5.49 10*6/uL (ref 4.20–5.80)
RDW: 14.9 % (ref 11.0–15.0)
Total Lymphocyte: 46.9 %
WBC: 4.4 10*3/uL (ref 3.8–10.8)

## 2023-03-03 LAB — COMPLETE METABOLIC PANEL WITH GFR
AG Ratio: 1.5 (calc) (ref 1.0–2.5)
ALT: 10 U/L (ref 9–46)
AST: 19 U/L (ref 10–35)
Albumin: 4.5 g/dL (ref 3.6–5.1)
Alkaline phosphatase (APISO): 68 U/L (ref 35–144)
BUN/Creatinine Ratio: 8 (calc) (ref 6–22)
BUN: 13 mg/dL (ref 7–25)
CO2: 27 mmol/L (ref 20–32)
Calcium: 11 mg/dL — ABNORMAL HIGH (ref 8.6–10.3)
Chloride: 106 mmol/L (ref 98–110)
Creat: 1.54 mg/dL — ABNORMAL HIGH (ref 0.70–1.22)
Globulin: 3 g/dL (calc) (ref 1.9–3.7)
Glucose, Bld: 98 mg/dL (ref 65–99)
Potassium: 4 mmol/L (ref 3.5–5.3)
Sodium: 143 mmol/L (ref 135–146)
Total Bilirubin: 0.7 mg/dL (ref 0.2–1.2)
Total Protein: 7.5 g/dL (ref 6.1–8.1)
eGFR: 45 mL/min/{1.73_m2} — ABNORMAL LOW (ref >=60)

## 2023-03-03 LAB — LIPID PANEL
Cholesterol: 165 mg/dL (ref <200)
HDL: 51 mg/dL (ref >=40)
LDL Cholesterol (Calc): 98 mg/dL (calc)
Non-HDL Cholesterol (Calc): 114 mg/dL (calc) (ref <130)
Total CHOL/HDL Ratio: 3.2 (calc) (ref <5.0)
Triglycerides: 70 mg/dL (ref <150)

## 2023-03-03 LAB — VITAMIN D 25 HYDROXY (VIT D DEFICIENCY, FRACTURES): Vit D, 25-Hydroxy: 61 ng/mL (ref 30–100)

## 2023-05-17 ENCOUNTER — Encounter: Payer: Medicare Other | Admitting: Internal Medicine

## 2023-05-23 ENCOUNTER — Encounter: Payer: Medicare Other | Admitting: Internal Medicine

## 2023-06-10 ENCOUNTER — Encounter: Payer: Medicare Other | Admitting: Internal Medicine

## 2023-06-14 ENCOUNTER — Encounter: Payer: Medicare Other | Admitting: Internal Medicine

## 2023-06-20 ENCOUNTER — Encounter: Payer: Self-pay | Admitting: Internal Medicine

## 2023-06-20 ENCOUNTER — Ambulatory Visit (INDEPENDENT_AMBULATORY_CARE_PROVIDER_SITE_OTHER): Payer: Medicare Other | Admitting: Internal Medicine

## 2023-06-20 VITALS — BP 136/82 | HR 55 | Temp 97.9°F | Resp 16 | Ht 69.0 in | Wt 182.6 lb

## 2023-06-20 DIAGNOSIS — Z79899 Other long term (current) drug therapy: Secondary | ICD-10-CM

## 2023-06-20 DIAGNOSIS — E1169 Type 2 diabetes mellitus with other specified complication: Secondary | ICD-10-CM

## 2023-06-20 DIAGNOSIS — Z136 Encounter for screening for cardiovascular disorders: Secondary | ICD-10-CM

## 2023-06-20 DIAGNOSIS — Z8546 Personal history of malignant neoplasm of prostate: Secondary | ICD-10-CM

## 2023-06-20 DIAGNOSIS — E1122 Type 2 diabetes mellitus with diabetic chronic kidney disease: Secondary | ICD-10-CM | POA: Diagnosis not present

## 2023-06-20 DIAGNOSIS — I10 Essential (primary) hypertension: Secondary | ICD-10-CM | POA: Diagnosis not present

## 2023-06-20 DIAGNOSIS — N1831 Chronic kidney disease, stage 3a: Secondary | ICD-10-CM

## 2023-06-20 DIAGNOSIS — I2510 Atherosclerotic heart disease of native coronary artery without angina pectoris: Secondary | ICD-10-CM

## 2023-06-20 DIAGNOSIS — Z1211 Encounter for screening for malignant neoplasm of colon: Secondary | ICD-10-CM

## 2023-06-20 DIAGNOSIS — E559 Vitamin D deficiency, unspecified: Secondary | ICD-10-CM

## 2023-06-20 DIAGNOSIS — Z125 Encounter for screening for malignant neoplasm of prostate: Secondary | ICD-10-CM

## 2023-06-20 DIAGNOSIS — E785 Hyperlipidemia, unspecified: Secondary | ICD-10-CM

## 2023-06-20 DIAGNOSIS — N2581 Secondary hyperparathyroidism of renal origin: Secondary | ICD-10-CM

## 2023-06-20 DIAGNOSIS — I700 Atherosclerosis of aorta: Secondary | ICD-10-CM | POA: Diagnosis not present

## 2023-06-20 DIAGNOSIS — Z8249 Family history of ischemic heart disease and other diseases of the circulatory system: Secondary | ICD-10-CM

## 2023-06-20 DIAGNOSIS — M1000 Idiopathic gout, unspecified site: Secondary | ICD-10-CM

## 2023-06-20 LAB — CBC WITH DIFFERENTIAL/PLATELET
Absolute Monocytes: 348 cells/uL (ref 200–950)
Basophils Absolute: 22 cells/uL (ref 0–200)
Basophils Relative: 0.5 %
Eosinophils Absolute: 128 cells/uL (ref 15–500)
Eosinophils Relative: 2.9 %
HCT: 49.2 % (ref 38.5–50.0)
Hemoglobin: 15.6 g/dL (ref 13.2–17.1)
Lymphs Abs: 1923 cells/uL (ref 850–3900)
MCH: 25.1 pg — ABNORMAL LOW (ref 27.0–33.0)
MCHC: 31.7 g/dL — ABNORMAL LOW (ref 32.0–36.0)
MCV: 79.2 fL — ABNORMAL LOW (ref 80.0–100.0)
MPV: 11.8 fL (ref 7.5–12.5)
Monocytes Relative: 7.9 %
Neutro Abs: 1980 cells/uL (ref 1500–7800)
Neutrophils Relative %: 45 %
Platelets: 186 10*3/uL (ref 140–400)
RBC: 6.21 10*6/uL — ABNORMAL HIGH (ref 4.20–5.80)
RDW: 14.7 % (ref 11.0–15.0)
Total Lymphocyte: 43.7 %
WBC: 4.4 10*3/uL (ref 3.8–10.8)

## 2023-06-20 NOTE — Patient Instructions (Signed)

## 2023-06-20 NOTE — Progress Notes (Signed)
Comprehensive Evaluation & Examination  Future Appointments  Date Time Provider Department Center  03/12/2024 11:00 AM Adela Glimpse, NP [facility] None  06/26/2024 10:00 AM Lucky Cowboy, MD [facility] None            This very nice 82 y.o. MBM presents for a comprehensive evaluation and management of multiple medical co-morbidities.  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 Patient has hx/o Gout controlled on Allopurinol. Patient was treated by Brachytherapy for  Prostate Cancer  in 2004.                                                   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 Dr Thedore Mins also referred patient to Dr Lonzo Cloud who confirmed his suspicions of secondary hyperparathyroidism.        HTN predates since 50. Patient's BP has been controlled at home.Today's BP is   at goal - 136/82.  In 2009,  patient had Coronary PCA with Stent implanted & he had a negative Lexiscan 2017.  Patient denies any cardiac symptoms as chest pain, palpitations, shortness of breath, dizziness or ankle swelling.       Patient's hyperlipidemia is controlled with diet and medications. Patient denies myalgias or other medication SE's. 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   Patient denies reactive hypoglycemic symptoms, visual blurring, diabetic polys or paresthesias.  Patient admits not checking his CBG's as advised by all of his doctors.  Last A1c was not at goal :   Lab Results  Component Value Date   HGBA1C 6.0 (H) 03/02/2023          Finally, patient has history of Vitamin D Deficiency ("38" / 2012)  and last vitamin D was at goal :   Lab Results  Component Value Date   VD25OH 61 03/02/2023      Current Outpatient Medications on File Prior to Visit  Medication Sig   allopurinol 300 MG tablet Take daily.   amLODipine 10 MG tablet Take 1 tablet daily.   aspirin 81 MG tablet Take  daily.   cetirizine  10 MG tablet Take daily in the afternoon.   VITAMIN D 5000 u Take  daily.   Escitalopram 10 MG tablet Take every evening.   hydrochlorothiazide 25 MG tablet Take daily.   losartan  100 MG tablet Take daily.   LUBRICATING EYE DROPS SOLN Apply 1 drop to eye 2 times daily.   NITROSTAT)0.4 MG SL tablet as needed for chest pain.   potassium chloride 20 MEQ tablet Take 1 tablet 2 x /day     propranolol 10 MG tablet Take as needed (anxiety).   pyridOXINE VIT, B-650 MG tablet Take  daily.     risperiDONE 2 MG tablet Take at bedtime.   rosuvastatin 40 MG tablet Take 4at bedtime.   tamsulosin 0.4 MG CAPS capsule Take  at bedtime.   zolpidem 10 MG tablet Take 1 tablet at bedtime.    No Known Allergies    Health Maintenance  Topic Date Due   Zoster Vaccines- Shingrix (1 of 2) Never done   OPHTHALMOLOGY EXAM  04/27/2018   COVID-19 Vaccine (3 - Moderna risk series) 01/31/2020   FOOT EXAM  02/24/2021   HEMOGLOBIN A1C  03/10/2021   INFLUENZA VACCINE  06/22/2021   TETANUS/TDAP  03/17/2027   PNA vac Low Risk Adult  Completed   HPV VACCINES  Aged Out     Immunization History  Administered Date(s) Administered   Fluad Quad (high Dose ) 09/02/2020   Influenza, High Dose  09/08/2018, 07/24/2019   Influenza 08/22/2014, 09/03/2015, 08/25/2016   Moderna Sars-Covid-2 Vacc 12/06/2019, 01/03/2020   Pneumococcal -13 09/03/2015   Pneumococcal-23 08/25/2016   Td 11/22/2006    Last Colon -  02/22/2018 - Dr Marina Goodell - Multiple Polyps - recc 3 yr f/u if medically stable - due Apr 2022  Past Surgical History:   Procedure Laterality Date   COLONOSCOPY  02/22/2018   COLONOSCOPY W/ POLYPECTOMY     CORONARY ANGIOPLASTY WITH STENT   04/2008   MASS EXCISION Left 10/06/2017   Procedure: EXCISION LEFT AXILLA CYST;  Harriette Bouillon, MD     Family History  Problem Relation Age of Onset   Heart disease Father    Hypertension Father    Hypotension Mother    Kidney disease Brother    Colon cancer Neg Hx     Social History   Socioeconomic History   Marital status: Married    Spouse name: Not on file   Number of children: 2  Occupational History   Occupation: retired    Associate Professor: Retired  Tobacco Use   Smoking status: Former    Pack years: 0.00    Types: Cigarettes    Quit date: 11/22/1992    Years since quitting: 28.4   Smokeless tobacco: Never  Substance and Sexual Activity   Alcohol use: No   Drug use: No   Sexual activity: Not Currently    ROS Constitutional: Denies fever, chills, weight loss/gain, headaches, insomnia,  night sweats or change in appetite. Does c/o fatigue. Eyes: Denies redness, blurred vision, diplopia, discharge, itchy or watery eyes.  ENT: Denies discharge, congestion, post nasal drip, epistaxis, sore throat, earache, hearing loss, dental pain, Tinnitus, Vertigo, Sinus pain or snoring.  Cardio: Denies chest pain, palpitations, irregular heartbeat, syncope, dyspnea, diaphoresis, orthopnea, PND, claudication or edema Respiratory: denies cough, dyspnea, DOE, pleurisy, hoarseness, laryngitis or wheezing.  Gastrointestinal: Denies dysphagia, heartburn, reflux, water brash, pain, cramps, nausea, vomiting, bloating, diarrhea, constipation, hematemesis, melena, hematochezia, jaundice or hemorrhoids Genitourinary: Denies dysuria, frequency, discharge, hematuria or flank pain. Has urgency, nocturia x 2-3 & occasional hesitancy. Musculoskeletal: Denies arthralgia, myalgia, stiffness, Jt. Swelling, pain, limp or strain/sprain. Denies Falls. Skin: Denies puritis, rash, hives,  warts, acne, eczema or change in skin lesion Neuro: No weakness, tremor, incoordination, spasms, paresthesia or pain Psychiatric: Denies confusion, memory loss or sensory loss. Denies Depression. Endocrine: Denies change in weight, skin, hair change, nocturia, and paresthesia, diabetic polys, visual blurring or hyper / hypo glycemic episodes.  Heme/Lymph: No excessive bleeding, bruising or enlarged lymph nodes.   Physical Exam  BP 136/82   Pulse (!) 55   Temp 97.9 F (36.6 C)   Resp 16   Ht 5\' 9"  (1.753 m)   Wt 182 lb 9.6 oz (82.8 kg)   SpO2 98%   BMI 26.97  kg/m   General Appearance: Over  nourished  and in no apparent distress.  Eyes:  EOMs, conjunctiva no swelling or erythema. Pupils are pinpoint & fundi not well visualized.  Sinuses: No frontal/maxillary tenderness ENT/Mouth: EACs patent / TMs  nl. Nares clear without erythema, swelling, mucoid exudates. Oral hygiene is good. No erythema, swelling, or exudate. Tongue normal, non-obstructing. Tonsils not swollen or erythematous. Hearing normal.  Neck: Supple, thyroid not palpable. No bruits, nodes or JVD. Respiratory: Respiratory effort normal.  BS equal and clear bilateral without rales, rhonci, wheezing or stridor. Cardio: Heart sounds are normal with regular rate and rhythm and no murmurs, rubs or gallops. Peripheral pulses are normal and equal bilaterally without edema. No aortic or femoral bruits. Chest: symmetric with normal excursions and percussion.  Abdomen: Soft, with Nl bowel sounds. Nontender, no guarding, rebound, hernias, masses, or organomegaly.  Lymphatics: Non tender without lymphadenopathy.  Musculoskeletal: Full ROM all peripheral extremities, joint stability, 5/5 strength, and normal gait. Skin: Warm and dry without rashes, lesions, cyanosis, clubbing or  ecchymosis.  Neuro: Cranial nerves intact, reflexes equal bilaterally. Normal muscle tone, no cerebellar symptoms. Sensation intact to touch, vibratory and  Monofilament to the toes bilaterally. Pysch: Alert and oriented X 3 with normal affect, insight and judgment appropriate.   Assessment and Plan   1. Essential hypertension  - EKG 12-Lead - [place], RETROPERITNL ABD,  LTD - Microalbumin / creatinine urine ratio - CBC with Differential/Platelet - COMPLETE METABOLIC PANEL WITH GFR - Magnesium - TSH  2. Hyperlipidemia associated with type 2 diabetes mellitus (HCC)  - EKG 12-Lead - [place], RETROPERITNL ABD,  LTD - Lipid panel - TSH  3. Type 2 diabetes mellitus with stage 3a chronic kidney  disease, without long-term current use of insulin (HCC)  - EKG 12-Lead - [place], RETROPERITNL ABD,  LTD - HM DIABETES FOOT EXAM - LOW EXTREMITY NEUR EXAM DOCUM - PTH, intact and calcium - Hemoglobin A1c  4. Vitamin D deficiency  - VITAMIN D 25 Hydroxy   5. Diet-controlled diabetes mellitus (HCC)  - Hemoglobin A1c - Insulin, random  6. Secondary hyperparathyroidism of renal origin (HCC)  - PTH, intact and calcium  7. Atherosclerosis of native coronary artery of native heart without angina pectoris  - EKG 12-Lead - [place], RETROPERITNL ABD,  LTD  8. Idiopathic gout  - Uric acid  9. FHx: heart disease  - EKG 12-Lead - [place], RETROPERITNL ABD,  LTD - PSA  10. Former smoker  - EKG 12-Lead - [place], RETROPERITNL ABD,  LTD  11. Screening for ischemic heart disease  - EKG 12-Lead  12. Screening for AAA (aortic abdominal aneurysm)  - [place], RETROPERITNL ABD,  LTD  13. History of prostate cancer  - PSA  14. Screening for colorectal cancer  - POC Hemoccult Bld/Stl   15. Medication management  - Uric acid - PTH, intact and calcium - CBC with Differential/Platelet - COMPLETE METABOLIC PANEL WITH GFR - Magnesium - Lipid panel - TSH       Patient was counseled in prudent diet, weight control to achieve/maintain BMI less than 25, BP monitoring, regular exercise and medications as discussed.  Discussed med effects and SE's. Routine  screening labs and tests as requested with regular follow-up as recommended. Over 40 minutes of exam, counseling, chart review and high complex critical decision making was performed   Marinus Maw, MD

## 2023-06-21 ENCOUNTER — Other Ambulatory Visit: Payer: Self-pay | Admitting: Internal Medicine

## 2023-06-21 DIAGNOSIS — N3001 Acute cystitis with hematuria: Secondary | ICD-10-CM

## 2023-06-21 NOTE — Progress Notes (Signed)
^<^<^<^<^<^<^<^<^<^<^<^<^<^<^<^<^<^<^<^<^<^<^<^<^<^<^<^<^<^<^<^<^<^<^<^<^ ^>^>^>^>^>^>^>^>^>^>^>>^>^>^>^>^>^>^>^>^>^>^>^>^>^>^>^>^>^>^>^>^>^>^>^>^>  -   U/A is suspicious for a UTI , So requested lab to do a Urine culture   ^<^<^<^<^<^<^<^<^<^<^<^<^<^<^<^<^<^<^<^<^<^<^<^<^<^<^<^<^<^<^<^<^<^<^<^<^ ^>^>^>^>^>^>^>^>^>^>^>^>^>^>^>^>^>^>^>^>^>^>^>^>^>^>^>^>^>^>^>^>^>^>^>^>^   - Kidney functions look  dehydrated   -  Very important to drink adequate amounts of fluids to prevent permanent damage    - Recommend drink at least 6 bottles (16 ounces) of fluids /water /day = 96 Oz ~100 oz  - 100 oz = 3,000 cc or 3 liters / day  - >> That's 1 &1/2 bottles of a 2 liter soda bottle /day !   ^>^>^>^>^>^>^>^>^>^>^>^>^>^>^>^>^>^>^>^>^>^>^>^>^>^>^>^>^>^>^>^>^>^>^>^>^ ^>^>^>^>^>^>^>^>^>^>^>^>^>^>^>^>^>^>^>^>^>^>^>^>^>^>^>^>^>^>^>^>^>^>^>^>^  -  PSA is undetectable  - Great  !  ^>^>^>^>^>^>^>^>^>^>^>^>^>^>^>^>^>^>^>^>^>^>^>^>^>^>^>^>^>^>^>^>^>^>^>^>^ ^>^>^>^>^>^>^>^>^>^>^>^>^>^>^>^>^>^>^>^>^>^>^>^>^>^>^>^>^>^>^>^>^>^>^>^>^  -  Chol = 129  - is   Excellent   - Very low risk for Heart Attack  / Stroke  ^>^>^>^>^>^>^>^>^>^>^>^>^>^>^>^>^>^>^>^>^>^>^>^>^>^>^>^>^>^>^>^>^>^>^>^>^ ^>^>^>^>^>^>^>^>^>^>^>^>^>^>^>^>^>^>^>^>^>^>^>^>^>^>^>^>^>^>^>^>^>^>^>^>^  -   Vitamin  D =- OK - Keep dosage same   ^>^>^>^>^>^>^>^>^>^>^>^>^>^>^>^>^>^>^>^>^>^>^>^>^>^>^>^>^>^>^>^>^>^>^>^>^ ^>^>^>^>^>^>^>^>^>^>^>^>^>^>^>^>^>^>^>^>^>^>^>^>^>^>^>^>^>^>^>^>^>^>^>^>^  - All Else - CBC - Kidneys - Electrolytes - Liver - Magnesium & Thyroid    - all  Normal / OK ^>^>^>^>^>^>^>^>^>^>^>^>^>^>^>^>^>^>^>^>^>^>^>^>^>^>^>^>^>^>^>^>^>^>^>^>^ ^>^>^>^>^>^>^>^>^>^>^>^>^>^>^>^>^>^>^>^>^>^>^>^>^>^>^>^>^>^>^>^>^>^>^>^>^

## 2023-06-26 ENCOUNTER — Other Ambulatory Visit: Payer: Self-pay | Admitting: Internal Medicine

## 2023-06-26 DIAGNOSIS — N390 Urinary tract infection, site not specified: Secondary | ICD-10-CM

## 2023-06-26 MED ORDER — SULFAMETHOXAZOLE-TRIMETHOPRIM 800-160 MG PO TABS: ORAL_TABLET | ORAL | 0 refills | Status: DC

## 2023-06-26 NOTE — Progress Notes (Signed)
^<^<^<^<^<^<^<^<^<^<^<^<^<^<^<^<^<^<^<^<^<^<^<^<^<^<^<^<^<^<^<^<^<^<^<^<^ ^>^>^>^>^>^>^>^>^>^>^>>^>^>^>^>^>^>^>^>^>^>^>^>^>^>^>^>^>^>^>^>^>^>^>^>^>  -    A1c finally returned & is elevated  too high -  6.1%  -  It is very important that you work harder with diet by                                    avoiding all foods that are white except chicken, fish & calliflower.  - Avoid white rice  (brown & wild rice is OK),   - Avoid white potatoes  (sweet potatoes in moderation is OK),   White bread or wheat bread or anything made out of   white flour like bagels, donuts, rolls, buns, biscuits, cakes,  - pastries, cookies, pizza crust, and pasta (made from  white flour & egg whites)   - vegetarian pasta or spinach or wheat pasta is OK.  - Multigrain breads like Arnold's, [employer] or   multigrain sandwich thins or high fiber breads like                           [place] bread or "Dave's Killer" breads that are                                                       4 to 5 grams fiber per slice !  are best.    Diet, exercise and weight loss can reverse and cure diabetes in the early stages.    - Diet, exercise and weight loss is very important in the   control and prevention of complications of diabetes which  affects every system in your body  ^<^<^<^<^<^<^<^<^<^<^<^<^<^<^<^<^<^<^<^<^<^<^<^<^<^<^<^<^<^<^<^<^<^<^<^<^ ^>^>^>^>^>^>^>^>^>^>^>^>^>^>^>^>^>^>^>^>^>^>^>^>^>^>^>^>^>^>^>^>^>^>^>^>^

## 2023-06-26 NOTE — Progress Notes (Signed)
^<^<^<^<^<^<^<^<^<^<^<^<^<^<^<^<^<^<^<^<^<^<^<^<^<^<^<^<^<^<^<^<^<^<^<^<^ ^>^>^>^>^>^>^>^>^>^>^>>^>^>^>^>^>^>^>^>^>^>^>^>^>^>^>^>^>^>^>^>^>^>^>^>^>  -    Urine C/S is (+)   - Rx for sulfa drug 2 x /day for 3 weeks    sent to [facility]   - Please schedule NV for U/A & C/S in 1 month   ^<^<^<^<^<^<^<^<^<^<^<^<^<^<^<^<^<^<^<^<^<^<^<^<^<^<^<^<^<^<^<^<^<^<^<^<^ ^>^>^>^>^>^>^>^>^>^>^>^>^>^>^>^>^>^>^>^>^>^>^>^>^>^>^>^>^>^>^>^>^>^>^>^>^  -

## 2023-06-28 NOTE — Progress Notes (Signed)
Patient is aware of urine culture results and states that he will pick up his antibiotic from the pharmacy this afternoon. NV has been scheduled for 1 month from now to re-check his urine. -e welch

## 2023-06-28 NOTE — Progress Notes (Signed)
Patient is aware of lab results and instructions. -e welch

## 2023-07-22 ENCOUNTER — Encounter: Payer: Self-pay | Admitting: Internal Medicine

## 2023-07-27 ENCOUNTER — Ambulatory Visit: Payer: Medicare Other

## 2023-07-28 ENCOUNTER — Ambulatory Visit: Payer: Medicare Other

## 2023-09-21 ENCOUNTER — Telehealth: Payer: Self-pay

## 2023-09-21 ENCOUNTER — Ambulatory Visit (INDEPENDENT_AMBULATORY_CARE_PROVIDER_SITE_OTHER): Payer: Medicare Other | Admitting: Nurse Practitioner

## 2023-09-21 ENCOUNTER — Encounter: Payer: Self-pay | Admitting: Nurse Practitioner

## 2023-09-21 VITALS — BP 168/82 | HR 53 | Temp 98.2°F | Ht 69.0 in | Wt 186.0 lb

## 2023-09-21 DIAGNOSIS — F4310 Post-traumatic stress disorder, unspecified: Secondary | ICD-10-CM

## 2023-09-21 DIAGNOSIS — I2510 Atherosclerotic heart disease of native coronary artery without angina pectoris: Secondary | ICD-10-CM | POA: Diagnosis not present

## 2023-09-21 DIAGNOSIS — C61 Malignant neoplasm of prostate: Secondary | ICD-10-CM

## 2023-09-21 DIAGNOSIS — E119 Type 2 diabetes mellitus without complications: Secondary | ICD-10-CM | POA: Diagnosis not present

## 2023-09-21 DIAGNOSIS — D126 Benign neoplasm of colon, unspecified: Secondary | ICD-10-CM | POA: Diagnosis not present

## 2023-09-21 DIAGNOSIS — K520 Gastroenteritis and colitis due to radiation: Secondary | ICD-10-CM

## 2023-09-21 DIAGNOSIS — G4700 Insomnia, unspecified: Secondary | ICD-10-CM

## 2023-09-21 DIAGNOSIS — Z79899 Other long term (current) drug therapy: Secondary | ICD-10-CM

## 2023-09-21 DIAGNOSIS — I10 Essential (primary) hypertension: Secondary | ICD-10-CM | POA: Diagnosis not present

## 2023-09-21 DIAGNOSIS — M1711 Unilateral primary osteoarthritis, right knee: Secondary | ICD-10-CM

## 2023-09-21 DIAGNOSIS — M1000 Idiopathic gout, unspecified site: Secondary | ICD-10-CM

## 2023-09-21 DIAGNOSIS — Z6825 Body mass index (BMI) 25.0-25.9, adult: Secondary | ICD-10-CM

## 2023-09-21 DIAGNOSIS — E559 Vitamin D deficiency, unspecified: Secondary | ICD-10-CM

## 2023-09-21 DIAGNOSIS — M1712 Unilateral primary osteoarthritis, left knee: Secondary | ICD-10-CM

## 2023-09-21 MED ORDER — ROSUVASTATIN CALCIUM 40 MG PO TABS: ORAL_TABLET | ORAL | 3 refills | Status: DC

## 2023-09-21 NOTE — Patient Instructions (Signed)

## 2023-09-21 NOTE — Telephone Encounter (Signed)
Patient called back after appointment to let you know that his BP was 198/86. Took it at the pharmacy.  Did purchase a machine and will recheck it again and adjust medications accordingly.

## 2023-09-21 NOTE — Progress Notes (Signed)
FOLLOW UP Assessment:   Essential hypertension Elevated in clinic - asymptomatic Discussed importance of taking medications as directed - could be due to noncompliance and only taking Amlodipine. Instructed to purchase in home cuff and call back this afternoon with reading.  If elevated discussed taking x1 Amlodipine 5 mg with possible adjustment to Amlodipine 10 mg daily along with Losartan 100 mg and Propranolol 10 mg every day. Goal 130/80 - last review 05/2023 BP WNL Discussed importance of medication compliance. Discussed DASH (Dietary Approaches to Stop Hypertension) DASH diet is lower in sodium than a typical American diet. Cut back on foods that are high in saturated fat, cholesterol, and trans fats. Eat more whole-grain foods, fish, poultry, and nuts Remain active and exercise as tolerated daily.  Check CMP/CBC  Atherosclerosis of native coronary artery without angina pectoris, unspecified whether native or transplanted heart/Hyperlipidemia Continue Rosuvastatin Discussed lifestyle modifications. Recommended diet heavy in fruits and veggies, omega 3's. Decrease consumption of animal meats, cheeses, and dairy products. Remain active and exercise as tolerated. Continue to monitor. Check lipids/TSH  Diet-controlled diabetes mellitus [facility]) Education: Reviewed 'ABCs' of diabetes management  Discussed goals to be met and/or maintained include A1C (<7) Blood pressure (<130/80) Cholesterol (LDL <70) Continue Eye Exam yearly  Continue Dental Exam Q6 mo Discussed dietary recommendations Discussed Physical Activity recommendations Foot exam UTD Check A1C  Benign neoplasm of colon, unspecified part of colon/Diverticulosis 4/20219 Colonoscopy - recommend 3 year follow up - patient defers at this time.   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 Physical activity. Encouraged/praised to build confidence.  Idiopathic gout, unspecified chronicity, unspecified site Gout- recheck Uric acid as needed Discussed low purine diet Continue to monitor  Insomnia Discussed good sleep hygiene. Establish bed and wake times. Sleep restriction-only sleep estimated hrs sleep. Bed only for sex and sleep, only sleep when sleepy, out of bed if anxious (stimulus control). Reviewed relaxation techniques, mindful meditations. Expected sleep duration. Addressed worries about not sleeping.   Arthritis of both knees Ice/Heat/RICE Method Brace support Continue to monitor  Serum elevated calcium Intact PTH normal 05/2023  Continue to monitor  Orders Placed This Encounter  Procedures   CBC with Differential/Platelet   COMPLETE METABOLIC PANEL WITH GFR   Lipid panel   Hemoglobin A1c   Meds ordered this encounter  Medications   rosuvastatin (CRESTOR) 40 MG tablet    Sig: Take  1 tablet   Daily  for Cholesterol    Dispense:  90 tablet    Refill:  3    Order Specific Question:   Supervising Provider    Answer:   Lucky Cowboy [phone]   Notify office for further evaluation and treatment, questions or concerns if any reported s/s fail to improve.   The patient was advised to call back or seek an in-person evaluation if any symptoms worsen or if the condition fails to improve as anticipated.   Further disposition pending results of labs. Discussed med's effects and SE's.    I discussed the assessment and treatment plan with the patient. The patient was provided an opportunity to ask questions and all were answered. The patient agreed with the plan and demonstrated an  understanding of the  instructions.  Discussed med's effects and SE's. Screening labs and tests as requested with regular follow-up as recommended.  I provided 35 minutes of face-to-face time during this encounter including counseling, chart review, and critical decision making was preformed.  Today's Plan of Care is based on a patient-centered health care approach known as shared decision making - the decisions, tests and treatments allow for patient preferences and values to be balanced with clinical evidence.     Future Appointments  Date Time Provider Department Center  12/22/2023 10:30 AM Lucky Cowboy, MD [facility] None  03/12/2024 11:00 AM Adela Glimpse, NP [facility] None  06/26/2024 10:00 AM Lucky Cowboy, MD [facility] None     Subjective:  [name] Colon is a 82 y.o. male who presents for  3 month follow up for HTN, hyperlipidemia, diet controlled T2DM, and vitamin D Def.   Overall he reports feeling well today.  He has no new concerns at this time.  Has bilateral OA knees. Has mobic that helps some, following with the [place]. Currently doing PT. Has brace/cane but not using.   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  He has insomnia treated by Remus Loffler, no longer taking.  Report he sleeps well with this without notable side effects. He works with [employer] for PTSD.  BMI is Body mass index is 27.47 kg/m., he has been working on diet and exercise. Wt Readings from Last 3 Encounters:  09/21/23 186 lb (84.4 kg)  06/20/23 182 lb 9.6 oz (82.8 kg)  03/02/23 175 lb (79.4 kg)   He is s/p stent in 2009, had normal stress test 02/2016 with Dr. Antoine Poche. Is prescribed nitroglycerine but has never needed.   His blood pressure is elevated in clinic today their BP is BP: (!) 168/82.  Unsure if patient I staking BP medications as directed.  05/2023 BP WNL, however, prior to that  02/2023 elevated and he confirmed due to only taking Amlodipine 5 mg every day.   He does not workout. He denies chest pain, shortness of breath, dizziness, HA, vision changes.  BP Readings from Last 3 Encounters:  09/21/23 (!) 168/82  06/20/23 136/82  03/02/23 (!) 168/80      He is on cholesterol medication (simvastatin 20 mg daily) and denies myalgias. His cholesterol is at goal. The cholesterol last visit was:   Lab Results  Component Value Date   CHOL 139 06/20/2023   HDL 49 06/20/2023   LDLCALC 75 06/20/2023   TRIG 70 06/20/2023   CHOLHDL 2.8 06/20/2023   He has been working on diet and exercise for Diabetes with diabetic chronic kidney disease, has been in and out of DM range x 2003, currently with diet and exercise he is in the preDM range, he is on bASA, he is on ACE/ARB, and denies paresthesia of the feet, polydipsia, polyuria and visual disturbances. Last A1C was:  Lab Results  Component Value Date   HGBA1C 6.1 (H) 06/20/2023   Patient is on Vitamin D supplement.   Lab Results  Component Value Date   VD25OH 52 06/20/2023     He has stable CKD II associated with T2DM monitored via this office:  Lab Results  Component Value Date   GFRAA 49 (L) 05/13/2021   Patient is on allopurinol for gout and does not report a recent flare.  Lab Results  Component Value Date  LABURIC 3.7 (L) 11/25/2022    Medication Review: Current Outpatient Medications on File Prior to Visit  Medication Sig Dispense Refill   amLODipine (NORVASC) 5 MG tablet TAKE 1 TABLET(5 MG) BY MOUTH DAILY 90 tablet 2   Ascorbic Acid (VITAMIN C) 1000 MG tablet Take 1,000 mg by mouth daily.     aspirin 81 MG tablet Take 81 mg by mouth daily.     cetirizine (ZYRTEC) 10 MG tablet Take 10 mg by mouth daily in the afternoon.     diphenhydramine-acetaminophen (TYLENOL PM) 25-500 MG TABS tablet Take 2 tablets by mouth at bedtime as needed (sleep/pain).     docusate sodium (COLACE) 100 MG capsule Take 1 capsule  (100 mg total) by mouth 2 (two) times daily. 60 capsule 2   escitalopram (LEXAPRO) 10 MG tablet Take 10 mg by mouth daily.     glucose blood (ONETOUCH VERIO) test strip 1 each by Other route daily in the afternoon. Use as instructed 100 each 3   ibuprofen (ADVIL) 600 MG tablet Take 1 tablet (600 mg total) by mouth 4 (four) times daily. 120 tablet 1   losartan (COZAAR) 100 MG tablet Take 1 tablet (100 mg total) by mouth daily. 90 tablet 2   LUBRICATING PLUS EYE DROPS 0.5 % SOLN Apply 1 drop to eye 2 (two) times daily as needed (dry eyes).     nitroGLYCERIN (NITROSTAT) 0.4 MG SL tablet Place 0.4 mg under the tongue every 5 (five) minutes as needed for chest pain.     propranolol (INDERAL) 10 MG tablet Take 1 tablet (10 mg total) by mouth daily. 90 tablet 2   pyridOXINE (VITAMIN B-6) 50 MG tablet Take 50 mg by mouth daily.       risperiDONE (RISPERDAL) 2 MG tablet Take 1 mg by mouth at bedtime.     tamsulosin (FLOMAX) 0.4 MG CAPS capsule Take 0.4 mg by mouth at bedtime.     zolpidem (AMBIEN) 10 MG tablet Take 10 mg by mouth at bedtime as needed for sleep.     methocarbamol (ROBAXIN-750) 750 MG tablet Take 1 tablet (750 mg total) by mouth 4 (four) times daily. 120 tablet 1   sulfamethoxazole-trimethoprim (BACTRIM DS) 800-160 MG tablet Take 1 tablet 2 x /day with Meals for Infection 42 tablet 0   traMADol (ULTRAM) 50 MG tablet Take 0.5-1 tablets (25-50 mg total) by mouth every 6 (six) hours as needed for severe pain. 20 tablet 0   No current facility-administered medications on file prior to visit.    Allergies: No Known Allergies  Current Problems (verified) has ADENOCARCINOMA, PROSTATE; COLONIC POLYPS; Hyperlipidemia associated with type 2 diabetes mellitus (HCC); Essential hypertension; Coronary atherosclerosis; RADIATION PROCTITIS; Diverticulosis of colon; Diet-controlled diabetes mellitus (HCC); Medication management; Vitamin D deficiency; PTSD (post-traumatic stress disorder); Obesity (BMI  30.0-34.9); Gout; Chronic kidney disease, stage 3a (HCC); Insomnia; Murmur; Microalbuminuria due to type 2 diabetes mellitus (HCC); Hypercalcemia; Syncope and collapse; Sinus bradycardia; Hyperparathyroidism due to renal insufficiency (HCC); Hyperparathyroidism (HCC); Type 2 diabetes mellitus with stage 3b chronic kidney disease, without long-term current use of insulin (HCC); Osteoarthritis of right knee; and Acute appendicitis on their problem list.  Screening Tests Immunization History  Administered Date(s) Administered   Fluad Quad(high Dose 65+) 09/02/2020, 08/03/2023   Influenza, High Dose Seasonal PF 09/08/2018, 07/24/2019   Influenza-Unspecified 08/22/2014, 09/03/2015, 08/25/2016   Moderna Sars-Covid-2 Vaccination 12/06/2019, 01/03/2020, 09/22/2020   Pfizer Covid-19 Vaccine Bivalent Booster 57yrs & up 03/03/2021, 08/20/2021   Pneumococcal Conjugate-13 09/03/2015  Pneumococcal-Unspecified 08/25/2016   Td 11/22/2006     Patient Care Team: Lucky Cowboy, MD as PCP - General (Internal Medicine) Rollene Rotunda, MD as PCP - Cardiology (Cardiology) Rollene Rotunda, MD as Consulting Physician (Cardiology) Hilarie Fredrickson, MD as Consulting Physician (Gastroenterology) Ihor Gully, MD (Inactive) as Consulting Physician (Urology)  Surgical: He  has a past surgical history that includes Coronary angioplasty with stent (04/2008); Colonoscopy w/ polypectomy; Mass excision (Left, 10/06/2017); Colonoscopy; IR Radiologist Eval & Mgmt (07/22/2021); IR Radiologist Eval & Mgmt (10/20/2021); Total knee arthroplasty (Right, 09/06/2022); and laparoscopic appendectomy (N/A, 12/25/2022). Family His family history includes Heart disease in his father; Hypertension in his father; Hypotension in his mother; Kidney disease in his brother. Social history  He reports that he quit smoking about 30 years ago. His smoking use included cigarettes. He has never used smokeless tobacco. He reports that he does not  drink alcohol and does not use drugs.   Objective:   Today's Vitals   09/21/23 1044  BP: (!) 168/82  Pulse: (!) 53  Temp: 98.2 F (36.8 C)  SpO2: 99%  Weight: 186 lb (84.4 kg)  Height: 5\' 9"  (1.753 m)   Body mass index is 27.47 kg/m.  General appearance: alert, no distress, WD/WN, male HEENT: normocephalic, sclerae anicteric, TMs pearly, nares patent, no discharge or erythema, pharynx normal. Mildly HOH.  Oral cavity: MMM, no lesions Neck: supple, no lymphadenopathy, no thyromegaly, no masses Heart: RRR, normal S1, S2, Mild 2/6 systolic murmur without radiation best heard R 2nd ICS Lungs: CTA bilaterally, + worse RLL but bilateral wheezes, but no rhonchi, or rales Abdomen: +bs, obese,  soft, non tender, non distended, no masses, no hepatomegaly, no splenomegaly Musculoskeletal: nontender, no swelling, no obvious deformity, slow antalgic gait Extremities: 1+ edema, no cyanosis, no clubbing Pulses: 2+ symmetric, upper and lower extremities, normal cap refill Neurological: alert, oriented x 3, CN2-12 intact, strength normal upper extremities and lower extremities, sensation normal throughout, DTRs 2+ throughout, no cerebellar signs, gait slow antalgic Psychiatric: normal affect, behavior normal, pleasant   Thaily Hackworth, NP   09/21/2023

## 2023-09-22 ENCOUNTER — Other Ambulatory Visit: Payer: Self-pay

## 2023-09-22 ENCOUNTER — Emergency Department (HOSPITAL_COMMUNITY): Payer: Medicare Other

## 2023-09-22 ENCOUNTER — Encounter (HOSPITAL_COMMUNITY): Payer: Self-pay

## 2023-09-22 ENCOUNTER — Emergency Department (HOSPITAL_COMMUNITY)
Admission: EM | Admit: 2023-09-22 | Discharge: 2023-09-22 | Disposition: A | Discharge status: Home / Self Care | Payer: Medicare Other

## 2023-09-22 DIAGNOSIS — Z7982 Long term (current) use of aspirin: Secondary | ICD-10-CM | POA: Insufficient documentation

## 2023-09-22 DIAGNOSIS — I10 Essential (primary) hypertension: Secondary | ICD-10-CM | POA: Diagnosis not present

## 2023-09-22 DIAGNOSIS — Z79899 Other long term (current) drug therapy: Secondary | ICD-10-CM | POA: Insufficient documentation

## 2023-09-22 DIAGNOSIS — I159 Secondary hypertension, unspecified: Secondary | ICD-10-CM

## 2023-09-22 DIAGNOSIS — R030 Elevated blood-pressure reading, without diagnosis of hypertension: Secondary | ICD-10-CM | POA: Diagnosis present

## 2023-09-22 LAB — BASIC METABOLIC PANEL
Anion gap: 11 (ref 5–15)
BUN: 13 mg/dL (ref 8–23)
CO2: 22 mmol/L (ref 22–32)
Calcium: 8.8 mg/dL — ABNORMAL LOW (ref 8.9–10.3)
Chloride: 106 mmol/L (ref 98–111)
Creatinine, Ser: 1.67 mg/dL — ABNORMAL HIGH (ref 0.61–1.24)
GFR, Estimated: 41 mL/min — ABNORMAL LOW (ref >60)
Glucose, Bld: 93 mg/dL (ref 70–99)
Potassium: 3.4 mmol/L — ABNORMAL LOW (ref 3.5–5.1)
Sodium: 139 mmol/L (ref 135–145)

## 2023-09-22 LAB — CBC WITH DIFFERENTIAL/PLATELET
Absolute Lymphocytes: 1882 {cells}/uL (ref 850–3900)
Absolute Monocytes: 451 {cells}/uL (ref 200–950)
Basophils Absolute: 19 {cells}/uL (ref 0–200)
Basophils Relative: 0.4 %
Eosinophils Absolute: 173 {cells}/uL (ref 15–500)
Eosinophils Relative: 3.6 %
HCT: 46 % (ref 38.5–50.0)
Hemoglobin: 14.4 g/dL (ref 13.2–17.1)
MCH: 25.5 pg — ABNORMAL LOW (ref 27.0–33.0)
MCHC: 31.3 g/dL — ABNORMAL LOW (ref 32.0–36.0)
MCV: 81.4 fL (ref 80.0–100.0)
MPV: 10.6 fL (ref 7.5–12.5)
Monocytes Relative: 9.4 %
Neutro Abs: 2275 {cells}/uL (ref 1500–7800)
Neutrophils Relative %: 47.4 %
Platelets: 195 10*3/uL (ref 140–400)
RBC: 5.65 10*6/uL (ref 4.20–5.80)
RDW: 14.3 % (ref 11.0–15.0)
Total Lymphocyte: 39.2 %
WBC: 4.8 10*3/uL (ref 3.8–10.8)

## 2023-09-22 LAB — COMPLETE METABOLIC PANEL WITH GFR
AG Ratio: 1.7 (calc) (ref 1.0–2.5)
ALT: 14 U/L (ref 9–46)
AST: 20 U/L (ref 10–35)
Albumin: 4.5 g/dL (ref 3.6–5.1)
Alkaline phosphatase (APISO): 85 U/L (ref 35–144)
BUN/Creatinine Ratio: 8 (calc) (ref 6–22)
BUN: 14 mg/dL (ref 7–25)
CO2: 29 mmol/L (ref 20–32)
Calcium: 9.2 mg/dL (ref 8.6–10.3)
Chloride: 105 mmol/L (ref 98–110)
Creat: 1.65 mg/dL — ABNORMAL HIGH (ref 0.70–1.22)
Globulin: 2.6 g/dL (ref 1.9–3.7)
Glucose, Bld: 101 mg/dL — ABNORMAL HIGH (ref 65–99)
Potassium: 3.5 mmol/L (ref 3.5–5.3)
Sodium: 142 mmol/L (ref 135–146)
Total Bilirubin: 0.7 mg/dL (ref 0.2–1.2)
Total Protein: 7.1 g/dL (ref 6.1–8.1)
eGFR: 41 mL/min/{1.73_m2} — ABNORMAL LOW (ref >=60)

## 2023-09-22 LAB — HEMOGLOBIN A1C
Hgb A1c MFr Bld: 6.1 %{Hb} — ABNORMAL HIGH (ref <5.7)
Mean Plasma Glucose: 128 mg/dL
eAG (mmol/L): 7.1 mmol/L

## 2023-09-22 LAB — CBC
HCT: 45.1 % (ref 39.0–52.0)
Hemoglobin: 13.8 g/dL (ref 13.0–17.0)
MCH: 25 pg — ABNORMAL LOW (ref 26.0–34.0)
MCHC: 30.6 g/dL (ref 30.0–36.0)
MCV: 81.9 fL (ref 80.0–100.0)
Platelets: 185 10*3/uL (ref 150–400)
RBC: 5.51 MIL/uL (ref 4.22–5.81)
RDW: 15.2 % (ref 11.5–15.5)
WBC: 5 10*3/uL (ref 4.0–10.5)
nRBC: 0 % (ref 0.0–0.2)

## 2023-09-22 LAB — LIPID PANEL
Cholesterol: 128 mg/dL (ref <200)
HDL: 47 mg/dL (ref >=40)
LDL Cholesterol (Calc): 64 mg/dL
Non-HDL Cholesterol (Calc): 81 mg/dL (ref <130)
Total CHOL/HDL Ratio: 2.7 (calc) (ref <5.0)
Triglycerides: 89 mg/dL (ref <150)

## 2023-09-22 LAB — TROPONIN I (HIGH SENSITIVITY): Troponin I (High Sensitivity): 3 ng/L (ref <18)

## 2023-09-22 MED ORDER — AMLODIPINE BESYLATE 5 MG PO TABS
10.0000 mg | ORAL_TABLET | Freq: Once | ORAL | Status: AC
Start: 2023-09-22 — End: 2023-09-22
  Administered 2023-09-22: 10 mg via ORAL
  Filled 2023-09-22: qty 2

## 2023-09-22 MED ORDER — LOSARTAN POTASSIUM 50 MG PO TABS
100.0000 mg | ORAL_TABLET | Freq: Once | ORAL | Status: AC
  Administered 2023-09-22: 100 mg via ORAL
  Filled 2023-09-22: qty 2

## 2023-09-22 NOTE — Discharge Instructions (Addendum)
Please follow-up with your primary doctor regarding your blood pressure.  Turn immediately felt fevers, chills, sudden onset headache, vision changes, facial droop, difficulty finding words, unilateral weakness, chest pain, shortness of breath, abdominal pain, stop urinating or develop any new or worsening symptoms that are concerning to you.

## 2023-09-22 NOTE — Telephone Encounter (Signed)
See lab results.  

## 2023-09-22 NOTE — Telephone Encounter (Signed)
LVM to call back.

## 2023-09-22 NOTE — ED Provider Notes (Signed)
[name] Provider Note   CSN: [id number] Arrival date & time: 09/22/23  1514     History  Chief Complaint  Patient presents with   Hypertension    [name] Colon is a 82 y.o. male.  82 year old male present emergency department with chief complaint of asymptomatic hypertension.  States his blood pressure has been in the 180s.  He went to see his primary doctor who told him to come to the emergency department.  Denies headache, vision changes, chest pain, shortness of breath, abdominal pain.  He is urinating as per usual.  He states he otherwise feels his normal self.    Hypertension       Home Medications Prior to Admission medications   Medication Sig Start Date End Date Taking? Authorizing Provider  amLODipine (NORVASC) 5 MG tablet TAKE 1 TABLET(5 MG) BY MOUTH DAILY 03/02/23   Raynelle Dick, NP  Ascorbic Acid (VITAMIN C) 1000 MG tablet Take 1,000 mg by mouth daily.    [provider]  aspirin 81 MG tablet Take 81 mg by mouth daily.    [provider]  cetirizine (ZYRTEC) 10 MG tablet Take 10 mg by mouth daily in the afternoon.    [provider]  diphenhydramine-acetaminophen (TYLENOL PM) 25-500 MG TABS tablet Take 2 tablets by mouth at bedtime as needed (sleep/pain).    [provider]  docusate sodium (COLACE) 100 MG capsule Take 1 capsule (100 mg total) by mouth 2 (two) times daily. 12/28/22 12/28/23  Diamantina Monks, MD  escitalopram (LEXAPRO) 10 MG tablet Take 10 mg by mouth daily. 03/06/21   [provider]  glucose blood (ONETOUCH VERIO) test strip 1 each by Other route daily in the afternoon. Use as instructed 03/29/22   Colon, Konrad Dolores, MD  ibuprofen (ADVIL) 600 MG tablet Take 1 tablet (600 mg total) by mouth 4 (four) times daily. 12/28/22   Diamantina Monks, MD  losartan (COZAAR) 100 MG tablet Take 1 tablet (100 mg total) by mouth daily. 03/02/23   [name] Glimpse,  NP  LUBRICATING PLUS EYE DROPS 0.5 % SOLN Apply 1 drop to eye 2 (two) times daily as needed (dry eyes). 11/10/20   [provider]  nitroGLYCERIN (NITROSTAT) 0.4 MG SL tablet Place 0.4 mg under the tongue every 5 (five) minutes as needed for chest pain.    [provider]  propranolol (INDERAL) 10 MG tablet Take 1 tablet (10 mg total) by mouth daily. 03/02/23   [name] Glimpse, NP  pyridOXINE (VITAMIN B-6) 50 MG tablet Take 50 mg by mouth daily.      [provider]  risperiDONE (RISPERDAL) 2 MG tablet Take 1 mg by mouth at bedtime.    [provider]  rosuvastatin (CRESTOR) 40 MG tablet Take  1 tablet   Daily  for Cholesterol 09/21/23   [name] Glimpse, NP  tamsulosin (FLOMAX) 0.4 MG CAPS capsule Take 0.4 mg by mouth at bedtime.    [provider]  zolpidem (AMBIEN) 10 MG tablet Take 10 mg by mouth at bedtime as needed for sleep.    [provider]      Allergies    Patient has no known allergies.    Review of Systems   Review of Systems  Physical Exam Updated Vital Signs BP (!) 195/83   Pulse (!) 48   Temp 97.9 F (36.6 C) (Oral)   Resp 18   Ht 5\' 9"  (1.753 m)  Wt 84.4 kg   SpO2 100%   BMI 27.48 kg/m  Physical Exam Vitals and nursing note reviewed.  Constitutional:      General: He is not in acute distress.    Appearance: He is not toxic-appearing.  HENT:     Nose: Nose normal.  Eyes:     Conjunctiva/sclera: Conjunctivae normal.  Cardiovascular:     Rate and Rhythm: Normal rate and regular rhythm.  Pulmonary:     Effort: Pulmonary effort is normal.     Breath sounds: Normal breath sounds.  Abdominal:     General: Abdomen is flat. There is no distension.     Tenderness: There is no abdominal tenderness. There is no guarding or rebound.  Skin:    General: Skin is warm.     Capillary Refill: Capillary refill takes less than 2 seconds.  Neurological:     Mental Status: He is alert and oriented to person, place, and  time.  Psychiatric:        Mood and Affect: Mood normal.        Behavior: Behavior normal.     ED Results / Procedures / Treatments   Labs (all labs ordered are listed, but only abnormal results are displayed) Labs Reviewed  BASIC METABOLIC PANEL - Abnormal; Notable for the following components:      Result Value   Potassium 3.4 (*)    Creatinine, Ser 1.67 (*)    Calcium 8.8 (*)    GFR, Estimated 41 (*)    All other components within normal limits  CBC - Abnormal; Notable for the following components:   MCH 25.0 (*)    All other components within normal limits  TROPONIN I (HIGH SENSITIVITY)  TROPONIN I (HIGH SENSITIVITY)    EKG EKG Interpretation Date/Time:  Thursday September 22 2023 15:29:09 EDT Ventricular Rate:  61 PR Interval:  160 QRS Duration:  90 QT Interval:  516 QTC Calculation: 519 R Axis:   -36  Text Interpretation: Normal sinus rhythm Left axis deviation Left ventricular hypertrophy ( R in aVL , Cornell product , Romhilt-Estes ) Prolonged QT Abnormal ECG When compared with ECG of 24-Dec-2022 15:36, PREVIOUS ECG IS PRESENT Confirmed by Estanislado Pandy [phone]) on 09/22/2023 4:58:35 PM  Radiology DG Chest 2 View  Result Date: 09/22/2023 CLINICAL DATA:  Hypertension. EXAM: CHEST - 2 VIEW COMPARISON:  December 24, 2022. FINDINGS: The heart size and mediastinal contours are within normal limits. Both lungs are clear. The visualized skeletal structures are unremarkable. IMPRESSION: No active cardiopulmonary disease. Electronically Signed   By: Lupita Raider M.D.   On: 09/22/2023 17:19    Procedures Procedures    Medications Ordered in ED Medications  amLODipine (NORVASC) tablet 10 mg (10 mg Oral Given 09/22/23 1738)  losartan (COZAAR) tablet 100 mg (100 mg Oral Given 09/22/23 1737)    ED Course/ Medical Decision Making/ A&P                                 Medical Decision Making Is well-appearing 82 year old male with history of hypertension presents to  the emergency department for hypertension.  Blood pressure elevated 184/80.  Heart rate 48, but appears to be sinus bradycardia with no ST segment changes to indicate ischemia.  Per chart review appears that he does take propranolol which would likely explain his low heart rate.  He is asymptomatic in terms of his hypertension.  Labs without evidence  of endorgan damage.  Troponin negative.  Creatinine normal.  Chest x-ray on my independent interpretation with no pneumonia or focal infiltration or mediastinum.  He has no fever or leukocytosis to suggest systemic infection.  Wife who is bedside reports that his blood pressure has been running elevated for the past year and that primary doctor is working to change his medications.  Patient given extra dose of his home medication.  Discussed the importance of further outpatient follow-up.  Patient and wife agreeable plan.  Given strict return precautions.  Stable for discharge at this time.  Amount and/or Complexity of Data Reviewed Labs: ordered. Radiology: ordered.  Risk Prescription drug management.         Final Clinical Impression(s) / ED Diagnoses Final diagnoses:  None    Rx / DC Orders ED Discharge Orders     None         Coral Spikes, DO 09/22/23 1747

## 2023-09-22 NOTE — ED Triage Notes (Signed)
Pt's wife states pt bp was 188/110 an hour ago at home. Pt denies HA, dizziness or blurred vision.

## 2023-09-26 ENCOUNTER — Ambulatory Visit (INDEPENDENT_AMBULATORY_CARE_PROVIDER_SITE_OTHER): Payer: Medicare Other | Admitting: Nurse Practitioner

## 2023-09-26 ENCOUNTER — Encounter: Payer: Self-pay | Admitting: Nurse Practitioner

## 2023-09-26 VITALS — BP 164/84 | HR 50 | Temp 98.0°F | Ht 69.0 in | Wt 185.4 lb

## 2023-09-26 DIAGNOSIS — Z79899 Other long term (current) drug therapy: Secondary | ICD-10-CM | POA: Diagnosis not present

## 2023-09-26 DIAGNOSIS — Z09 Encounter for follow-up examination after completed treatment for conditions other than malignant neoplasm: Secondary | ICD-10-CM

## 2023-09-26 DIAGNOSIS — I10 Essential (primary) hypertension: Secondary | ICD-10-CM | POA: Diagnosis not present

## 2023-09-26 NOTE — Patient Instructions (Signed)
How to Take Your Blood Pressure Blood pressure measures how strongly your blood is pressing against the walls of your arteries. Arteries are blood vessels that carry blood from your heart throughout your body. You can take your blood pressure at home with a machine. You may need to check your blood pressure at home: To check if you have high blood pressure (hypertension). To check your blood pressure over time. To make sure your blood pressure medicine is working. Supplies needed: Blood pressure machine, or monitor. A chair to sit in. This should be a chair where you can sit upright with your back supported. Do not sit on a soft couch or an armchair. Table or desk. Small notebook. Pencil or pen. How to prepare Avoid these things for 30 minutes before checking your blood pressure: Having drinks with caffeine in them, such as coffee or tea. Drinking alcohol. Eating. Smoking. Exercising. Do these things five minutes before checking your blood pressure: Go to the bathroom and pee (urinate). Sit in a chair. Be quiet. Do not talk. How to take your blood pressure Follow the instructions that came with your machine. If you have a digital blood pressure monitor, these may be the instructions: Sit up straight. Place your feet on the floor. Do not cross your ankles or legs. Rest your left arm at the level of your heart. You may rest it on a table, desk, or chair. Pull up your shirt sleeve. Wrap the blood pressure cuff around the upper part of your left arm. The cuff should be 1 inch (2.5 cm) above your elbow. It is best to wrap the cuff around bare skin. Fit the cuff snugly around your arm, but not too tightly. You should be able to place only one finger between the cuff and your arm. Place the cord so that it rests in the bend of your elbow. Press the power button. Sit quietly while the cuff fills with air and loses air. Write down the numbers on the screen. Wait 2-3 minutes and then repeat  steps 1-10. What do the numbers mean? Two numbers make up your blood pressure. The first number is called systolic pressure. The second is called diastolic pressure. An example of a blood pressure reading is "120 over 80" (or 120/80). If you are an adult and do not have a medical condition, use this guide to find out if your blood pressure is normal: Normal First number: below 120. Second number: below 80. Elevated First number: 120-129. Second number: below 80. Hypertension stage 1 First number: 130-139. Second number: 80-89. Hypertension stage 2 First number: 140 or above. Second number: 90 or above. Your blood pressure is above normal even if only the first or only the second number is above normal. Follow these instructions at home: Medicines Take over-the-counter and prescription medicines only as told by your doctor. Tell your doctor if your medicine is causing side effects. General instructions Check your blood pressure as often as your doctor tells you to. Check your blood pressure at the same time every day. Take your monitor to your next doctor's appointment. Your doctor will: Make sure you are using it correctly. Make sure it is working right. Understand what your blood pressure numbers should be. Keep all follow-up visits. General tips You will need a blood pressure machine or monitor. Your doctor can suggest a monitor. You can buy one at a drugstore or online. When choosing one: Choose one with an arm cuff. Choose one that wraps around your   upper arm. Only one finger should fit between your arm and the cuff. Do not choose one that measures your blood pressure from your wrist or finger. Where to find more information American Heart Association: www.heart.org Contact a doctor if: Your blood pressure keeps being high. Your blood pressure is suddenly low. Get help right away if: Your first blood pressure number is higher than 180. Your second blood pressure number is  higher than 120. These symptoms may be an emergency. Do not wait to see if the symptoms will go away. Get help right away. Call 911. Summary Check your blood pressure at the same time every day. Avoid caffeine, alcohol, smoking, and exercise for 30 minutes before checking your blood pressure. Make sure you understand what your blood pressure numbers should be. This information is not intended to replace advice given to you by your health care provider. Make sure you discuss any questions you have with your health care provider. Document Revised: 07/23/2021 Document Reviewed: 07/23/2021 Elsevier Patient Education  2024 Elsevier Inc.  

## 2023-09-26 NOTE — Progress Notes (Signed)
Hospital follow up  Assessment and Plan: Hospital visit follow up for:   Hospital discharge follow-up Reviewed discharge instructions in full including medication changes, diagnostics, labs, and future follow ups appointment. All questions and concerns addressed.   Essential hypertension Increase Amlodipine to 10 mg daily Continue Losartan 100 mg Continue Propranolol 10 mg Discussed how to take BP In the home - Directions printed on AVS. Discussed DASH (Dietary Approaches to Stop Hypertension) DASH diet is lower in sodium than a typical American diet. Cut back on foods that are high in saturated fat, cholesterol, and trans fats. Eat more whole-grain foods, fish, poultry, and nuts Remain active and exercise as tolerated daily.  Monitor BP at home-Call if greater than 130/80.  Check CMP/CBC  Medication management All medications discussed and reviewed in full. All questions and concerns regarding medications addressed.      All medications were reviewed with patient and fully reconciled. All questions answered fully, and patient and family members were encouraged to call the office with any further questions or concerns. Discussed goal to avoid readmission related to this diagnosis.   Over 30 minutes of exam, counseling, chart review, and complex, high/moderate level critical decision making was performed this visit.   Future Appointments  Date Time Provider Department Center  12/22/2023 10:30 AM Lucky Cowboy, MD [facility] None  03/12/2024 11:00 AM Adela Glimpse, NP [facility] None  06/26/2024 10:00 AM Lucky Cowboy, MD [facility] None     HPI 82 y.o.male presents for follow up for transition from recent hospitalization ER visit. Admit date to the hospital was 09/22/23, patient was discharged from the hospital on 09/22/23 and our clinical staff contacted the office the day after discharge to set up a follow up appointment. The discharge summary, medications, and  diagnostic test results were reviewed before meeting with the patient. The patient was admitted for elevated BP after being seen for general follow up with PCP and noted to have persistent elevation throughout the day after taking BP medications:   During the PCP visit he was asked to continue taking BP medications as directed and to purchase a OTC BP cuff to continue taking BP in home for adequate review of medication effectiveness.  Patient contacted office around closing time on 09/22/23 to report that BP continued to remain elevated at home, systocially in the 180's. He reported taking his BP at the pharmacy and did purchase BP cuff but did not use to evaluate the overall BP readings.  Upon his visit to the ER his BP was 195/83.   CXR was negative.  His discharge recommended he double up on his BP medications however, he has not.  He has continued to take Amlodipine 5 mg every day, Losartan 100 mg every day and Propranolol 10 mg every day.  Overall he continues to feel well.  He has not been checking his BP in the home.  His BP continues to be elevated in clinic.  He denies CP, heart palpitations, difficulty breathing, SOB, syncope, HA, vision changes.   Home health is not involved.   Images while in the hospital: DG Chest 2 View  Result Date: 09/22/2023 CLINICAL DATA:  Hypertension. EXAM: CHEST - 2 VIEW COMPARISON:  December 24, 2022. FINDINGS: The heart size and mediastinal contours are within normal limits. Both lungs are clear. The visualized skeletal structures are unremarkable. IMPRESSION: No active cardiopulmonary disease. Electronically Signed   By: Lupita Raider M.D.   On: 09/22/2023 17:19      Current Outpatient  Medications (Cardiovascular):    amLODipine (NORVASC) 5 MG tablet, TAKE 1 TABLET(5 MG) BY MOUTH DAILY   losartan (COZAAR) 100 MG tablet, Take 1 tablet (100 mg total) by mouth daily.   nitroGLYCERIN (NITROSTAT) 0.4 MG SL tablet, Place 0.4 mg under the tongue every 5 (five)  minutes as needed for chest pain.   propranolol (INDERAL) 10 MG tablet, Take 1 tablet (10 mg total) by mouth daily.   rosuvastatin (CRESTOR) 40 MG tablet, Take  1 tablet   Daily  for Cholesterol  Current Outpatient Medications (Respiratory):    cetirizine (ZYRTEC) 10 MG tablet, Take 10 mg by mouth daily in the afternoon.  Current Outpatient Medications (Analgesics):    aspirin 81 MG tablet, Take 81 mg by mouth daily.   ibuprofen (ADVIL) 600 MG tablet, Take 1 tablet (600 mg total) by mouth 4 (four) times daily. (Patient not taking: Reported on 09/26/2023)   Current Outpatient Medications (Other):    Ascorbic Acid (VITAMIN C) 1000 MG tablet, Take 1,000 mg by mouth daily.   diphenhydramine-acetaminophen (TYLENOL PM) 25-500 MG TABS tablet, Take 2 tablets by mouth at bedtime as needed (sleep/pain).   docusate sodium (COLACE) 100 MG capsule, Take 1 capsule (100 mg total) by mouth 2 (two) times daily.   escitalopram (LEXAPRO) 10 MG tablet, Take 10 mg by mouth daily.   glucose blood (ONETOUCH VERIO) test strip, 1 each by Other route daily in the afternoon. Use as instructed   LUBRICATING PLUS EYE DROPS 0.5 % SOLN, Apply 1 drop to eye 2 (two) times daily as needed (dry eyes).   pyridOXINE (VITAMIN B-6) 50 MG tablet, Take 50 mg by mouth daily.     risperiDONE (RISPERDAL) 2 MG tablet, Take 1 mg by mouth at bedtime.   tamsulosin (FLOMAX) 0.4 MG CAPS capsule, Take 0.4 mg by mouth at bedtime.   zolpidem (AMBIEN) 10 MG tablet, Take 10 mg by mouth at bedtime as needed for sleep.  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   Physical Exam: Filed Weights   09/26/23 1027  Weight: 185 lb 6.4 oz (84.1 kg)   BP (!) 164/84   Pulse (!) 50   Temp 98 F (36.7 C)   Ht 5\' 9"  (1.753 m)   Wt 185 lb 6.4 oz (84.1 kg)   SpO2 99%   BMI 27.38 kg/m  General Appearance: Well nourished, in no apparent distress. Eyes: PERRLA, EOMs, conjunctiva no swelling or erythema Sinuses: No Frontal/maxillary tenderness ENT/Mouth: Ext aud canals clear, TMs without erythema, bulging. No erythema, swelling, or exudate on post pharynx.  Tonsils not swollen or erythematous. Hearing normal.  Neck: Supple, thyroid normal.  Respiratory: Respiratory effort normal, BS equal bilaterally without rales, rhonchi, wheezing or stridor.  Cardio: RRR with no MRGs. Brisk peripheral pulses without edema.  Abdomen: Soft, + BS.  Non tender, no guarding, rebound, hernias, masses. Lymphatics: Non tender without lymphadenopathy.  Musculoskeletal: Full ROM, 5/5 strength, normal gait.  Skin: Warm, dry without rashes, lesions, ecchymosis.  Neuro: Cranial nerves intact. Normal muscle tone, no cerebellar symptoms. Sensation intact.  Psych: Awake and oriented X 3, normal affect, Insight and Judgment appropriate.     Adela Glimpse, NP 11:06 AM [facility]

## 2023-11-08 ENCOUNTER — Other Ambulatory Visit: Payer: Self-pay | Admitting: Nurse Practitioner

## 2023-11-09 ENCOUNTER — Telehealth: Payer: Self-pay | Admitting: Nurse Practitioner

## 2023-11-09 ENCOUNTER — Other Ambulatory Visit: Payer: Self-pay | Admitting: Nurse Practitioner

## 2023-11-09 MED ORDER — AMLODIPINE BESYLATE 5 MG PO TABS: ORAL_TABLET | ORAL | 2 refills | Status: DC

## 2023-11-09 NOTE — Telephone Encounter (Signed)
I believe you saw this patient 

## 2023-11-09 NOTE — Telephone Encounter (Signed)
Patient said you changed his amLODipine from taking 1 pill to now 2 pills. If you could change the instructions so insurance will cover.

## 2023-12-22 ENCOUNTER — Ambulatory Visit: Payer: Medicare Other | Admitting: Internal Medicine

## 2023-12-23 ENCOUNTER — Other Ambulatory Visit: Payer: Self-pay | Admitting: Nurse Practitioner

## 2024-02-09 ENCOUNTER — Other Ambulatory Visit: Payer: Self-pay

## 2024-02-09 DIAGNOSIS — E213 Hyperparathyroidism, unspecified: Secondary | ICD-10-CM

## 2024-02-14 ENCOUNTER — Other Ambulatory Visit: Payer: Medicare Other

## 2024-02-15 LAB — RENAL FUNCTION PANEL
Albumin: 4.6 g/dL (ref 3.6–5.1)
BUN/Creatinine Ratio: 9 (calc) (ref 6–22)
BUN: 14 mg/dL (ref 7–25)
CO2: 24 mmol/L (ref 20–32)
Calcium: 9.6 mg/dL (ref 8.6–10.3)
Chloride: 104 mmol/L (ref 98–110)
Creat: 1.52 mg/dL — ABNORMAL HIGH (ref 0.70–1.22)
Glucose, Bld: 159 mg/dL — ABNORMAL HIGH (ref 65–99)
Phosphorus: 3.5 mg/dL (ref 2.1–4.3)
Potassium: 4.3 mmol/L (ref 3.5–5.3)
Sodium: 139 mmol/L (ref 135–146)

## 2024-02-15 LAB — PTH, INTACT AND CALCIUM
Calcium: 9.6 mg/dL (ref 8.6–10.3)
PTH: 39 pg/mL (ref 16–77)

## 2024-02-15 LAB — VITAMIN D 25 HYDROXY (VIT D DEFICIENCY, FRACTURES): Vit D, 25-Hydroxy: 46 ng/mL (ref 30–100)

## 2024-02-15 LAB — MAGNESIUM: Magnesium: 2.2 mg/dL (ref 1.5–2.5)

## 2024-02-20 ENCOUNTER — Encounter: Payer: Self-pay | Admitting: "Endocrinology

## 2024-02-20 ENCOUNTER — Ambulatory Visit (INDEPENDENT_AMBULATORY_CARE_PROVIDER_SITE_OTHER): Payer: Medicare Other | Admitting: "Endocrinology

## 2024-02-20 VITALS — BP 122/70 | HR 91 | Ht 69.0 in | Wt 193.0 lb

## 2024-02-20 DIAGNOSIS — I10 Essential (primary) hypertension: Secondary | ICD-10-CM | POA: Diagnosis not present

## 2024-02-20 DIAGNOSIS — Z8639 Personal history of other endocrine, nutritional and metabolic disease: Secondary | ICD-10-CM | POA: Diagnosis not present

## 2024-02-20 MED ORDER — AMLODIPINE BESYLATE 10 MG PO TABS: 10.0000 mg | ORAL_TABLET | Freq: Every day | ORAL | 0 refills | Status: DC

## 2024-02-20 NOTE — Progress Notes (Signed)
 Outpatient Endocrinology Note Altamese Bonner, MD    [name] Colon [birth year] [id number]  Referring Provider: Anthony Sar, MD Primary Care Provider: Lucky Cowboy, MD Reason for consultation: Subjective   Assessment & Plan  Diagnoses and all orders for this visit:  History of hypercalcemia -     Comprehensive metabolic panel with GFR  Hypertension, unspecified type  Other orders -     amLODipine (NORVASC) 10 MG tablet; Take 1 tablet (10 mg total) by mouth daily.   Intermittent hypercalcemia in the past, currently normal Patient has a history of kidney stones x 2 episodes, last about 2015, spontaneously cleared.  01/2024 calcium within normal limits with normal vitamin D and PTH Patient drinks adequate amount of water Encouraged continued hydration Repeat labs in 6 months  History of hypertension On Norvasc 10 mg daily, requesting refill Given patient is in the process of switching PCP, provided 1 month courtesy refill Patient is to get follow-up refills from the PCP  Return in about 6 months (around 08/21/2024) for visit + labs before next visit.   I have reviewed current medications, nurse's notes, allergies, vital signs, past medical and surgical history, family medical history, and social history for this encounter. Counseled patient on symptoms, examination findings, lab findings, imaging results, treatment decisions and monitoring and prognosis. The patient understood the recommendations and agrees with the treatment plan. All questions regarding treatment plan were fully answered.  Altamese Conneaut, MD  02/20/24   History of Present Illness HPI  [name] Colon is a 83 y.o. year old male who presents for evaluation of hypercalcemia.  Patient has a history of kidney stones x 2 episodes, last about 2015, spontaneously cleared.  He  current hematuria No polyuria No nocturia No thirst No Renal dysfunction Yes anorexia No abdominal pain  No heartburn No constipation Yes nausea or vomiting No history of peptic ulcer disease No depression No confusion No excessive fatigue No fracture No osteoporosis No headaches No numbness No tingling No  He takes Calcium No He takes Vitamin D supplements No  He a history of taking chronic lithium No He a recent history of thiazide diuretic intake No  He family history of renal stones/hypercalcemia No a personal history of MEN syndromes/medullary thyroid cancer/ pheochromocytoma No  Physical Exam  BP 122/70   Pulse 91   Ht 5\' 9"  (1.753 m)   Wt 193 lb (87.5 kg)   SpO2 98%   BMI 28.50 kg/m    Constitutional: well developed, well nourished Head: normocephalic, atraumatic Eyes: sclera anicteric, no redness Neck: supple Lungs: normal respiratory effort Neurology: alert and oriented Skin: dry, no appreciable rashes Musculoskeletal: no appreciable defects Psychiatric: normal mood and affect   Current Medications Patient's Medications  New Prescriptions   AMLODIPINE (NORVASC) 10 MG TABLET    Take 1 tablet (10 mg total) by mouth daily.  Previous Medications   ASCORBIC ACID (VITAMIN C) 1000 MG TABLET    Take 1,000 mg by mouth daily.   ASPIRIN 81 MG TABLET    Take 81 mg by mouth daily.   CETIRIZINE (ZYRTEC) 10 MG TABLET    Take 10 mg by mouth daily in the afternoon.   DIPHENHYDRAMINE-ACETAMINOPHEN (TYLENOL PM) 25-500 MG TABS TABLET    Take 2 tablets by mouth at bedtime as needed (sleep/pain).   ESCITALOPRAM (LEXAPRO) 10 MG TABLET    Take 10 mg by mouth daily.   GLUCOSE BLOOD (ONETOUCH VERIO) TEST STRIP    1 each by Other  route daily in the afternoon. Use as instructed   IBUPROFEN (ADVIL) 600 MG TABLET    Take 1 tablet (600 mg total) by mouth 4 (four) times daily.   LOSARTAN (COZAAR) 100 MG TABLET    TAKE 1 TABLET(100 MG) BY MOUTH DAILY   LUBRICATING PLUS EYE DROPS 0.5 % SOLN    Apply 1 drop to eye 2 (two) times daily as needed (dry eyes).   NITROGLYCERIN (NITROSTAT) 0.4 MG  SL TABLET    Place 0.4 mg under the tongue every 5 (five) minutes as needed for chest pain.   PROPRANOLOL (INDERAL) 10 MG TABLET    TAKE 1 TABLET(10 MG) BY MOUTH DAILY   PYRIDOXINE (VITAMIN B-6) 50 MG TABLET    Take 50 mg by mouth daily.     RISPERIDONE (RISPERDAL) 2 MG TABLET    Take 1 mg by mouth at bedtime.   ROSUVASTATIN (CRESTOR) 40 MG TABLET    Take  1 tablet   Daily  for Cholesterol   TAMSULOSIN (FLOMAX) 0.4 MG CAPS CAPSULE    Take 0.4 mg by mouth at bedtime.   ZOLPIDEM (AMBIEN) 10 MG TABLET    Take 10 mg by mouth at bedtime as needed for sleep.  Modified Medications   No medications on file  Discontinued Medications   AMLODIPINE (NORVASC) 5 MG TABLET    Take 2 tablets (10 mg) daily for blood pressure.    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   Arthritis    Axillary abscess    left   CAD (coronary artery disease)    Diverticulitis, colon    History of colonic polyps    HTN (hypertension)    Hyperglycemia    diet controlled   Hyperlipidemia    Pre-diabetes    Radiation proctitis    with chronic bleeding    Past Surgical History Past Surgical History:  Procedure Laterality Date   COLONOSCOPY     COLONOSCOPY W/ POLYPECTOMY     CORONARY ANGIOPLASTY WITH STENT PLACEMENT  04/2008   IR RADIOLOGIST EVAL & MGMT  07/22/2021   IR RADIOLOGIST EVAL & MGMT  10/20/2021   LAPAROSCOPIC APPENDECTOMY N/A 12/25/2022   Procedure: APPENDECTOMY LAPAROSCOPIC AND DRAINAGE OF INTRABDOMINAL ABSCESS;  Surgeon: Andria Meuse, MD;  Location: [facility];  Service: General;  Laterality: N/A;   MASS EXCISION Left 10/06/2017   Procedure: EXCISION LEFT AXILLA CYST;  Surgeon: Harriette Bouillon, MD;  Location: [facility];  Service: General;  Laterality: Left;   TOTAL KNEE ARTHROPLASTY Right 09/06/2022   Procedure: RIGHT TOTAL KNEE ARTHROPLASTY;  Surgeon: Gean Birchwood, MD;   Location: [facility];  Service: Orthopedics;  Laterality: Right;    Family History family history includes Heart disease in his father; Hypertension in his father; Hypotension in his mother; Kidney disease in his brother.  Social History Social History   Socioeconomic History   Marital status: Married    Spouse name: Not on file   Number of children: 2   Years of education: Not on file   Highest education level: Not on file  Occupational History   Occupation: retired    Associate Professor: Retired  Tobacco Use   Smoking status: Former    Current packs/day: 0.00    Types: Cigarettes    Quit date: 11/22/1992    Years since quitting: 31.2   Smokeless tobacco: Never  Vaping Use  Vaping status: Never Used  Substance and Sexual Activity   Alcohol use: No   Drug use: No   Sexual activity: Not Currently  Other Topics Concern   Not on file  Social History Narrative   Not on file   Social Drivers of Health   Financial Resource Strain: Not on file  Food Insecurity: No Food Insecurity (12/25/2022)   Hunger Vital Sign    Worried About Running Out of Food in the Last Year: Never true    Ran Out of Food in the Last Year: Never true  Transportation Needs: No Transportation Needs (12/25/2022)   PRAPARE - Administrator, Civil Service (Medical): No    Lack of Transportation (Non-Medical): No  Physical Activity: Not on file  Stress: Not on file  Social Connections: Not on file  Intimate Partner Violence: Not At Risk (12/25/2022)   Humiliation, Afraid, Rape, and Kick questionnaire    Fear of Current or Ex-Partner: No    Emotionally Abused: No    Physically Abused: No    Sexually Abused: No    Lab Results  Component Value Date   CHOL 128 09/21/2023   Lab Results  Component Value Date   HDL 47 09/21/2023   Lab Results  Component Value Date   LDLCALC 64 09/21/2023   Lab Results  Component Value Date   TRIG 89 09/21/2023   Lab Results  Component Value Date   CHOLHDL 2.7  09/21/2023   Lab Results  Component Value Date   CREATININE 1.52 (H) 02/14/2024   No results found for: "GFR"    Component Value Date/Time   NA 139 02/14/2024 1002   NA 139 04/06/2021 1121   K 4.3 02/14/2024 1002   CL 104 02/14/2024 1002   CO2 24 02/14/2024 1002   GLUCOSE 159 (H) 02/14/2024 1002   BUN 14 02/14/2024 1002   BUN 18 04/06/2021 1121   CREATININE 1.52 (H) 02/14/2024 1002   CALCIUM 9.6 02/14/2024 1002   CALCIUM 9.6 02/14/2024 1002   PROT 7.1 09/21/2023 1151   PROT 7.2 01/02/2021 0934   ALBUMIN 2.7 (L) 12/26/2022 0807   ALBUMIN 4.7 01/02/2021 0934   AST 20 09/21/2023 1151   ALT 14 09/21/2023 1151   ALKPHOS 59 12/26/2022 0807   BILITOT 0.7 09/21/2023 1151   BILITOT 0.4 01/02/2021 0934   GFRNONAA 41 (L) 09/22/2023 1540   GFRNONAA 42 (L) 05/13/2021 0959   GFRAA 49 (L) 05/13/2021 0959      Latest Ref Rng & Units 02/14/2024   10:02 AM 09/22/2023    3:40 PM 09/21/2023   11:51 AM  BMP  Glucose 65 - 99 mg/dL 301  93  601   BUN 7 - 25 mg/dL 14  13  14    Creatinine 0.70 - 1.22 mg/dL 0.93  2.35  5.73   BUN/Creat Ratio 6 - 22 (calc) 9   8   Sodium 135 - 146 mmol/L 139  139  142   Potassium 3.5 - 5.3 mmol/L 4.3  3.4  3.5   Chloride 98 - 110 mmol/L 104  106  105   CO2 20 - 32 mmol/L 24  22  29    Calcium 8.6 - 10.3 mg/dL 8.6 - 22.0 mg/dL 9.6    9.6  8.8  9.2        Component Value Date/Time   WBC 5.0 09/22/2023 1540   RBC 5.51 09/22/2023 1540   HGB 13.8 09/22/2023 1540   HCT 45.1 09/22/2023 1540  PLT 185 09/22/2023 1540   MCV 81.9 09/22/2023 1540   MCH 25.0 (L) 09/22/2023 1540   MCHC 30.6 09/22/2023 1540   RDW 15.2 09/22/2023 1540   LYMPHSABS 1,923 06/20/2023 1114   MONOABS 1.0 12/27/2022 0058   EOSABS 173 09/21/2023 1151   BASOSABS 19 09/21/2023 1151   Lab Results  Component Value Date   TSH 3.09 06/20/2023   TSH 2.22 11/25/2022   TSH 1.91 05/13/2022         Parts of this note may have been dictated using voice recognition software. There may be  variances in spelling and vocabulary which are unintentional. Not all errors are proofread. Please notify the There[facility] Parkin if any discrepancies are noted or if the meaning of any statement is not clear.

## 2024-03-12 ENCOUNTER — Ambulatory Visit (INDEPENDENT_AMBULATORY_CARE_PROVIDER_SITE_OTHER): Admitting: Nurse Practitioner

## 2024-03-12 ENCOUNTER — Ambulatory Visit: Payer: Medicare Other | Admitting: Nurse Practitioner

## 2024-03-12 ENCOUNTER — Encounter: Payer: Self-pay | Admitting: Nurse Practitioner

## 2024-03-12 VITALS — BP 138/84 | HR 56 | Temp 98.0°F | Ht 68.5 in | Wt 189.0 lb

## 2024-03-12 DIAGNOSIS — Z8546 Personal history of malignant neoplasm of prostate: Secondary | ICD-10-CM

## 2024-03-12 DIAGNOSIS — I10 Essential (primary) hypertension: Secondary | ICD-10-CM | POA: Diagnosis not present

## 2024-03-12 DIAGNOSIS — E1129 Type 2 diabetes mellitus with other diabetic kidney complication: Secondary | ICD-10-CM | POA: Diagnosis not present

## 2024-03-12 DIAGNOSIS — Z125 Encounter for screening for malignant neoplasm of prostate: Secondary | ICD-10-CM

## 2024-03-12 DIAGNOSIS — R809 Proteinuria, unspecified: Secondary | ICD-10-CM

## 2024-03-12 DIAGNOSIS — I2510 Atherosclerotic heart disease of native coronary artery without angina pectoris: Secondary | ICD-10-CM | POA: Diagnosis not present

## 2024-03-12 DIAGNOSIS — E559 Vitamin D deficiency, unspecified: Secondary | ICD-10-CM

## 2024-03-12 DIAGNOSIS — E785 Hyperlipidemia, unspecified: Secondary | ICD-10-CM | POA: Diagnosis not present

## 2024-03-12 DIAGNOSIS — R059 Cough, unspecified: Secondary | ICD-10-CM | POA: Insufficient documentation

## 2024-03-12 DIAGNOSIS — E119 Type 2 diabetes mellitus without complications: Secondary | ICD-10-CM

## 2024-03-12 DIAGNOSIS — E1169 Type 2 diabetes mellitus with other specified complication: Secondary | ICD-10-CM | POA: Diagnosis not present

## 2024-03-12 DIAGNOSIS — R058 Other specified cough: Secondary | ICD-10-CM | POA: Diagnosis not present

## 2024-03-12 DIAGNOSIS — R601 Generalized edema: Secondary | ICD-10-CM | POA: Insufficient documentation

## 2024-03-12 DIAGNOSIS — Z87891 Personal history of nicotine dependence: Secondary | ICD-10-CM

## 2024-03-12 DIAGNOSIS — G4700 Insomnia, unspecified: Secondary | ICD-10-CM | POA: Diagnosis not present

## 2024-03-12 LAB — COMPREHENSIVE METABOLIC PANEL WITH GFR
ALT: 14 U/L (ref 0–53)
AST: 22 U/L (ref 0–37)
Albumin: 4.9 g/dL (ref 3.5–5.2)
Alkaline Phosphatase: 76 U/L (ref 39–117)
BUN: 18 mg/dL (ref 6–23)
CO2: 26 meq/L (ref 19–32)
Calcium: 8.9 mg/dL (ref 8.4–10.5)
Chloride: 104 meq/L (ref 96–112)
Creatinine, Ser: 1.5 mg/dL (ref 0.40–1.50)
GFR: 42.8 mL/min — ABNORMAL LOW (ref >60.00)
Glucose, Bld: 110 mg/dL — ABNORMAL HIGH (ref 70–99)
Potassium: 3.7 meq/L (ref 3.5–5.1)
Sodium: 141 meq/L (ref 135–145)
Total Bilirubin: 0.7 mg/dL (ref 0.2–1.2)
Total Protein: 7.5 g/dL (ref 6.0–8.3)

## 2024-03-12 LAB — LIPID PANEL
Cholesterol: 135 mg/dL (ref 0–200)
HDL: 48.5 mg/dL (ref >39.00)
LDL Cholesterol: 78 mg/dL (ref 0–99)
NonHDL: 86.66
Total CHOL/HDL Ratio: 3
Triglycerides: 45 mg/dL (ref 0.0–149.0)
VLDL: 9 mg/dL (ref 0.0–40.0)

## 2024-03-12 LAB — MICROALBUMIN / CREATININE URINE RATIO
Creatinine,U: 66.2 mg/dL
Microalb Creat Ratio: 242.7 mg/g — ABNORMAL HIGH (ref 0.0–30.0)
Microalb, Ur: 16.1 mg/dL — ABNORMAL HIGH (ref 0.0–1.9)

## 2024-03-12 LAB — CBC
HCT: 45 % (ref 39.0–52.0)
Hemoglobin: 14.7 g/dL (ref 13.0–17.0)
MCHC: 32.7 g/dL (ref 30.0–36.0)
MCV: 79.3 fl (ref 78.0–100.0)
Platelets: 190 10*3/uL (ref 150.0–400.0)
RBC: 5.67 Mil/uL (ref 4.22–5.81)
RDW: 15.8 % — ABNORMAL HIGH (ref 11.5–15.5)
WBC: 5.7 10*3/uL (ref 4.0–10.5)

## 2024-03-12 LAB — BRAIN NATRIURETIC PEPTIDE: Pro B Natriuretic peptide (BNP): 61 pg/mL (ref 0.0–100.0)

## 2024-03-12 LAB — URINALYSIS, MICROSCOPIC ONLY

## 2024-03-12 LAB — PSA, MEDICARE: PSA: 0 ng/mL — ABNORMAL LOW (ref 0.10–4.00)

## 2024-03-12 LAB — VITAMIN D 25 HYDROXY (VIT D DEFICIENCY, FRACTURES): VITD: 40.6 ng/mL (ref 30.00–100.00)

## 2024-03-12 LAB — TSH: TSH: 3.55 u[IU]/mL (ref 0.35–5.50)

## 2024-03-12 LAB — HEMOGLOBIN A1C: Hgb A1c MFr Bld: 6.2 % (ref 4.6–6.5)

## 2024-03-12 MED ORDER — TAMSULOSIN HCL 0.4 MG PO CAPS: 0.4000 mg | ORAL_CAPSULE | Freq: Every day | ORAL | 1 refills | Status: DC

## 2024-03-12 NOTE — Assessment & Plan Note (Signed)
 Unclear etiology.  Has been going on a couple days no other symptoms.  Query if this is PND versus silent reflux.  Patient already on second-generation antihistamine.

## 2024-03-12 NOTE — Assessment & Plan Note (Signed)
 Patient currently on ARB.  Pending UACR today

## 2024-03-12 NOTE — Assessment & Plan Note (Signed)
 Patient currently maintained on amlodipine  10 mg, propranolol  10 mg, losartan  100 mg daily.  Blood pressure is controlled.  Patient tolerates medication well.  Continue medication as prescribed

## 2024-03-12 NOTE — Assessment & Plan Note (Signed)
History of same.  Pending vitamin D level

## 2024-03-12 NOTE — Assessment & Plan Note (Signed)
 Patient currently maintained on Ambien  10 mg nightly and Risperdal  1 mg nightly.  Patient is tolerating medications well continue medications as prescribed

## 2024-03-12 NOTE — Assessment & Plan Note (Signed)
 Patient currently maintained on rosuvastatin  40 mg daily.  Pending lipid panel continue medication as prescribed

## 2024-03-12 NOTE — Progress Notes (Signed)
 New Patient Office Visit  Subjective    Patient ID: [name] Colon, male    DOB: 07-10-1941  Age: 83 y.o. MRN: [id number]  CC:  Chief Complaint  Patient presents with   Establish Care    Thought he was seeing a Dr. Donnita Gales he is good with having NP as PCP.     HPI [name] Colon presents to establish care  HTN: currenty on amlodipine  10, losartan  100mg , propanolol 10. Does not check blood pressure at home. Denies light headedness or dizziness   Mood: on lexapro  does well. No HI/SI/AVH  RLS: risperidone  1mg  at bed to help him slee  HLD: crestor  40mg   Insomina: ambein 10 mg at bedtime every night   BPH: flomax  0.4mg  daily. States that he will urinate once in the morning. States that he has folmax. Does were breifs for leakage   Colonoscopy: 04/03/019, recall in 3 years if able. Does not want to pursue PSA: hx of prostate CA with seed implantation.   Advanced directive: does not have an advanced directive just and estate will   Headache" states that he is having a headache when he coughs. He has been coughing. States that he does not have trouble with polle, States that he will eat around 11-12 and the cough is afternoon 3-4. He does take zyrtec   Outpatient Encounter Medications as of 03/12/2024  Medication Sig   amLODipine  (NORVASC ) 10 MG tablet Take 1 tablet (10 mg total) by mouth daily.   aspirin  81 MG tablet Take 81 mg by mouth daily.   cetirizine (ZYRTEC) 10 MG tablet Take 10 mg by mouth daily in the afternoon.   escitalopram  (LEXAPRO ) 10 MG tablet Take 10 mg by mouth daily.   ibuprofen  (ADVIL ) 600 MG tablet Take 1 tablet (600 mg total) by mouth 4 (four) times daily.   losartan  (COZAAR ) 100 MG tablet TAKE 1 TABLET(100 MG) BY MOUTH DAILY   nitroGLYCERIN (NITROSTAT) 0.4 MG SL tablet Place 0.4 mg under the tongue every 5 (five) minutes as needed for chest pain.   propranolol  (INDERAL ) 10 MG tablet TAKE 1 TABLET(10 MG) BY MOUTH DAILY   pyridOXINE  (VITAMIN B-6) 50 MG  tablet Take 50 mg by mouth daily.     risperiDONE  (RISPERDAL ) 2 MG tablet Take 1 mg by mouth at bedtime.   rosuvastatin  (CRESTOR ) 40 MG tablet Take  1 tablet   Daily  for Cholesterol   zolpidem  (AMBIEN ) 10 MG tablet Take 10 mg by mouth at bedtime as needed for sleep.   tamsulosin  (FLOMAX ) 0.4 MG CAPS capsule Take 1 capsule (0.4 mg total) by mouth at bedtime.   [DISCONTINUED] Ascorbic Acid (VITAMIN C) 1000 MG tablet Take 1,000 mg by mouth daily.   [DISCONTINUED] diphenhydramine-acetaminophen  (TYLENOL  PM) 25-500 MG TABS tablet Take 2 tablets by mouth at bedtime as needed (sleep/pain).   [DISCONTINUED] glucose blood (ONETOUCH VERIO) test strip 1 each by Other route daily in the afternoon. Use as instructed   [DISCONTINUED] LUBRICATING PLUS EYE DROPS 0.5 % SOLN Apply 1 drop to eye 2 (two) times daily as needed (dry eyes).   [DISCONTINUED] tamsulosin  (FLOMAX ) 0.4 MG CAPS capsule Take 0.4 mg by mouth at bedtime. (Patient not taking: Reported on 03/12/2024)   No facility-administered encounter medications on file as of 03/12/2024.    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   Arthritis    Axillary abscess    left   CAD (  coronary artery disease)    Diverticulitis, colon    History of colonic polyps    HTN (hypertension)    Hyperglycemia    diet controlled   Hyperlipidemia    Pre-diabetes    Radiation proctitis    with chronic bleeding    Past Surgical History:  Procedure Laterality Date   COLONOSCOPY     COLONOSCOPY W/ POLYPECTOMY     CORONARY ANGIOPLASTY WITH STENT PLACEMENT  04/2008   IR RADIOLOGIST EVAL & MGMT  07/22/2021   IR RADIOLOGIST EVAL & MGMT  10/20/2021   LAPAROSCOPIC APPENDECTOMY N/A 12/25/2022   Procedure: APPENDECTOMY LAPAROSCOPIC AND DRAINAGE OF INTRABDOMINAL ABSCESS;  Surgeon: Melvenia Stabs, MD;  Location: [facility];  Service: General;  Laterality: N/A;   MASS EXCISION Left 10/06/2017   Procedure: EXCISION  LEFT AXILLA CYST;  Surgeon: Sim Dryer, MD;  Location: [facility];  Service: General;  Laterality: Left;   TOTAL KNEE ARTHROPLASTY Right 09/06/2022   Procedure: RIGHT TOTAL KNEE ARTHROPLASTY;  Surgeon: Wendolyn Hamburger, MD;  Location: [facility];  Service: Orthopedics;  Laterality: Right;    Family History  Problem Relation Age of Onset   Heart disease Father    Hypertension Father    Hypotension Mother    Kidney disease Brother    Colon cancer Neg Hx     Social History   Socioeconomic History   Marital status: Married    Spouse name: Not on file   Number of children: 2   Years of education: Not on file   Highest education level: Not on file  Occupational History   Occupation: retired    Associate Professor: Retired  Tobacco Use   Smoking status: Former    Current packs/day: 0.00    Types: Cigarettes    Quit date: 11/22/1992    Years since quitting: 31.3   Smokeless tobacco: Never  Vaping Use   Vaping status: Never Used  Substance and Sexual Activity   Alcohol  use: No   Drug use: No   Sexual activity: Not Currently  Other Topics Concern   Not on file  Social History Narrative   Not on file   Social Drivers of Health   Financial Resource Strain: Not on file  Food Insecurity: No Food Insecurity (12/25/2022)   Hunger Vital Sign    Worried About Running Out of Food in the Last Year: Never true    Ran Out of Food in the Last Year: Never true  Transportation Needs: No Transportation Needs (12/25/2022)   PRAPARE - Administrator, Civil Service (Medical): No    Lack of Transportation (Non-Medical): No  Physical Activity: Not on file  Stress: Not on file  Social Connections: Not on file  Intimate Partner Violence: Not At Risk (12/25/2022)   Humiliation, Afraid, Rape, and Kick questionnaire    Fear of Current or Ex-Partner: No    Emotionally Abused: No    Physically Abused: No    Sexually Abused: No    Review of Systems  Constitutional:  Negative for chills  and fever.  Respiratory:  Positive for cough. Negative for shortness of breath.   Cardiovascular:  Negative for chest pain.  Gastrointestinal:        Bm daily to every other day   Genitourinary:  Negative for dysuria and hematuria.  Neurological:  Positive for headaches.  Psychiatric/Behavioral:  Negative for hallucinations and suicidal ideas.         Objective    BP 138/84 (BP Location:  Left Arm, Patient Position: Sitting, Cuff Size: Normal)   Pulse (!) 56   Temp 98 F (36.7 C) (Oral)   Ht 5' 8.5" (1.74 m)   Wt 189 lb (85.7 kg)   SpO2 98%   BMI 28.32 kg/m   Physical Exam Vitals and nursing note reviewed.  Constitutional:      Appearance: Normal appearance.  HENT:     Right Ear: Tympanic membrane, ear canal and external ear normal.     Left Ear: Tympanic membrane, ear canal and external ear normal.     Mouth/Throat:     Mouth: Mucous membranes are moist.     Pharynx: Oropharynx is clear.  Eyes:     Extraocular Movements: Extraocular movements intact.     Pupils: Pupils are equal, round, and reactive to light.  Cardiovascular:     Rate and Rhythm: Normal rate and regular rhythm.     Pulses: Normal pulses.     Heart sounds: Normal heart sounds.  Pulmonary:     Effort: Pulmonary effort is normal.     Breath sounds: Normal breath sounds.  Abdominal:     General: Bowel sounds are normal.  Musculoskeletal:     Right lower leg: Edema present.     Left lower leg: No edema.  Lymphadenopathy:     Cervical: No cervical adenopathy.  Skin:    General: Skin is warm.  Neurological:     General: No focal deficit present.     Mental Status: He is alert.     Comments: Bilateral upper and lower extremity strength 5/5  Psychiatric:        Mood and Affect: Mood normal.        Behavior: Behavior normal.        Thought Content: Thought content normal.        Judgment: Judgment normal.    Title   Diabetic Foot Exam - detailed Is there a history of foot ulcer?: No Is there  a foot ulcer now?: No Is there swelling?: No Is there elevated skin temperature?: No Is there abnormal foot shape?: No Is there a claw toe deformity?: No Are the toenails long?: No Are the toenails thick?: No Pulse Foot Exam completed.: Yes   Right Posterior Tibialis: Present Left posterior Tibialis: Present   Right Dorsalis Pedis: Present Left Dorsalis Pedis: Present     Semmes-Weinstein Monofilament Test "+" means "has sensation" and "-" means "no sensation"      Image components are not supported.   Image components are not supported. Image components are not supported.  Tuning Fork Comments Left side absent 5,6  Right side absent 7  Corn on the left lateral 5th toe        Assessment & Plan:   Problem List Items Addressed This Visit       Cardiovascular and Mediastinum   Essential hypertension - Primary   Patient currently maintained on amlodipine  10 mg, propranolol  10 mg, losartan  100 mg daily.  Blood pressure is controlled.  Patient tolerates medication well.  Continue medication as prescribed      Relevant Orders   CBC   Comprehensive metabolic panel with GFR   TSH   Coronary atherosclerosis   History of the same status post stent currently maintained on rosuvastatin  40 mg daily.        Endocrine   Hyperlipidemia associated with type 2 diabetes mellitus (HCC)   Patient currently maintained on rosuvastatin  40 mg daily.  Pending lipid panel continue medication as  prescribed      Relevant Orders   Lipid panel   Diet-controlled diabetes mellitus (HCC)   Currently maintained on diet alone.  Will check A1c      Relevant Orders   Hemoglobin A1c   Microalbumin / creatinine urine ratio   Microalbuminuria due to type 2 diabetes mellitus (HCC)   Patient currently on ARB.  Pending UACR today      Relevant Orders   Microalbumin / creatinine urine ratio     Other   Vitamin D  deficiency   History of same.  Pending vitamin D  level      Relevant  Orders   VITAMIN D  25 Hydroxy (Vit-D Deficiency, Fractures)   Insomnia   Patient currently maintained on Ambien  10 mg nightly and Risperdal  1 mg nightly.  Patient is tolerating medications well continue medications as prescribed      Relevant Orders   TSH   Former smoker   Relevant Orders   Urine Microscopic   Generalized edema   Patient's left lower leg edematous and right upper extremity edematous.  Patient is experiencing a cough we will check BNP to make sure patient is not fluid overloaded.  Patient states right lower extremity is baseline status post total right knee      Relevant Orders   Brain natriuretic peptide   Cough   Unclear etiology.  Has been going on a couple days no other symptoms.  Query if this is PND versus silent reflux.  Patient already on second-generation antihistamine.      Relevant Orders   Brain natriuretic peptide   Other Visit Diagnoses       History of prostate cancer         Screening for prostate cancer       Relevant Orders   PSA, Medicare       Return in about 3 months (around 06/11/2024) for BP recheck.   Margarie Shay, NP

## 2024-03-12 NOTE — Patient Instructions (Signed)
 Nice to see you today I will be in touch with the labs once I have them Follow up with me in 3 months, sooner if you need me

## 2024-03-12 NOTE — Assessment & Plan Note (Signed)
 History of the same status post stent currently maintained on rosuvastatin  40 mg daily.

## 2024-03-12 NOTE — Assessment & Plan Note (Signed)
 Currently maintained on diet alone.  Will check A1c

## 2024-03-12 NOTE — Assessment & Plan Note (Signed)
 Patient's left lower leg edematous and right upper extremity edematous.  Patient is experiencing a cough we will check BNP to make sure patient is not fluid overloaded.  Patient states right lower extremity is baseline status post total right knee

## 2024-03-14 ENCOUNTER — Telehealth: Payer: Self-pay | Admitting: Nurse Practitioner

## 2024-03-14 DIAGNOSIS — E1129 Type 2 diabetes mellitus with other diabetic kidney complication: Secondary | ICD-10-CM

## 2024-03-14 MED ORDER — EMPAGLIFLOZIN 10 MG PO TABS: 10.0000 mg | ORAL_TABLET | Freq: Every day | ORAL | 1 refills | Status: DC

## 2024-03-14 NOTE — Telephone Encounter (Signed)
-----   Message from [facility] T sent at 03/14/2024  4:17 PM EDT ----- Called patient reviewed all information and repeated back to me. Pt is okay with starting on medication to help with protein in urine.  Scheduled pt with lab appointment in 3 weeks to recheck kidneys. 04/04/24 @1230PM

## 2024-04-04 ENCOUNTER — Other Ambulatory Visit

## 2024-04-23 ENCOUNTER — Other Ambulatory Visit: Payer: Self-pay | Admitting: "Endocrinology

## 2024-04-23 ENCOUNTER — Other Ambulatory Visit: Payer: Self-pay | Admitting: Nurse Practitioner

## 2024-05-14 ENCOUNTER — Telehealth: Payer: Self-pay | Admitting: Nurse Practitioner

## 2024-05-14 NOTE — Telephone Encounter (Signed)
 Form completed for a 5 year tag. I did not do the permanent that he requested

## 2024-05-14 NOTE — Telephone Encounter (Signed)
 Patient dropped off handicapped placard and placed in provider's folder.  I advise patient when forms are ready to be picked up, I will called.

## 2024-05-14 NOTE — Telephone Encounter (Signed)
 Placed form in folder for pcp to sign.

## 2024-05-14 NOTE — Telephone Encounter (Signed)
 Patient verbalized he wants forms today and he will be going out of town tomorrow.

## 2024-05-14 NOTE — Telephone Encounter (Signed)
 Called patient reviewed all information and repeated back to me. Will call if any questions.  He is aware it is for 5 yrs and can be picked up at our office

## 2024-05-28 ENCOUNTER — Other Ambulatory Visit: Payer: Self-pay | Admitting: Nurse Practitioner

## 2024-05-28 DIAGNOSIS — E1129 Type 2 diabetes mellitus with other diabetic kidney complication: Secondary | ICD-10-CM

## 2024-05-29 ENCOUNTER — Ambulatory Visit

## 2024-06-04 ENCOUNTER — Ambulatory Visit (INDEPENDENT_AMBULATORY_CARE_PROVIDER_SITE_OTHER): Admitting: Nurse Practitioner

## 2024-06-04 VITALS — BP 132/60 | HR 50 | Temp 98.1°F | Ht 68.5 in | Wt 188.4 lb

## 2024-06-04 DIAGNOSIS — R809 Proteinuria, unspecified: Secondary | ICD-10-CM | POA: Diagnosis not present

## 2024-06-04 DIAGNOSIS — Z7984 Long term (current) use of oral hypoglycemic drugs: Secondary | ICD-10-CM

## 2024-06-04 DIAGNOSIS — I10 Essential (primary) hypertension: Secondary | ICD-10-CM | POA: Diagnosis not present

## 2024-06-04 DIAGNOSIS — E119 Type 2 diabetes mellitus without complications: Secondary | ICD-10-CM

## 2024-06-04 DIAGNOSIS — E1129 Type 2 diabetes mellitus with other diabetic kidney complication: Secondary | ICD-10-CM

## 2024-06-04 LAB — BASIC METABOLIC PANEL WITH GFR
BUN: 16 mg/dL (ref 6–23)
CO2: 29 meq/L (ref 19–32)
Calcium: 9.3 mg/dL (ref 8.4–10.5)
Chloride: 105 meq/L (ref 96–112)
Creatinine, Ser: 1.68 mg/dL — ABNORMAL HIGH (ref 0.40–1.50)
GFR: 37.3 mL/min — ABNORMAL LOW (ref >60.00)
Glucose, Bld: 96 mg/dL (ref 70–99)
Potassium: 4.2 meq/L (ref 3.5–5.1)
Sodium: 142 meq/L (ref 135–145)

## 2024-06-04 LAB — MICROALBUMIN / CREATININE URINE RATIO
Creatinine,U: 60.1 mg/dL
Microalb Creat Ratio: 202.9 mg/g — ABNORMAL HIGH (ref 0.0–30.0)
Microalb, Ur: 12.2 mg/dL — ABNORMAL HIGH (ref 0.0–1.9)

## 2024-06-04 NOTE — Assessment & Plan Note (Signed)
 Currently on amlodipine  10mg  daily and losartan  100mg  daily. Tolerating medications well and BP controlled. Continue medications as prescribed

## 2024-06-04 NOTE — Patient Instructions (Addendum)
 Nice to see you today Our pharmacist or her team will reach out to you and see if they can help with the cost of the Jardiance  Follow up with me in 6 months, sooner if you need me  Make sure you are getting plenty of water. You can use over the counter mira lax daily to help your bowels move

## 2024-06-04 NOTE — Addendum Note (Signed)
 Addended by: ISADORA RAISIN on: 06/04/2024 10:23 AM   Modules accepted: Orders

## 2024-06-04 NOTE — Assessment & Plan Note (Signed)
 Elevated levels in most recent check. On max tolerated ARB. We did place on jardiance . He went to pick up an additional script and was financially unobtainable. Will recheck UACR to see if helpful and BMP. Referral to [facility]

## 2024-06-04 NOTE — Assessment & Plan Note (Signed)
 Controlled on diet only. Did add on jardiance  for microalbuminuria

## 2024-06-04 NOTE — Progress Notes (Signed)
 Established Patient Office Visit  Subjective   Patient ID: [name] Colon, male    DOB: [birth year]  Age: 83 y.o. MRN: [id number]  Chief Complaint  Patient presents with   Hypertension    HPI   HTN: Patient currently maintained on amlodipine  10 mg daily, losartan  100 mg daily.  At last office visit on 03/12/2024 patient's blood pressure was controlled without checking blood pressures at home.  States that he went to go fill the jardiance  and it was over 500 dollars has  been off for approx 1 week   Cough: Patient endorsed a cough last office visit.  He was on second-generation antihistamine.  Query PND versus silent reflux. He states that he no longer has the cough   Review of Systems  Constitutional:  Negative for chills and fever.  Respiratory:  Negative for shortness of breath.   Cardiovascular:  Negative for chest pain.  Gastrointestinal:  Positive for constipation. Negative for abdominal pain, nausea and vomiting.       Bm every other day   Neurological:  Negative for headaches.  Psychiatric/Behavioral:  Negative for hallucinations and suicidal ideas.       Objective:     BP 132/60   Pulse (!) 50   Temp 98.1 F (36.7 C) (Oral)   Ht 5' 8.5 (1.74 m)   Wt 188 lb 6.4 oz (85.5 kg)   SpO2 96%   BMI 28.23 kg/m  BP Readings from Last 3 Encounters:  06/04/24 132/60  03/12/24 138/84  02/20/24 122/70   Wt Readings from Last 3 Encounters:  06/04/24 188 lb 6.4 oz (85.5 kg)  03/12/24 189 lb (85.7 kg)  02/20/24 193 lb (87.5 kg)   SpO2 Readings from Last 3 Encounters:  06/04/24 96%  03/12/24 98%  02/20/24 98%      Physical Exam Vitals and nursing note reviewed.  Constitutional:      Appearance: Normal appearance.  Cardiovascular:     Rate and Rhythm: Normal rate and regular rhythm.     Heart sounds: Normal heart sounds.  Pulmonary:     Effort: Pulmonary effort is normal.     Breath sounds: Normal breath sounds.  Abdominal:     General: Bowel sounds are  normal.  Neurological:     Mental Status: He is alert.      No results found for any visits on 06/04/24.    The ASCVD Risk score (Arnett DK, et al., 2019) failed to calculate for the following reasons:   The 2019 ASCVD risk score is only valid for ages 37 to 75    Assessment & Plan:   Problem List Items Addressed This Visit       Cardiovascular and Mediastinum   Essential hypertension   Currently on amlodipine  10mg  daily and losartan  100mg  daily. Tolerating medications well and BP controlled. Continue medications as prescribed         Endocrine   Diet-controlled diabetes mellitus (HCC) - Primary   Controlled on diet only. Did add on jardiance  for microalbuminuria       Relevant Orders   Basic metabolic panel with GFR   AMB Referral [facility] Care Management   Microalbuminuria due to type 2 diabetes mellitus (HCC)   Elevated levels in most recent check. On max tolerated ARB. We did place on jardiance . He went to pick up an additional script and was financially unobtainable. Will recheck UACR to see if helpful and BMP. Referral to [facility]      Relevant Orders  Microalbumin / creatinine urine ratio    Return in about 6 months (around 12/05/2024) for DM recheck.    Adina Crandall, NP

## 2024-06-06 ENCOUNTER — Ambulatory Visit: Payer: Self-pay | Admitting: Nurse Practitioner

## 2024-06-06 ENCOUNTER — Telehealth: Payer: Self-pay

## 2024-06-06 NOTE — Progress Notes (Unsigned)
 Care Guide Pharmacy Note  06/06/2024 Name: [name] Colon MRN: [id number] DOB: [birth year]  Referred By: Wendee Lynwood HERO, NP Reason for referral: Complex Care Management and Call Attempt #1 (Initial Outreach to schedule with PHARM D- Manuelita )   [name] Colon is a 83 y.o. year old male who is a primary care patient of Wendee, Lynwood HERO, NP.  [name] Colon was referred to the pharmacist for assistance related to: Medication assistance  An unsuccessful telephone outreach was attempted today to contact the patient who was referred to the pharmacy team for assistance with medication assistance. Additional attempts will be made to contact the patient.   Leotis Rase [facility] Guide  Direct Dial: [phone]  Fax [phone]

## 2024-06-08 NOTE — Progress Notes (Signed)
 Care Guide Pharmacy Note  06/08/2024 Name: CORBY VANDENBERGHE MRN: [id number] DOB: 1941/04/29  Referred By: Wendee Lynwood HERO, NP Reason for referral: Complex Care Management, Call Attempt #1 (Initial Outreach to schedule with PHARM DGLENWOOD Shaver ), and Call Attempt #2 (Initial Outreach scheduled with Shaver Berdine BIRCH)   RAMESH MOAN is a 83 y.o. year old male who is a primary care patient of Wendee, Lynwood HERO, NP.  ARRAN FESSEL was referred to the pharmacist for assistance related to: Medication assistance.  Successful contact was made with the patient to discuss pharmacy services including being ready for the pharmacist to call at least 5 minutes before the scheduled appointment time and to have medication bottles and any blood pressure readings ready for review. The patient agreed to meet with the pharmacist via telephone visit on (date/time). 06/14/24 @ 1 pm.    Leotis Rase [facility], [facility] Guide  Direct Dial: [phone]  Fax [phone]

## 2024-06-11 ENCOUNTER — Ambulatory Visit: Admitting: Nurse Practitioner

## 2024-06-14 ENCOUNTER — Other Ambulatory Visit: Payer: Self-pay | Admitting: Pharmacist

## 2024-06-14 ENCOUNTER — Other Ambulatory Visit

## 2024-06-14 NOTE — Progress Notes (Signed)
 Attempted to contact patient for scheduled appointment for medication management.  Tried both patient and wife's phone in Epic though both have voicemail that are full.  Unable to leave message on either line.    Patient was referred for medication cost concerns.  Income not clear, though if <250 or <300% [employer]PL, may qualify for Jardiance  or [place] patient assistance.

## 2024-06-14 NOTE — Progress Notes (Deleted)
   06/14/2024 Name: [name] Colon MRN: [id number] DOB: 10/12/1941  Subjective  No chief complaint on file.   Care Team: Primary Care Provider: Wendee Lynwood HERO, NP  Reason for visit: ?  [name] Colon is a 83 y.o. male who presents today for a telephone visit with the pharmacist due to medication access concerns regarding their Jardiance . ?   Medication Access: ?  Reports that all medications are not affordable.  Jardiance  ~$500 at the pharmacy.   Prescription drug coverage: YES Payor: MEDICARE / Plan: MEDICARE PART A AND B / Product Type: *No Product type* / .  Summary of Benefit: ***  Current Patient Assistance: {LZ MAP program list:31540}  Patient lives in a household of {NUMBERS:20191} {LZ MAP income choice:31503} via {Source of Income:7478140411}.  Medicare LIS Eligible: {YES/NO:21197} {LZLISELIG2024:31094}   2025 Poverty Guidelines  Family Size  200% 250%  300%  400%  500%   1  $31,300 $39,125  $46,950  $62,600 $78,250  2  $42,300 $52,875 $63,450  $84,600 $105,750  3  $53,300 $66,625 $79,950 $106,600 $133,250  4  64,300 $80,375 $96,450 $128,600 $160,750  Programs BI Cares Inhalers BI Cares [place] BMS GSK Novo[employer] MedicareD: [employer] cholesterol grant   Assessment and Plan:   1. Medication Access {LZ MAP program list:31540} patient portion of application filled out today and uploaded to Media Tab.  Provider page placed in PCP inbox with instruction to fax to MAP program upon signature and to scan into chart.  Patient forms: {LZ MAP income:31500} Income documentation: {LZ MAP income documents:31501} Patient is not eligible for copay cards due to government insurance. Will collaborate with CPhT team to facilitate MAP assistance for *** CPhT Med Access team cc'd for future tracking/correspondences.  Upon completion of signed forms, will fax full application to program and upload in Media Tab. {LZ MAP Fax:31505}.  Future  Appointments  Date Time Provider Department Center  08/16/2024 10:00 AM [facility]NEUR[facility] None  08/21/2024 10:00 AM Dartha Ernst, MD [facility] None    Manuelita FABIENE Kobs, PharmD Clinical Pharmacist [facility] [phone]

## 2024-06-21 ENCOUNTER — Telehealth: Payer: Self-pay

## 2024-06-21 NOTE — Progress Notes (Signed)
 Complex Care Management Note  Care Guide Note 06/21/2024 Name: [name] Colon MRN: [id number] DOB: [birth year]  [name] Colon is a 83 y.o. year old male who sees [name] Colon, [name] HERO, [name] Colon for primary care. I reached out to [name] Colon by phone today to offer complex care management services.  [name] Colon was given information about Complex Care Management services today including:   The Complex Care Management services include support from the care team which includes your Nurse Care Manager, Clinical Social Worker, or Pharmacist.  The Complex Care Management team is here to help remove barriers to the health concerns and goals most important to you. Complex Care Management services are voluntary, and the patient may decline or stop services at any time by request to their care team member.   Complex Care Management Consent Status: Patient did not agree to participate in complex care management services at this time.  Encounter Outcome:  Patient Refused  [name] Colon [facility], [facility] Care Management Assistant Direct Dial: [phone]  Fax: [phone]

## 2024-06-26 ENCOUNTER — Encounter: Payer: Medicare Other | Admitting: Internal Medicine

## 2024-08-07 ENCOUNTER — Other Ambulatory Visit: Payer: Self-pay

## 2024-08-16 ENCOUNTER — Other Ambulatory Visit: Payer: Self-pay | Admitting: "Endocrinology

## 2024-08-16 ENCOUNTER — Other Ambulatory Visit

## 2024-08-16 DIAGNOSIS — Z8639 Personal history of other endocrine, nutritional and metabolic disease: Secondary | ICD-10-CM

## 2024-08-16 LAB — RENAL FUNCTION PANEL
Albumin: 4.5 g/dL (ref 3.6–5.1)
BUN/Creatinine Ratio: 9 (calc) (ref 6–22)
BUN: 15 mg/dL (ref 7–25)
CO2: 25 mmol/L (ref 20–32)
Calcium: 10.3 mg/dL (ref 8.6–10.3)
Chloride: 107 mmol/L (ref 98–110)
Creat: 1.73 mg/dL — ABNORMAL HIGH (ref 0.70–1.22)
Glucose, Bld: 101 mg/dL — ABNORMAL HIGH (ref 65–99)
Phosphorus: 3.5 mg/dL (ref 2.1–4.3)
Potassium: 4.1 mmol/L (ref 3.5–5.3)
Sodium: 140 mmol/L (ref 135–146)

## 2024-08-21 ENCOUNTER — Encounter: Payer: Self-pay | Admitting: "Endocrinology

## 2024-08-21 ENCOUNTER — Ambulatory Visit (INDEPENDENT_AMBULATORY_CARE_PROVIDER_SITE_OTHER): Admitting: "Endocrinology

## 2024-08-21 VITALS — BP 122/80 | HR 84 | Ht 68.5 in | Wt 186.0 lb

## 2024-08-21 DIAGNOSIS — Z8639 Personal history of other endocrine, nutritional and metabolic disease: Secondary | ICD-10-CM

## 2024-08-21 NOTE — Progress Notes (Signed)
 Outpatient Endocrinology Note [name] Birmingham, MD    [name] Colon 1941-05-03 [id number]  Referring Provider: Tonita Fallow, MD Primary Care Provider: Wendee Lynwood HERO, NP Reason for consultation: Subjective   Assessment & Plan  Diagnoses and all orders for this visit:  History of hypercalcemia -     Renal function panel   Intermittent hypercalcemia in the past, currently normal Patient has a history of kidney stones x 2 episodes, last about 2015, spontaneously cleared.  01/2024 calcium  within normal limits with normal vitamin D  and PTH Patient drinks adequate amount of water Encouraged continued hydration Recommend 1000 units of Vit D every or one MVI a day  Current Calcium  is within normal limits  Repeat labs in 6 months:  Return in about 6 months (around 02/18/2025) for visit + labs before next visit.   I have reviewed current medications, nurse's notes, allergies, vital signs, past medical and surgical history, family medical history, and social history for this encounter. Counseled patient on symptoms, examination findings, lab findings, imaging results, treatment decisions and monitoring and prognosis. The patient understood the recommendations and agrees with the treatment plan. All questions regarding treatment plan were fully answered.  [name] Birmingham, MD  08/21/24   History of Present Illness HPI  [name] Colon is a 83 y.o. year old male who presents for follow up of hypercalcemia.  Denies any recent kidney stones Denies abdominal main/nausea/vomiting/GERD Has constipation No falls/fractures Not taking any calcium  or Vit D  Initial history:  Patient has a history of kidney stones x 2 episodes, last about 2015, spontaneously cleared.  He  current hematuria No polyuria No nocturia No thirst No Renal dysfunction Yes anorexia No abdominal pain No heartburn No constipation Yes nausea or vomiting No history of peptic ulcer disease  No depression No confusion No excessive fatigue No fracture No osteoporosis No headaches No numbness No tingling No  He takes Calcium  No He takes Vitamin D  supplements No  He a history of taking chronic lithium No He a recent history of thiazide diuretic intake No  He family history of renal stones/hypercalcemia No a personal history of MEN syndromes/medullary thyroid  cancer/ pheochromocytoma No  Physical Exam  BP 122/80   Pulse 84   Ht 5' 8.5 (1.74 m)   Wt 186 lb (84.4 kg)   SpO2 98%   BMI 27.87 kg/m    Constitutional: well developed, well nourished Head: normocephalic, atraumatic Eyes: sclera anicteric, no redness Neck: supple Lungs: normal respiratory effort Neurology: alert and oriented Skin: dry, no appreciable rashes Musculoskeletal: no appreciable defects Psychiatric: normal mood and affect   Current Medications Patient's Medications  New Prescriptions   No medications on file  Previous Medications   AMLODIPINE  (NORVASC ) 10 MG TABLET    TAKE 1 TABLET(10 MG) BY MOUTH DAILY   ASPIRIN  81 MG TABLET    Take 81 mg by mouth daily.   CETIRIZINE (ZYRTEC) 10 MG TABLET    Take 10 mg by mouth daily in the afternoon.   CINACALCET (SENSIPAR) 30 MG TABLET    Take 30 mg by mouth daily.   ESCITALOPRAM  (LEXAPRO ) 10 MG TABLET    Take 10 mg by mouth daily.   IBUPROFEN  (ADVIL ) 600 MG TABLET    Take 1 tablet (600 mg total) by mouth 4 (four) times daily.   JARDIANCE  10 MG TABS TABLET    TAKE 1 TABLET(10 MG) BY MOUTH DAILY BEFORE BREAKFAST   LOSARTAN  (COZAAR ) 100 MG TABLET    TAKE  1 TABLET(100 MG) BY MOUTH DAILY   NITROGLYCERIN (NITROSTAT) 0.4 MG SL TABLET    Place 0.4 mg under the tongue every 5 (five) minutes as needed for chest pain.   PROPRANOLOL  (INDERAL ) 10 MG TABLET    TAKE 1 TABLET(10 MG) BY MOUTH DAILY   PYRIDOXINE  (VITAMIN B-6) 50 MG TABLET    Take 50 mg by mouth daily.     RISPERIDONE  (RISPERDAL ) 2 MG TABLET    Take 1 mg by mouth at bedtime.   ROSUVASTATIN  (CRESTOR )  40 MG TABLET    Take  1 tablet   Daily  for Cholesterol   TAMSULOSIN  (FLOMAX ) 0.4 MG CAPS CAPSULE    Take 1 capsule (0.4 mg total) by mouth at bedtime.   ZOLPIDEM  (AMBIEN ) 10 MG TABLET    Take 10 mg by mouth at bedtime as needed for sleep.  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   Arthritis    Axillary abscess    left   CAD (coronary artery disease)    Diverticulitis, colon    History of colonic polyps    HTN (hypertension)    Hyperglycemia    diet controlled   Hyperlipidemia    Pre-diabetes    Radiation proctitis    with chronic bleeding    Past Surgical History Past Surgical History:  Procedure Laterality Date   COLONOSCOPY     COLONOSCOPY W/ POLYPECTOMY     CORONARY ANGIOPLASTY WITH STENT PLACEMENT  04/2008   IR RADIOLOGIST EVAL & MGMT  07/22/2021   IR RADIOLOGIST EVAL & MGMT  10/20/2021   LAPAROSCOPIC APPENDECTOMY N/A 12/25/2022   Procedure: APPENDECTOMY LAPAROSCOPIC AND DRAINAGE OF INTRABDOMINAL ABSCESS;  Surgeon: Teresa Lonni HERO, MD;  Location: [facility];  Service: General;  Laterality: N/A;   MASS EXCISION Left 10/06/2017   Procedure: EXCISION LEFT AXILLA CYST;  Surgeon: Vanderbilt Ned, MD;  Location: [facility];  Service: General;  Laterality: Left;   TOTAL KNEE ARTHROPLASTY Right 09/06/2022   Procedure: RIGHT TOTAL KNEE ARTHROPLASTY;  Surgeon: Liam Lerner, MD;  Location: [facility];  Service: Orthopedics;  Laterality: Right;    Family History family history includes Heart disease in his father; Hypertension in his father; Hypotension in his mother; Kidney disease in his brother.  Social History Social History   Socioeconomic History   Marital status: Married    Spouse name: Not on file   Number of children: 2   Years of  education: Not on file   Highest education level: Not on file  Occupational History   Occupation: retired    Associate Professor: Retired  Tobacco Use   Smoking status: Former    Current packs/day: 0.00    Types: Cigarettes    Quit date: 11/22/1992    Years since quitting: 31.7   Smokeless tobacco: Never  Vaping Use   Vaping status: Never Used  Substance and Sexual Activity   Alcohol  use: No   Drug use: No   Sexual activity: Not Currently  Other Topics Concern   Not on file  Social History Narrative   Not on file   Social Drivers of Health   Financial Resource Strain: Not on file  Food Insecurity: No Food Insecurity (12/25/2022)   Hunger Vital Sign    Worried About Running  Out of Food in the Last Year: Never true    Ran Out of Food in the Last Year: Never true  Transportation Needs: No Transportation Needs (12/25/2022)   PRAPARE - Administrator, Civil Service (Medical): No    Lack of Transportation (Non-Medical): No  Physical Activity: Not on file  Stress: Not on file  Social Connections: Not on file  Intimate Partner Violence: Not At Risk (12/25/2022)   Humiliation, Afraid, Rape, and Kick questionnaire    Fear of Current or Ex-Partner: No    Emotionally Abused: No    Physically Abused: No    Sexually Abused: No    Lab Results  Component Value Date   CHOL 135 03/12/2024   Lab Results  Component Value Date   HDL 48.50 03/12/2024   Lab Results  Component Value Date   LDLCALC 78 03/12/2024   Lab Results  Component Value Date   TRIG 45.0 03/12/2024   Lab Results  Component Value Date   CHOLHDL 3 03/12/2024   Lab Results  Component Value Date   CREATININE 1.73 (H) 08/16/2024   Lab Results  Component Value Date   GFR 37.30 (L) 06/04/2024      Component Value Date/Time   NA 140 08/16/2024 1031   NA 139 04/06/2021 1121   K 4.1 08/16/2024 1031   CL 107 08/16/2024 1031   CO2 25 08/16/2024 1031   GLUCOSE 101 (H) 08/16/2024 1031   BUN 15 08/16/2024 1031    BUN 18 04/06/2021 1121   CREATININE 1.73 (H) 08/16/2024 1031   CALCIUM  10.3 08/16/2024 1031   PROT 7.5 03/12/2024 1143   PROT 7.2 01/02/2021 0934   ALBUMIN 4.9 03/12/2024 1143   ALBUMIN 4.7 01/02/2021 0934   AST 22 03/12/2024 1143   ALT 14 03/12/2024 1143   ALKPHOS 76 03/12/2024 1143   BILITOT 0.7 03/12/2024 1143   BILITOT 0.4 01/02/2021 0934   GFRNONAA 41 (L) 09/22/2023 1540   GFRNONAA 42 (L) 05/13/2021 0959   GFRAA 49 (L) 05/13/2021 0959      Latest Ref Rng & Units 08/16/2024   10:31 AM 06/04/2024   10:10 AM 03/12/2024   11:43 AM  BMP  Glucose 65 - 99 mg/dL 898  96  889   BUN 7 - 25 mg/dL 15  16  18    Creatinine 0.70 - 1.22 mg/dL 8.26  8.31  8.49   BUN/Creat Ratio 6 - 22 (calc) 9     Sodium 135 - 146 mmol/L 140  142  141   Potassium 3.5 - 5.3 mmol/L 4.1  4.2  3.7   Chloride 98 - 110 mmol/L 107  105  104   CO2 20 - 32 mmol/L 25  29  26    Calcium  8.6 - 10.3 mg/dL 89.6  9.3  8.9        Component Value Date/Time   WBC 5.7 03/12/2024 1143   RBC 5.67 03/12/2024 1143   HGB 14.7 03/12/2024 1143   HCT 45.0 03/12/2024 1143   PLT 190.0 03/12/2024 1143   MCV 79.3 03/12/2024 1143   MCH 25.0 (L) 09/22/2023 1540   MCHC 32.7 03/12/2024 1143   RDW 15.8 (H) 03/12/2024 1143   LYMPHSABS 1,923 06/20/2023 1114   MONOABS 1.0 12/27/2022 0058   EOSABS 173 09/21/2023 1151   BASOSABS 19 09/21/2023 1151   Lab Results  Component Value Date   TSH 3.55 03/12/2024   TSH 3.09 06/20/2023   TSH 2.22 11/25/2022  Parts of this note may have been dictated using voice recognition software. There may be variances in spelling and vocabulary which are unintentional. Not all errors are proofread. Please notify the dino if any discrepancies are noted or if the meaning of any statement is not clear.

## 2024-09-07 ENCOUNTER — Other Ambulatory Visit: Payer: Self-pay | Admitting: Nurse Practitioner

## 2024-11-30 ENCOUNTER — Telehealth: Payer: Self-pay

## 2024-11-30 ENCOUNTER — Ambulatory Visit

## 2024-11-30 VITALS — BP 122/62 | HR 60 | Ht 67.5 in | Wt 187.8 lb

## 2024-11-30 DIAGNOSIS — Z0000 Encounter for general adult medical examination without abnormal findings: Secondary | ICD-10-CM

## 2024-11-30 MED ORDER — ZOLPIDEM TARTRATE 10 MG PO TABS: 10.0000 mg | ORAL_TABLET | Freq: Every evening | ORAL | 2 refills | Status: AC | PRN

## 2024-11-30 NOTE — Telephone Encounter (Signed)
 Called pt. No answer and Unable to leave voicemail.

## 2024-11-30 NOTE — Addendum Note (Signed)
 Addended by: WENDEE LYNWOOD HERO on: 11/30/2024 01:31 PM   Modules accepted: Orders

## 2024-11-30 NOTE — Patient Instructions (Addendum)
 [name] Colon,  Thank you for taking the time for your Medicare Wellness Visit. I appreciate your continued commitment to your health goals. Please review the care plan we discussed, and feel free to reach out if I can assist you further.  Please note that Annual Wellness Visits do not include a physical exam. Some assessments may be limited, especially if the visit was conducted virtually. If needed, we may recommend an in-person follow-up with your provider.  Ongoing Care Seeing your primary care provider every 3 to 6 months helps us  monitor your health and provide consistent, personalized care.   Referrals If a referral was made during today's visit and you haven't received any updates within two weeks, please contact the referred provider directly to check on the status.  Recommended Screenings:  Health Maintenance  Topic Date Due   Eye exam for diabetics  04/27/2018   Complete foot exam   06/19/2024   Hemoglobin A1C  09/11/2024   Zoster (Shingles) Vaccine (1 of 2) 03/12/2025*   COVID-19 Vaccine (10 - Moderna risk 2025-26 season) 02/12/2025   Yearly kidney health urinalysis for diabetes  06/04/2025   Yearly kidney function blood test for diabetes  08/16/2025   Medicare Annual Wellness Visit  11/30/2025   DTaP/Tdap/Td vaccine (5 - Td or Tdap) 03/17/2027   Pneumococcal Vaccine for age over 17  Completed   Flu Shot  Completed   Meningitis B Vaccine  Aged Out   Colon Cancer Screening  Discontinued  *Topic was postponed. The date shown is not the original due date.       11/30/2024    9:54 AM  Advanced Directives  Does Patient Have a Medical Advance Directive? No  Would patient like information on creating a medical advance directive? Yes (MAU/Ambulatory/Procedural Areas - Information given)    Vision: Annual vision screenings are recommended for early detection of glaucoma, cataracts, and diabetic retinopathy. These exams can also reveal signs of chronic conditions such as  diabetes and high blood pressure.  Dental: Annual dental screenings help detect early signs of oral cancer, gum disease, and other conditions linked to overall health, including heart disease and diabetes.

## 2024-11-30 NOTE — Telephone Encounter (Signed)
 Refill has been provided.

## 2024-11-30 NOTE — Progress Notes (Signed)
 "  Chief Complaint  Patient presents with   Medicare Wellness     Subjective:   [name] Colon is a 84 y.o. male who presents for a Medicare Annual Wellness Visit.  Visit info / Clinical Intake: Medicare Wellness Visit Type:: Subsequent Annual Wellness Visit Persons participating in visit and providing information:: patient Medicare Wellness Visit Mode:: In-person (required for WTM) Interpreter Needed?: No Pre-visit prep was completed: yes AWV questionnaire completed by patient prior to visit?: no Living arrangements:: lives with spouse/significant other Patient's Overall Health Status Rating: good Typical amount of pain: none Does pain affect daily life?: no Are you currently prescribed opioids?: no  Dietary Habits and Nutritional Risks How many meals a day?: 2 Eats fruit and vegetables daily?: yes Most meals are obtained by: preparing own meals In the last 2 weeks, have you had any of the following?: none Diabetic:: (!) yes Any non-healing wounds?: no How often do you check your BS?: 0 Would you like to be referred to a Nutritionist or for Diabetic Management? : no  Functional Status Activities of Daily Living (to include ambulation/medication): Independent Ambulation: Independent with device- listed below Home Assistive Devices/Equipment: Eyeglasses; Dentures (specify type) Medication Administration: Independent Home Management (perform basic housework or laundry): Independent Manage your own finances?: yes Primary transportation is: driving Concerns about vision?: no *vision screening is required for WTM* Concerns about hearing?: (!) yes Uses hearing aids?: (!) yes Hear whispered voice?: (!) no *in-person visit only*  Fall Screening Falls in the past year?: 0 Number of falls in past year: 0 Was there an injury with Fall?: 0 Fall Risk Category Calculator: 0 Patient Fall Risk Level: Low Fall Risk  Fall Risk Patient at Risk for Falls Due to: No Fall  Risks Fall risk Follow up: Falls evaluation completed; Falls prevention discussed  Home and Transportation Safety: All rugs have non-skid backing?: N/A, no rugs All stairs or steps have railings?: yes Grab bars in the bathtub or shower?: yes Have non-skid surface in bathtub or shower?: yes Good home lighting?: yes Regular seat belt use?: yes Hospital stays in the last year:: no  Cognitive Assessment Difficulty concentrating, remembering, or making decisions? : no Will 6CIT or Mini Cog be Completed: yes What year is it?: 0 points What month is it?: 0 points Give patient an address phrase to remember (5 components): [address] [place], [place] About what time is it?: 0 points Count backwards from 20 to 1: 0 points Say the months of the year in reverse: 0 points Repeat the address phrase from earlier: 2 points (Drive) 6 CIT Score: 2 points  Advance Directives (For Healthcare) Does Patient Have a Medical Advance Directive?: No Would patient like information on creating a medical advance directive?: Yes (MAU/Ambulatory/Procedural Areas - Information given)  Reviewed/Updated  Reviewed/Updated: Reviewed All (Medical, Surgical, Family, Medications, Allergies, Care Teams, Patient Goals)    Allergies (verified) Patient has no active allergies.   Current Medications (verified) Outpatient Encounter Medications as of 11/30/2024  Medication Sig   amLODipine  (NORVASC ) 10 MG tablet TAKE 1 TABLET(10 MG) BY MOUTH DAILY   aspirin  81 MG tablet Take 81 mg by mouth daily.   cetirizine (ZYRTEC) 10 MG tablet Take 10 mg by mouth daily in the afternoon.   cinacalcet (SENSIPAR) 30 MG tablet Take 30 mg by mouth daily.   ibuprofen  (ADVIL ) 600 MG tablet Take 1 tablet (600 mg total) by mouth 4 (four) times daily.   JARDIANCE  10 MG TABS tablet TAKE 1 TABLET(10 MG)  BY MOUTH DAILY BEFORE BREAKFAST   losartan  (COZAAR ) 100 MG tablet TAKE 1 TABLET(100 MG) BY MOUTH DAILY   nitroGLYCERIN (NITROSTAT) 0.4 MG SL  tablet Place 0.4 mg under the tongue every 5 (five) minutes as needed for chest pain.   propranolol  (INDERAL ) 10 MG tablet TAKE 1 TABLET(10 MG) BY MOUTH DAILY   pyridOXINE  (VITAMIN B-6) 50 MG tablet Take 50 mg by mouth daily.     risperiDONE  (RISPERDAL ) 2 MG tablet Take 1 mg by mouth at bedtime.   rosuvastatin  (CRESTOR ) 40 MG tablet Take  1 tablet   Daily  for Cholesterol   tamsulosin  (FLOMAX ) 0.4 MG CAPS capsule TAKE 1 CAPSULE(0.4 MG) BY MOUTH AT BEDTIME   zolpidem  (AMBIEN ) 10 MG tablet Take 10 mg by mouth at bedtime as needed for sleep.   escitalopram  (LEXAPRO ) 10 MG tablet Take 10 mg by mouth daily. (Patient not taking: Reported on 11/30/2024)   No facility-administered encounter medications on file as of 11/30/2024.    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   Arthritis    Axillary abscess    left   CAD (coronary artery disease)    Diverticulitis, colon    History of colonic polyps    HTN (hypertension)    Hyperglycemia    diet controlled   Hyperlipidemia    Pre-diabetes    Radiation proctitis    with chronic bleeding   Past Surgical History:  Procedure Laterality Date   COLONOSCOPY     COLONOSCOPY W/ POLYPECTOMY     CORONARY ANGIOPLASTY WITH STENT PLACEMENT  04/2008   IR RADIOLOGIST EVAL & MGMT  07/22/2021   IR RADIOLOGIST EVAL & MGMT  10/20/2021   LAPAROSCOPIC APPENDECTOMY N/A 12/25/2022   Procedure: APPENDECTOMY LAPAROSCOPIC AND DRAINAGE OF INTRABDOMINAL ABSCESS;  Surgeon: Teresa Lonni HERO, MD;  Location: [facility];  Service: General;  Laterality: N/A;   MASS EXCISION Left 10/06/2017   Procedure: EXCISION LEFT AXILLA CYST;  Surgeon: Vanderbilt Ned, MD;  Location: [facility];  Service: General;  Laterality: Left;   TOTAL KNEE ARTHROPLASTY Right 09/06/2022   Procedure: RIGHT TOTAL KNEE ARTHROPLASTY;  Surgeon: Liam Lerner, MD;  Location: [facility];  Service: Orthopedics;  Laterality: Right;    Family History  Problem Relation Age of Onset   Heart disease Father    Hypertension Father    Hypotension Mother    Kidney disease Brother    Colon cancer Neg Hx    Social History   Occupational History   Occupation: retired    Associate Professor: Retired  Tobacco Use   Smoking status: Former    Current packs/day: 0.00    Types: Cigarettes    Quit date: 11/22/1992    Years since quitting: 32.0   Smokeless tobacco: Never  Vaping Use   Vaping status: Never Used  Substance and Sexual Activity   Alcohol  use: No   Drug use: No   Sexual activity: Not Currently   Tobacco Counseling Counseling given: Yes  SDOH Screenings   Food Insecurity: No Food Insecurity (11/30/2024)  Housing: Unknown (11/30/2024)  Transportation Needs: No Transportation Needs (11/30/2024)  Utilities: Not At Risk (11/30/2024)  Depression (PHQ2-9): Low Risk (11/30/2024)  Physical Activity: Inactive (11/30/2024)  Social Connections: Socially Integrated (11/30/2024)  Stress: No Stress Concern Present (11/30/2024)  Tobacco Use: Medium Risk (11/30/2024)  Health Literacy: Adequate Health Literacy (11/30/2024)   See flowsheets for full screening details  Depression Screen PHQ 2 &  9 Depression Scale- Over the past 2 weeks, how often have you been bothered by any of the following problems? Little interest or pleasure in doing things: 0 Feeling down, depressed, or hopeless (PHQ Adolescent also includes...irritable): 0 PHQ-2 Total Score: 0 Trouble falling or staying asleep, or sleeping too much: 0 Feeling tired or having little energy: 0 Poor appetite or overeating (PHQ Adolescent also includes...weight loss): 0 Feeling bad about yourself - or that you are a failure or have let yourself or your family down: 0 Trouble concentrating on things, such as reading the newspaper or watching television (PHQ Adolescent also includes...like school work): 0 Moving or speaking so slowly that other people could have noticed. Or the opposite - being  so fidgety or restless that you have been moving around a lot more than usual: 3 (rt knee concerns) Thoughts that you would be better off dead, or of hurting yourself in some way: 0 PHQ-9 Total Score: 3 If you checked off any problems, how difficult have these problems made it for you to do your work, take care of things at home, or get along with other people?: Not difficult at all  Depression Treatment Depression Interventions/Treatment : Medication; Currently on Treatment; PHQ2-9 Score <4 Follow-up Not Indicated     Goals Addressed               This Visit's Progress     Patient Stated (pt-stated)        Patient stated he plans to continue being active              Objective:    Today's Vitals   11/30/24 0953  BP: 122/62  Pulse: 60  SpO2: 98%  Weight: 187 lb 12.8 oz (85.2 kg)  Height: 5' 7.5 (1.715 m)   Body mass index is 28.98 kg/m.  Hearing/Vision screen Hearing Screening - Comments:: Wears hearing aids Vision Screening - Comments:: Wears rx glasses - up to date with routine eye exams with an Optometrist Immunizations and Health Maintenance Health Maintenance  Topic Date Due   OPHTHALMOLOGY EXAM  04/27/2018   FOOT EXAM  06/19/2024   COVID-19 Vaccine (6 - 2025-26 season) 07/23/2024   HEMOGLOBIN A1C  09/11/2024   Zoster Vaccines- Shingrix (1 of 2) 03/12/2025 (Originally 09/27/1960)   Diabetic kidney evaluation - Urine ACR  06/04/2025   Diabetic kidney evaluation - eGFR measurement  08/16/2025   Medicare Annual Wellness (AWV)  11/30/2025   DTaP/Tdap/Td (5 - Td or Tdap) 03/17/2027   Pneumococcal Vaccine: 50+ Years  Completed   Influenza Vaccine  Completed   Meningococcal B Vaccine  Aged Out   Colonoscopy  Discontinued        Assessment/Plan:  This is a routine wellness examination for [name] Colon.  Patient Care Team: Wendee Lynwood HERO, NP as PCP - General (Nurse Practitioner) Lavona Lynwood, MD as PCP - Cardiology (Cardiology) Administration, Veterans as  PCP - Internal Medicine Lavona Lynwood, MD as Consulting Physician (Cardiology) Abran Norleen SAILOR, MD as Consulting Physician (Gastroenterology) Ceil Anes, MD (Inactive) as Consulting Physician (Urology)  I have personally reviewed and noted the following in the patients chart:   Medical and social history Use of alcohol , tobacco or illicit drugs  Current medications and supplements including opioid prescriptions. Functional ability and status Nutritional status Physical activity Advanced directives List of other physicians Hospitalizations, surgeries, and ER visits in previous 12 months Vitals Screenings to include cognitive, depression, and falls Referrals and appointments  No orders of the defined types were  placed in this encounter.  In addition, I have reviewed and discussed with patient certain preventive protocols, quality metrics, and best practice recommendations. A written personalized care plan for preventive services as well as general preventive health recommendations were provided to patient.   [name] Colon, CMA   11/30/2024   Return in 1 year (on 11/30/2025).  After Visit Summary: (In Person-Declined) Patient declined AVS at this time.  Nurse Notes: scheduled 2027 AWV appt "

## 2024-11-30 NOTE — Telephone Encounter (Signed)
 requesting a refill on Zolpidem  10mg  to [facility] ([address] in [place]) - Please call the pt

## 2024-12-05 NOTE — Telephone Encounter (Signed)
 Mail box full not able to leave message. I have called pharmacy patient has not picked up but it is ready for him.

## 2024-12-06 NOTE — Telephone Encounter (Signed)
 Attempted to contact pt. There was no answer and his VM is still full.

## 2024-12-12 ENCOUNTER — Other Ambulatory Visit: Payer: Self-pay | Admitting: Nurse Practitioner

## 2024-12-14 ENCOUNTER — Telehealth: Payer: Self-pay | Admitting: Nurse Practitioner

## 2024-12-14 MED ORDER — ROSUVASTATIN CALCIUM 40 MG PO TABS: ORAL_TABLET | ORAL | 3 refills | Status: AC

## 2024-12-14 MED ORDER — LOSARTAN POTASSIUM 100 MG PO TABS: ORAL_TABLET | ORAL | 2 refills | Status: AC

## 2024-12-14 NOTE — Telephone Encounter (Signed)
 Medications sent in

## 2024-12-14 NOTE — Telephone Encounter (Signed)
 Prescription Request  12/14/2024  LOV: 06/04/2024  What is the name of the medication or equipment?   losartan  (COZAAR ) 100 MG tablet   rosuvastatin  (CRESTOR ) 40 MG tablet   Have you contacted your pharmacy to request a refill? No   Which pharmacy would you like this sent to?  [id number] - [place], [place] - [address] AT [facility] [address] [place] [address] Phone: [phone] Fax: [phone]    Patient notified that their request is being sent to the clinical staff for review and that they should receive a response within 2 business days.   Please advise at Mobile [phone] (mobile)

## 2024-12-14 NOTE — Telephone Encounter (Signed)
 Called pt. No answer and Unable to leave voicemail.

## 2025-01-01 ENCOUNTER — Ambulatory Visit: Admitting: Nurse Practitioner

## 2025-02-13 ENCOUNTER — Other Ambulatory Visit

## 2025-02-18 ENCOUNTER — Ambulatory Visit: Admitting: "Endocrinology
